# Patient Record
Sex: Male | Born: 1980 | Race: White | Hispanic: No | Marital: Married | Smoking: Former smoker
Health system: Southern US, Community
[De-identification: ages and names within clinical notes are randomized; demographics above are authoritative.]

## PROBLEM LIST (undated history)

## (undated) DIAGNOSIS — J9612 Chronic respiratory failure with hypercapnia: Secondary | ICD-10-CM

## (undated) DIAGNOSIS — I509 Heart failure, unspecified: Secondary | ICD-10-CM

## (undated) DIAGNOSIS — J984 Other disorders of lung: Secondary | ICD-10-CM

## (undated) DIAGNOSIS — Q676 Pectus excavatum: Secondary | ICD-10-CM

## (undated) HISTORY — DX: Chronic respiratory failure with hypercapnia: J96.12

## (undated) HISTORY — PX: PECTUS EXCAVATUM REPAIR: SHX437

## (undated) HISTORY — PX: KNEE SURGERY: SHX244

## (undated) HISTORY — DX: Heart failure, unspecified: I50.9

## (undated) HISTORY — DX: Pectus excavatum: Q67.6

## (undated) HISTORY — DX: Other disorders of lung: J98.4

---
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
## 2009-02-13 ENCOUNTER — Inpatient Hospital Stay (HOSPITAL_COMMUNITY): Admission: EM | Admit: 2009-02-13 | Discharge: 2009-02-15 | Payer: Self-pay | Admitting: Emergency Medicine

## 2009-02-13 ENCOUNTER — Ambulatory Visit: Payer: Self-pay | Admitting: Cardiology

## 2009-02-13 ENCOUNTER — Ambulatory Visit: Payer: Self-pay | Admitting: Pulmonary Disease

## 2009-02-14 ENCOUNTER — Encounter (INDEPENDENT_AMBULATORY_CARE_PROVIDER_SITE_OTHER): Payer: Self-pay | Admitting: Internal Medicine

## 2009-02-15 ENCOUNTER — Encounter: Payer: Self-pay | Admitting: Pulmonary Disease

## 2009-02-20 ENCOUNTER — Ambulatory Visit (HOSPITAL_COMMUNITY): Admission: RE | Admit: 2009-02-20 | Discharge: 2009-02-20 | Payer: Self-pay | Admitting: Internal Medicine

## 2009-02-20 ENCOUNTER — Ambulatory Visit: Payer: Self-pay | Admitting: Cardiology

## 2009-02-23 ENCOUNTER — Telehealth: Payer: Self-pay | Admitting: Cardiology

## 2009-02-23 DIAGNOSIS — J4540 Moderate persistent asthma, uncomplicated: Secondary | ICD-10-CM

## 2009-02-24 ENCOUNTER — Ambulatory Visit: Payer: Self-pay | Admitting: Cardiology

## 2009-02-24 DIAGNOSIS — I272 Pulmonary hypertension, unspecified: Secondary | ICD-10-CM

## 2009-02-24 DIAGNOSIS — R079 Chest pain, unspecified: Secondary | ICD-10-CM | POA: Insufficient documentation

## 2009-02-24 DIAGNOSIS — I509 Heart failure, unspecified: Secondary | ICD-10-CM

## 2009-02-24 DIAGNOSIS — R0902 Hypoxemia: Secondary | ICD-10-CM

## 2009-03-03 ENCOUNTER — Ambulatory Visit: Payer: Self-pay | Admitting: Pulmonary Disease

## 2009-03-03 DIAGNOSIS — J9610 Chronic respiratory failure, unspecified whether with hypoxia or hypercapnia: Secondary | ICD-10-CM | POA: Insufficient documentation

## 2009-03-05 DIAGNOSIS — J984 Other disorders of lung: Secondary | ICD-10-CM

## 2009-03-16 ENCOUNTER — Ambulatory Visit: Payer: Self-pay | Admitting: Pulmonary Disease

## 2009-04-07 ENCOUNTER — Telehealth: Payer: Self-pay | Admitting: Cardiology

## 2009-05-10 ENCOUNTER — Ambulatory Visit: Payer: Self-pay | Admitting: Cardiology

## 2009-05-11 LAB — CONVERTED CEMR LAB
BUN: 13 mg/dL (ref 6–23)
CO2: 35 meq/L — ABNORMAL HIGH (ref 19–32)
Calcium: 8.7 mg/dL (ref 8.4–10.5)
Chloride: 98 meq/L (ref 96–112)
Creatinine, Ser: 0.7 mg/dL (ref 0.4–1.5)
GFR calc non Af Amer: 141.48 mL/min (ref >60)
Glucose, Bld: 93 mg/dL (ref 70–99)
Potassium: 3.6 meq/L (ref 3.5–5.1)
Pro B Natriuretic peptide (BNP): 9 pg/mL (ref 0.0–100.0)
Sodium: 139 meq/L (ref 135–145)

## 2009-06-20 ENCOUNTER — Telehealth: Payer: Self-pay | Admitting: Pulmonary Disease

## 2009-06-28 ENCOUNTER — Encounter: Payer: Self-pay | Admitting: Pulmonary Disease

## 2009-06-28 ENCOUNTER — Telehealth: Payer: Self-pay | Admitting: Pulmonary Disease

## 2009-09-14 ENCOUNTER — Encounter (INDEPENDENT_AMBULATORY_CARE_PROVIDER_SITE_OTHER): Payer: Self-pay | Admitting: *Deleted

## 2009-10-25 ENCOUNTER — Encounter: Payer: Self-pay | Admitting: Cardiology

## 2010-03-03 ENCOUNTER — Encounter: Payer: Self-pay | Admitting: Cardiology

## 2010-04-03 ENCOUNTER — Encounter: Payer: Self-pay | Admitting: Cardiology

## 2010-06-12 ENCOUNTER — Ambulatory Visit: Payer: Self-pay | Admitting: Cardiovascular Disease

## 2010-06-12 ENCOUNTER — Observation Stay (HOSPITAL_COMMUNITY): Admission: EM | Admit: 2010-06-12 | Discharge: 2010-06-13 | Payer: Self-pay | Admitting: Emergency Medicine

## 2010-06-12 ENCOUNTER — Ambulatory Visit: Payer: Self-pay | Admitting: Pulmonary Disease

## 2010-06-13 ENCOUNTER — Telehealth (INDEPENDENT_AMBULATORY_CARE_PROVIDER_SITE_OTHER): Payer: Self-pay | Admitting: *Deleted

## 2010-06-20 ENCOUNTER — Ambulatory Visit: Payer: Self-pay | Admitting: Pulmonary Disease

## 2010-07-16 ENCOUNTER — Ambulatory Visit: Payer: Self-pay | Admitting: Cardiology

## 2010-07-16 ENCOUNTER — Encounter: Payer: Self-pay | Admitting: Cardiology

## 2010-07-16 ENCOUNTER — Ambulatory Visit: Payer: Self-pay

## 2010-07-16 ENCOUNTER — Ambulatory Visit (HOSPITAL_COMMUNITY): Admission: RE | Admit: 2010-07-16 | Discharge: 2010-07-16 | Payer: Self-pay | Admitting: Cardiology

## 2010-08-21 ENCOUNTER — Ambulatory Visit: Payer: Self-pay | Admitting: Pulmonary Disease

## 2010-09-13 ENCOUNTER — Telehealth: Payer: Self-pay | Admitting: Pulmonary Disease

## 2010-09-30 ENCOUNTER — Encounter: Payer: Self-pay | Admitting: Family Medicine

## 2010-10-02 ENCOUNTER — Encounter: Payer: Self-pay | Admitting: Cardiology

## 2010-10-02 ENCOUNTER — Telehealth (INDEPENDENT_AMBULATORY_CARE_PROVIDER_SITE_OTHER): Payer: Self-pay | Admitting: *Deleted

## 2010-10-07 LAB — CONVERTED CEMR LAB
BUN: 13 mg/dL (ref 6–23)
CO2: 35 meq/L — ABNORMAL HIGH (ref 19–32)
Calcium: 8.7 mg/dL (ref 8.4–10.5)
Chloride: 100 meq/L (ref 96–112)
Creatinine, Ser: 0.8 mg/dL (ref 0.4–1.5)
GFR calc non Af Amer: 121.45 mL/min (ref >60)
Glucose, Bld: 96 mg/dL (ref 70–99)
Potassium: 3.7 meq/L (ref 3.5–5.1)
Sodium: 141 meq/L (ref 135–145)

## 2010-10-11 NOTE — Progress Notes (Signed)
Summary: HFU--needs ov in 1 week---appt for 06/20/2010  Phone Note Call from Patient Call back at [facility] Phone [phone]   Caller: Patient Call For: clance Summary of Call: pt needs HFU w/ kc in 1 wk. please advise.  Initial call taken by: Tivis Ringer, CNA,  June 13, 2010 5:26 PM  Follow-up for Phone Call        [facility], pt was recently hospitalized Oct 4 to Oct 5. D/c summary in E-chart state..... DISCHARGE DIAGNOSIS:  Acute asthma exacerbation.   SECONDARY DIAGNOSES:   1. Viral bronchitis.   2. Chronic right-sided congestive heart failure.   3. Mild pulmonary hypertension.   4. History of pectus excavatum,   5. 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  *****Pt needs 1 week f/u appt with you.  No appts avail. You are completely booked up until Oct 28. Please advise.  Thank you.  Aundra Millet Reynolds LPN  June 14, 2010 9:19 AM   Additional Follow-up for Phone Call Additional follow up Details #1::        we can add him on to the afternoon slots? preferrably wed afternoon. Additional Follow-up by: Barbaraann Share MD,  June 14, 2010 4:32 PM    Additional Follow-up for Phone Call Additional follow up Details #2::    scheduled pt for wed 06/20/2010 at 4:15pm..  LMOM informing pt of appt date and time.  Aundra Millet Reynolds LPN  June 14, 2010 4:35 PM

## 2010-10-11 NOTE — Progress Notes (Signed)
Summary: question regarding oxygen  Phone Note Call from Patient Call back at Home Phone [phone]   Caller: Patient Call For: Dr. Shelle Iron Summary of Call: Patient phoned and stated that he just missed a call from [facility]. He can be reached at [phone] Initial call taken by: Vedia Coffer,  September 13, 2010 5:10 PM  Follow-up for Phone Call        called and spoke with pt.  informed him of ONO results and to stay on o2 at 2LPM at night with bipap.  pt verbalized understanding and agreed to do this.  However, pt is concerned about having to pay $200 each month for oxygen tanks and wanted to ask [facility] if he will have to be on the oxygen forever as he cannot continue to pay $200 indefinitely.  Please advise.  Thanks.  Aundra Millet Reynolds LPN  September 13, 2010 5:14 PM   *** pt said we could call him back tomorrow with KC's response  Additional Follow-up for Phone Call Additional follow up Details #1::        I think there is a good chance he may be on this forever due to his restrictive disease.  He may be able to tell dme he doesn;t want backup tanks in his house, and refuse to accept them...they may let him sign a waiver.  If not, let us know Additional Follow-up by: Barbaraann Share MD,  September 13, 2010 5:20 PM    Additional Follow-up for Phone Call Additional follow up Details #2::    Pt informed of above and states he will check into it with the DME or change suppliers if needed. States they told him it was mostly for the concentrator but will check for a breakdown of the supplies. Zackery Barefoot CMA  September 13, 2010 5:30 PM

## 2010-10-11 NOTE — Letter (Signed)
Summary: [facility] Note - Cardiovascular   [facility] Note - Cardiovascular   Imported By: Roderic Ovens 04/20/2010 12:33:19  _____________________________________________________________________  External Attachment:    Type:   Image     Comment:   External Document

## 2010-10-11 NOTE — Consult Note (Signed)
Summary: Consultation Report  Consultation Report   Imported By: [place] Carbo 03/21/2010 08:37:03  _____________________________________________________________________  External Attachment:    Type:   Image     Comment:   External Document

## 2010-10-11 NOTE — Letter (Signed)
Summary: Appointment - Reminder 2  [employer], Main Office  [address]   [place], [place] [address]   Phone: [phone]  Fax: [phone]     September 14, 2009 MRN: [id number]   LINSEY HIROTA [address] [place], [place]  [address]   Dear Mr. Mataya,  Our records indicate that it is time to schedule a follow-up appointment with Dr. Shirlee Latch. It is very important that we reach you to schedule this appointment. We look forward to participating in your health care needs. Please contact us at the number listed above at your earliest convenience to schedule your appointment.  If you are unable to make an appointment at this time, give [place] a call so we can update our records.  Sincerely,   Migdalia Dk [facility] Scheduling Team

## 2010-10-11 NOTE — Progress Notes (Signed)
  Records faxed to [facility] Center/Dr.Schocker @ [phone] [facility]  October 02, 2010 1:58 PM

## 2010-10-11 NOTE — Assessment & Plan Note (Signed)
Summary: EPH/JML   Visit Type:  Follow-up Referring Provider:  Shirlee Latch  CC:  Post-hospital.  History of Present Illness: 30 yo with history of pectus excavatum s/p repair in childhood and redo pectus repair in 6/11 returns to cardiology clinic after hospitalization recently for an asthma exacerbation.  Patient has been doing well since his surgery in 6/11.  He now has reasonably good exercise tolerance and was able to walk about a mile uphill to the stadium at the [facility] game this weekend. He feels like his breathing has been much better since the surgery.  He is working full time and doing pulmonary rehab at the hospital in [place].  He was hospitalized with acute bronchitis and asthma exacerbation in 10/11 but this resolved with steroid treatment.  He is following with Dr. Shelle Iron for Bipap given his chronic hypercarbic respiratory failure.  Morning headaches have resolved on Bipap.   Echo was done today and shows preserved LV systolic function with continued compression of the left atrium between the ascending and descending aorta.  The right ventricle is still moderately dilated with probably mild systolic dysfunction.   ECG:  NSR, LAE, Qs in V1 and V2, poor anterior R wave progression.  Labs (8/10): BNP 9, K 3.6, creatinine 0.7  Current Medications (verified): 1)  Furosemide 40 Mg Tabs (Furosemide) .... Once Daily 2)  Klor-Con M20 20 Meq Cr-Tabs (Potassium Chloride Crys Cr) .... Once Daily 3)  Isosorbide Mononitrate Cr 30 Mg Xr24h-Tab (Isosorbide Mononitrate) .Marland Kitchen.. 1 Once Daily 4)  Pulmicort Flexhaler 180 Mcg/act Aepb (Budesonide) .... 2  Puff Two Times A Day 5)  Metoprolol Tartrate 50 Mg Tabs (Metoprolol Tartrate) .... 1/2 Once Daily 6)  Proventil Hfa 108 (90 Base) Mcg/act Aers (Albuterol Sulfate) .... 2 Puffs Every 4-6 Hrs As Needed  Allergies (verified): No Known Drug Allergies  Past History:  Past Medical History: 1.  Pectus excavatum status post repair in [place] in  childhood and repeat repair at [facility] in 6/11.  Borderline Marfanoid habitus.  2.  Asthma.   3.  CHF:  Patient was admitted in 6/10 with CHF and volume overload.  He was diuresed with IV lasix and felt better.  Echo suggested that the RV was enlarged.  RHC was done (after several doses of IV lasix) showing mean RA 13, RV 38/12, PA 42/23 mean 34, mean PCWP 15.  PVR < 2 WU.  Shunt run showed no O2 step up.  Patient was hypoxic and was started O2.  Cardiac MRI (6/10) showed normal LV size and systolic function EF 62%, D-shaped IV septum, moderate to severe RV dilation with RV EF 37%. The pulmonic valve opened normally.  The heart was shifted leftward (pectus) with compression of the LA between the ascending and descending aorta.  MRA of the chest showed normal pulmonary veins.  CHF was primarily right-sided with mild pulmonary HTN noted.  He is hypoxic, especially at night, so hypoxic pulmonary vasoconstriction could be playing a role.  PCWP was only mildly elevated but I wonder if LA compression could be playing a role.  Repeat echo after pectus surgery in 11/11 showed normal LV systolic function and size with a D-shaped interventricular septum.  The LA was still compressed between ascending and descending aorta.  The RV was moderately dilated with mild systolic dysfunction.   4. Restrictive lung disease probably from compression of L lung. 5. 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  Patient is on Bipap.   Family History: Reviewed history from  02/23/2009 and no changes required. POS for HTN-Mother  Social History: The patient lives in [place] with his wife and son.   He works full time in [employer].   He drinks 1-6 drinks per week, never having more than 3 drinks in 1 evening.  No illicit drug use.  Patient states former smoker. started at age 76.  only smoked socially.  quit smoking at age 76.  Review of Systems       All systems reviewed and negative except  as per HPI.  Vital Signs:  Patient profile:   30 year old male Height:      76 inches Weight:      201 pounds BMI:     24.55 Pulse rate:   67 / minute Pulse rhythm:   regular BP sitting:   110 / 84  (left arm) Cuff size:   large  Vitals Entered By: Vikki Ports (July 16, 2010 10:49 AM)  Physical Exam  General:  Well developed, well nourished, in no acute distress.  Tall.  Neck:  Neck supple, no JVD. No masses, thyromegaly or abnormal cervical nodes. Lungs:  Rare rhonchi.  Heart:  Non-displaced PMI, chest non-tender; regular rate and rhythm, S1, S2 without murmurs, rubs or gallops. Carotid upstroke normal, no bruit. Pedals normal pulses. No edema, no varicosities. Abdomen:  Bowel sounds positive; abdomen soft and non-tender without masses, organomegaly, or hernias noted. No hepatosplenomegaly. Msk:  Pectus excavatum still evident.  No joint hyperextensibility or other sequelae of Marfans. Extremities:  No clubbing or cyanosis. Neurologic:  Alert and oriented x 3. Psych:  Normal affect.   Impression & Recommendations:  Problem # 1:  CONGESTIVE HEART FAILURE, UNSPECIFIED (ICD-428.0) Patient primarily right-sided CHF in 2010, likely due to pulmonary hypertension from a combination of left atrial compression and nocturnal hypoxemia. He is now using Bipap every night.  He had a redo pectus surgery and his symptoms are improved with minimal exertional dyspnea.  I suspect the surgery relieved some of the compression on his lungs but echo does show continued compression of the left atrium, likely between the ascending and descending aorta. The RV was difficult to fully visualized but I suspect that it was moderately dilated with mild systolic dysfunction.  He does not appear volume overloaded on exam.  - Continue Bipap - Continue current Lasix  - Repeat echo in 1-2 years depending on symptoms.   - Given asthma, stop metoprolol and start bisoprolol 2.5 mg daily.   Problem # 2:   PULMONARY HYPERTENSION (ICD-416.8) Patient likely had a combination of secondary pulmonary hypertension from LA compression as well as hypoxemic pulmonary vasoconstriction from nocturnal hypoxemia.   Problem # 3:  RESTRICTIVE LUNG DISEASE (ICD-518.89) Lung disease from restriction due to pectus.  Improvement in this may have led to improved symptoms after surgery.  He has chronic hypercarbic respiratory failure.   Patient Instructions: 1)  Your physician has recommended you make the following change in your medication:  2)  Stop Metoprolol.  3)  Start Bisoprolol  2.5mg  daily---this will be one-half of a 5mg  tablet. 4)  Your physician wants you to follow-up in:  1 year with Dr Shirlee Latch.You will receive a reminder letter in the mail two months in advance. If you don't receive a letter, please call our office to schedule the follow-up appointment. Prescriptions: BISOPROLOL FUMARATE 5 MG TABS (BISOPROLOL FUMARATE) one-half tablet daily  #15 x 11   Entered by:   Katina Dung, RN, BSN  Authorized by:   Marca Ancona, MD   Signed by:   Katina Dung, RN, BSN on 07/16/2010   Method used:   Electronically to        [id number] (retail)       [address]       [place], [place]  [address]       Ph: [phone] or [phone]       Fax: [phone]   RxID:   [id number]

## 2010-10-11 NOTE — Letter (Signed)
Summary: [facility]  [facility] Note   Imported By: Roderic Ovens 11/15/2009 14:48:33  _____________________________________________________________________  External Attachment:    Type:   Image     Comment:   External Document

## 2010-10-11 NOTE — Assessment & Plan Note (Signed)
Summary: post hospital followup after asthma exacerbation   Copy to:  Shirlee Latch Primary Provider/Referring Provider:  [facility]  CC:  Post hospital followup.  Pt states that his breathing has improved.  He states has had some prod cough with minimal clear sputum.Marland Kitchen  History of Present Illness: The pt comes in today for f/u after a recent hospitalization for acute bronchitis with asthma exacerbation.  He has not been seen in almost 1 1/2 years, and has known pectus deformity with chronic hypercarbic respiratory failure and pulmonary htn associated with compressed LA and probable nocturnal hypoxemia.  I had started him on bipap the last visit, and have not seen him since.  He ended up having surgery at [facility] which helped quite a bit.  He thinks that bipap has really helped in terms of sleep, his breathing, and has resolved his am headaches.  His breathing is slowly getting back to baseline.  He has finished his prednisone and abx, and cough is now minimal with no purulence.  He denies significant sob.  He has been wearing bipap religiously, and reports no issues with mask or pressure.  Current Medications (verified): 1)  Furosemide 40 Mg Tabs (Furosemide) .... Once Daily 2)  Klor-Con M20 20 Meq Cr-Tabs (Potassium Chloride Crys Cr) .... Once Daily 3)  Isosorbide Mononitrate Cr 30 Mg Xr24h-Tab (Isosorbide Mononitrate) .Marland Kitchen.. 1 Once Daily 4)  Pulmicort Flexhaler 180 Mcg/act Aepb (Budesonide) .Marland Kitchen.. 1 Puff Two Times A Day 5)  Metoprolol Tartrate 50 Mg Tabs (Metoprolol Tartrate) .... 1/2 Once Daily 6)  Proventil Hfa 108 (90 Base) Mcg/act Aers (Albuterol Sulfate) .... 2 Puffs Every 4-6 Hrs As Needed  Allergies (verified): No Known Drug Allergies  Past History:  Past medical, surgical, family and social histories (including risk factors) reviewed, and no changes noted (except as noted below).  Past Medical History: Reviewed history from 05/10/2009 and no changes required. 1.  Pectus  excavatum status post repair in [place].  Borderline Marfanoid habitus.  2.  Asthma.  Mild, has albuterol inhaler but does not use it.  3.  CHF:  Patient was admitted in 6/10 with CHF and volume overload.  He was diuresed with IV lasix and felt better.  Echo suggested that the RV was enlarged.  RHC was done (after several doses of IV lasix) showing mean RA 13, RV 38/12, PA 42/23 mean 34, mean PCWP 15.  PVR < 2 WU.  Shunt run showed no O2 step up.  Patient was hypoxic and was started O2.  Cardiac MRI (6/10) showed normal LV size and systolic function EF 62%, D-shaped IV septum, moderate to severe RV dilation with RV EF 37%. The pulmonic valve opened normally.  The heart was shifted leftward (pectus) with compression of the LA between the ascending and descending aorta.  MRA of the chest showed normal pulmonary veins.  CHF was primarily right-sided with mild pulmonary HTN noted.  He is hypoxic, especially at night, so hypoxic pulmonary vasoconstriction could be playing a role.  PCWP was only mildly elevated but I wonder if LA compression could be playing a role.   4. Restrictive lung disease probably from compression of L lung. 5. 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  Patient is on Bipap.   Past Surgical History: Reviewed history from 02/23/2009 and no changes required.  Had a chest surgery for pectus excavatum and a  right knee surgery.   Family History: Reviewed history from 02/23/2009 and no changes required. POS for HTN-Mother  Social  History: Reviewed history from 03/03/2009 and no changes required. The patient lives in [place] with his wife and son.   He works full time in [employer].   He drinks 1-6 drinks per week, never having more than 3 drinks in 1 evening.  No illicit drug use.  Patient states former smoker. started at age 26.  only smoked socially.  quit smoking at age 27.  Review of Systems       The patient complains of shortness of  breath with activity, productive cough, non-productive cough, and chest pain.  The patient denies shortness of breath at rest, coughing up blood, irregular heartbeats, acid heartburn, indigestion, loss of appetite, weight change, abdominal pain, difficulty swallowing, sore throat, tooth/dental problems, headaches, nasal congestion/difficulty breathing through nose, sneezing, itching, ear ache, anxiety, depression, hand/feet swelling, joint stiffness or pain, rash, change in color of mucus, and fever.    Vital Signs:  Patient profile:   30 year old male Weight:      198.38 pounds BMI:     24.23 O2 Sat:      96 % on Room air Temp:     98.3 degrees F oral Pulse rate:   80 / minute BP sitting:   130 / 82  (left arm)  Vitals Entered By: Vernie Murders (June 20, 2010 4:01 PM)  O2 Flow:  Room air  Physical Exam  General:  wd male in nad Nose:  no purulence or drainage noted. Mouth:  clear Lungs:  good air movement bilat, no definite crackles. isolated wheeze right mid lung zone. Heart:  rrr Extremities:  no edema or cyanosis noted. Neurologic:  alert and oriented, moves all 4.   Impression & Recommendations:  Problem # 1:  ASTHMA (ICD-493.90) the pt is slowly returning to his normal baseline after a recent hospital admission for acute exacerbation related to acute bronchitis.  He still has mild cough, but no purulence.  He still has an isolated wheeze in right chest, so I have asked him to increase his pulmicort to 2 inhalations two times a day for now (that is actually the recommended therapeutic dose).  I will see him back in 2 mos, and can decrease the dose if he is doing well.  Problem # 2:  RESTRICTIVE LUNG DISEASE (ICD-518.89)  with chronic hypercarbia and significant dyspnea prior to his pectus surgery.  He has been using bipap successfully, and feels that it has really helped him.  His am headaches have resolved.  He is having no issues with his mask or pressure.  He is wearing  oxygen with his bipap nocturnally, and the question is whether he still needs this.  Will let him get over this recent illness, and arrange for ONO off oxygen but on bipap next visit.  I have ok'ed him to return to pulmonary rehab.  Medications Added to Medication List This Visit: 1)  Isosorbide Mononitrate Cr 30 Mg Xr24h-tab (Isosorbide mononitrate) .Marland Kitchen.. 1 once daily 2)  Pulmicort Flexhaler 180 Mcg/act Aepb (Budesonide) .Marland Kitchen.. 1 puff two times a day 3)  Metoprolol Tartrate 50 Mg Tabs (Metoprolol tartrate) .... 1/2 once daily 4)  Proventil Hfa 108 (90 Base) Mcg/act Aers (Albuterol sulfate) .... 2 puffs every 4-6 hrs as needed  Other Orders: Est. Patient Level IV ([address])  Patient Instructions: 1)  increase pulmicort to 2 inhalations am and pm until next visit.  Keep mouth rinsed well. 2)  stay on bipap.  Please call if any issues with tolerance. 3)  followup with me in 2mos, but call if not returning to baseline.  Prescriptions: PROVENTIL HFA 108 (90 BASE) MCG/ACT AERS (ALBUTEROL SULFATE) 2 puffs every 4-6 hrs as needed  #1 x 6   Entered and Authorized by:   Barbaraann Share MD   Signed by:   Barbaraann Share MD on 06/20/2010   Method used:   Electronically to        [id number]       [address]       [place], [place]  [address]       Ph: [phone] or [phone]       Fax: [phone]   RxID:   [id number]

## 2010-10-11 NOTE — Assessment & Plan Note (Signed)
Summary: rov for asthma, RLD   Visit Type:  Follow-up Copy to:  Shirlee Latch Primary Provider/Referring Provider:  [facility]  CC:  2 month follow up. pt states his breathing has been "fine". pt denies any cough. pt c/o occas wheezing. Pt states he uses his bipap everynight x 7-8 hrs night. Pt states he is having no problems with machine. pt needs he needs a new mask. Marland Kitchen  History of Present Illness: the pt comes in today for f/u of his known asthma and restrictive disease related to a pectus deformity.  He continues to wear bipap compliantly, and is doing well with the device.  He has returned to baseline from an asthma standpoint since his hospitalization, and continues on the higher dose of pulmicort as I had recommended.  He denies chest congestion or purulence.  Current Medications (verified): 1)  Furosemide 40 Mg Tabs (Furosemide) .... Once Daily 2)  Klor-Con M20 20 Meq Cr-Tabs (Potassium Chloride Crys Cr) .... Once Daily 3)  Isosorbide Mononitrate Cr 30 Mg Xr24h-Tab (Isosorbide Mononitrate) .Marland Kitchen.. 1 Once Daily 4)  Pulmicort Flexhaler 180 Mcg/act Aepb (Budesonide) .... 2  Puff Two Times A Day 5)  Bisoprolol Fumarate 5 Mg Tabs (Bisoprolol Fumarate) .... One-Half Tablet Daily 6)  Proventil Hfa 108 (90 Base) Mcg/act Aers (Albuterol Sulfate) .... 2 Puffs Every 4-6 Hrs As Needed  Allergies (verified): No Known Drug Allergies  Review of Systems       The patient complains of chest pain and weight change.  The patient denies shortness of breath with activity, shortness of breath at rest, productive cough, non-productive cough, coughing up blood, irregular heartbeats, acid heartburn, indigestion, loss of appetite, abdominal pain, difficulty swallowing, sore throat, tooth/dental problems, headaches, nasal congestion/difficulty breathing through nose, sneezing, itching, ear ache, anxiety, depression, hand/feet swelling, joint stiffness or pain, rash, change in color of mucus, and fever.      Vital Signs:  Patient profile:   30 year old male Height:      76 inches Weight:      206 pounds BMI:     25.17 O2 Sat:      98 % on Room air Temp:     97.7 degrees F oral Pulse rate:   70 / minute BP sitting:   100 / 70  (left arm) Cuff size:   large  Vitals Entered By: Carver Fila (August 21, 2010 9:34 AM)  O2 Flow:  Room air CC: 2 month follow up. pt states his breathing has been "fine". pt denies any cough. pt c/o occas wheezing. Pt states he uses his bipap everynight x 7-8 hrs night. Pt states he is having no problems with machine. pt needs he needs a new mask.  Comments meds and allergies updated Phone number updated Carver Fila  August 21, 2010 9:35 AM    Physical Exam  General:  wd male in nad Nose:  no skin breakdown or pressure necrosis from cpap mask Lungs:  clear to auscultation no significant wheeze or rhonchi Heart:  rrr Extremities:  no edema or cyanosis  Neurologic:  alert and oriented, moves all 4.   Impression & Recommendations:  Problem # 1:  ASTHMA (ICD-493.90) the pt appears to be stable on current regimen.  I have asked him to continue.  Problem # 2:  RESTRICTIVE LUNG DISEASE (ICD-518.89) the pt has done well with bilevel at hs, but needs new supplies and mask.  He also wishes to come off oxygen at hs if able, and I  had told him last visit we would check this once he was more stable.  Will check ONO on room air but on bilevel.  Other Orders: Est. Patient Level III ([address]) DME Referral (DME)  Patient Instructions: 1)  no change in asthma meds 2)  will send an order to your dme for new bipap supplies 3)  will check oxygen level overnight on bipap but off oxygen.  will call you with results 4)  followup with me in 6mos.   Immunization History:  Influenza Immunization History:    Influenza:  historical (06/09/2010)   Appended Document: rov for asthma, RLD ONO on RA but on bipap shows low sat ??58%, and spent 1hr and less than  88%.  Please let pt know that he needs to stay on oxygen with his bipap.Marland KitchenMarland Kitchenprobably at least 2 liters.    Appended Document: rov for asthma, RLD [id number]  Appended Document: rov for asthma, RLD called and spoke with pt.  pt aware of ONO results and verbalized his understanding and will continue on oxygen at 2 LPM along with his bipap

## 2010-11-06 NOTE — Letter (Signed)
Summary: [facility]  [facility]   Imported By: Marylou Mccoy 10/29/2010 13:41:30  _____________________________________________________________________  External Attachment:    Type:   Image     Comment:   External Document

## 2010-11-22 LAB — DIFFERENTIAL
Basophils Absolute: 0 10*3/uL (ref 0.0–0.1)
Basophils Absolute: 0 10*3/uL (ref 0.0–0.1)
Basophils Relative: 0 % (ref 0–1)
Basophils Relative: 0 % (ref 0–1)
Eosinophils Absolute: 0 10*3/uL (ref 0.0–0.7)
Eosinophils Absolute: 0.1 10*3/uL (ref 0.0–0.7)
Eosinophils Relative: 0 % (ref 0–5)
Eosinophils Relative: 0 % (ref 0–5)
Lymphocytes Relative: 4 % — ABNORMAL LOW (ref 12–46)
Lymphocytes Relative: 6 % — ABNORMAL LOW (ref 12–46)
Lymphs Abs: 0.6 10*3/uL — ABNORMAL LOW (ref 0.7–4.0)
Lymphs Abs: 0.8 10*3/uL (ref 0.7–4.0)
Monocytes Absolute: 0.2 10*3/uL (ref 0.1–1.0)
Monocytes Absolute: 0.4 10*3/uL (ref 0.1–1.0)
Monocytes Relative: 2 % — ABNORMAL LOW (ref 3–12)
Monocytes Relative: 2 % — ABNORMAL LOW (ref 3–12)
Neutro Abs: 13.5 10*3/uL — ABNORMAL HIGH (ref 1.7–7.7)
Neutro Abs: 13.7 10*3/uL — ABNORMAL HIGH (ref 1.7–7.7)
Neutrophils Relative %: 92 % — ABNORMAL HIGH (ref 43–77)
Neutrophils Relative %: 94 % — ABNORMAL HIGH (ref 43–77)

## 2010-11-22 LAB — POCT I-STAT, CHEM 8
BUN: 9 mg/dL (ref 6–23)
Calcium, Ion: 1.14 mmol/L (ref 1.12–1.32)
Chloride: 96 mEq/L (ref 96–112)
Creatinine, Ser: 1 mg/dL (ref 0.4–1.5)
Glucose, Bld: 163 mg/dL — ABNORMAL HIGH (ref 70–99)
HCT: 49 % (ref 39.0–52.0)
Hemoglobin: 16.7 g/dL (ref 13.0–17.0)
Potassium: 3.9 mEq/L (ref 3.5–5.1)
Sodium: 141 mEq/L (ref 135–145)
TCO2: 34 mmol/L (ref 0–100)

## 2010-11-22 LAB — POCT CARDIAC MARKERS
CKMB, poc: <1 ng/mL — ABNORMAL LOW (ref 1.0–8.0)
Myoglobin, poc: 68.5 ng/mL (ref 12–200)
Troponin i, poc: <0.05 ng/mL (ref 0.00–0.09)

## 2010-11-22 LAB — CBC
HCT: 44.8 % (ref 39.0–52.0)
HCT: 46.1 % (ref 39.0–52.0)
Hemoglobin: 15.2 g/dL (ref 13.0–17.0)
Hemoglobin: 15.6 g/dL (ref 13.0–17.0)
MCH: 29.5 pg (ref 26.0–34.0)
MCH: 29.9 pg (ref 26.0–34.0)
MCHC: 33.8 g/dL (ref 30.0–36.0)
MCHC: 33.9 g/dL (ref 30.0–36.0)
MCV: 86.8 fL (ref 78.0–100.0)
MCV: 88.5 fL (ref 78.0–100.0)
Platelets: 199 10*3/uL (ref 150–400)
Platelets: 226 10*3/uL (ref 150–400)
RBC: 5.16 MIL/uL (ref 4.22–5.81)
RBC: 5.21 MIL/uL (ref 4.22–5.81)
RDW: 13.1 % (ref 11.5–15.5)
RDW: 13.1 % (ref 11.5–15.5)
WBC: 14.4 10*3/uL — ABNORMAL HIGH (ref 4.0–10.5)
WBC: 14.9 10*3/uL — ABNORMAL HIGH (ref 4.0–10.5)

## 2010-11-22 LAB — BRAIN NATRIURETIC PEPTIDE: Pro B Natriuretic peptide (BNP): <30 pg/mL (ref 0.0–100.0)

## 2010-12-17 LAB — CBC
HCT: 42.6 % (ref 39.0–52.0)
HCT: 43.8 % (ref 39.0–52.0)
HCT: 46.2 % (ref 39.0–52.0)
Hemoglobin: 14.2 g/dL (ref 13.0–17.0)
Hemoglobin: 15.5 g/dL (ref 13.0–17.0)
MCHC: 33 g/dL (ref 30.0–36.0)
MCHC: 33.5 g/dL (ref 30.0–36.0)
MCV: 91.9 fL (ref 78.0–100.0)
MCV: 92 fL (ref 78.0–100.0)
MCV: 93 fL (ref 78.0–100.0)
Platelets: 179 10*3/uL (ref 150–400)
Platelets: 207 10*3/uL (ref 150–400)
Platelets: 219 10*3/uL (ref 150–400)
RBC: 4.71 MIL/uL (ref 4.22–5.81)
RBC: 5.02 MIL/uL (ref 4.22–5.81)
RDW: 14.3 % (ref 11.5–15.5)
WBC: 10.4 10*3/uL (ref 4.0–10.5)
WBC: 8 10*3/uL (ref 4.0–10.5)

## 2010-12-17 LAB — POCT I-STAT 3, VENOUS BLOOD GAS (G3P V)
Acid-Base Excess: 11 mmol/L — ABNORMAL HIGH (ref 0.0–2.0)
Acid-Base Excess: 12 mmol/L — ABNORMAL HIGH (ref 0.0–2.0)
Acid-Base Excess: 13 mmol/L — ABNORMAL HIGH (ref 0.0–2.0)
Acid-Base Excess: 14 mmol/L — ABNORMAL HIGH (ref 0.0–2.0)
Acid-Base Excess: 14 mmol/L — ABNORMAL HIGH (ref 0.0–2.0)
Acid-Base Excess: 14 mmol/L — ABNORMAL HIGH (ref 0.0–2.0)
Bicarbonate: 40.3 mEq/L — ABNORMAL HIGH (ref 20.0–24.0)
Bicarbonate: 41.8 meq/L — ABNORMAL HIGH (ref 20.0–24.0)
Bicarbonate: 42.5 mEq/L — ABNORMAL HIGH (ref 20.0–24.0)
Bicarbonate: 42.6 meq/L — ABNORMAL HIGH (ref 20.0–24.0)
Bicarbonate: 43.1 mEq/L — ABNORMAL HIGH (ref 20.0–24.0)
Bicarbonate: 43.4 meq/L — ABNORMAL HIGH (ref 20.0–24.0)
O2 Saturation: 55 %
O2 Saturation: 59 %
O2 Saturation: 63 %
O2 Saturation: 63 %
O2 Saturation: 63 %
O2 Saturation: 66 %
TCO2: 42 mmol/L (ref 0–100)
TCO2: 44 mmol/L (ref 0–100)
TCO2: 45 mmol/L (ref 0–100)
TCO2: 45 mmol/L (ref 0–100)
TCO2: 45 mmol/L (ref 0–100)
TCO2: 46 mmol/L (ref 0–100)
pCO2, Ven: 67.2 mmHg — ABNORMAL HIGH (ref 45.0–50.0)
pCO2, Ven: 68.8 mmHg — ABNORMAL HIGH (ref 45.0–50.0)
pCO2, Ven: 70.2 mmHg (ref 45.0–50.0)
pCO2, Ven: 70.7 mmHg (ref 45.0–50.0)
pCO2, Ven: 71.3 mmHg (ref 45.0–50.0)
pCO2, Ven: 71.4 mmHg (ref 45.0–50.0)
pH, Ven: 7.376 — ABNORMAL HIGH (ref 7.250–7.300)
pH, Ven: 7.383 — ABNORMAL HIGH (ref 7.250–7.300)
pH, Ven: 7.387 — ABNORMAL HIGH (ref 7.250–7.300)
pH, Ven: 7.39 — ABNORMAL HIGH (ref 7.250–7.300)
pH, Ven: 7.392 — ABNORMAL HIGH (ref 7.250–7.300)
pH, Ven: 7.41 — ABNORMAL HIGH (ref 7.250–7.300)
pO2, Ven: 31 mmHg (ref 30.0–45.0)
pO2, Ven: 33 mmHg (ref 30.0–45.0)
pO2, Ven: 34 mmHg (ref 30.0–45.0)
pO2, Ven: 35 mmHg (ref 30.0–45.0)
pO2, Ven: 35 mmHg (ref 30.0–45.0)
pO2, Ven: 37 mmHg (ref 30.0–45.0)

## 2010-12-17 LAB — POCT I-STAT 3, ART BLOOD GAS (G3+)
Acid-Base Excess: 14 mmol/L — ABNORMAL HIGH (ref 0.0–2.0)
Bicarbonate: 41.5 meq/L — ABNORMAL HIGH (ref 20.0–24.0)
O2 Saturation: 86 %
TCO2: 43 mmol/L (ref 0–100)
pCO2 arterial: 58.4 mmHg (ref 35.0–45.0)
pH, Arterial: 7.46 — ABNORMAL HIGH (ref 7.350–7.450)
pO2, Arterial: 51 mmHg — ABNORMAL LOW (ref 80.0–100.0)

## 2010-12-17 LAB — DIFFERENTIAL
Basophils Absolute: 0 10*3/uL (ref 0.0–0.1)
Basophils Relative: 0 % (ref 0–1)
Eosinophils Absolute: 0 10*3/uL (ref 0.0–0.7)
Eosinophils Relative: 0 % (ref 0–5)
Lymphocytes Relative: 5 % — ABNORMAL LOW (ref 12–46)
Lymphs Abs: 0.4 10*3/uL — ABNORMAL LOW (ref 0.7–4.0)
Monocytes Absolute: 0.2 10*3/uL (ref 0.1–1.0)
Monocytes Relative: 2 % — ABNORMAL LOW (ref 3–12)
Neutro Abs: 7.4 10*3/uL (ref 1.7–7.7)
Neutrophils Relative %: 93 % — ABNORMAL HIGH (ref 43–77)

## 2010-12-17 LAB — CARDIAC PANEL(CRET KIN+CKTOT+MB+TROPI)
CK, MB: 1.4 ng/mL (ref 0.3–4.0)
Relative Index: INVALID (ref 0.0–2.5)
Troponin I: 0.01 ng/mL (ref 0.00–0.06)

## 2010-12-17 LAB — APTT: aPTT: 28 seconds (ref 24–37)

## 2010-12-17 LAB — PROTIME-INR
INR: 1.1 (ref 0.00–1.49)
Prothrombin Time: 14.8 seconds (ref 11.6–15.2)

## 2010-12-17 LAB — BASIC METABOLIC PANEL
BUN: 10 mg/dL (ref 6–23)
BUN: 11 mg/dL (ref 6–23)
CO2: 35 mEq/L — ABNORMAL HIGH (ref 19–32)
Calcium: 8.5 mg/dL (ref 8.4–10.5)
Calcium: 8.5 mg/dL (ref 8.4–10.5)
Chloride: 93 mEq/L — ABNORMAL LOW (ref 96–112)
Chloride: 99 mEq/L (ref 96–112)
Creatinine, Ser: 0.76 mg/dL (ref 0.4–1.5)
Creatinine, Ser: 0.79 mg/dL (ref 0.4–1.5)
GFR calc Af Amer: >60 mL/min (ref >60)
GFR calc Af Amer: >60 mL/min (ref >60)
GFR calc non Af Amer: >60 mL/min (ref >60)
Glucose, Bld: 117 mg/dL — ABNORMAL HIGH (ref 70–99)
Potassium: 3.5 mEq/L (ref 3.5–5.1)
Potassium: 3.9 mEq/L (ref 3.5–5.1)
Sodium: 139 mEq/L (ref 135–145)
Sodium: 141 mEq/L (ref 135–145)

## 2010-12-17 LAB — BRAIN NATRIURETIC PEPTIDE
Pro B Natriuretic peptide (BNP): 32 pg/mL (ref 0.0–100.0)
Pro B Natriuretic peptide (BNP): 49 pg/mL (ref 0.0–100.0)

## 2010-12-17 LAB — CULTURE, BLOOD (ROUTINE X 2): Culture: NO GROWTH

## 2010-12-17 LAB — D-DIMER, QUANTITATIVE: D-Dimer, Quant: 0.5 ug/mL-FEU — ABNORMAL HIGH (ref 0.00–0.48)

## 2011-01-14 ENCOUNTER — Ambulatory Visit (INDEPENDENT_AMBULATORY_CARE_PROVIDER_SITE_OTHER)
Admission: RE | Admit: 2011-01-14 | Discharge: 2011-01-14 | Disposition: A | Discharge status: Home / Self Care | Payer: BC Managed Care – PPO | Source: Ambulatory Visit | Attending: Internal Medicine | Admitting: Internal Medicine

## 2011-01-14 ENCOUNTER — Encounter: Payer: Self-pay | Admitting: Internal Medicine

## 2011-01-14 ENCOUNTER — Ambulatory Visit (INDEPENDENT_AMBULATORY_CARE_PROVIDER_SITE_OTHER): Payer: BC Managed Care – PPO | Admitting: Internal Medicine

## 2011-01-14 ENCOUNTER — Ambulatory Visit (INDEPENDENT_AMBULATORY_CARE_PROVIDER_SITE_OTHER): Payer: BC Managed Care – PPO | Admitting: Adult Health

## 2011-01-14 ENCOUNTER — Encounter: Payer: Self-pay | Admitting: Adult Health

## 2011-01-14 ENCOUNTER — Telehealth: Payer: Self-pay | Admitting: Cardiology

## 2011-01-14 VITALS — BP 120/82 | HR 78 | Temp 98.0°F | Ht 76.0 in | Wt 201.9 lb

## 2011-01-14 DIAGNOSIS — J45909 Unspecified asthma, uncomplicated: Secondary | ICD-10-CM

## 2011-01-14 DIAGNOSIS — R05 Cough: Secondary | ICD-10-CM

## 2011-01-14 DIAGNOSIS — J984 Other disorders of lung: Secondary | ICD-10-CM

## 2011-01-14 MED ORDER — BUDESONIDE-FORMOTEROL FUMARATE 160-4.5 MCG/ACT IN AERO: INHALATION_SPRAY | RESPIRATORY_TRACT | Status: DC

## 2011-01-14 MED ORDER — AMOXICILLIN-POT CLAVULANATE 875-125 MG PO TABS: 1.0000 | ORAL_TABLET | Freq: Two times a day (BID) | ORAL | Status: AC

## 2011-01-14 MED ORDER — PREDNISONE (PAK) 10 MG PO TABS: ORAL_TABLET | ORAL | Status: AC

## 2011-01-14 NOTE — Telephone Encounter (Signed)
lmtcb [place]

## 2011-01-14 NOTE — Assessment & Plan Note (Addendum)
Second flare of apparent asthma this year so breaking the rule of two's and needs step up to combination therapy, at least in the short run.  The proper method of use, as well as anticipated side effects, of this metered-dose inhaler are discussed and demonstrated to the patient. This improved to 75% with coaching > try symbiocort 160 Take 2 puffs first thing in am and then another 2 puffs about 12 hours later.     Add augmentin x 7days for discolored sputum/ ? Rhinitis  and prednisone x 6 day cycle since so marginal due to restrictive component.    Each maintenance medication was reviewed in detail including most importantly the difference between maintenance and as needed and under what circumstances the prns are to be used.  Please see instructions for details which were reviewed in writing and the patient given a copy.

## 2011-01-14 NOTE — Progress Notes (Signed)
  Subjective:    Patient ID: [name], male    DOB: 11-15-1980, 30 y.o.   MRN: [id number]  HPI 29 yowm never with pectus excavatum surgery at age 20 and age 69 at 32 on Bipap chronically since July 2010  01/14/2011 ov able [facility]l [facility] no jogging but able to walk 20 min at at time and really not limited by breathing but in October required admit to [facility] for resp distress/ ? chf rx as asthma and 100% back until 5/5 with onset similar symptoms with stuffy nose, cough sensation of drainage with green mucus self rx with nyquil on maint  2 bid pulmicort and not much better with albuterol.  Pt denies any significant sore throat, dysphagia, itching, sneezing,  nasal congestion or excess/ purulent secretions,  fever, chills, sweats, unintended wt loss, pleuritic or exertional cp, hempoptysis, orthopnea pnd or leg swelling.    Also denies any obvious fluctuation of symptoms with weather or environmental changes or other aggravating or alleviating factors.      Review of Systems     Objective:   Physical Exam    young wm no sob at rest .ant chest wall sinks with insp assoc with a few insp squeaks and exp wheeze bilaterallyt HEENT mild turbinate edema.  Oropharynx no thrush or excess pnd or cobblestoning.  No JVD or cervical adenopathy. Mild accessory muscle hypertrophy. Trachea midline, nl thryroid.  Regular rate and rhythm without murmur gallop or rub or increase P2 or edema.  Abd: no hsm, nl excursion. Ext warm without cyanosis or clubbing.      Assessment & Plan:

## 2011-01-14 NOTE — Assessment & Plan Note (Signed)
Well compensated at hs despite flare of asthma

## 2011-01-14 NOTE — Telephone Encounter (Signed)
Pt calling re zetia rx, needs to verify dosage

## 2011-01-14 NOTE — Patient Instructions (Signed)
symbicort 160  Take 2 puffs first thing in am and then another 2 puffs about 12 hours later.     Augmentin 875 twice daily x 7 days with glass of water should turn mucus back to white  Work on inhaler technique:  relax and gently blow all the way out then take a nice smooth deep breath back in, triggering the inhaler at same time you start breathing in.  Hold for up to 5 seconds if you can.  Rinse and gargle with water when done   If your mouth or throat starts to bother you,   I suggest you time the inhaler to your dental care and after using the inhaler(s) brush teeth and tongue with a baking soda containing toothpaste and when you rinse this out, gargle with it first to see if this helps your mouth and throat.     Prednisone 10 mg take  4 each am x 2 days,   2 each am x 2 days,  1 each am x2days and stop   See Dr Shelle Iron in two weeks to regroup re: maintenance rx.

## 2011-01-15 ENCOUNTER — Ambulatory Visit: Payer: Self-pay | Admitting: Adult Health

## 2011-01-15 NOTE — Telephone Encounter (Signed)
I talked with pt. Pt was questioning dose of Zebeta not Zetia. Dr Shirlee Latch had prescribed Bisoprolol 2.5mg  daily ( 1/2 of a 5mg  tablet) at time of office visit 07/17/11. Pt had seen Dr Meredith Pel at [facility] 10/02/10. Dr Weber Cooks had listed his medication as Bisoprolol 5mg  daily. Pt thinks this was recorded this was because of the way this medication was listed on the med.list he took to the appt with Dr Weber Cooks. Pt states he has always taken Bisoprolol 2.5mg  (1/2 of a 5mg  tablet) . He states Dr Weber Cooks did not mention increasing the Bisoprolol and refilled it according to pt's list. Pt will continue Bisoprolol 2.5 mg daily as he has been doing.

## 2011-01-15 NOTE — Progress Notes (Signed)
  Subjective:    Patient ID: [name], male    DOB: [birth year], 30 y.o.   MRN: [id number]  HPI Opened in errror , seen by wert   Review of Systems     Objective:   Physical Exam        Assessment & Plan:

## 2011-01-16 NOTE — Progress Notes (Signed)
Quick Note:  Spoke with pt and notified of results per Dr. Wert. Pt verbalized understanding and denied any questions.  ______ 

## 2011-01-22 NOTE — Cardiovascular Report (Signed)
[name], [name]  [id number]   MEDICAL RECORD NO.:  [id number]          PATIENT TYPE:  INP   LOCATION:  3709                         FACILITY:  [facility]   PHYSICIAN:  Marca Ancona, MD      DATE OF BIRTH:  06-13-1981   DATE OF PROCEDURE:  DATE OF DISCHARGE:  02/15/2009                            CARDIAC CATHETERIZATION   PROCEDURE:  Right heart catheterization.   INDICATIONS:  1. Congestive heart failure.  2. Question RV failure.  3. Question left-to-right shunt.   PROCEDURE NOTE:  After informed consent was obtained, the right groin  was sterilely prepped and draped.  The right common femoral vein was  entered using Seldinger technique and a 7-French venous sheath was  placed.  The right heart catheterization was carried out using a balloon-  tipped Swan-Ganz catheter.  Samples were removed from the pulmonary  artery, the RV, the RA, and the femoral artery for oxygen saturation.   FINDINGS:  1. Pressures.  Right atrium mean 13 mmHg.  RV 38/12, PA 42/23 with      mean PA pressure 34 mmHg.  Mean pulmonary capillary wedge pressure      15 mmHg.  2. Oxygen saturations.  Mid right atrium 63%, high right atrium 59%,      right ventricle 63%, pulmonary artery 64%, aorta 86%.   IMPRESSION:  There is no step-up in arterial saturations and no  indication of left-to-right shunting.  There is no indication of  pulmonic stenosis.  There is a mildly elevated pulmonary capillary wedge  pressure and moderately elevated right atrial pressure.  There is mild  pulmonary hypertension.  The right atrium and right ventricle are  enlarged and shifted in position due to pectus excavatum.  There does  appear to be a degree of right ventricular failure with elevated right  ventricular filling pressure, also oxygen saturations are low especially  when the patient falls asleep.  I would suggest:  1. Setting up home oxygen.  2. A consult with pulmonology, needs a sleep  study.  3. Follow up with cardiology.  I will see him in 2 weeks.  4. Cardiac MRI as an outpatient prior to appointment with me to assess      his right ventricle.  5. Using Lasix 40 mg daily at home until he sees me again.      Marca Ancona, MD  Electronically Signed     DM/MEDQ  D:  02/15/2009  T:  02/16/2009  Job:  [id number]

## 2011-01-22 NOTE — Discharge Summary (Signed)
[name], [name]  [id number]   MEDICAL RECORD NO.:  [id number]          PATIENT TYPE:  INP   LOCATION:  3709                         FACILITY:  [facility]   PHYSICIAN:  Brian I Elsaid, MD      DATE OF BIRTH:  06-21-1981   DATE OF ADMISSION:  02/13/2009  DATE OF DISCHARGE:  02/15/2009                               DISCHARGE SUMMARY   DISCHARGE DIAGNOSES:  1. Right heart failure with moderately elevated right atrium pressure.  2. Hypoxia, felt to be secondary to right heart failure.  3. Mild pulmonary hypertension.  4. History of pectus incavatum, status post repair.  5. History of asthma.   MEDICATIONS:  1. Lasix 40 mg p.o. daily.  2. Potassium chloride 10 mEq p.o. daily.  3. Imdur 50 mg p.o. daily.  4. Aspirin 81 mg daily.  5. Albuterol and Atrovent inhaler.  6. Avelox 400 mg p.o. daily for 1 week.   PROCEDURE:  1. Status post right heart catheterization.  Result did show no      indication of left-to-right shunting, mild elevated pulmonary wedge      pressure and moderately elevated right atrium pressure.  Mild      pulmonary hypertension, right atrium and right ventricle enlarged.      There is a degree of right ventricular failure with elevated RV      filling pressure and oxygen saturation dropped while the patient is      sleepy.  2. CT angio did show heart is enlarged, pulmonary arteries bilateral,      which is likely aerated pulmonary artery hypertension.  Right      ventricle is enlarged and there may be pulmonary vascular disease.      Patchy bilateral ground-glass airspace may be due to chronic lung      disease or pneumonia.  3. Chest x-ray:  Pneumonia, pectus excavatum deformity.  4. A 2-D echo:  Ejection fraction 55%, right ventricle mildly dilated,      systolic function moderately reduced, diastolic flattening of the      ventricular septum and systolic flattening.  No pericardial      effusion.   HISTORY OF PRESENT ILLNESS:  This  is an 30 year old male with history of  bronchial asthma, history of pectus excavatum, status post repair.  The  patient admitted with shortness of breath and possibility of pneumonia  and right heart failure with possible pulmonary vascular disease and  pulmonary hypertension.   CONSULTATION:  Cardiology consulted and Pulmonary consulted.   The patient was admitted to telemetry, remained on oxygen.  The patient  continued to have hypoxia, but oxygen improved after 2 L of oxygenation.  The patient was started on Lasix and Imdur.  Cardiology consulted.  We  recommended right heart cath.  Right heart cath result as above.  Cardiology recommended the patient to get cardiac MRI as outpatient to  assess the right ventricle and follow up with Dr. Shirlee [name] as outpatient  within 2 weeks.  Also, Pulmonology recommended the patient to be  discharged with home oxygen and both of them recommended  to be followed  as outpatient and is going to be started as outpatient too.  Apparently,  the patient will have setup for home  oxygen.  The patient has hypoxia on room air and accordingly will be  discharged with home oxygen.  At this time, I felt the patient is  medically stable to be discharged.  Follow up with the pulmonologist,  Dr. Marcelyn [name] on June 25.  We will arrange MRI of the heart and need  to follow up with Dr. Shirlee [name] after the MRI of his heart.      Brian Bosie Helper, MD  Electronically Signed     HIE/MEDQ  D:  02/15/2009  T:  02/16/2009  Job:  [id number]

## 2011-01-22 NOTE — H&P (Signed)
[name], [name]  [id number]   MEDICAL RECORD NO.:  [id number]          PATIENT TYPE:  EMS   LOCATION:  MAJO                         FACILITY:  [facility]   PHYSICIAN:  Eduard Clos, MDDATE OF BIRTH:  [birth year]   DATE OF ADMISSION:  02/13/2009  DATE OF DISCHARGE:                              HISTORY & PHYSICAL   PRIMARY CARE PHYSICIAN:  Production assistant, radio.   HISTORY OF PRESENT ILLNESS:  A 30 year old male with a history of  bronchial asthma who usually gets only 1 attack per year.  He is on  albuterol p.r.n.  Presents with ongoing shortness of breath over the  last 1 week.  The patient has been getting short of breath with no cough  or productive sputum.  Denies any fevers or chills.  Has some pleuritic  chest pain when he takes deep breaths.  In the ER, the patient was found  to have a chest x-ray with bilateral infiltrates.  CAT scan confirming  that with some pulmonary hypertension and right ventricular dilatation.  The patient states that he gets short of breath and it is very unusual  for him this time because he is not wheezing.  Denies any fevers or  chills.  Denies any abdominal pain, nausea or vomiting, diarrhea.   PAST MEDICAL HISTORY:  Bronchial asthma.   PAST SURGICAL HISTORY:  Had a chest surgery for pectus excavatum and a  right knee surgery.   MEDICATIONS PRIOR TO ADMISSION:  Albuterol p.r.n.   FAMILY HISTORY:  Nothing significant.   SOCIAL HISTORY:  The patient lives with his girlfriend.  Denies smoking  cigarettes, drinking alcohol, using illicit drugs.   REVIEW OF SYSTEMS:  As per history of present illness.  Nothing else  significant.   PHYSICAL EXAMINATION:  CONSTITUTIONAL:  The patient examined at bedside,  not in acute distress.  VITAL SIGNS:  Blood pressure is 130/80, pulse 70 per minute, temperature  98.2, respirations 18 per minute, O2 saturation 97% on room air.  HEENT:  Anicteric, no pallor.  CHEST:   Bilateral air entry present.  No rhonchi.  No crepitus.  HEART:  S1, S2 heard.  ABDOMEN:  Soft, nontender, bowel sounds heard.  CNS:  Alert and oriented to time, place, and person.  Moves upper and  lower extremities 5/5.  EXTREMITIES:  Peripheral pulses felt.  No edema.   LABORATORY STUDIES:  1. Chest x-ray shows probable right middle lobe and lingular opacities      which may represent pneumonia, given the clinical history, as well      as evaluation because of the patient's pectus excavatum deformity.      Correlation with physical exam helpful.  2. CT angio of chest shows negative for pulmonary embolism.  Heart is      enlarged.  There is enlargement of pulmonary arteries bilaterally,      which is likely related to pulmonary artery hypertension, right      ventricle was enlarged and this may be pulmonary vascular disease.      Patchy bilateral ground-glass airspace disease may be due to  chronic lung disease or pneumonia.  Comparison would be useful.  3. CBC, WBCs 8, hemoglobin 15.5, hematocrit 46.2, platelets 179,000,      neutrophils 98%.  4. PT/INR 14.8 and 1.1.  D-dimer 0.5.  5. Basic metabolic panel sodium 141, potassium 3.9, chloride 99,      carbon dioxide 35, glucose 170, BUN 11, creatinine 0.7, calcium      8.5.   ASSESSMENT:  1. Shortness of breath, probably multifactorial.      a.     Atypical pneumonia.      b.     Right heart failure with possible pulmonary vascular       disease.      c.     Pulmonary hypertension.  2. History of bronchial asthma.  Presently, the patient is not      wheezing.  Looks stable.   PLAN:  1. Will admit the patient to telemetry.  2. Will cycle cardiac markers, get a BNP, check an EKG now and 2D      echo.  3. Will start the patient on empiric antibiotics, at this time      starting Avelox.  Get cultures of both blood and sputum.  4. I have already consulted [facility].  Will follow their      recommendations and  further recommendations will be based on      clinical course and consult recommendations.      Eduard Clos, MD  Electronically Signed     ANK/MEDQ  D:  02/13/2009  T:  02/13/2009  Job:  [id number]

## 2011-01-22 NOTE — Consult Note (Signed)
Brian, [name]  [id number]   MEDICAL RECORD NO.:  [id number]          PATIENT TYPE:  INP   LOCATION:  3709                         FACILITY:  [facility]   PHYSICIAN:  Brian Evener, MD  DATE OF BIRTH:  September 01, 1981   DATE OF CONSULTATION:  02/15/2009  DATE OF DISCHARGE:  02/15/2009                                 CONSULTATION   REQUESTING PHYSICIAN:  [name], Brian Clos, MD   REASON FOR CONSULTATION:  Persistent hypoxia.   HISTORY OF PRESENT ILLNESS:  This is a 30 year old male with a known  history of asthma who presented to the office on June 7 with a 1-week  history of increased shortness of breath and pleuritic chest pain.  He  was found to be severely hypoxic in the office and was sent to emergency  room.  He was admitted on June 7 by [name] with right middle  lobe pneumonia.  The patient continued to be hypoxic requiring  supplemental oxygen.  He was seen in consultation by Cardiology for  questionable pulmonary edema.  There was no improvement with diuresis.  A 2-D echocardiogram and a right heart catheterization were performed  during this admission.  He was found to have a dilated pulmonary artery  and right ventricle per CT scan.  Pulmonary is asked to consult on June  9 for continued hypoxia and asthma.   ALLERGIES:  The patient has no known drug allergies.   REVIEW OF SYSTEMS:  Currently and prior to admission, the patient denied  any fevers, chills, or sweats.  The patient denied any cough or  hemoptysis.  The patient does complain of some headache and nasal  discharge consistent with seasonal allergies.  The patient denied any  paroxysmal nocturnal dyspnea, wheezing, increased lower extremity edema,  palpitations, or orthopnea.  He did complain of again some shortness of  breath and dyspnea on exertion as well as pleuritic chest pain.  A  complete 14-point review of systems was performed and was negative  except for what is indicated.   PAST MEDICAL HISTORY:  The patient has known history of bronchial  asthma, which is well controlled.  He is on no maintenance medications  and has a rare flare approximately yearly related to seasonal allergies.   HOME MEDICATIONS:  The patient is on only albuterol p.r.n.   SOCIAL HISTORY:  The patient is married.  He lives in [place] with  his wife.  He is traveling Medical illustrator.  He has never been a smoker.  No  EtOH use.  Exercises occasionally.   FAMILY HISTORY:  Positive for hypertension in his mother.  No known  history of pulmonary disease.   PHYSICAL EXAMINATION:  VITAL SIGNS:  Temperature 98, blood pressure  113/67, heart rate 78, respiratory rate 16, and saturations 95% on 2  liters nasal cannula.  GENERAL:  The patient is a well-developed, well-nourished male in no  acute distress.  He is pleasant and cooperative.  NEURO:  He is alert and oriented x3.  Cranial nerves are grossly intact.  He moves all extremities.  HEENT:  Mucous membranes are moist.  Pupils are equal, round, and  reactive to light and accommodation.  Extraocular movements are intact.  NECK:  Supple without lymphadenopathy.  No JVD.  CARDIOVASCULAR:  S1 and S2.  Regular rate and rhythm.  No murmurs, rubs,  or gallops.  PULMONARY:  Breaths are even and nonlabored.  Lungs are decreased in  bases with faint expiratory wheezes posteriorly.  ABDOMEN:  Soft,  nontender, and nondistended.  Normal bowel sounds.  No organomegaly.  MUSCULOSKELETAL:  The patient moves all extremities.  No joint  deformities.  No spine or CVA tenderness.  SKIN:  Warm and dry and intact without rashes or lesions.  EXTREMITIES:  Without edema.   RADIOLOGY DATA:  Portable chest x-ray from June 7 shows of right middle  lobe opacities likely consistent with pneumonia.  A CT of the chest from  June 7 is negative for PE, shows cardiomegaly, questionable pulmonary  artery hypertension, dilated right  ventricle, and patchy bilateral  ground-glass opacities.  An echocardiogram performed on June 8 shows an  EF of 55%, shows a dilated right ventricle with moderately reduced  systolic function and a PA pressure of 40.   LABORATORY DATA:  CBC shows a hemoglobin of 14.2, hematocrit 42.6, white  blood cells 10.4, and platelets 219.  Complete metabolic panel; sodium  139, potassium 3.5, BUN 10, creatinine 0.88, and glucose 119.   MICRO DATA:  Blood cultures x2 performed on June 7 preliminarily showed  no growth to date.   IMPRESSION AND PLAN:  A 30 year old male status post repair of pectus  excavatum with profound right heart disease with probable pulmonary  hypertension with or without obstructive sleep apnea.   PLAN:  1. Continue with O2.  This recommendation was made to the patient.      Home health is currently arranging for him to have O2 at home.  It      was stressed to the patient the importance of wearing this      continuously, target O2 saturation of 90% or higher.  2. Outpatient sleep evaluation as well as outpatient pulmonary      evaluation.  This will be done with Dr. Marcelyn [name] with [facility]      Pulmonary Office.  An appointment time was made for June 25 at      11:30 a.m.  This appointment time was given to the patient.   DISPOSITION:  From a pulmonary standpoint, it is fine for the patient to  discharge home today with continuous O2 and with outpatient followup.  Pulmonary Critical Care Medicine will be available p.r.n. as needed.  Thanks very much for the consult.      Brian Dress, NP      Brian Ditch Jefm Miles, MD  Electronically Signed    KW/MEDQ  D:  02/15/2009  T:  02/16/2009  Job:  [id number]   cc:   Brian Share, MD,FCCP  Brian Ancona, MD  Brian Clos, MD

## 2011-01-22 NOTE — Consult Note (Signed)
[name], [name]  [id number]   MEDICAL RECORD NO.:  [id number]          PATIENT TYPE:  INP   LOCATION:  3709                         FACILITY:  [facility]   PHYSICIAN:  Marca Ancona, MD      DATE OF BIRTH:  1980/11/27   DATE OF CONSULTATION:  02/13/2009  DATE OF DISCHARGE:                                 CONSULTATION   PRIMARY CARDIOLOGIST:  Marca Ancona, MD (new).   REASON FOR CONSULTATION:  Pulmonary hypertension on CT angiography.   HISTORY OF PRESENT ILLNESS:  This is a 30 year old male with no known  cardiac history, but with history significant for asthma and pectus  excavatum (s/p surgical repair) presenting with worsening shortness of  breath, chest tightness, dyspnea on exertion, orthopnea, early satiety,  abdominal fullness, and possible pulmonary hypertension on CT  angiography of the chest.  The patient reports roughly 10-pound weight  gain over the last year, but significant symptoms starting about 2 weeks  ago.  The patient denies any recent travel, lower extremity edema, or  pleuritic chest pain.  The patient reports symptoms described above is  significantly different than his prior asthma symptoms.   PAST MEDICAL HISTORY:  1. Asthma.  2. Pectus excavatum (repaired).   SOCIAL HISTORY:  The patient lives in [place] with his wife and son.  He works full time in [employer].  He has no smoking history.  He drinks 3-6 drinks per week, never having more than 3 drinks in 1  evening.  No illicit drug use.  No herbal medication use.  Regular diet.  Exercise include softball twice a week (last week played and had  noticeable shortness of breath/dyspnea on exertion).   FAMILY HISTORY:  Both parents living at age 76.  Mother with  hypertension.  Father with no known medical history.  He has a sister  who is 53 years old with mild hypertension.   REVIEW OF SYSTEMS:  The patient reports one episode of fever last week,  temperature of  101.1, since then fever has resolved.  He also reports  poor appetite recently/early satiety.  He has had intermittent  headaches, but nothing significantly different than prior history.  He  reports chest tightness in the left chest, especially with shortness of  breath, dyspnea on exertion, and orthopnea.  He denies PND, edema,  palpitations, presyncope.  No significant coughing or wheezing recently.  Otherwise, all other systems reviewed and were negative.   ALLERGIES:  NKDA.   HOME MEDICATIONS:  Albuterol.   In the hospital per primary,  1. Zithromax 500 mg p.o. daily.  2. Ceftriaxone 1 g IV x1.  3. Atrovent 0.5 mg p.r.n.   PHYSICAL EXAMINATION:  VITAL SIGNS:  Temperature 98.2 degrees  Fahrenheit, BP 138/81, pulse 70, respiration rate 20, O2 saturation 98%  on 2 L by nasal cannula.  GENERAL:  The patient is alert and oriented x3 in no apparent distress.  He is able to move and speak easily without any respiratory distress.  HEENT:  His head is normocephalic and atraumatic.  Pupils are equal,  round, and  reactive to light.  Extraocular muscles are intact.  Nares  are patent without discharge.  Dentition is good.  Oropharynx without  erythema or exudate.  NECK:  Supple without lymphadenopathy.  No thyromegaly.  No JVP.  Approximately 12 cm worse on the right side.  HEART:  Rate is regular with audible S1 and S2.  No clicks, rubs,  murmurs, or gallops.  Pulses are 2+ and equal in both upper and lower  extremities bilaterally.  LUNGS:  Very mild wheezing in the right upper lobe and mild crackles on  the left base.  SKIN:  No rashes, lesions, or petechiae.  ABDOMEN:  Firm, but nontender, mildly distended.  No rebound or  guarding.  Normal abdominal bowel sounds.  No obvious  hepatosplenomegaly.  Difficult to assess for pulsations secondary to  mild distention, the patient is overweight.  EXTREMITIES:  No clubbing, cyanosis, or edema.  MUSCULOSKELETAL:  Small chest for the  patient's frame; however, no other  deformities or effusions.  No spinal or CVA tenderness.  NEUROLOGIC:  Cranial nerves II through XII are grossly intact.  Strength  was 5/5 in all extremities and axial groups.  Normal sensation  throughout and normal cerebellar function.   RADIOLOGY:  The patient had a chest x-ray that showed probable right  middle lobe and lingular opacities, may represent pneumonia.  The  patient also had a CT angiography of the chest that showed the heart was  enlarged, enlargement of pulmonary arteries bilaterally, which likely  related to pulmonary arterial hypertension, right ventricle also  enlarged.  Patchy bilateral airspace disease as well.   EKG showing sinus rhythm at a rate of 74 with symmetrical T-wave  inversion in inferior leads as well as in V3-V6, questionably  significant Q-waves in V1 and V2.  Left axis deviation.  No evidence of  hypertrophy.  PR 172, QRS 10, QTC 524.  No prior tracing for comparison.   WBC 8.0, HGB 15.5, HCT 46.2, PLT count 179.  Sodium 141, potassium 3.9,  chloride 99, CO2 of 35, BUN 11, creatinine 0.79, glucose is 117.  D-  dimer is 0.50, calcium is 8.5.  PT is 14.8.  INR is 1.1.   ASSESSMENT/PLAN:  This is a 30 year old male with a history of asthma  and repaired pectus excavatum presenting with 2 weeks of steadily  increasing shortness of breath.  The patient states that he has never  had excellent exercise capacity, but over the last 2 weeks, he has noted  shortness of breath with steps and yesterday shortness of breath walking  [place].  No definite orthopnea.  A 10-pound weight  gain over the last several months.  In the ER, had elevated D-dimer and  subsequent CT angio of the chest showed dilated pulmonary artery and  right ventricle.  Chest x-ray with pulmonary edema versus pneumonia.   The patient is volume overloaded on exam, NYHA class III symptoms.  EKG  with diffuse T-wave inversion.  Symptoms  of shortness of breath and  volume overloaded on exam likely secondary to pulmonary hypertension  with RV failure (increased JVP).  However, LV also looks a bit large on  the CTA.  He has a history of pectus excavatum, which can increase the  risk of some form of congenital heart disease.  His exercise tolerance  has apparently worsened.   SUGGESTS:  1. Echocardiogram in a.m.  2. Check BNP and cardiac enzymes.  3. Gentle diuresis, Lasix 20 mg  IV b.i.d., first dose now.  4. Follow up on echo for further recommendations.  5. Continue to treat pneumonia, but based on clinical presentation and      radiology findings symptoms etiology more likely related to      pulmonary edema.   Thank you for the consult.      Jarrett Ables, [facility]      Marca Ancona, MD  Electronically Signed    MS/MEDQ  D:  02/13/2009  T:  02/14/2009  Job:  [id number]

## 2011-01-30 ENCOUNTER — Encounter: Payer: Self-pay | Admitting: Pulmonary Disease

## 2011-01-30 ENCOUNTER — Ambulatory Visit (INDEPENDENT_AMBULATORY_CARE_PROVIDER_SITE_OTHER): Payer: BC Managed Care – PPO | Admitting: Pulmonary Disease

## 2011-01-30 DIAGNOSIS — J45909 Unspecified asthma, uncomplicated: Secondary | ICD-10-CM

## 2011-01-30 DIAGNOSIS — J984 Other disorders of lung: Secondary | ICD-10-CM

## 2011-01-30 DIAGNOSIS — I2789 Other specified pulmonary heart diseases: Secondary | ICD-10-CM

## 2011-01-30 MED ORDER — ALBUTEROL SULFATE HFA 108 (90 BASE) MCG/ACT IN AERS: 2.0000 | INHALATION_SPRAY | Freq: Four times a day (QID) | RESPIRATORY_TRACT | Status: DC | PRN

## 2011-01-30 NOTE — Progress Notes (Signed)
  Subjective:    Patient ID: [name], male    DOB: Jun 06, 1981, 30 y.o.   MRN: [id number]  HPI The pt comes in today for f/u of his known asthma.  He also has restrictive lung disease that requires bilevel at HS.  He was recently seen in the office with an acute exacerbation of his asthma.  He has been maintained on ICS alone, but had his therapy intensified last visit to symbicort.  He has done well with this, and reports a return to his usual baseline.  Denies cough or congestion.  He is wearing his bilevel compliantly without issue.   Review of Systems  Constitutional: Negative for fever and unexpected weight change.  HENT: Positive for sneezing. Negative for ear pain, nosebleeds, congestion, sore throat, rhinorrhea, trouble swallowing, dental problem, postnasal drip and sinus pressure.   Eyes: Negative for redness and itching.  Respiratory: Negative for cough, chest tightness, shortness of breath and wheezing.   Cardiovascular: Negative for palpitations and leg swelling.  Gastrointestinal: Negative for nausea and vomiting.  Genitourinary: Negative for dysuria.  Musculoskeletal: Negative for joint swelling.  Skin: Negative for rash.  Neurological: Negative for headaches.  Hematological: Does not bruise/bleed easily.  Psychiatric/Behavioral: Negative for dysphoric mood. The patient is not nervous/anxious.        Objective:   Physical Exam Wd male in nad Chest is totally clear to auscultation Cor with rrr LE without edema, no cyanosis  Alert, oriented, moves all 4        Assessment & Plan:

## 2011-01-30 NOTE — Patient Instructions (Signed)
Stay on symbicort as you are doing Will call in prescription to refill your rescue inhaler Will check your oxygen level again on bipap but off oxygen.  Will call you with the results.  followup with me in 6mos

## 2011-01-30 NOTE — Assessment & Plan Note (Addendum)
The pt is doing well since adding LABA to his ICS.  He has had 2 flares now in the last 8mos, and therefore agree with leaving him on more aggressive therapy.  He will stay on symbicort and followup with me in 6mos.  He is to call if using his rescue inhaler more than 2 times a week.

## 2011-02-08 NOTE — Assessment & Plan Note (Signed)
The pt continues to do well with bilevel, and wishes to try again to discontinue nocturnal oxygen.  Will check ONO.

## 2011-02-19 ENCOUNTER — Ambulatory Visit: Payer: Self-pay | Admitting: Pulmonary Disease

## 2011-03-08 ENCOUNTER — Telehealth: Payer: Self-pay | Admitting: Pulmonary Disease

## 2011-03-08 MED ORDER — ALBUTEROL SULFATE HFA 108 (90 BASE) MCG/ACT IN AERS: 2.0000 | INHALATION_SPRAY | Freq: Four times a day (QID) | RESPIRATORY_TRACT | Status: DC | PRN

## 2011-03-08 NOTE — Telephone Encounter (Signed)
Spoke with the pt and he states proventil is too expensive and is requesting rx for proair. rx sent. Pt aware.Carron Curie, CMA

## 2011-03-08 NOTE — Telephone Encounter (Signed)
PATIENT RETURNED CALL.  PLEASE CALL BACK AT [phone]

## 2011-03-08 NOTE — Telephone Encounter (Signed)
[email or web address]  

## 2011-07-11 ENCOUNTER — Ambulatory Visit (INDEPENDENT_AMBULATORY_CARE_PROVIDER_SITE_OTHER): Payer: BC Managed Care – PPO | Admitting: Adult Health

## 2011-07-11 ENCOUNTER — Encounter: Payer: Self-pay | Admitting: Adult Health

## 2011-07-11 DIAGNOSIS — J45909 Unspecified asthma, uncomplicated: Secondary | ICD-10-CM

## 2011-07-11 MED ORDER — CEFDINIR 300 MG PO CAPS: 300.0000 mg | ORAL_CAPSULE | Freq: Two times a day (BID) | ORAL | Status: DC

## 2011-07-11 MED ORDER — PREDNISONE 10 MG PO TABS: ORAL_TABLET | ORAL | Status: DC

## 2011-07-11 MED ORDER — HYDROCODONE-HOMATROPINE 5-1.5 MG/5ML PO SYRP: 5.0000 mL | ORAL_SOLUTION | Freq: Four times a day (QID) | ORAL | Status: DC | PRN

## 2011-07-11 NOTE — Patient Instructions (Signed)
Omnicef 300mg  Twice daily  For 7 days  Mucinex DM Twice daily  As needed  Cough/congestion  Prednisone taper over next week.  Hydromet 1-2 tsp every 4-6 hr As needed  Cough/congestion  Please contact office for sooner follow up if symptoms do not improve or worsen or seek emergency care  follow up Dr. Shelle Iron 1 month and As needed

## 2011-07-11 NOTE — Assessment & Plan Note (Signed)
Flare  With URI   Plan : Omnicef 300mg  Twice daily  For 7 days  Mucinex DM Twice daily  As needed  Cough/congestion  Prednisone taper over next week.  Hydromet 1-2 tsp every 4-6 hr As needed  Cough/congestion  Please contact office for sooner follow up if symptoms do not improve or worsen or seek emergency care  follow up Dr. Shelle Iron 1 month and As needed

## 2011-07-11 NOTE — Progress Notes (Deleted)
  Subjective:    Patient ID: [name], male    DOB: 05-15-1981, 30 y.o.   MRN: [id number]  HPI    Review of Systems     Objective:   Physical Exam        Assessment & Plan:

## 2011-07-11 NOTE — Progress Notes (Signed)
  Subjective:    Patient ID: [name], male    DOB: June 15, 1981, 30 y.o.   MRN: [id number]  HPI 30 yowm minimal hx of smoking   with pectus excavatum surgery at age 3 and age 62 at 48 on Bipap chronically since July 2010 and Asthma   01/14/2011 ov able to [facility]l [facility] no jogging but able to walk 20 min at at time and really not limited by breathing but in October required admit to [facility] for resp distress/ ? chf rx as asthma and 100% back until 5/5 with onset similar symptoms with stuffy nose, cough sensation of drainage with green mucus self rx with nyquil on maint  2 bid pulmicort and not much better with albuterol. >>Augmentin and steroid taper   07/11/2011 Acute OV  Complains of Ongoing cough for 4 days - Occas Prod (white -yellow) - Little SOB and wheezing - Chest tightness  Review of Systems Constitutional:   No  weight loss, night sweats,  Fevers, chills, fatigue, or  lassitude.  HEENT:   No headaches,  Difficulty swallowing,  Tooth/dental problems, or  Sore throat,                No sneezing, itching, ear ache, nasal congestion, post nasal drip,   CV:  No chest pain,  Orthopnea, PND, swelling in lower extremities, anasarca, dizziness, palpitations, syncope.   GI  No heartburn, indigestion, abdominal pain, nausea, vomiting, diarrhea, change in bowel habits, loss of appetite, bloody stools.   Resp: No shortness of breath with exertion or at rest.  No excess mucus, no productive cough,  No non-productive cough,  No coughing up of blood.  No change in color of mucus.  No wheezing.  No chest wall deformity  Skin: no rash or lesions.  GU: no dysuria, change in color of urine, no urgency or frequency.  No flank pain, no hematuria   MS:  No joint pain or swelling.  No decreased range of motion.  No back pain.  Psych:  No change in mood or affect. No depression or anxiety.  No memory loss.         Objective:   Physical Exam    young wm no sob at rest .ant chest wall  sinks with insp assoc with a few insp squeaks and exp wheeze bilaterallyt HEENT mild turbinate edema.  Oropharynx no thrush or excess pnd or cobblestoning.  No JVD or cervical adenopathy. Mild accessory muscle hypertrophy. Trachea midline, nl thryroid.  Regular rate and rhythm without murmur gallop or rub or increase P2 or edema.  Abd: no hsm, nl excursion. Ext warm without cyanosis or clubbing.      Assessment & Plan:

## 2011-07-16 ENCOUNTER — Telehealth: Payer: Self-pay | Admitting: Adult Health

## 2011-07-16 NOTE — Telephone Encounter (Signed)
Pt c/o productive cough with white/yellow mucus, SOB same, hot and sweaty, has increased activity. Pt recently seen by TP 07/11/2011 and prescribed Omnicef, pred taper, and Hydromet cough syrup. I advised pt with treatment some pt's get worse before better and abx take time to work. He advised he is out of Hydromet and I suggested Delsym per directions and increase fluids. Pt is still taking Mucinex DM as well. I advised pt to call back on Friday with report and to call back if worse before then or call 911 if office is closed.

## 2011-07-16 NOTE — Progress Notes (Signed)
Ov reviewed, and agree with plan as outlined.  

## 2011-07-19 ENCOUNTER — Encounter: Payer: Self-pay | Admitting: Internal Medicine

## 2011-07-19 ENCOUNTER — Ambulatory Visit (INDEPENDENT_AMBULATORY_CARE_PROVIDER_SITE_OTHER): Payer: BC Managed Care – PPO | Admitting: Internal Medicine

## 2011-07-19 ENCOUNTER — Telehealth: Payer: Self-pay | Admitting: Pulmonary Disease

## 2011-07-19 VITALS — BP 106/70 | HR 85 | Temp 97.9°F | Ht 76.0 in | Wt 210.0 lb

## 2011-07-19 DIAGNOSIS — J45909 Unspecified asthma, uncomplicated: Secondary | ICD-10-CM

## 2011-07-19 MED ORDER — AMOXICILLIN-POT CLAVULANATE 875-125 MG PO TABS: 1.0000 | ORAL_TABLET | Freq: Two times a day (BID) | ORAL | Status: AC

## 2011-07-19 MED ORDER — TRAMADOL HCL 50 MG PO TABS: ORAL_TABLET | ORAL | Status: AC

## 2011-07-19 MED ORDER — PREDNISONE (PAK) 10 MG PO TABS: ORAL_TABLET | ORAL | Status: AC

## 2011-07-19 NOTE — Progress Notes (Signed)
  Subjective:    Patient ID: [name], male    DOB: 01-24-1981, 30 y.o.   MRN: [id number]  HPI 30 yowm minimal hx of smoking   with pectus excavatum surgery at age 19 and age 39 at 64 on Bipap chronically since July 2010 and Asthma   01/14/2011 ov able to tol rehab for seniors no jogging but able to walk 20 min at at time and really not limited by breathing but in October required admit to [facility] for resp distress/ ? chf rx as asthma and 100% back until 5/5 with onset similar symptoms with stuffy nose, cough sensation of drainage with green mucus self rx with nyquil on maint  2 bid pulmicort and not much better with albuterol. >>Augmentin and steroid taper   07/11/2011 Acute OV  Complains of Ongoing cough for 4 days - Occas Prod (white -yellow) - Little SOB and wheezing - Chest tightness rec  Omnicef 300mg  Twice daily  For 7 days  Mucinex DM Twice daily  As needed  Cough/congestion  Prednisone taper over next week.  Hydromet 1-2 tsp every 4-6 hr As needed  Cough/congestion   07/19/2011 f/u ov/Wert cc ok until end of October 2012 and rarely needed proaire, then uri acute onset sore throat with coughing and worse doe  been worse x 2-3 days- prod with yellow/green sputum. Breathing is also worse x several days and now needing rescue saba up to twice daily whereas previously no more than twice weekly while maint on symbicort 160 2bid   Sleeping ok without nocturnal  or early am exacerbation  of respiratory  c/o's or need for noct saba. Also denies any obvious fluctuation of symptoms with weather or environmental changes or other aggravating or alleviating factors except as outlined above    ROS  At present neg for  any significant sore throat, dysphagia, itching, sneezing,  nasal congestion or excess/ purulent secretions,  fever, chills, sweats, unintended wt loss, pleuritic or exertional cp, hempoptysis, orthopnea pnd or leg swelling.  Also denies presyncope, palpitations, heartburn, abdominal  pain, nausea, vomiting, diarrhea  or change in bowel or urinary habits, dysuria,hematuria,  rash, arthralgias, visual complaints, headache, numbness weakness or ataxia.                Objective:   Physical Exam    young wm no sob at rest .ant chest wall sinks with insp assoc with a few insp squeaks and exp wheeze bilaterallyt HEENT mild turbinate edema.  Oropharynx no thrush or excess pnd or cobblestoning.  No JVD or cervical adenopathy. Mild accessory muscle hypertrophy. Trachea midline, nl thryroid.  Regular rate and rhythm without murmur gallop or rub or increase P2 or edema.  Abd: no hsm, nl excursion. Ext warm without cyanosis or clubbing.  Lungs with insp/exp rhonchi sym bilaterally, end exp cough, no bronchial changes    Assessment & Plan:

## 2011-07-19 NOTE — Telephone Encounter (Signed)
I spoke with pt and he states his cough is worse and is getting up yellow phlem, increase SOB. Pt requesting to be seen today. Pt is scheduled to come in and see MW at 11:15

## 2011-07-19 NOTE — Patient Instructions (Signed)
Augmentin x 10 days and if not better call [facility] at [phone] for sinus ct  Prednsione Take 4 for two days three for two days two for two days one for two days   Only use your albuterol (proaire)  as a rescue medication to be used up every 4 hours  if you can't catch your breath by resting or doing a relaxed purse lip breathing pattern. The less you use it, the better it will work when you need it.     Take mucinex dm 2  every 12 hours and supplement if needed with  tramadol 50 mg up to 2 every 4 hours to suppress the urge to cough. Swallowing water or using ice chips/non mint and menthol containing candies (such as lifesavers or sugarless jolly ranchers) are also effective.  You should rest your voice and avoid activities that you know make you cough.  Once you have eliminated the cough for 3 straight days try reducing the tramadol first,  then the mucinex dm as tolerated.   Try prilosec 20mg   Take 30-60 min before first meal of the day and Pepcid 20 mg one bedtime until cough is completely gone for at least a week without the need for cough suppression  I think of reflux for chronic cough like I do oxygen for fire (doesn't cause the fire but once you get the oxygen suppressed it usually goes away regardless of the exact cause).

## 2011-07-20 ENCOUNTER — Encounter: Payer: Self-pay | Admitting: Internal Medicine

## 2011-07-20 NOTE — Assessment & Plan Note (Signed)
Explained natural history of uri and why it's necessary in patients at risk to treat GERD aggressively  at least  short term   to reduce risk of evolving cyclical cough initially  triggered by epithelial injury and a heightened sensitivty to the effects of any upper airway irritants,  most importantly acid - related.  That is, the more sensitive the epithelium damaged for virus, the more the cough, the more the secondary reflux (especially in those prone to reflux) the more the irritation of the sensitive mucosa and so on in a cyclical pattern.   The proper method of use, as well as anticipated side effects, of this metered-dose inhaler are discussed and demonstrated to the patient. Improved to 75% with coaching  See instructions for specific recommendations which were reviewed directly with the patient who was given a copy with highlighter outlining the key components.

## 2011-08-07 ENCOUNTER — Encounter: Payer: Self-pay | Admitting: Pulmonary Disease

## 2011-08-07 ENCOUNTER — Ambulatory Visit (INDEPENDENT_AMBULATORY_CARE_PROVIDER_SITE_OTHER)
Admission: RE | Admit: 2011-08-07 | Discharge: 2011-08-07 | Disposition: A | Discharge status: Home / Self Care | Payer: BC Managed Care – PPO | Source: Ambulatory Visit | Attending: Pulmonary Disease | Admitting: Pulmonary Disease

## 2011-08-07 ENCOUNTER — Ambulatory Visit (INDEPENDENT_AMBULATORY_CARE_PROVIDER_SITE_OTHER): Payer: BC Managed Care – PPO | Admitting: Pulmonary Disease

## 2011-08-07 DIAGNOSIS — R05 Cough: Secondary | ICD-10-CM

## 2011-08-07 DIAGNOSIS — J984 Other disorders of lung: Secondary | ICD-10-CM

## 2011-08-07 DIAGNOSIS — J45909 Unspecified asthma, uncomplicated: Secondary | ICD-10-CM

## 2011-08-07 DIAGNOSIS — R059 Cough, unspecified: Secondary | ICD-10-CM

## 2011-08-07 NOTE — Patient Instructions (Signed)
Take samples of dexilant one each am on empty stomach Take tussicaps one every 12 hrs for next 3 days, continue to limit voice use, use hard candy to bathe back of throat, no throat clearing Will check xray of sinuses, and call you with results. Continue symbicort followup with me in 6mos, but we will arrange earlier followup if needed to get you thru this issue.

## 2011-08-07 NOTE — Assessment & Plan Note (Signed)
The patient has an ongoing cough that is primarily dry, and from his history is more than likely upper airway in origin.  He has been treated with 2 rounds of antibiotics and a course of prednisone without improvement.  Therefore, this is unlikely to be due to his asthma.  I continue to have concerns about reflux disease as well as postnasal drip/sinus disease.  Because of his ongoing hoarseness and nasal voice, as well as sinus congestion, I think we need to do an x-ray of his sinuses.  I've also asked him to continue with his acid reflux medication, as well as his behavioral therapy for chronic cyclical coughing.

## 2011-08-07 NOTE — Assessment & Plan Note (Signed)
The patient is doing very well on bipap, and feels that he sleeps well without increased shortness of breath or increased work of breathing.  He denies any issues with mask fit or pressure.

## 2011-08-07 NOTE — Progress Notes (Signed)
  Subjective:    Patient ID: [name], male    DOB: 04/09/1981, 30 y.o.   MRN: [id number]  HPI The pt comes in today for for an acute sick visit.  He continues to have cough despite treatment with 2 rounds of abx and also prednisone.  He was seen by Dr. Sherene Sires 2 weeks ago, and felt to have an upper airway cough.  The pt continues to have sinus pressure, postnasal drip, and primarily dry cough with occasional discolored mucus.  He has only mild increased sob.  The cough also comes in paroxysms at times.  Denies f/c/s.  He does not feel his asthma is flaring.    Review of Systems  Constitutional: Negative for fever and unexpected weight change.  HENT: Positive for congestion, sore throat, rhinorrhea, postnasal drip and sinus pressure. Negative for ear pain, nosebleeds, sneezing, trouble swallowing and dental problem.   Eyes: Negative for redness and itching.  Respiratory: Positive for cough, chest tightness, shortness of breath and wheezing.   Cardiovascular: Negative for palpitations and leg swelling.  Gastrointestinal: Negative for nausea and vomiting.  Genitourinary: Negative for dysuria.  Musculoskeletal: Negative for joint swelling.  Skin: Negative for rash.  Neurological: Negative for headaches.  Hematological: Does not bruise/bleed easily.  Psychiatric/Behavioral: Negative for dysphoric mood. The patient is not nervous/anxious.        Objective:   Physical Exam Well-developed male in no acute distress Nose with deviated septum to the left, erythematous and edematous nasal mucosa, purulence noted in left nostril. Oropharynx clear without exudates Chest with diffusely transmitted upper airway noise, but good airflow and no true wheezing Cardiac exam with regular rate and rhythm Lower extremities without edema, no cyanosis noted Alert and oriented, moves all 4.         Assessment & Plan:

## 2011-08-08 ENCOUNTER — Other Ambulatory Visit: Payer: Self-pay | Admitting: Pulmonary Disease

## 2011-08-08 MED ORDER — UNABLE TO FIND: Status: DC

## 2011-08-08 MED ORDER — LEVOFLOXACIN 750 MG PO TABS: 750.0000 mg | ORAL_TABLET | Freq: Every day | ORAL | Status: AC

## 2011-08-08 NOTE — Progress Notes (Signed)
Per 11.28.12 CT sinus result note, levaquin 750mg  1 po qd x21 days and neilmed sinus rinse telephoned to [facility] in [place].  Pt verbalized his understanding.

## 2011-08-09 ENCOUNTER — Other Ambulatory Visit: Payer: BC Managed Care – PPO

## 2011-08-09 ENCOUNTER — Telehealth: Payer: Self-pay | Admitting: Pulmonary Disease

## 2011-08-09 ENCOUNTER — Ambulatory Visit: Payer: BC Managed Care – PPO | Admitting: Pulmonary Disease

## 2011-08-09 MED ORDER — HYDROCOD POLST-CPM POLST ER 10-8 MG PO CP12: 1.0000 | ORAL_CAPSULE | Freq: Two times a day (BID) | ORAL | Status: DC | PRN

## 2011-08-09 NOTE — Telephone Encounter (Signed)
Nothing else to add Stay on ABX for now

## 2011-08-09 NOTE — Telephone Encounter (Signed)
Called and spoke with pt.  Informed him of PW's response and rx sent to pharmacy.

## 2011-08-09 NOTE — Telephone Encounter (Signed)
Called and spoke with pt.  Informed him of PW's recs and to continue on abx.  Pt was ok with these recs but wanted to know if he could get something for cough.  States KC gave him 3 days worth of samples of Tussicaps and he will run out of these over the weekend and pt states his cough has worsened since OV.  Pt states while on Tussicaps he couldn't really tell if these were helping his cough or not.  PW, please advise. Thanks. No Known Allergies

## 2011-08-09 NOTE — Telephone Encounter (Signed)
Pt called in to check on the status.  Pt concerned that he has not heard anything back yet.  Please advise.  Antionette Fairy

## 2011-08-09 NOTE — Telephone Encounter (Signed)
Call in tussicaps one bid prn cough #20

## 2011-08-09 NOTE — Telephone Encounter (Signed)
Spoke with pt. He states that since he has started taking levaquin yesterday, he is started to produce large amounts of orange and green sputum. I advised will need to give med time to work, he verbalized understanding, but wants further recs from MD. He is also doing saline nasal rinses bid with no relief. Please advise, thanks! No Known Allergies

## 2011-08-09 NOTE — Telephone Encounter (Signed)
Pt returned call. Please call back [employer]

## 2011-08-09 NOTE — Telephone Encounter (Signed)
LMTCB

## 2011-08-16 ENCOUNTER — Telehealth: Payer: Self-pay | Admitting: Pulmonary Disease

## 2011-08-16 NOTE — Telephone Encounter (Signed)
Patient is aware of KC recs and will go to either an UC or to the ER to have his sxs evaluated and for cxr.

## 2011-08-16 NOTE — Telephone Encounter (Signed)
He may have broken a rib from coughing, or something else may be going on.  Unfortunately it is the weekend, and we have no way of seeing him quickly.   If the pain continues, he will need to go to ER to get cxr to make sure something more serious is not going on .

## 2011-08-16 NOTE — Telephone Encounter (Signed)
Pt c/o sharp pain on right side with cough and deep breaths that radiates to back. Is currently on Dexilant qam, Levaquin 750mg  qd, and Tussicaps bid. Only has 2 days of Tussicaps remaining, did do 3 day cough protocol per [facility] rec's w/o relief. Please advise, thanks.

## 2012-02-05 ENCOUNTER — Ambulatory Visit: Payer: BC Managed Care – PPO | Admitting: Pulmonary Disease

## 2012-02-19 ENCOUNTER — Ambulatory Visit: Payer: BC Managed Care – PPO | Admitting: Pulmonary Disease

## 2012-02-28 ENCOUNTER — Ambulatory Visit (INDEPENDENT_AMBULATORY_CARE_PROVIDER_SITE_OTHER): Payer: BC Managed Care – PPO | Admitting: Pulmonary Disease

## 2012-02-28 ENCOUNTER — Encounter: Payer: Self-pay | Admitting: Pulmonary Disease

## 2012-02-28 VITALS — BP 122/78 | HR 86 | Temp 98.1°F | Ht 76.0 in | Wt 218.2 lb

## 2012-02-28 DIAGNOSIS — J984 Other disorders of lung: Secondary | ICD-10-CM

## 2012-02-28 DIAGNOSIS — R05 Cough: Secondary | ICD-10-CM

## 2012-02-28 DIAGNOSIS — J45909 Unspecified asthma, uncomplicated: Secondary | ICD-10-CM

## 2012-02-28 MED ORDER — HYDROCODONE-ACETAMINOPHEN 5-500 MG PO TABS: 1.0000 | ORAL_TABLET | Freq: Four times a day (QID) | ORAL | Status: AC | PRN

## 2012-02-28 NOTE — Progress Notes (Signed)
  Subjective:    Patient ID: [name], male    DOB: [birth year], 31 y.o.   MRN: [id number]  HPI The patient comes in today for an acute sick visit and also his followup.  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  He is wearing his bilevel consistently, and has no issues with his mask or pressure.  He also has known asthma, and has not been having issues with his breathing, nor has he required excessive rescue inhaler use.  His acute complaint today is that of cough which started approximately 2 weeks ago, and is primarily dry and probably from his upper airway.  He is having significant postnasal drip, but minimal reflux.  He has a history of sinusitis, but does not feel that he is producing purulent mucus.  The cough has led to significant right-sided chest pain that worsens with his cough.  He is not coughing up any purulent mucus from his chest.   Review of Systems  Constitutional: Negative for fever and unexpected weight change.  HENT: Positive for congestion, rhinorrhea and postnasal drip. Negative for ear pain, nosebleeds, sore throat, sneezing, trouble swallowing, dental problem and sinus pressure.   Eyes: Positive for itching. Negative for redness.  Respiratory: Positive for cough, shortness of breath and wheezing. Negative for chest tightness.   Cardiovascular: Positive for chest pain. Negative for palpitations and leg swelling.       Right chest pain from cough  Gastrointestinal: Negative for nausea and vomiting.  Genitourinary: Negative for dysuria.  Musculoskeletal: Negative for joint swelling.  Skin: Negative for rash.  Neurological: Positive for headaches.       Sinus HA  Hematological: Does not bruise/bleed easily.  Psychiatric/Behavioral: Negative for dysphoric mood. The patient is not nervous/anxious.   All other systems reviewed and are negative.       Objective:   Physical Exam Well-developed male in  no acute distress No skin breakdown or pressure necrosis from the CPAP mask No purulence or discharge noted Chest with one isolated wheeze in the left lower lung zone, otherwise good air movement. Cardiac exam with regular rate and rhythm Lower extremities without edema, cyanosis Alert and oriented, moves all 4 extremities.       Assessment & Plan:

## 2012-02-28 NOTE — Assessment & Plan Note (Signed)
The patient feels that he is doing well from an asthma standpoint, has had no issues with shortness of breath or increased rescue inhaler use.  I have asked him to continue on his maintenance medications.

## 2012-02-28 NOTE — Assessment & Plan Note (Signed)
The patient has had a two-week history of increasing cough that I suspect is secondary to postnasal drip and possibly reflux.  He has had issues with acute and chronic sinusitis in the past, but his symptoms currently do not suggest a sinus infection.  I have asked him to call me if he begins to have purulent mucus or significant sinus pressure.  In the meantime, we will treat him more aggressively for postnasal drip, and I have asked him to restart on a proton pump inhibitor at night until he is better.  If the cough persists, he will need a chest x-ray, and possibly treatment for sinusitis or referral to ENT.

## 2012-02-28 NOTE — Assessment & Plan Note (Signed)
The patient has been compliant with his bilevel and oxygen, and feels that he is doing well from this standpoint.  I've asked him to keep up with his supplies and mask changes.

## 2012-02-28 NOTE — Patient Instructions (Addendum)
Take chlorpheniramine 4mg  over the counter 2 near bedtime for postnasal drip.  Can also take one at lunch if needed. Get prilosec 20mg  over the counter and take before dinner for the next 2 weeks to help with any possible reflux. Will give you lortab 500mg  one every 6 hrs if needed for severe chest pain.  Try to take just at night.   No change in asthma medications or your bilevel. followup with me in 6mos, but call if your cough does not improve.

## 2012-06-07 ENCOUNTER — Other Ambulatory Visit: Payer: Self-pay | Admitting: Pulmonary Disease

## 2012-06-08 ENCOUNTER — Ambulatory Visit (INDEPENDENT_AMBULATORY_CARE_PROVIDER_SITE_OTHER): Payer: PRIVATE HEALTH INSURANCE | Admitting: Adult Health

## 2012-06-08 ENCOUNTER — Encounter: Payer: Self-pay | Admitting: Adult Health

## 2012-06-08 ENCOUNTER — Other Ambulatory Visit: Payer: Self-pay | Admitting: Internal Medicine

## 2012-06-08 VITALS — BP 142/84 | HR 75 | Temp 97.1°F | Ht 76.0 in | Wt 222.4 lb

## 2012-06-08 DIAGNOSIS — J45909 Unspecified asthma, uncomplicated: Secondary | ICD-10-CM

## 2012-06-08 MED ORDER — LEVOFLOXACIN 500 MG PO TABS: 500.0000 mg | ORAL_TABLET | Freq: Every day | ORAL | Status: DC

## 2012-06-08 MED ORDER — LEVALBUTEROL HCL 0.63 MG/3ML IN NEBU
0.6300 mg | INHALATION_SOLUTION | Freq: Once | RESPIRATORY_TRACT | Status: AC
  Administered 2012-06-08: 0.63 mg via RESPIRATORY_TRACT

## 2012-06-08 MED ORDER — HYDROCODONE-HOMATROPINE 5-1.5 MG/5ML PO SYRP: 5.0000 mL | ORAL_SOLUTION | Freq: Four times a day (QID) | ORAL | Status: AC | PRN

## 2012-06-08 MED ORDER — PREDNISONE 10 MG PO TABS: ORAL_TABLET | ORAL | Status: DC

## 2012-06-08 NOTE — Patient Instructions (Addendum)
Levaquin 500mg  daily for 10 days  Mucinex DM Twice daily  As needed  Cough/congesiton  Prednisone taper over next week.  Saline nasal rinses As needed   Hydromet 1-2 tsp every 4-6 hr. As needed  Cough -may make you sleepy Please contact office for sooner follow up if symptoms do not improve or worsen or seek emergency care  follow up Dr. Shelle Iron as planned As needed

## 2012-06-08 NOTE — Progress Notes (Signed)
  Subjective:    Patient ID: [name], male    DOB: 01-Oct-1980, 31 y.o.   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   He also has known asthma.   06/08/2012 Acute OV  Complains of prod cough with yellow/green/clear mucus, upper wheezing, head congestion w/ PND and yellow/white mucus, some f/c/s x 3 days.  Has sinus congestion, drainage and ear fullness.  Cough is getting worse.  OTC not helping.  No chest pain, hemoptysis , edema or orthopnea.    Review of Systems Constitutional:   No  weight loss, night sweats,  Fevers, chills,  +fatigue, or  lassitude.  HEENT:   No headaches,  Difficulty swallowing,  Tooth/dental problems, or  Sore throat,                No sneezing, itching, ear ache,  +nasal congestion, post nasal drip,   CV:  No chest pain,  Orthopnea, PND, swelling in lower extremities, anasarca, dizziness, palpitations, syncope.   GI  No heartburn, indigestion, abdominal pain, nausea, vomiting, diarrhea, change in bowel habits, loss of appetite, bloody stools.   Resp:  o coughing up of blood.   Marland Kitchen  No chest wall deformity  Skin: no rash or lesions.  GU: no dysuria, change in color of urine, no urgency or frequency.  No flank pain, no hematuria   MS:  No joint pain or swelling.  No decreased range of motion.  No back pain.  Psych:  No change in mood or affect. No depression or anxiety.  No memory loss.         Objective:   Physical Exam GEN: A/Ox3; pleasant , NAD, well nourished   HEENT:  [place]/AT,  EACs-clear, TMs-wnl, NOSE-clear, max sinus tenderness THROAT-clear, no lesions, no postnasal drip or exudate noted.   NECK:  Supple w/ fair ROM; no JVD; normal carotid impulses w/o bruits; no thyromegaly or nodules palpated; no lymphadenopathy.  RESP  Coarse  BS w/ scattered rhonchi and few wheezes  , no accessory muscle use, no dullness to percussion  CARD:  RRR, no  m/r/g  , no peripheral edema, pulses intact, no cyanosis or clubbing.  GI:   Soft & nt; nml bowel sounds; no organomegaly or masses detected.  Musco: Warm bil, no deformities or joint swelling noted.   Neuro: alert, no focal deficits noted.    Skin: Warm, no lesions or rashes         Assessment & Plan:

## 2012-06-08 NOTE — Addendum Note (Signed)
Addended by: Boone Master E on: 06/08/2012 04:49 PM   Modules accepted: Orders

## 2012-06-08 NOTE — Assessment & Plan Note (Signed)
Asthmatic Bronchitis with associated sinusitis   Plan Levaquin 500mg  daily for 10 days  Mucinex DM Twice daily  As needed  Cough/congesiton  Prednisone taper over next week.  Saline nasal rinses As needed   Hydromet 1-2 tsp every 4-6 hr. As needed  Cough -may make you sleepy Please contact office for sooner follow up if symptoms do not improve or worsen or seek emergency care  follow up Dr. Shelle Iron as planned As needed

## 2012-06-10 ENCOUNTER — Telehealth: Payer: Self-pay | Admitting: Pulmonary Disease

## 2012-06-10 MED ORDER — ALBUTEROL SULFATE HFA 108 (90 BASE) MCG/ACT IN AERS: 2.0000 | INHALATION_SPRAY | Freq: Four times a day (QID) | RESPIRATORY_TRACT | Status: DC | PRN

## 2012-06-10 MED ORDER — TRAMADOL HCL 50 MG PO TABS: 50.0000 mg | ORAL_TABLET | Freq: Four times a day (QID) | ORAL | Status: DC | PRN

## 2012-06-10 NOTE — Telephone Encounter (Signed)
Ok to call in tramadol 50mg , one every 6 hrs if needed for cough. #20, no fills.

## 2012-06-10 NOTE — Telephone Encounter (Signed)
Patient is aware rx sent for his rescue inhaler. Pt is going to check with his insuarnce company ro see which rescue inhaler is cheaper and will callback if different from Proair.

## 2012-06-10 NOTE — Telephone Encounter (Signed)
Per TP OV note 06/08/12: Patient Instructions     Levaquin 500mg  daily for 10 days  Mucinex DM Twice daily As needed Cough/congesiton  Prednisone taper over next week.  Saline nasal rinses As needed  Hydromet 1-2 tsp every 4-6 hr. As needed Cough -may make you sleepy  Please contact office for sooner follow up if symptoms do not improve or worsen or seek emergency care  follow up Dr. Shelle Iron as planned As needed    Per pt the hydromet is making him feel wired. He can;t sleep at all, feels nervous. He also stated it does not really help with his cough. In the past he tried tramadol and this had helped a lot. He would like this called in instead. Please advise Dr. Shelle Iron thanks

## 2012-06-10 NOTE — Telephone Encounter (Signed)
lmomtcb to inform pt rx sent.

## 2012-06-10 NOTE — Telephone Encounter (Signed)
Pt aware of RX directions and this has been sent to the pharmacy. Nothing further needed

## 2012-08-03 ENCOUNTER — Telehealth: Payer: Self-pay | Admitting: Critical Care Medicine

## 2012-08-03 MED ORDER — PREDNISONE 10 MG PO TABS: ORAL_TABLET | ORAL | Status: DC

## 2012-08-03 MED ORDER — LEVOFLOXACIN 500 MG PO TABS: 500.0000 mg | ORAL_TABLET | Freq: Every day | ORAL | Status: DC

## 2012-08-03 NOTE — Telephone Encounter (Signed)
Pt ill again. Coughing up thick yellow mucus and dyspnea. I called in levaquin/prednisone to pharmacy  The pt will need f/u OV soon

## 2012-08-04 ENCOUNTER — Telehealth: Payer: Self-pay | Admitting: Pulmonary Disease

## 2012-08-04 NOTE — Telephone Encounter (Signed)
LMOMTCB x1 for pt 

## 2012-08-04 NOTE — Telephone Encounter (Signed)
That is fine, but if he is not improving with the abx Dr. Delford Field called in, we need to know about it.

## 2012-08-04 NOTE — Telephone Encounter (Signed)
I spoke with pt and he is scheduled to see [facility] 08/13/12 at 11:15. He stated he is not able to have the sinus scan done. heis in b.w jobs and insurance at this time and would not be able to afford this. He stated he would discuss everything with KC at his OV. Will forward to [facility] so he is aware.

## 2012-08-04 NOTE — Telephone Encounter (Signed)
ATC patient at number provided to set up F/U OV per Dr. Delford Field, no answer. LMOMTCB  Alternate number also given on patients VM-- [phone] in which I also tried to call.  LMOMTCB also

## 2012-08-04 NOTE — Telephone Encounter (Signed)
Duplicate message. 

## 2012-08-04 NOTE — Telephone Encounter (Signed)
This pt needs to come in asap.  Let him know that I would also like to scan his sinuses again.  Last year he had terrible sinusitis when he stayed sick for a period of time.  If he has this again, we will need to deal with this issue since it continues to make him sick.  Also, if his sinuses stay infected, his asthma will NOT stay controlled.  Let me know if he is ok with getting sinus xray, and I will put into the computer.

## 2012-08-05 NOTE — Telephone Encounter (Signed)
Pt spoke with Mindy and nothing further is needed.

## 2012-08-13 ENCOUNTER — Encounter: Payer: Self-pay | Admitting: Pulmonary Disease

## 2012-08-13 ENCOUNTER — Ambulatory Visit (INDEPENDENT_AMBULATORY_CARE_PROVIDER_SITE_OTHER): Payer: BC Managed Care – PPO | Admitting: Pulmonary Disease

## 2012-08-13 ENCOUNTER — Other Ambulatory Visit: Payer: BC Managed Care – PPO

## 2012-08-13 ENCOUNTER — Telehealth: Payer: Self-pay | Admitting: *Deleted

## 2012-08-13 VITALS — BP 110/72 | HR 92 | Temp 98.3°F | Ht 76.0 in | Wt 223.6 lb

## 2012-08-13 DIAGNOSIS — J019 Acute sinusitis, unspecified: Secondary | ICD-10-CM | POA: Insufficient documentation

## 2012-08-13 DIAGNOSIS — J45909 Unspecified asthma, uncomplicated: Secondary | ICD-10-CM

## 2012-08-13 DIAGNOSIS — J329 Chronic sinusitis, unspecified: Secondary | ICD-10-CM | POA: Insufficient documentation

## 2012-08-13 DIAGNOSIS — J984 Other disorders of lung: Secondary | ICD-10-CM

## 2012-08-13 LAB — IGG, IGA, IGM
IgA: 317 mg/dL (ref 68–379)
IgM, Serum: 54 mg/dL (ref 41–251)

## 2012-08-13 NOTE — Assessment & Plan Note (Signed)
The pt has been having recurrent exacerbations related to recurrent sinus infections.  At this point, I think he needs to see ENT to see if there is something more definitive that can be done.  I also think he needs RAST testing, along with serum Ig's.  I have asked him to continue on his current meds.

## 2012-08-13 NOTE — Progress Notes (Signed)
  Subjective:    Patient ID: [name], male    DOB: Jan 28, 1981, 31 y.o.   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  He has been staying on bilevel, and doing well with the device.  He has been having recurring sinus infections this fall, and was on abx again around thanksgiving along with prednisone.  He is much improved, with no further purulence, but is still c/o sinus pressure.  No increased sob currently.    Review of Systems  Constitutional: Negative for fever and unexpected weight change.  HENT: Positive for congestion and sinus pressure. Negative for ear pain, nosebleeds, sore throat, rhinorrhea, sneezing, trouble swallowing, dental problem and postnasal drip.   Eyes: Negative for redness and itching.  Respiratory: Positive for cough and chest tightness. Negative for shortness of breath and wheezing.   Cardiovascular: Negative for palpitations and leg swelling.  Gastrointestinal: Negative for nausea and vomiting.  Genitourinary: Negative for dysuria.  Musculoskeletal: Negative for joint swelling.  Skin: Negative for rash.  Neurological: Negative for headaches.  Hematological: Does not bruise/bleed easily.  Psychiatric/Behavioral: Negative for dysphoric mood. The patient is not nervous/anxious.        Objective:   Physical Exam Wd male in nad Nose without purulence or discharge noted. Chest with a few rhonchi, no wheezing or crackles. Cor with rrr LE without edema, no cyanosis Alert and oriented, moves all 4.        Assessment & Plan:

## 2012-08-13 NOTE — Patient Instructions (Addendum)
Will refer to ENT to look at your recurring sinus disease. Will get you a new humidifier for your bilevel machine. Will check bloodwork for allergy and immunodeficiency that may be predisposing you to recurrent sinus infections. followup with me in 4mos if doing well, but call if you are having issues.

## 2012-08-13 NOTE — Assessment & Plan Note (Signed)
The pt needs to see ENT because of ongoing symptoms/flareups.

## 2012-08-13 NOTE — Telephone Encounter (Signed)
Patient seen in office today and had a question as he was walking out the door.  Patient curious if the pulmonary meds he is on or his meds in general could be causing his ADD and anxiety to worsen. He states that he does not feel "normal" and would like to know if any of his medications could be doing this to him. Patient is very anxious and states that he feels he cannot concentrate any more, cannot concentrate on "one thing" for a long period of time.   Dr Shelle Iron please advise if you have any recs for patient or if this needs to be addressed by his PCP. Thanks

## 2012-08-13 NOTE — Assessment & Plan Note (Signed)
He is continuing on bilevel and doing well.

## 2012-08-14 LAB — ALLERGY FULL PROFILE
Allergen, D pternoyssinus,d7: 16.2 kU/L — ABNORMAL HIGH
Alternaria Alternata: <0.1 kU/L
Bahia Grass: 0.64 kU/L — ABNORMAL HIGH
Box Elder IgE: <0.1 kU/L
Cat Dander: 2.42 kU/L — ABNORMAL HIGH
Curvularia lunata: <0.1 kU/L
Elm IgE: <0.1 kU/L
Fescue: 6.48 kU/L — ABNORMAL HIGH
G009 Red Top: 4.71 kU/L — ABNORMAL HIGH
House Dust Hollister: 3.25 kU/L — ABNORMAL HIGH
Sycamore Tree: <0.1 kU/L

## 2012-08-14 NOTE — Telephone Encounter (Signed)
Albuterol can cause some shakiness and nervousness that is typically seen in frail, older women.  I would find it to be unlikely is contributing a lot to his symptoms.  Regardless, he needs to be on these meds for his lung condition, and this is unavoidable.

## 2012-08-14 NOTE — Telephone Encounter (Signed)
Spoke with patient, he is aware of Dr. Lenard Lance recs.  Patient verbalized understanding and nothing further needed at this time.

## 2012-08-17 ENCOUNTER — Telehealth: Payer: Self-pay | Admitting: *Deleted

## 2012-08-17 NOTE — Telephone Encounter (Signed)
He can take zyrtec daily to see if this will help.  But I do think he would benefit from a formal allergy evaluation after the first of the year since he is NOT staying well on a consistent basis.

## 2012-08-17 NOTE — Telephone Encounter (Signed)
Notes Recorded by Barbaraann Share, MD on 08/17/2012 at 8:39 AM Please let pt know that his immune system appears to be normal, but his allergy panel is very abnormal. I think it is worthwhile to see an allergist at least for an initial evaluation. This may help control your asthma and sinus symptoms better. Let me know if he is willing to do this   The pt is aware of lab results per Dr. Shelle Iron and verbalized understanding. He did see Dr. Jearld Fenton and was started on Nasonex 2 sprays in each nostril daily. Pt would like to know if he should begin taking an allergy tablet daily to help with his symptoms. Pt also stared he would like to hold off seeing an allergist until after first of the year. Pls advise.

## 2012-08-17 NOTE — Telephone Encounter (Signed)
LMOMTCB x 1 

## 2012-08-18 MED ORDER — CETIRIZINE HCL 10 MG PO TABS: 10.0000 mg | ORAL_TABLET | Freq: Every day | ORAL | Status: AC

## 2012-08-18 NOTE — Telephone Encounter (Signed)
Pt aware of KC recs for Zyrtec and he asked that we [facility] [place] and this has been done. A reminder has been sent to [facility] for Jan 2014 so pt can be set up with an allergist.

## 2012-08-31 ENCOUNTER — Ambulatory Visit: Payer: BC Managed Care – PPO | Admitting: Pulmonary Disease

## 2012-11-06 ENCOUNTER — Other Ambulatory Visit (INDEPENDENT_AMBULATORY_CARE_PROVIDER_SITE_OTHER): Payer: Self-pay

## 2012-11-06 ENCOUNTER — Ambulatory Visit (INDEPENDENT_AMBULATORY_CARE_PROVIDER_SITE_OTHER): Payer: Self-pay | Admitting: Adult Health

## 2012-11-06 ENCOUNTER — Encounter: Payer: Self-pay | Admitting: Adult Health

## 2012-11-06 ENCOUNTER — Ambulatory Visit (INDEPENDENT_AMBULATORY_CARE_PROVIDER_SITE_OTHER)
Admission: RE | Admit: 2012-11-06 | Discharge: 2012-11-06 | Disposition: A | Discharge status: Home / Self Care | Payer: Self-pay | Source: Ambulatory Visit | Attending: Adult Health | Admitting: Adult Health

## 2012-11-06 VITALS — BP 110/80 | HR 83 | Temp 98.0°F | Ht 76.0 in | Wt 238.0 lb

## 2012-11-06 DIAGNOSIS — R635 Abnormal weight gain: Secondary | ICD-10-CM

## 2012-11-06 DIAGNOSIS — J984 Other disorders of lung: Secondary | ICD-10-CM

## 2012-11-06 DIAGNOSIS — R0989 Other specified symptoms and signs involving the circulatory and respiratory systems: Secondary | ICD-10-CM

## 2012-11-06 DIAGNOSIS — R0600 Dyspnea, unspecified: Secondary | ICD-10-CM

## 2012-11-06 LAB — BASIC METABOLIC PANEL
Calcium: 8.9 mg/dL (ref 8.4–10.5)
Creatinine, Ser: 0.9 mg/dL (ref 0.4–1.5)
GFR: 109.04 mL/min (ref >60.00)
Sodium: 143 mEq/L (ref 135–145)

## 2012-11-06 LAB — BRAIN NATRIURETIC PEPTIDE: Pro B Natriuretic peptide (BNP): 13 pg/mL (ref 0.0–100.0)

## 2012-11-06 MED ORDER — BUDESONIDE-FORMOTEROL FUMARATE 160-4.5 MCG/ACT IN AERO: 2.0000 | INHALATION_SPRAY | Freq: Two times a day (BID) | RESPIRATORY_TRACT | Status: DC

## 2012-11-06 MED ORDER — ALBUTEROL SULFATE HFA 108 (90 BASE) MCG/ACT IN AERS: 2.0000 | INHALATION_SPRAY | Freq: Four times a day (QID) | RESPIRATORY_TRACT | Status: DC | PRN

## 2012-11-06 MED ORDER — FUROSEMIDE 40 MG PO TABS: 40.0000 mg | ORAL_TABLET | Freq: Every day | ORAL | Status: DC

## 2012-11-06 NOTE — Patient Instructions (Addendum)
Take extra Lasix 40mg  daily for next 3 days  Low salt diet  follow up Dr. Shelle Iron in 2 weeks  I will call with labs and xray results.

## 2012-11-06 NOTE — Progress Notes (Signed)
Pt's ov reviewed,. If volume overload is the issue, he needs to followup with his cardiologist for further recommendations.

## 2012-11-06 NOTE — Progress Notes (Signed)
  Subjective:    Patient ID: [name], male    DOB: [birth year], 32 y.o.   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   He also has known asthma.   11/06/2012 Acute OV  Complains of feeling very SOB w all activity for last few wks occuring w Chest tightness w activity--- pt has gained 15 pounds in 2 months, states he feels he is retaining fluid  DOE has been getting progressively worse for last 6 months Weight is trending up for last year.  No fever , discolored mucus, exertional pain, calf pain .  Compliant with BIPAP   Review of Systems  Constitutional:   No  weight loss, night sweats,  Fevers, chills,  +fatigue, or  lassitude.  HEENT:   No headaches,  Difficulty swallowing,  Tooth/dental problems, or  Sore throat,                No sneezing, itching, ear ache,  +nasal congestion, post nasal drip,   CV:  No chest pain,  Orthopnea, PND, swelling in lower extremities, anasarca, dizziness, palpitations, syncope.   GI  No heartburn, indigestion, abdominal pain, nausea, vomiting, diarrhea, change in bowel habits, loss of appetite, bloody stools.   Resp:  No coughing up of blood.   Marland Kitchen  No chest wall deformity  Skin: no rash or lesions.  GU: no dysuria, change in color of urine, no urgency or frequency.  No flank pain, no hematuria   MS:  No joint pain or swelling.  No decreased range of motion.  No back pain.  Psych:  No change in mood or affect. No depression or anxiety.  No memory loss.         Objective:   Physical Exam  GEN: A/Ox3; pleasant , NAD, tall , overweight   HEENT:  [place]/AT,  EACs-clear, TMs-wnl, NOSE-clear, max sinus tenderness THROAT-clear, no lesions, no postnasal drip or exudate noted.   NECK:  Supple w/ fair ROM; no JVD; normal carotid impulses w/o bruits; no thyromegaly or nodules palpated; no lymphadenopathy.  RESP  Diminshed BS in bases no accessory  muscle use, no dullness to percussion  CARD:  RRR, no m/r/g  , tr  peripheral edema, pulses intact, no cyanosis or clubbing., neg homans sign   GI:   Soft & nt; nml bowel sounds; no organomegaly or masses detected.  Musco: Warm bil, no deformities or joint swelling noted.   Neuro: alert, no focal deficits noted.    Skin: Warm, no lesions or rashes         Assessment & Plan:

## 2012-11-06 NOTE — Assessment & Plan Note (Signed)
Flare ? Secondary to volume overload Check xray and labs w/ bnp  After labs may consider aldactone additon.   Plan Take extra Lasix 40mg  daily for next 3 days  Low salt diet  follow up Dr. Shelle Iron in 2 weeks  I will call with labs and xray results.

## 2012-11-06 NOTE — Addendum Note (Signed)
Addended by: Boone Master E on: 11/06/2012 04:56 PM   Modules accepted: Orders

## 2012-11-09 NOTE — Progress Notes (Signed)
Quick Note:  Called spoke with patient, advised of cxr results / recs as stated by TP. Pt verbalized his understanding and denied any questions. ______ 

## 2012-11-13 ENCOUNTER — Other Ambulatory Visit: Payer: Self-pay | Admitting: Pulmonary Disease

## 2012-11-20 ENCOUNTER — Encounter: Payer: Self-pay | Admitting: Pulmonary Disease

## 2012-11-20 ENCOUNTER — Other Ambulatory Visit: Payer: Self-pay | Admitting: Adult Health

## 2012-11-20 ENCOUNTER — Ambulatory Visit (INDEPENDENT_AMBULATORY_CARE_PROVIDER_SITE_OTHER): Payer: Self-pay | Admitting: Pulmonary Disease

## 2012-11-20 ENCOUNTER — Telehealth: Payer: Self-pay | Admitting: Pulmonary Disease

## 2012-11-20 ENCOUNTER — Ambulatory Visit: Payer: Self-pay | Admitting: Pulmonary Disease

## 2012-11-20 ENCOUNTER — Telehealth: Payer: Self-pay | Admitting: *Deleted

## 2012-11-20 VITALS — BP 132/90 | HR 109 | Temp 98.0°F | Ht 76.0 in | Wt 230.4 lb

## 2012-11-20 DIAGNOSIS — J984 Other disorders of lung: Secondary | ICD-10-CM

## 2012-11-20 MED ORDER — AMOXICILLIN-POT CLAVULANATE 875-125 MG PO TABS: 1.0000 | ORAL_TABLET | Freq: Two times a day (BID) | ORAL | Status: DC

## 2012-11-20 MED ORDER — PREDNISONE 10 MG PO TABS: ORAL_TABLET | ORAL | Status: DC

## 2012-11-20 MED ORDER — HYDROCODONE-HOMATROPINE 5-1.5 MG/5ML PO SYRP: 5.0000 mL | ORAL_SOLUTION | Freq: Four times a day (QID) | ORAL | Status: DC | PRN

## 2012-11-20 NOTE — Addendum Note (Signed)
Addended by: Nita Sells on: 11/20/2012 02:43 PM   Modules accepted: Orders

## 2012-11-20 NOTE — Assessment & Plan Note (Signed)
The patient is obviously having an episode of acute on chronic sinusitis, and will need a prolonged course of antibiotics.  He has seen otolaryngology, and they have recommended surgery.  He is unable to do this right now because of the new job, but he will consider it this summer.  I have also reviewed aggressive pulmonary hygiene with him.

## 2012-11-20 NOTE — Progress Notes (Signed)
  Subjective:    Patient ID: [name], male    DOB: June 15, 1981, 32 y.o.   MRN: [id number]  HPI Patient comes in today for an acute sick visit.  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  He also has asthma as well as chronic sinusitis.  He comes in today with a one-week history of worsening nasal congestion, purulent discharge and postnasal drip, as well as cough and chest congestion.  He has had increasing shortness of breath and wheezing.  He has seen otolaryngology for his sinus disease, and surgery has been recommended.  He has not been able to do this because of work.  He denies any fevers, chills, or sweats.  He has been staying compliant on his bilevel.   Review of Systems  Constitutional: Negative for fever and unexpected weight change.  HENT: Positive for congestion, rhinorrhea, sneezing, postnasal drip and sinus pressure. Negative for ear pain, nosebleeds, sore throat, trouble swallowing and dental problem.   Eyes: Negative for redness and itching.  Respiratory: Positive for cough, chest tightness, shortness of breath and wheezing.   Cardiovascular: Negative for palpitations and leg swelling.  Gastrointestinal: Negative for nausea and vomiting.  Genitourinary: Negative for dysuria.  Musculoskeletal: Negative for joint swelling.  Skin: Negative for rash.  Neurological: Positive for headaches.  Hematological: Does not bruise/bleed easily.  Psychiatric/Behavioral: Negative for dysphoric mood. The patient is not nervous/anxious.        Objective:   Physical Exam Well-developed male in no acute distress Nose without purulence or discharge noted, But significant mucosal edema and erythema Oropharynx clear Neck without lymphadenopathy or thyromegaly Chest with diffuse rhonchi, no crackles or wheezes Cardiac exam with regular rate and rhythm, 2/6 systolic murmur Lower extremities with minimal edema, no  cyanosis Alert and oriented, moves all 4 extremities.       Assessment & Plan:

## 2012-11-20 NOTE — Assessment & Plan Note (Signed)
The patient has bilateral rhonchi on exam today, and I suspect is related to his acute on chronic sinusitis.  He is having increased chest congestion and shortness of breath, and therefore we'll treat with a short course of prednisone.

## 2012-11-20 NOTE — Patient Instructions (Addendum)
Will treat with augmentin 875mg  one in am and pm for next 3 weeks Use neilmed sinus rinses am and pm for next one week Prednisone taper as directed.  Stay on symbicort am and pm for your asthma Will send in a cough syrup for you to use short term Will send an order to pulmonary rehab in [place]. followup with me in 4 weeks.

## 2012-11-20 NOTE — Telephone Encounter (Signed)
Patient returned call--states hes very sick. KC aware of situation and requested patient to be doublebooked at 130 today. Patient aware of appt change--aware to arrive to office at 115 to be worked in.

## 2012-11-20 NOTE — Assessment & Plan Note (Signed)
The patient is continuing on his bilevel without issues.

## 2012-11-20 NOTE — Telephone Encounter (Signed)
ATC patient, no answer LMOMTCB 

## 2012-11-20 NOTE — Telephone Encounter (Signed)
Amox 875 #42 Take 1 bid x 3 weeks.  Sent to [facility] [place], [place]  Patient aware.

## 2012-11-20 NOTE — Telephone Encounter (Signed)
LMOMx1---need to try again. Patient missed appt today with KC at 945. KC is wanting to try and get this patient in next available to to f/u.

## 2012-12-10 ENCOUNTER — Telehealth: Payer: Self-pay | Admitting: Pulmonary Disease

## 2012-12-10 NOTE — Telephone Encounter (Signed)
(  continued)  Earlier appt with Dr. Shirlee Latch that what is currently scheduled, 02/03/13.  I asked pt is he requests a sooner date.  Pt states that he did.  Pt states that he advised the person scheduling his appointment that he only has 9 days left of his medication.  Pt states that the scheduler did not offer any alternate solutions to his appt w/ Dr. Shirlee Latch or his medication.  Antionette Fairy

## 2012-12-10 NOTE — Telephone Encounter (Signed)
I spoke with the pt and he states he has an appt with Dr. Shirlee Latch on 02-03-13 but he will run out of meds before then. He has an appt with KC on 12-18-12, I advised to discuss this with KC at OV and he may send in refills to last until appt. Pt has plenty to last until OV. Carron Curie, CMA

## 2012-12-11 ENCOUNTER — Ambulatory Visit: Payer: Self-pay | Admitting: Pulmonary Disease

## 2012-12-18 ENCOUNTER — Ambulatory Visit: Payer: Self-pay | Admitting: Pulmonary Disease

## 2012-12-21 ENCOUNTER — Other Ambulatory Visit: Payer: Self-pay | Admitting: Adult Health

## 2012-12-28 ENCOUNTER — Other Ambulatory Visit: Payer: Self-pay | Admitting: Adult Health

## 2012-12-30 ENCOUNTER — Other Ambulatory Visit: Payer: Self-pay | Admitting: Cardiology

## 2013-01-05 ENCOUNTER — Telehealth: Payer: Self-pay | Admitting: Pulmonary Disease

## 2013-01-05 MED ORDER — AMOXICILLIN-POT CLAVULANATE 875-125 MG PO TABS: 1.0000 | ORAL_TABLET | Freq: Two times a day (BID) | ORAL | Status: DC

## 2013-01-05 NOTE — Telephone Encounter (Signed)
Let him know that he cannot keep taking antibiotics for his recurrent sinus infections.  He is going to develop a resistant organism that will not respond to any abx but IV, and then he will have to go to hospital.  ENT has recommended surgery, and he really needs to consider this so his sinus disease will be treated the right way.  I am ok with calling in abx one more time for this, but he needs to call ENT and make an apptm to be seen.  augmentin 875mg  one bid for 14 days, neilmed sinus rinses.  Would like to hold off on steroids if possible.

## 2013-01-05 NOTE — Telephone Encounter (Signed)
Called spoke with patient Advised of KC's recs as stated below Pt stated that he just started a new job and is not able to have surgery right now as rec'd by ENT He did see ENT on 12.9.13 and was told that unless he is going to have the surgery, that there isn't much reason for him to follow up Augmentin sent to verified pharmacy Pt is asking for a refill on the hycodan cough syrup as he reports not sleeping more than 1 hour at a time Pt is okay with a call back tomorrow morning KC please advise, thanks!

## 2013-01-05 NOTE — Telephone Encounter (Signed)
Called spoke with patient, who c/o head congestion with white/yellow mucus, sinus pressure, PND with sore throat, some wheezing and increased SOB x2-3days with HA at onset.  Symptoms are worse today with addition of chills and sweats.  Patient stated the regimen prescribed at last ov worked well for him and would like these again.  Dr Shelle Iron please advise, thanks!  Last ov w/ KC 3.14.14 [facility] - verified  Per last ov: Patient Instructions    Will treat with augmentin 875mg  one in am and pm for next 3 weeks  Use neilmed sinus rinses am and pm for next one week  Prednisone taper as directed.  Stay on symbicort am and pm for your asthma  Will send in a cough syrup for you to use short term  Will send an order to pulmonary rehab in [place].  followup with me in 4 weeks.

## 2013-01-06 MED ORDER — HYDROCODONE-HOMATROPINE 5-1.5 MG/5ML PO SYRP: 5.0000 mL | ORAL_SOLUTION | Freq: Four times a day (QID) | ORAL | Status: DC | PRN

## 2013-01-06 NOTE — Telephone Encounter (Signed)
Spoke with pt and notified of KC's recs  Pt verbalized understanding and denied any questions Rx was called to pharm for hycodan

## 2013-01-06 NOTE — Telephone Encounter (Signed)
KC, before we call him back again please advise if you want to refill the hycodan for him as he requested, thanks!

## 2013-01-06 NOTE — Telephone Encounter (Signed)
Ok with me, but only 2 ounces and no fills.

## 2013-01-06 NOTE — Telephone Encounter (Signed)
Make sure he understands that if we keep going in this direction he is going to get sicker and end up in hospital and miss A LOT MORE work.

## 2013-02-02 ENCOUNTER — Ambulatory Visit (INDEPENDENT_AMBULATORY_CARE_PROVIDER_SITE_OTHER): Payer: Commercial Managed Care - PPO | Admitting: Cardiology

## 2013-02-02 ENCOUNTER — Encounter: Payer: Self-pay | Admitting: Cardiology

## 2013-02-02 VITALS — BP 122/78 | HR 64 | Ht 76.0 in | Wt 221.0 lb

## 2013-02-02 DIAGNOSIS — I5032 Chronic diastolic (congestive) heart failure: Secondary | ICD-10-CM

## 2013-02-02 DIAGNOSIS — I2789 Other specified pulmonary heart diseases: Secondary | ICD-10-CM

## 2013-02-02 DIAGNOSIS — J984 Other disorders of lung: Secondary | ICD-10-CM

## 2013-02-02 DIAGNOSIS — I509 Heart failure, unspecified: Secondary | ICD-10-CM

## 2013-02-02 NOTE — Patient Instructions (Addendum)
Your physician has requested that you have an echocardiogram. Echocardiography is a painless test that uses sound waves to create images of your heart. It provides your doctor with information about the size and shape of your heart and how well your heart's chambers and valves are working. This procedure takes approximately one hour. There are no restrictions for this procedure.  Your physician wants you to follow-up in: 1 year with Dr Shirlee Latch. (May 2015). You will receive a reminder letter in the mail two months in advance. If you don't receive a letter, please call our office to schedule the follow-up appointment.

## 2013-02-02 NOTE — Progress Notes (Signed)
Patient ID: [name], male   DOB: 03-Feb-1981, 32 y.o.   MRN: [id number] 32 yo with history of pectus excavatum s/p repair in childhood and redo pectus repair in 6/11 returns to cardiology clinic for followup. I have not seen him in a couple of years. His main problems recently have been asthma and sinusitis.  From his description, it sounds like he has chronic sinusitis and is going to see an ENT.  Dr. Shelle Iron treats his respiratory problems.  He has been wearing Bipap at night. He can walk on flat ground and up a gentle hill or stairs without dyspnea.  However, he does get short of breath if he has to carry a load up a hill or up stairs.  He continues with maintenance pulmonary rehab at the hospital in [place].  Oxygen saturation drops with heavy exercise, so he has been using oxygen with heavy exercise at pulmonary rehab.  20 lb weight gain since last appointment.   ECG: NSR, biatrial enlargement, poor anterior R wave progression.   Labs (8/10): BNP 9, K 3.6, creatinine 0.7  Labs (2/14): K 3.8, creatinine 0.8, BNP not elevated   Allergies (verified):  No Known Drug Allergies   Past Medical History:  1. Pectus excavatum status post repair in [place] in childhood and repeat repair at [facility] in 6/11. Borderline Marfanoid habitus.  2. Asthma.  3. CHF: Patient was admitted in 6/10 with CHF and volume overload. He was diuresed with IV lasix and felt better. Echo suggested that the RV was enlarged. RHC was done (after several doses of IV lasix) showing mean RA 13, RV 38/12, PA 42/23 mean 34, mean PCWP 15. PVR < 2 WU. Shunt run showed no O2 step up. Patient was hypoxic and was started O2. Cardiac MRI (6/10) showed normal LV size and systolic function EF 62%, D-shaped IV septum, moderate to severe RV dilation with RV EF 37%. The pulmonic valve opened normally. The heart was shifted leftward (pectus) with compression of the LA between the ascending and descending aorta. MRA of the chest showed normal  pulmonary veins. CHF was primarily right-sided with mild pulmonary HTN noted. He is hypoxic, especially at night, so hypoxic pulmonary vasoconstriction could be playing a role. PCWP was only mildly elevated but I wonder if LA compression could be playing a role. Repeat echo after pectus surgery in 11/11 showed normal LV systolic function and size with a D-shaped interventricular septum. The LA was still compressed between ascending and descending aorta. The RV was moderately dilated with mild systolic dysfunction.  4. 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  5. 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 Patient is on Bipap.   Family History:  POS for HTN-Mother   Social History:  The patient lives in [place] with his wife and son.  He works full time in [employer].  He drinks 1-6 drinks per week, never having more than 3 drinks in 1 evening. No illicit drug use.  Patient states former smoker. started at age 50. only smoked socially. quit smoking at age 14.   Review of Systems  All systems reviewed and negative except as per HPI.  Current Outpatient Prescriptions  Medication Sig Dispense Refill  . albuterol (PROAIR HFA) 108 (90 BASE) MCG/ACT inhaler Inhale 2 puffs into the lungs every 6 (six) hours as needed for wheezing or shortness of breath.  8.5 each  5  . bisoprolol (ZEBETA) 5 MG tablet       .  cetirizine (ZYRTEC) 10 MG tablet Take 1 tablet (10 mg total) by mouth daily.  30 tablet  2  . furosemide (LASIX) 40 MG tablet TAKE 1 TABLET EVERY DAY  30 tablet  2  . isosorbide mononitrate (IMDUR) 30 MG 24 hr tablet TAKE 1 TABLET EVERY DAY  35 tablet  0  . potassium chloride SA (K-DUR,KLOR-CON) 20 MEQ tablet Take 20 mEq by mouth daily.        . budesonide-formoterol (SYMBICORT) 160-4.5 MCG/ACT inhaler Inhale 2 puffs into the lungs 2 (two) times daily.  1 Inhaler  5   No current facility-administered medications  for this visit.    BP 122/78  Pulse 64  Ht 6\' 4"  (1.93 m)  Wt 221 lb (100.245 kg)  BMI 26.91 kg/m2 General: NAD Neck: No JVD, no thyromegaly or thyroid nodule.  Lungs: Clear to auscultation bilaterally with normal respiratory effort. CV: Nondisplaced PMI.  Heart regular S1/S2, no S3/S4, no murmur.  No peripheral edema.  No carotid bruit.  Normal pedal pulses.  Abdomen: Soft, nontender, no hepatosplenomegaly, no distention.  Neurologic: Alert and oriented x 3.  Psych: Normal affect. Extremities: No clubbing or cyanosis.  MSK: Chest wall with residual pectus deformity  Assessment/Plan: 1. CONGESTIVE HEART FAILURE:  Patient presented with primarily right-sided CHF in 2010, likely due to pulmonary hypertension from a combination of left atrial compression and nocturnal hypoxemia. He is now using Bipap every night. He had a redo pectus surgery and his symptoms are improved though he still has dyspnea with heavier exertion. I suspect the surgery relieved some of the compression on his lungs but followup echo did show continued compression of the left atrium, likely between the ascending and descending aorta. The RV was difficult to fully visualized but I suspect that it was moderately dilated with mild systolic dysfunction. He does not appear volume overloaded on exam today.  - Continue Bipap  - Continue current Lasix  - I will get a repeat echo to reassess the RV and get an estimation of PA pressure.   2. PULMONARY HYPERTENSION:  Patient likely had a combination of secondary pulmonary hypertension from LA compression as well as hypoxemic pulmonary vasoconstriction from nocturnal hypoxemia.  As above, will reassess with echo.  3. RESTRICTIVE LUNG DISEASE:  Lung disease from restriction due to pectus. He has had chronic hypercarbic respiratory failure.  This may be the major player in his dyspnea. He has also gained about 20 lbs since last appointment with increased abdominal girth.  I suspect that  this affects his diaphragmatic excursion, which may be especially noticed since he already has chest wall restriction.  I think that weight loss could be very helpful.  We discussed diet and exercise.  He also wants to know if anything else can be done to relieve his chest wall restriction.  He has had 2 operations already.  I suggested that he discuss this with Dr. Shelle Iron but I suspect that further surgery is not going to be helpful.   Marca Ancona 02/03/2013 12:57 PM

## 2013-02-03 ENCOUNTER — Ambulatory Visit: Payer: Self-pay | Admitting: Cardiology

## 2013-02-04 ENCOUNTER — Ambulatory Visit (INDEPENDENT_AMBULATORY_CARE_PROVIDER_SITE_OTHER): Payer: Commercial Managed Care - PPO | Admitting: Pulmonary Disease

## 2013-02-04 ENCOUNTER — Encounter: Payer: Self-pay | Admitting: Pulmonary Disease

## 2013-02-04 VITALS — BP 120/72 | HR 91 | Temp 97.7°F | Ht 76.0 in | Wt 220.4 lb

## 2013-02-04 DIAGNOSIS — J984 Other disorders of lung: Secondary | ICD-10-CM

## 2013-02-04 DIAGNOSIS — J45909 Unspecified asthma, uncomplicated: Secondary | ICD-10-CM

## 2013-02-04 DIAGNOSIS — J4520 Mild intermittent asthma, uncomplicated: Secondary | ICD-10-CM

## 2013-02-04 DIAGNOSIS — J329 Chronic sinusitis, unspecified: Secondary | ICD-10-CM

## 2013-02-04 NOTE — Assessment & Plan Note (Signed)
The patient seems to be doing well from an asthma standpoint, with clear lung fields today.  He has not been over using his rescue inhaler.

## 2013-02-04 NOTE — Progress Notes (Signed)
  Subjective:    Patient ID: [name], male    DOB: February 18, 1981, 32 y.o.   MRN: [id number]  HPI Patient comes in today for followup of his known asthma, as well as restrictive lung disease related to a pectus deformity.  He has been doing fairly well from a breathing standpoint, although he does feel some very mild chest tightness intermittently.  He has not had any active wheezing, increased cough, mucus production.  He feels that his exertional tolerance is at baseline.  He is wearing bilevel compliantly at night, and feels he sleeps well with the device.   Review of Systems  Constitutional: Negative for fever and unexpected weight change.  HENT: Negative for ear pain, nosebleeds, congestion, sore throat, rhinorrhea, sneezing, trouble swallowing, dental problem, postnasal drip and sinus pressure.   Eyes: Positive for redness and itching.  Respiratory: Positive for shortness of breath. Negative for cough, chest tightness and wheezing.   Cardiovascular: Negative for palpitations and leg swelling.  Gastrointestinal: Negative for nausea and vomiting.  Genitourinary: Negative for dysuria.  Musculoskeletal: Negative for joint swelling.  Skin: Negative for rash.  Neurological: Positive for headaches.  Hematological: Does not bruise/bleed easily.  Psychiatric/Behavioral: Negative for dysphoric mood. The patient is not nervous/anxious.        Objective:   Physical Exam Well-developed male in no acute distress Nose without purulence or discharge noted No skin breakdown or pressure necrosis from the CPAP mask Chest totally clear to auscultation except for mildly decreased breath sounds in the left base Cardiac exam with regular rate and rhythm Lower extremities without edema, cyanosis Alert and oriented, moves all 4 extremities.       Assessment & Plan:

## 2013-02-04 NOTE — Patient Instructions (Addendum)
Stay on current asthma medications.  Let me know if symptoms worsen Stay on bilevel, keep up with supplies and cushion changes.  If you feel you are not rested as well in the mornings, or if you feel fatigued in the mornings from a breathing standpoint, let me know and we can check ABG.  followup with me in 4mos.

## 2013-02-04 NOTE — Assessment & Plan Note (Signed)
This has been an ongoing issue causing recurrent exacerbations of his asthma.  Otolaryngology has recommended surgery, and the patient is working on some time away from his job to do this.

## 2013-02-04 NOTE — Assessment & Plan Note (Signed)
The patient is compliant on his bilevel and oxygen, and feels that he sleeps well overall.  If he has increased symptoms at night, we can always check a blood gas first thing in the morning to see if he is continuing to have hypercarbia.

## 2013-02-05 ENCOUNTER — Ambulatory Visit: Payer: Commercial Managed Care - PPO | Admitting: Pulmonary Disease

## 2013-02-17 ENCOUNTER — Ambulatory Visit (HOSPITAL_COMMUNITY): Payer: Commercial Managed Care - PPO | Attending: Cardiology | Admitting: Radiology

## 2013-02-17 DIAGNOSIS — Z87891 Personal history of nicotine dependence: Secondary | ICD-10-CM | POA: Insufficient documentation

## 2013-02-17 DIAGNOSIS — R0602 Shortness of breath: Secondary | ICD-10-CM

## 2013-02-17 DIAGNOSIS — I5032 Chronic diastolic (congestive) heart failure: Secondary | ICD-10-CM | POA: Insufficient documentation

## 2013-02-17 DIAGNOSIS — I2789 Other specified pulmonary heart diseases: Secondary | ICD-10-CM

## 2013-02-17 DIAGNOSIS — I509 Heart failure, unspecified: Secondary | ICD-10-CM | POA: Insufficient documentation

## 2013-02-17 NOTE — Progress Notes (Signed)
Echocardiogram performed.  

## 2013-05-31 ENCOUNTER — Other Ambulatory Visit: Payer: Self-pay | Admitting: Cardiology

## 2013-06-01 ENCOUNTER — Other Ambulatory Visit: Payer: Self-pay

## 2013-06-01 MED ORDER — POTASSIUM CHLORIDE CRYS ER 20 MEQ PO TBCR: 20.0000 meq | EXTENDED_RELEASE_TABLET | Freq: Every day | ORAL | Status: DC

## 2013-06-08 ENCOUNTER — Encounter: Payer: Self-pay | Admitting: Pulmonary Disease

## 2013-06-08 ENCOUNTER — Ambulatory Visit (INDEPENDENT_AMBULATORY_CARE_PROVIDER_SITE_OTHER): Payer: Commercial Managed Care - PPO | Admitting: Pulmonary Disease

## 2013-06-08 VITALS — BP 124/78 | HR 97 | Temp 97.6°F | Ht 76.0 in | Wt 223.4 lb

## 2013-06-08 DIAGNOSIS — J45909 Unspecified asthma, uncomplicated: Secondary | ICD-10-CM

## 2013-06-08 DIAGNOSIS — Z23 Encounter for immunization: Secondary | ICD-10-CM

## 2013-06-08 DIAGNOSIS — J984 Other disorders of lung: Secondary | ICD-10-CM

## 2013-06-08 NOTE — Patient Instructions (Addendum)
Will give you the flu shot and pneumovax today No change in meds. Stay on your bipap, and keep up with mask changes and supplies. followup with me in 6mos if doing well.

## 2013-06-08 NOTE — Assessment & Plan Note (Signed)
The patient is doing well on bilevel, and is having no issues with his mask fit or pressure.

## 2013-06-08 NOTE — Progress Notes (Signed)
  Subjective:    Patient ID: [name], male    DOB: 02-25-1981, 32 y.o.   MRN: [id number]  HPI Patient comes in today for followup of his known asthma, as well as restrictive lung disease for which he is on bilevel.  He has done well from an asthma standpoint, and has not had an acute exacerbation or required his rescue inhaler.  His sinuses have been well controlled.  He is wearing his bilevel compliant, and is having no issues with his mask or pressure.  He feels that he sleeps well with the device.   Review of Systems  Constitutional: Negative for fever and unexpected weight change.  HENT: Negative for ear pain, nosebleeds, congestion, sore throat, rhinorrhea, sneezing, trouble swallowing, dental problem, postnasal drip and sinus pressure.   Eyes: Negative for redness and itching.  Respiratory: Positive for shortness of breath. Negative for cough, chest tightness and wheezing.   Cardiovascular: Negative for palpitations and leg swelling.  Gastrointestinal: Negative for nausea and vomiting.  Genitourinary: Negative for dysuria.  Musculoskeletal: Negative for joint swelling.  Skin: Negative for rash.  Neurological: Negative for headaches.  Hematological: Does not bruise/bleed easily.  Psychiatric/Behavioral: Negative for dysphoric mood. The patient is not nervous/anxious.        Objective:   Physical Exam Well-developed male in no acute distress Nose without purulence or discharge noted Neck without lymphadenopathy or thyromegaly Chest totally clear to auscultation, no wheezes Cardiac exam was regular rate and rhythm Lower extremities without edema, cyanosis Alert and oriented, moves all 4 extremities.       Assessment & Plan:

## 2013-06-08 NOTE — Assessment & Plan Note (Signed)
The patient is doing very well since the last visit from an asthma standpoint.  He has not required his rescue inhaler, and has had no further acute exacerbations.  I have reminded him that he usually flares related to acute on chronic sinusitis, but he feels that his sinus disease has been well controlled so far.  He is decided to put off sinus surgery until he has more issues.

## 2013-06-08 NOTE — Addendum Note (Signed)
Addended by: Gwynneth Aliment A on: 06/08/2013 12:15 PM   Modules accepted: Orders

## 2013-07-09 ENCOUNTER — Telehealth: Payer: Self-pay | Admitting: Pulmonary Disease

## 2013-07-09 MED ORDER — PREDNISONE 10 MG PO TABS: ORAL_TABLET | ORAL | Status: DC

## 2013-07-09 NOTE — Telephone Encounter (Signed)
Prednisone 10 mg take  4 each am x 2 days,   2 each am x 2 days,  1 each am x 2 days and stop but needs ov next week to regroup with [facility] or Tammy NP as can't use symbicort effectively

## 2013-07-09 NOTE — Telephone Encounter (Signed)
rx sent and appt set for Monday at 11:45. Carron Curie, CMA

## 2013-07-09 NOTE — Telephone Encounter (Signed)
Called and spoke with pt and he stated that he has been having some increased in SOB at times. Pt stated that he is not really coughing that much and he is waking up in the mornings with a headache.  He stated that he is using the proair as needed but did stop the symbicort since this medication was giving him mouth sores.  Pt is requesting recs on what else he can do for this.  KC is out of the office today.  MW please advise. Thanks  No Known Allergies

## 2013-07-09 NOTE — Telephone Encounter (Signed)
[id number] to set appt. KC has 2 available on Monday. Also need to verify pharmacy. Carron Curie, CMA

## 2013-07-12 ENCOUNTER — Encounter: Payer: Self-pay | Admitting: Pulmonary Disease

## 2013-07-12 ENCOUNTER — Ambulatory Visit (INDEPENDENT_AMBULATORY_CARE_PROVIDER_SITE_OTHER): Payer: Commercial Managed Care - PPO | Admitting: Pulmonary Disease

## 2013-07-12 VITALS — BP 128/78 | HR 90 | Temp 98.1°F | Ht 76.0 in | Wt 234.8 lb

## 2013-07-12 DIAGNOSIS — R0600 Dyspnea, unspecified: Secondary | ICD-10-CM

## 2013-07-12 DIAGNOSIS — I509 Heart failure, unspecified: Secondary | ICD-10-CM

## 2013-07-12 DIAGNOSIS — R0609 Other forms of dyspnea: Secondary | ICD-10-CM

## 2013-07-12 DIAGNOSIS — J984 Other disorders of lung: Secondary | ICD-10-CM

## 2013-07-12 DIAGNOSIS — J45909 Unspecified asthma, uncomplicated: Secondary | ICD-10-CM

## 2013-07-12 NOTE — Assessment & Plan Note (Signed)
The patient's asthma appears to be stable at this time.  He has excellent air flow and no acute bronchospasm.  I suspect this is not the cause of his current decompensation.

## 2013-07-12 NOTE — Assessment & Plan Note (Signed)
I suspect the patient has decompensated heart failure based on his 11 pound weight gain, and the feeling that he is swollen overall.  I have asked him to increase his Lasix to twice a day for the next few days to see if this helps.

## 2013-07-12 NOTE — Progress Notes (Signed)
  Subjective:    Patient ID: [name], male    DOB: 02-08-1981, 32 y.o.   MRN: [id number]  HPI Patient comes in today for an acute sick visit.  He has had increasing shortness of breath over the last few weeks, and his weight is up 11 pounds from his visit a month ago.  He denies noncompliance with his furosemide, but has been having issues using symbicort because of coordination and his ability to take a deep breath.  He does not feel this is like his usual asthma flare, and he has not had any chest congestion or purulent mucus.  He does feel that his body is "swollen", despite having no lower extremity edema.  He denies any pleuritic chest pain or calf tenderness.   Review of Systems  Constitutional: Negative for fever and unexpected weight change.  HENT: Negative for congestion, dental problem, ear pain, nosebleeds, postnasal drip, rhinorrhea, sinus pressure, sneezing, sore throat and trouble swallowing.   Eyes: Negative for redness and itching.  Respiratory: Positive for shortness of breath. Negative for cough, chest tightness and wheezing.   Cardiovascular: Negative for palpitations and leg swelling.       Body swelling, "bloating" 10lb weight gain  Gastrointestinal: Negative for nausea and vomiting.  Genitourinary: Negative for dysuria.  Musculoskeletal: Negative for joint swelling.  Skin: Negative for rash.  Neurological: Negative for headaches.  Hematological: Does not bruise/bleed easily.  Psychiatric/Behavioral: Negative for dysphoric mood. The patient is not nervous/anxious.        Objective:   Physical Exam Well-developed male in no acute distress Nose without purulent or discharge noted Oropharynx clear without thrush Neck without lymphadenopathy or thyromegaly Chest with adequate air flow, no wheezes or crackles. Cardiac exam with regular rate and rhythm Abdomen with increasing girth, bowel sounds positive Lower extremities without significant edema, no  cyanosis Alert and oriented, moves all 4 extremities.       Assessment & Plan:

## 2013-07-12 NOTE — Patient Instructions (Signed)
Finish up steroid taper. Increase your furosemide to am AND pm until the end of the week.  Weigh yourself daily starting today, and we need to see a reduction over the next few days. Call me Thursday am so we can discuss your fluid pill dose, and check on your progress.  Call sooner if you are not improving in 24-48 hrs.  Stay on symbicort, but use spacer.  Keep mouth rinsed.

## 2013-08-09 ENCOUNTER — Encounter: Payer: Self-pay | Admitting: Adult Health

## 2013-08-09 ENCOUNTER — Ambulatory Visit (INDEPENDENT_AMBULATORY_CARE_PROVIDER_SITE_OTHER): Payer: Commercial Managed Care - PPO | Admitting: Adult Health

## 2013-08-09 VITALS — BP 128/74 | HR 103 | Temp 97.4°F | Ht 76.0 in | Wt 229.4 lb

## 2013-08-09 DIAGNOSIS — J45909 Unspecified asthma, uncomplicated: Secondary | ICD-10-CM

## 2013-08-09 DIAGNOSIS — J329 Chronic sinusitis, unspecified: Secondary | ICD-10-CM

## 2013-08-09 MED ORDER — AMOXICILLIN-POT CLAVULANATE 875-125 MG PO TABS: 1.0000 | ORAL_TABLET | Freq: Two times a day (BID) | ORAL | Status: AC

## 2013-08-09 MED ORDER — PREDNISONE 10 MG PO TABS: ORAL_TABLET | ORAL | Status: DC

## 2013-08-09 MED ORDER — HYDROCODONE-HOMATROPINE 5-1.5 MG/5ML PO SYRP: 5.0000 mL | ORAL_SOLUTION | Freq: Four times a day (QID) | ORAL | Status: DC | PRN

## 2013-08-09 NOTE — Progress Notes (Signed)
  Subjective:    Patient ID: Christen Bame, male    DOB: [birth year], 32 y.o.   MRN: [id number]  HPI  32yo with known hx of severe restrictive disease secondary to pectus deformity, and is on bilevel for hypercarbia.   He also has known asthma.   08/09/2013 Acute OV  Complains of wheezing, some prod cough with yellow mucus, head congestion w/ PND, some increased SOB x1 week.  Worse x3-4days w/ recent travel to [place].  Not taking any otc meds.  Has sinus pain and pressure, some yellow nasal discharge.  Remains on symbicort Twice daily  .  Wearing BIPAP At bedtime . No known issues.  No fever, hemoptysis, chest pain, orthopnea, or increased edema.       Review of Systems  Constitutional:   No  weight loss, night sweats,  Fevers, chills,  +fatigue, or  lassitude.  HEENT:   No headaches,  Difficulty swallowing,  Tooth/dental problems, or  Sore throat,                No sneezing, itching, ear ache,  +nasal congestion, post nasal drip,   CV:  No chest pain,  Orthopnea, PND, swelling in lower extremities, anasarca, dizziness, palpitations, syncope.   GI  No heartburn, indigestion, abdominal pain, nausea, vomiting, diarrhea, change in bowel habits, loss of appetite, bloody stools.   Resp:  No coughing up of blood.   Marland Kitchen  No chest wall deformity  Skin: no rash or lesions.  GU: no dysuria, change in color of urine, no urgency or frequency.  No flank pain, no hematuria   MS:  No joint pain or swelling.  No decreased range of motion.  No back pain.  Psych:  No change in mood or affect. No depression or anxiety.  No memory loss.         Objective:   Physical Exam  GEN: A/Ox3; pleasant , NAD, tall , overweight   HEENT:  [place]/AT,  EACs-clear, TMs-wnl, NOSE-clear, +max sinus tenderness THROAT-clear, no lesions, no postnasal drip or exudate noted.   NECK:  Supple w/ fair ROM; no JVD; normal carotid impulses w/o bruits; no thyromegaly or nodules palpated; no lymphadenopathy.  RESP   Diminshed BS in bases no accessory muscle use, no dullness to percussion  Chest -s/p pectus deformity  repair   CARD:  RRR, no m/r/g  , tr  peripheral edema, pulses intact, no cyanosis or clubbing   GI:   Soft & nt; nml bowel sounds; no organomegaly or masses detected.  Musco: Warm bil, no deformities or joint swelling noted.   Neuro: alert, no focal deficits noted.    Skin: Warm, no lesions or rashes         Assessment & Plan:

## 2013-08-09 NOTE — Patient Instructions (Signed)
Augmentin Twice daily  For 10 days   Use neilmed sinus rinses am and pm As needed   Prednisone taper as directed.  Hydromet 1-2 tsp every 4-6hrs As needed  Cough/congestion -may make you sleepy.  Please contact office for sooner follow up if symptoms do not improve or worsen or seek emergency care  Follow up Dr. Shelle Iron as planned and As needed

## 2013-08-10 ENCOUNTER — Encounter: Payer: Self-pay | Admitting: Adult Health

## 2013-08-10 NOTE — Assessment & Plan Note (Signed)
Mild flare with sinusitis /bronchits   Plan  Augmentin Twice daily  For 10 days   Use neilmed sinus rinses am and pm As needed   Prednisone taper as directed.  Hydromet 1-2 tsp every 4-6hrs As needed  Cough/congestion -may make you sleepy.  Please contact office for sooner follow up if symptoms do not improve or worsen or seek emergency care  Follow up Dr. Shelle Iron as planned and As needed

## 2013-08-10 NOTE — Progress Notes (Signed)
Ov reviewed, and agree with plan as outlined.  Pt needs to see his ENT!!!

## 2013-08-10 NOTE — Assessment & Plan Note (Signed)
Flare with Bronchitis   Plan  Augmentin Twice daily  For 10 days   Use neilmed sinus rinses am and pm As needed   Prednisone taper as directed.  Hydromet 1-2 tsp every 4-6hrs As needed  Cough/congestion -may make you sleepy.  Please contact office for sooner follow up if symptoms do not improve or worsen or seek emergency care  Follow up Dr. Shelle Iron as planned and As needed

## 2013-10-15 ENCOUNTER — Other Ambulatory Visit: Payer: Self-pay | Admitting: Adult Health

## 2013-12-07 ENCOUNTER — Ambulatory Visit: Payer: Commercial Managed Care - PPO | Admitting: Pulmonary Disease

## 2014-04-25 ENCOUNTER — Other Ambulatory Visit: Payer: Self-pay | Admitting: Pulmonary Disease

## 2014-04-29 ENCOUNTER — Telehealth: Payer: Self-pay | Admitting: Pulmonary Disease

## 2014-04-29 MED ORDER — BUDESONIDE-FORMOTEROL FUMARATE 160-4.5 MCG/ACT IN AERO: 2.0000 | INHALATION_SPRAY | Freq: Two times a day (BID) | RESPIRATORY_TRACT | Status: DC

## 2014-04-29 MED ORDER — ALBUTEROL SULFATE HFA 108 (90 BASE) MCG/ACT IN AERS: 2.0000 | INHALATION_SPRAY | Freq: Four times a day (QID) | RESPIRATORY_TRACT | Status: DC | PRN

## 2014-04-29 NOTE — Telephone Encounter (Signed)
Pt aware rx has been sent. Pt will keep pending appt. Nothing further needed

## 2014-04-30 ENCOUNTER — Other Ambulatory Visit: Payer: Self-pay | Admitting: Pulmonary Disease

## 2014-05-02 ENCOUNTER — Encounter: Payer: Self-pay | Admitting: Pulmonary Disease

## 2014-05-02 ENCOUNTER — Ambulatory Visit (INDEPENDENT_AMBULATORY_CARE_PROVIDER_SITE_OTHER): Payer: Commercial Managed Care - PPO | Admitting: Pulmonary Disease

## 2014-05-02 VITALS — BP 128/88 | HR 75 | Temp 98.1°F | Ht 76.0 in | Wt 225.8 lb

## 2014-05-02 DIAGNOSIS — J984 Other disorders of lung: Secondary | ICD-10-CM

## 2014-05-02 DIAGNOSIS — J45909 Unspecified asthma, uncomplicated: Secondary | ICD-10-CM

## 2014-05-02 NOTE — Progress Notes (Signed)
   Subjective:    Patient ID: [name], male    DOB: 08/12/1981, 33 y.o.   MRN: [id number]  HPI The patient comes in today for followup of his multiple pulmonary issues. He has known asthma for which he is doing well on his current inhaler regimen, and also has restrictive lung disease secondary to pectus excavatum which requires bilevel at night for ventilatory support. He is been doing very well with his bilevel, and feels that he sleeps well with the device. He is having no mask fit or pressure issues. He has not had a recent acute exacerbation of his asthma, and rarely uses his rescue inhaler. He has noted some increased shortness of breath over the summer, but believes this is due to to the high heat and humidity as well as his conditioning.   Review of Systems  Constitutional: Negative for fever and unexpected weight change.  HENT: Negative for congestion, dental problem, ear pain, nosebleeds, postnasal drip, rhinorrhea, sinus pressure, sneezing, sore throat and trouble swallowing.   Eyes: Negative for redness and itching.  Respiratory: Positive for shortness of breath. Negative for cough, chest tightness and wheezing.   Cardiovascular: Negative for palpitations and leg swelling.  Gastrointestinal: Negative for nausea and vomiting.  Genitourinary: Negative for dysuria.  Musculoskeletal: Negative for joint swelling.  Skin: Negative for rash.  Neurological: Negative for headaches.  Hematological: Does not bruise/bleed easily.  Psychiatric/Behavioral: Negative for dysphoric mood. The patient is not nervous/anxious.        Objective:   Physical Exam Overweight male in no acute distress Nose without purulence or discharge noted Neck without lymphadenopathy or thyromegaly No skin breakdown or pressure necrosis from the CPAP mask Chest with totally clear breath sounds, no crackles Cardiac exam with regular rate and rhythm Lower extremities without edema, no cyanosis Alert and  oriented, moves all 4 extremities.       Assessment & Plan:

## 2014-05-02 NOTE — Assessment & Plan Note (Signed)
The patient is doing well from an asthma standpoint, and has not had an acute exacerbation on his current regimen.

## 2014-05-02 NOTE — Patient Instructions (Signed)
Continue on your asthma medication Stay on bipap, and keep up with mask changes and supplies Work on weight loss and conditioning. followup with me again in 72mos.

## 2014-05-02 NOTE — Assessment & Plan Note (Signed)
The patient has significant restrictive lung disease on the basis of a pectus deformity, and is wearing bilevel at night to maintain adequate ventilation. He is doing well on BiPAP, with no mask fit or pressure issues. I have asked him to work on weight loss.

## 2014-05-08 ENCOUNTER — Other Ambulatory Visit: Payer: Self-pay | Admitting: Pulmonary Disease

## 2014-07-14 ENCOUNTER — Other Ambulatory Visit: Payer: Self-pay | Admitting: Physician Assistant

## 2014-08-24 ENCOUNTER — Other Ambulatory Visit: Payer: Self-pay | Admitting: Pulmonary Disease

## 2014-09-19 ENCOUNTER — Other Ambulatory Visit: Payer: Self-pay

## 2014-09-19 MED ORDER — POTASSIUM CHLORIDE CRYS ER 20 MEQ PO TBCR: 20.0000 meq | EXTENDED_RELEASE_TABLET | Freq: Every day | ORAL | Status: DC

## 2014-09-19 MED ORDER — BISOPROLOL FUMARATE 5 MG PO TABS: 5.0000 mg | ORAL_TABLET | Freq: Every day | ORAL | Status: DC

## 2014-09-19 MED ORDER — ISOSORBIDE MONONITRATE ER 30 MG PO TB24: 30.0000 mg | ORAL_TABLET | Freq: Every day | ORAL | Status: DC

## 2014-09-19 MED ORDER — FUROSEMIDE 40 MG PO TABS: 40.0000 mg | ORAL_TABLET | Freq: Every day | ORAL | Status: DC

## 2014-10-10 ENCOUNTER — Ambulatory Visit: Payer: Commercial Managed Care - PPO | Admitting: Physician Assistant

## 2014-10-14 ENCOUNTER — Ambulatory Visit: Payer: Commercial Managed Care - PPO | Admitting: Physician Assistant

## 2014-10-27 ENCOUNTER — Ambulatory Visit (INDEPENDENT_AMBULATORY_CARE_PROVIDER_SITE_OTHER): Payer: Commercial Managed Care - PPO | Admitting: Physician Assistant

## 2014-10-27 ENCOUNTER — Encounter: Payer: Self-pay | Admitting: Physician Assistant

## 2014-10-27 ENCOUNTER — Telehealth: Payer: Self-pay

## 2014-10-27 ENCOUNTER — Other Ambulatory Visit: Payer: Self-pay

## 2014-10-27 VITALS — BP 110/57 | HR 77 | Ht 76.0 in | Wt 222.0 lb

## 2014-10-27 DIAGNOSIS — I509 Heart failure, unspecified: Secondary | ICD-10-CM

## 2014-10-27 DIAGNOSIS — Q676 Pectus excavatum: Secondary | ICD-10-CM

## 2014-10-27 DIAGNOSIS — I272 Pulmonary hypertension, unspecified: Secondary | ICD-10-CM

## 2014-10-27 DIAGNOSIS — I270 Primary pulmonary hypertension: Secondary | ICD-10-CM

## 2014-10-27 DIAGNOSIS — J984 Other disorders of lung: Secondary | ICD-10-CM

## 2014-10-27 LAB — BASIC METABOLIC PANEL
BUN: 12 mg/dL (ref 6–23)
CALCIUM: 9.3 mg/dL (ref 8.4–10.5)
CO2: 39 meq/L — AB (ref 19–32)
Chloride: 96 mEq/L (ref 96–112)
Creatinine, Ser: 0.81 mg/dL (ref 0.40–1.50)
GFR: 115.46 mL/min (ref >60.00)
GLUCOSE: 103 mg/dL — AB (ref 70–99)
Potassium: 3.4 mEq/L — ABNORMAL LOW (ref 3.5–5.1)
SODIUM: 139 meq/L (ref 135–145)

## 2014-10-27 MED ORDER — POTASSIUM CHLORIDE CRYS ER 20 MEQ PO TBCR: 20.0000 meq | EXTENDED_RELEASE_TABLET | Freq: Every day | ORAL | Status: DC

## 2014-10-27 MED ORDER — FUROSEMIDE 40 MG PO TABS: 40.0000 mg | ORAL_TABLET | Freq: Every day | ORAL | Status: DC

## 2014-10-27 MED ORDER — POTASSIUM CHLORIDE CRYS ER 20 MEQ PO TBCR: 40.0000 meq | EXTENDED_RELEASE_TABLET | Freq: Every day | ORAL | Status: DC

## 2014-10-27 MED ORDER — BISOPROLOL FUMARATE 5 MG PO TABS: 5.0000 mg | ORAL_TABLET | Freq: Every day | ORAL | Status: DC

## 2014-10-27 MED ORDER — ISOSORBIDE MONONITRATE ER 30 MG PO TB24: 30.0000 mg | ORAL_TABLET | Freq: Every day | ORAL | Status: DC

## 2014-10-27 NOTE — Telephone Encounter (Signed)
error 

## 2014-10-27 NOTE — Patient Instructions (Addendum)
Your physician has requested that you have an echocardiogram WITH CONTRAST. Echocardiography is a painless test that uses sound waves to create images of your heart. It provides your doctor with information about the size and shape of your heart and how well your heart's chambers and valves are working. This procedure takes approximately one hour. There are no restrictions for this procedure.  OK TO TAKE EXTRA LASIX AND POTASSIUM IF INCREASED SHORTNESS OF BREATH OR IF INCREASE IN ABDOMINAL GIRTH  LAB WORK TODAY BMET  REFILLS SENT IN FOR IMDUR, LASIX AND POTASSIUM   Your physician wants you to follow-up in: [place]. Aundra Dubin. You will receive a reminder letter in the mail two months in advance. If you don't receive a letter, please call our office to schedule the follow-up appointment.

## 2014-10-27 NOTE — Progress Notes (Signed)
Cardiology Office Note   Date:  10/27/2014   ID:  [name], DOB Nov 23, 1980, MRN [id number]  Patient Care Team: Stephens Shire, MD as PCP - General (Family Medicine) Larey Dresser, MD as Consulting Physician (Cardiology) Kathee Delton, MD as Consulting Physician (Pulmonary Disease)    Chief Complaint  Patient presents with  . Chest Pain     History of Present Illness: [name] is a 34 y.o. male with a hx of pectus excavatum s/p repair in childhood and redo pectus repair in 6/11, asthma, restrictive lung disease 2/2 pectus excavatum on BiPap (followed by Dr. Gwenette Greet).  He has been wearing Bipap at night. He has done maintenance pulmonary rehab at the hospital in [place]. Patient presented in 2010 with primarily right-sided heart failure likely due to pulmonary hypertension from a combination of left atrial compression and nocturnal hypoxemia. He had improved symptoms after redo pectus surgery.  Follow-up echocardiography didn't demonstrate continued compression of the left atrium, likely between the ascending and descending aorta. The RV was difficult to visualize but it was suspected there was moderate dilatation and mild systolic dysfunction. Last seen by Dr. Loralie Champagne in 01/2013.  Follow-up echocardiogram in 6/14 demonstrated normal LV function. The left atrium continued to appear to be compressed and appeared similar to prior studies . The right ventricle appeared normal in size and function.  He has chronic DOE without significant change.  He is NYHA 2b.  He denies orthopnea, PND.  He sleeps with BiPap.  Denies edema.  He has chronic chest pain from his surgery without change.  He denies syncope.  He does have increased abdominal girth.  It is not clear if this is worse.  His weights are stable.     Studies/Reports Reviewed Today:  Echocardiogram 02/2013 - EF 55%. Wall motion was normal.  Diastolic function parameters were normal. - Ventricular septum: There  is a mild D shape to the interventricular septum. - Left atrium: The left atrium appears compressed, possibly by the aorta. - Right ventricle: The cavity size was normal. Systolic function was normal. - Right atrium: The atrium was mildly dilated. Impressions:  Normal LV size with EF 55%. Mild D-shape to the interventricular septum. The RV actually appears normal in size with normal systolic function. Unable to estimate PA systolic pressure given lack of TR jet. The LA appears compressed by the aorta, similar to past studies.  Right Heart Cath 02/2009 Right atrium mean 13 mmHg.  RV 38/12, PA 42/23 with mean PA pressure 34 mmHg.  Mean pulmonary capillary wedge pressure 15 mmHg. Oxygen saturations. Mid right atrium 63%, high right atrium 59%, right ventricle 63%, pulmonary artery 64%, aorta 86%.   Past Medical History:  1. Pectus excavatum status post repair in [place] in childhood and repeat repair at [facility] in 6/11. Borderline Marfanoid habitus.  2. Asthma.  3. CHF: Patient was admitted in 6/10 with CHF and volume overload. He was diuresed with IV lasix and felt better. Echo suggested that the RV was enlarged. RHC was done (after several doses of IV lasix) showing mean RA 13, RV 38/12, PA 42/23 mean 34, mean PCWP 15. PVR < 2 WU. Shunt run showed no O2 step up. Patient was hypoxic and was started O2. Cardiac MRI (6/10) showed normal LV size and systolic function EF 78%, D-shaped IV septum, moderate to severe RV dilation with RV EF 37%. The pulmonic valve opened normally. The heart was shifted leftward (pectus) with compression of the  LA between the ascending and descending aorta. MRA of the chest showed normal pulmonary veins. CHF was primarily right-sided with mild pulmonary HTN noted. He is hypoxic, especially at night, so hypoxic pulmonary vasoconstriction could be playing a role. PCWP was only mildly elevated but I wonder if LA compression could be playing a role. Repeat echo after pectus  surgery in 11/11 showed normal LV systolic function and size with a D-shaped interventricular septum. The LA was still compressed between ascending and descending aorta. The RV was moderately dilated with mild systolic dysfunction.  4. 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  5. 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 Patient is on Bipap.  Past Medical History  Diagnosis Date  . Pectus excavatum   . Asthma   . CHF (congestive heart failure)   . Restrictive lung disease   . Chronic hypercapnic respiratory failure     nocturnal  hypoxemic resp failure, on bipap    Past Surgical History  Procedure Laterality Date  . Pectus excavatum repair    . Knee surgery      right     Current Outpatient Prescriptions  Medication Sig Dispense Refill  . albuterol (PROAIR HFA) 108 (90 BASE) MCG/ACT inhaler Inhale 2 puffs into the lungs every 6 (six) hours as needed for wheezing or shortness of breath. 8.5 each 0  . bisoprolol (ZEBETA) 5 MG tablet Take 1 tablet (5 mg total) by mouth daily. 30 tablet 1  . budesonide-formoterol (SYMBICORT) 160-4.5 MCG/ACT inhaler Inhale 2 puffs into the lungs 2 (two) times daily. 1 Inhaler 0  . cetirizine (ZYRTEC) 10 MG tablet Take 1 tablet (10 mg total) by mouth daily. 30 tablet 2  . furosemide (LASIX) 40 MG tablet Take 1 tablet (40 mg total) by mouth daily. 30 tablet 1  . isosorbide mononitrate (IMDUR) 30 MG 24 hr tablet Take 1 tablet (30 mg total) by mouth daily. 30 tablet 1  . Oxymetazoline HCl (SUDAFED OM SINUS COLD NA) Take 1 tablet by mouth as needed.    . potassium chloride SA (K-DUR,KLOR-CON) 20 MEQ tablet Take 1 tablet (20 mEq total) by mouth daily. 30 tablet 1   No current facility-administered medications for this visit.    Allergies:   Review of patient's allergies indicates no known allergies.    Social History:  The patient  reports that he quit smoking about 8 years ago. His  smoking use included Cigarettes. He has a .1 pack-year smoking history. He has never used smokeless tobacco.   Family History:  The patient's family history includes Hypertension in his mother. There is no history of Heart attack or Stroke.    ROS:   Please see the history of present illness.   Review of Systems  Constitution: Positive for fever. Negative for weight gain.  HENT: Positive for headaches.        + Facial swelling  Respiratory: Negative for cough and wheezing.   Gastrointestinal: Negative for diarrhea and vomiting.  All other systems reviewed and are negative.     PHYSICAL EXAM: VS:  BP 110/57 mmHg  Pulse 77  Ht 6\' 4"  (1.93 m)  Wt 222 lb (100.699 kg)  BMI 27.03 kg/m2  SpO2 92%    Wt Readings from Last 3 Encounters:  10/27/14 222 lb (100.699 kg)  05/02/14 225 lb 12.8 oz (102.422 kg)  08/09/13 229 lb 6.4 oz (104.055 kg)     GEN: Well nourished, well developed, in no acute  distress HEENT: normal Neck: no JVD, no masses Cardiac:  Normal S1/S2, RRR; no murmur, no rubs or gallops, no edema  Respiratory:  clear to auscultation bilaterally, no wheezing, rhonchi or rales. GI: soft, nontender, nondistended, + BS MS: no deformity or atrophy Skin: warm and dry  Neuro:  CNs II-XII intact, Strength and sensation are intact Psych: Normal affect   EKG:  EKG is ordered today.  It demonstrates:   NSR, HR 77, normal axis, BAE, IVCD, no change from prior tracing   Recent Labs: No results found for requested labs within last 365 days.    Lipid Panel No results found for: CHOL, TRIG, HDL, CHOLHDL, VLDL, LDLCALC, LDLDIRECT    ASSESSMENT AND PLAN:  1.  CONGESTIVE HEART FAILURE: Patient presented with primarily right-sided CHF in 2010, likely due to pulmonary hypertension from a combination of left atrial compression and nocturnal hypoxemia. He is now using Bipap every night. He had a redo pectus surgery and his symptoms improved. It has been suspected that the surgery  relieved some of the compression on his lungs but followup echo did show continued compression of the left atrium, likely between the ascending and descending aorta. The RV on the most recent echo demonstrated normal RV size and function.     -  Continue Bipap     -  Continue current Lasix.  He may take extra for increased dyspnea or weight.    -  Arrange FU echo.     -  BMET today. 2. PULMONARY HYPERTENSION: Patient likely had a combination of secondary pulmonary hypertension from LA compression as well as hypoxemic pulmonary vasoconstriction from nocturnal hypoxemia.     -  Arrange FU echo as noted above.  3. RESTRICTIVE LUNG DISEASE: Lung disease from restriction due to pectus. He has had chronic hypercarbic respiratory failure. This may be the major player in his dyspnea. FU with pulmonary as planned.    Current medicines are reviewed at length with the patient today.  The patient does not have concerns regarding medicines.  The following changes have been made:  As above.   Labs/ tests ordered today include:   Orders Placed This Encounter  Procedures  . Basic Metabolic Panel (BMET)  . EKG 12-Lead  . 2D Echocardiogram with contrast    Disposition:   FU with Dr. Loralie Champagne  in 6 months   Signed, Versie Starks, MHS 10/27/2014 11:28 AM    [address], [place], [place]  [address] Phone: [phone]; Fax: [phone]

## 2014-10-28 ENCOUNTER — Other Ambulatory Visit: Payer: Self-pay | Admitting: Physician Assistant

## 2014-10-28 MED ORDER — POTASSIUM CHLORIDE CRYS ER 20 MEQ PO TBCR: 40.0000 meq | EXTENDED_RELEASE_TABLET | Freq: Every day | ORAL | Status: DC

## 2014-10-31 ENCOUNTER — Other Ambulatory Visit (HOSPITAL_COMMUNITY): Payer: Commercial Managed Care - PPO

## 2014-11-01 ENCOUNTER — Encounter (HOSPITAL_COMMUNITY): Payer: Self-pay | Admitting: Cardiology

## 2014-11-02 ENCOUNTER — Ambulatory Visit: Payer: Commercial Managed Care - PPO | Admitting: Pulmonary Disease

## 2014-11-02 ENCOUNTER — Ambulatory Visit (HOSPITAL_COMMUNITY): Payer: Commercial Managed Care - PPO

## 2014-11-04 ENCOUNTER — Other Ambulatory Visit (HOSPITAL_COMMUNITY): Payer: Commercial Managed Care - PPO

## 2014-11-07 ENCOUNTER — Encounter (HOSPITAL_COMMUNITY): Payer: Self-pay | Admitting: Cardiology

## 2014-11-15 ENCOUNTER — Telehealth: Payer: Self-pay | Admitting: Cardiology

## 2014-11-15 NOTE — Telephone Encounter (Signed)
Called patient X to schedule Echocardiogram, finally spoke with him. He informed me that he was getting new insurance and didn't want to schedule @ this time. He said he would call back to get the appointment scheduled, I removed order out of work que.

## 2014-11-16 NOTE — Telephone Encounter (Signed)
Looks like Event organiser ordered this.

## 2014-11-22 ENCOUNTER — Telehealth: Payer: Self-pay | Admitting: Pulmonary Disease

## 2014-11-22 DIAGNOSIS — J984 Other disorders of lung: Secondary | ICD-10-CM

## 2014-11-22 NOTE — Telephone Encounter (Signed)
Called and spoke with pt and he stated that he is needing an rx for the bipap mask.  He is requesting that this be faxed to him at [phone].  This has been done and nothing further is needed.

## 2014-11-22 NOTE — Telephone Encounter (Signed)
Pt is still sitting at [facility] and needs this done now.

## 2015-01-06 ENCOUNTER — Ambulatory Visit (INDEPENDENT_AMBULATORY_CARE_PROVIDER_SITE_OTHER)
Admission: RE | Admit: 2015-01-06 | Discharge: 2015-01-06 | Disposition: A | Discharge status: Home / Self Care | Payer: Managed Care, Other (non HMO) | Source: Ambulatory Visit | Attending: Pulmonary Disease | Admitting: Pulmonary Disease

## 2015-01-06 ENCOUNTER — Ambulatory Visit (INDEPENDENT_AMBULATORY_CARE_PROVIDER_SITE_OTHER): Payer: Managed Care, Other (non HMO) | Admitting: Pulmonary Disease

## 2015-01-06 ENCOUNTER — Encounter: Payer: Self-pay | Admitting: Pulmonary Disease

## 2015-01-06 ENCOUNTER — Telehealth: Payer: Self-pay | Admitting: Pulmonary Disease

## 2015-01-06 VITALS — BP 110/60 | HR 74 | Temp 97.6°F | Ht 76.0 in | Wt 224.6 lb

## 2015-01-06 DIAGNOSIS — I509 Heart failure, unspecified: Secondary | ICD-10-CM | POA: Diagnosis not present

## 2015-01-06 DIAGNOSIS — I272 Pulmonary hypertension, unspecified: Secondary | ICD-10-CM

## 2015-01-06 DIAGNOSIS — J984 Other disorders of lung: Secondary | ICD-10-CM

## 2015-01-06 DIAGNOSIS — J4540 Moderate persistent asthma, uncomplicated: Secondary | ICD-10-CM

## 2015-01-06 DIAGNOSIS — I270 Primary pulmonary hypertension: Secondary | ICD-10-CM

## 2015-01-06 DIAGNOSIS — J9612 Chronic respiratory failure with hypercapnia: Secondary | ICD-10-CM

## 2015-01-06 DIAGNOSIS — J9621 Acute and chronic respiratory failure with hypoxia: Secondary | ICD-10-CM

## 2015-01-06 DIAGNOSIS — J9611 Chronic respiratory failure with hypoxia: Secondary | ICD-10-CM | POA: Insufficient documentation

## 2015-01-06 NOTE — Telephone Encounter (Signed)
ONO on BIPAP w/ 2 liters has been ordered.  Called pt and is aware of [place] response below. He is scheduled to see TP Thursday 01/12/15 at 2:45 (okay per JJ). Nothing further needed

## 2015-01-06 NOTE — Assessment & Plan Note (Signed)
The patient has a history of chronic congestive heart failure, but feels that his weights have been within his usual range, and he has no edema on exam today. There are no crackles on exam to suggest acute pulmonary edema.

## 2015-01-06 NOTE — Assessment & Plan Note (Signed)
The patient feels that his asthma is adequately controlled at this time, and denies any chest tightness or wheezing. His lung fields were also totally clear on exam today.

## 2015-01-06 NOTE — Patient Instructions (Signed)
You need to wear your oxygen 24hrs/day until we figure out why you are worse Will schedule for echo ASAP Check chest xray today, and will call you with results.

## 2015-01-06 NOTE — Telephone Encounter (Signed)
Mindy, please schedule this pt a followup visit with TP end of next week to make sure he is doing better.  Also need to f/u echo that has been ordered .  Also, please order an ONO on bipap and current oxygen.  TP can review this as well.  Thanks.

## 2015-01-06 NOTE — Progress Notes (Signed)
   Subjective:    Patient ID: [name], male    DOB: Jun 11, 1981, 34 y.o.   MRN: [id number]  HPI The patient comes in today for an acute sick visit. He has known asthma, restrictive lung disease secondary to pectus deformity which requires bilevel and oxygen at night, as well as significant pulmonary hypertension related to left atrial compression because of his pectus deformity. He comes in today with worsening hypoxemia and dyspnea, although he does not feel that his breathing is that bad. He denies any cough or congestion, and denies any issues with his asthma. He has not had any chest pain or pleuritic chest discomfort, and has had no lower extremity edema. He tells me that he has been weighing compliantly, and his weights are stable. He also tells me that he has been compliant with his bilevel device and oxygen at night.   Review of Systems  Constitutional: Negative for fever and unexpected weight change.  HENT: Positive for congestion, postnasal drip and sinus pressure. Negative for dental problem, ear pain, nosebleeds, rhinorrhea, sneezing, sore throat and trouble swallowing.   Eyes: Negative for redness and itching.  Respiratory: Positive for shortness of breath. Negative for cough, chest tightness and wheezing.   Cardiovascular: Negative for palpitations and leg swelling.  Gastrointestinal: Negative for nausea and vomiting.  Genitourinary: Negative for dysuria.  Musculoskeletal: Negative for joint swelling.  Skin: Negative for rash.  Neurological: Positive for headaches.  Hematological: Does not bruise/bleed easily.  Psychiatric/Behavioral: Negative for dysphoric mood. The patient is not nervous/anxious.        Objective:   Physical Exam Overweight male in no acute distress Nose without purulence or discharge noted Neck without lymphadenopathy or thyromegaly Chest totally clear to auscultation, no wheezing Cardiac exam with regular rate and rhythm, 1/6 systolic  murmur Lower extremities without edema, no cyanosis Alert and oriented, moves all 4 extremities.       Assessment & Plan:

## 2015-01-06 NOTE — Assessment & Plan Note (Signed)
The patient has acute on chronic hypoxemic respiratory failure today, and it is unclear what is causing this. His asthma is totally under control with clear lungs and excellent air flow. He has no lower extremity edema, and the patient tells me that his weights have been stable. There is nothing to suggest thromboembolic disease by history or exam, but this will need to be kept in mind going forward. I am concerned that he may have had an escalation of his pulmonary pressures, but he tells me that he has been wearing his bilevel device with nocturnal oxygen. However, he has a long history of noncompliance with this. At this point, he is obviously going to need oxygen 24 hours a day until we can sort this out. He has a portable tank at home that he never uses, and I have asked him to get on this. I will check a chest x-ray today, and also schedule him for an urgent echocardiogram. He was due for a follow-up, however put off the scheduling. He understands that if he gets worse in the interim, he is to go to the emergency room.

## 2015-01-10 ENCOUNTER — Other Ambulatory Visit: Payer: Self-pay | Admitting: Pulmonary Disease

## 2015-01-10 ENCOUNTER — Ambulatory Visit (HOSPITAL_COMMUNITY)
Admission: RE | Admit: 2015-01-10 | Discharge: 2015-01-10 | Disposition: A | Discharge status: Home / Self Care | Payer: Managed Care, Other (non HMO) | Source: Ambulatory Visit | Attending: Family Medicine | Admitting: Family Medicine

## 2015-01-10 DIAGNOSIS — I270 Primary pulmonary hypertension: Secondary | ICD-10-CM | POA: Diagnosis present

## 2015-01-10 DIAGNOSIS — J984 Other disorders of lung: Secondary | ICD-10-CM

## 2015-01-10 DIAGNOSIS — I272 Pulmonary hypertension, unspecified: Secondary | ICD-10-CM

## 2015-01-10 DIAGNOSIS — R0600 Dyspnea, unspecified: Secondary | ICD-10-CM | POA: Diagnosis not present

## 2015-01-10 NOTE — Progress Notes (Signed)
  Echocardiogram 2D Echocardiogram has been performed.  [name] 01/10/2015, 4:37 PM

## 2015-01-12 ENCOUNTER — Ambulatory Visit (INDEPENDENT_AMBULATORY_CARE_PROVIDER_SITE_OTHER): Payer: Managed Care, Other (non HMO) | Admitting: Adult Health

## 2015-01-12 ENCOUNTER — Encounter: Payer: Self-pay | Admitting: Adult Health

## 2015-01-12 VITALS — BP 126/80 | HR 66 | Temp 98.1°F | Ht 76.0 in | Wt 222.0 lb

## 2015-01-12 DIAGNOSIS — I5022 Chronic systolic (congestive) heart failure: Secondary | ICD-10-CM | POA: Diagnosis not present

## 2015-01-12 DIAGNOSIS — J9621 Acute and chronic respiratory failure with hypoxia: Secondary | ICD-10-CM | POA: Diagnosis not present

## 2015-01-12 DIAGNOSIS — J4540 Moderate persistent asthma, uncomplicated: Secondary | ICD-10-CM

## 2015-01-12 DIAGNOSIS — J329 Chronic sinusitis, unspecified: Secondary | ICD-10-CM | POA: Diagnosis not present

## 2015-01-12 NOTE — Assessment & Plan Note (Signed)
Recent flare, now resolved Patient is to use oxygen with activity to keep saturations above 88-90% Wear oxygen at bedtime with his BiPAP

## 2015-01-12 NOTE — Assessment & Plan Note (Signed)
Discontinue Sudafed  use saline nasal rinses as needed  Mucinex as needed  follow with ENT as planned

## 2015-01-12 NOTE — Progress Notes (Signed)
  Subjective:    Patient ID: [name], male    DOB: 03-06-1981, 34 y.o.   MRN: [id number]  HPI 34 yo with known hx of severe restrictive disease secondary to pectus deformity, and is on bilevel for hypercarbia.   He also has known asthma.   01/12/2015 follow-up Patient returns for a one-week follow-up. Patient was seen last week for acute visit for worsening shortness of breath and hypoxemia. Chest x-ray showed chronic changes without any acute process. 2-D echo showed a normal ejection fraction 55-60%,  -no significant changes since 2014. Patient was recommended to wear his oxygen 24/7 Patient says that he is feeling improved He did wear his oxygen for a couple days , but only uses it as needed nail and at bedtime. Says his O2 saturations have been above 90%. Reports breathing is near baseline.     Remains on symbicort Twice daily  .  Wearing BIPAP At bedtime with oxygen. ONO on BIPAP w/ O2 pending  No fever, hemoptysis, chest pain, orthopnea, or increased edema.  Does have nasal congestion and says he uses Sudafed on occasion.      Review of Systems Constitutional:   No  weight loss, night sweats,  Fevers, chills,  +fatigue, or  lassitude.  HEENT:   No headaches,  Difficulty swallowing,  Tooth/dental problems, or  Sore throat,                No sneezing, itching, ear ache,  +nasal congestion, post nasal drip,   CV:  No chest pain,  Orthopnea, PND, swelling in lower extremities, anasarca, dizziness, palpitations, syncope.   GI  No heartburn, indigestion, abdominal pain, nausea, vomiting, diarrhea, change in bowel habits, loss of appetite, bloody stools.   Resp:  No coughing up of blood.   Marland Kitchen  No chest wall deformity  Skin: no rash or lesions.  GU: no dysuria, change in color of urine, no urgency or frequency.  No flank pain, no hematuria   MS:  No joint pain or swelling.  No decreased range of motion.  No back pain.  Psych:  No change in mood or affect. No  depression or anxiety.  No memory loss.         Objective:   Physical Exam GEN: A/Ox3; pleasant , NAD, tall , overweight   HEENT:  [place]/AT,  EACs-clear, TMs-wnl, NOSE-clear, +max sinus tenderness THROAT-clear, no lesions, no postnasal drip or exudate noted.   NECK:  Supple w/ fair ROM; no JVD; normal carotid impulses w/o bruits; no thyromegaly or nodules palpated; no lymphadenopathy.  RESP  Diminshed BS in bases no accessory muscle use, no dullness to percussion  Chest -s/p pectus deformity  repair   CARD:  RRR, no m/r/g  , tr  peripheral edema, pulses intact, no cyanosis or clubbing   GI:   Soft & nt; nml bowel sounds; no organomegaly or masses detected.  Musco: Warm bil, no deformities or joint swelling noted.   Neuro: alert, no focal deficits noted.    Skin: Warm, no lesions or rashes         Assessment & Plan:

## 2015-01-12 NOTE — Assessment & Plan Note (Signed)
Asthma is controlled on Symbicort He does have chronic sinus disease that may contribute to frequent flares Advised to use Mucinex and saline for his nasal symptoms Avoid Sudafed if possible  Plan  Stop Sudafed.  Use mucinex As needed  Congestion .  Saline nasal rinses .As needed    Follow up Dr. Gwenette Greet in 1 month and . As needed   Please contact office for sooner follow up if symptoms do not improve or worsen or seek emergency care

## 2015-01-12 NOTE — Patient Instructions (Signed)
Stop Sudafed.  Use mucinex As needed  Congestion .  Saline nasal rinses .As needed   Continue on BIPAP At bedtime   Follow up Dr. Gwenette Greet in 1 month and . As needed   Please contact office for sooner follow up if symptoms do not improve or worsen or seek emergency care

## 2015-01-12 NOTE — Assessment & Plan Note (Signed)
No sign of decompensated CHF at this time Recent BNP a couple months ago was normal 2-D echo shows no significant change with preserved EF  Plan Patient's continue on his current regimen Discontinue Sudafed Wear oxygen to keep saturations above 88-90% Low salt diet

## 2015-01-16 NOTE — Progress Notes (Signed)
Ov reviewed and agree with plan as outlined.  

## 2015-01-23 ENCOUNTER — Ambulatory Visit (HOSPITAL_COMMUNITY)
Admission: RE | Admit: 2015-01-23 | Discharge: 2015-01-23 | Disposition: A | Discharge status: Home / Self Care | Payer: Managed Care, Other (non HMO) | Source: Ambulatory Visit | Attending: Internal Medicine | Admitting: Internal Medicine

## 2015-01-23 ENCOUNTER — Encounter (HOSPITAL_COMMUNITY): Payer: Self-pay

## 2015-01-23 VITALS — BP 98/64 | HR 71 | Ht 76.0 in | Wt 221.4 lb

## 2015-01-23 DIAGNOSIS — I270 Primary pulmonary hypertension: Secondary | ICD-10-CM

## 2015-01-23 DIAGNOSIS — Q676 Pectus excavatum: Secondary | ICD-10-CM | POA: Insufficient documentation

## 2015-01-23 DIAGNOSIS — Z79899 Other long term (current) drug therapy: Secondary | ICD-10-CM | POA: Insufficient documentation

## 2015-01-23 DIAGNOSIS — Z87891 Personal history of nicotine dependence: Secondary | ICD-10-CM | POA: Insufficient documentation

## 2015-01-23 DIAGNOSIS — J984 Other disorders of lung: Secondary | ICD-10-CM | POA: Diagnosis not present

## 2015-01-23 DIAGNOSIS — I272 Other secondary pulmonary hypertension: Secondary | ICD-10-CM | POA: Diagnosis not present

## 2015-01-23 DIAGNOSIS — I509 Heart failure, unspecified: Secondary | ICD-10-CM | POA: Insufficient documentation

## 2015-01-23 DIAGNOSIS — I5032 Chronic diastolic (congestive) heart failure: Secondary | ICD-10-CM | POA: Insufficient documentation

## 2015-01-23 DIAGNOSIS — Z8249 Family history of ischemic heart disease and other diseases of the circulatory system: Secondary | ICD-10-CM | POA: Diagnosis not present

## 2015-01-23 DIAGNOSIS — J45909 Unspecified asthma, uncomplicated: Secondary | ICD-10-CM | POA: Insufficient documentation

## 2015-01-23 NOTE — Progress Notes (Signed)
Patient ID: [name], male   DOB: [birth year], 34 y.o.   MRN: [id number]  PCP: Dr. Tollie Pizza  34 yo with history of pectus excavatum s/p repair in childhood and redo pectus repair in 6/11 returns to cardiology clinic for followup. I have not seen him in a couple of years. He has chronic hypercarbic and and nocturnal hypoxemic respiratory failure.  Dr. Gwenette Greet treats his respiratory problems.  He has been wearing Bipap at night.  He continues with maintenance pulmonary rehab at the hospital in [place].  Oxygen saturation drops with heavy exercise, so he has been using oxygen with heavy exercise at pulmonary rehab.  Weight is stable since last appointment.  Last echo was in 5/16 and showed normal LV EF but did not show the RV well.    Patient has had some increase in exertional dyspnea over the last year, but nothing marked.  He is short of breath walking up a flight of steps or walking up hills.  No dyspnea walking on flat ground.  No orthopnea/PND.  No lightheadedness or syncope. No palpitations.   ECG: NSR, biatrial enlargement, poor anterior R wave progression.   Labs (8/10): BNP 9, K 3.6, creatinine 0.7  Labs (2/14): K 3.8, creatinine 0.8, BNP not elevated Labs (2/16): K 3.4, creatinine 0.81   Allergies (verified):  No Known Drug Allergies   Past Medical History:  1. Pectus excavatum status post repair in [place] in childhood and repeat repair at [facility] in 6/11. Borderline Marfanoid habitus.  2. Asthma.  3. CHF: Patient was admitted in 6/10 with CHF and volume overload. He was diuresed with IV lasix and felt better. Echo suggested that the RV was enlarged. RHC was done (after several doses of IV lasix) showing mean RA 13, RV 38/12, PA 42/23 mean 34, mean PCWP 15. PVR < 2 WU. Shunt run showed no O2 step up. Patient was hypoxic and was started O2. Cardiac MRI (6/10) showed normal LV size and systolic function EF 27%, D-shaped IV septum, moderate to severe RV dilation with RV EF 37%. The  pulmonic valve opened normally. The heart was shifted leftward (pectus) with compression of the LA between the ascending and descending aorta. MRA of the chest showed normal pulmonary veins. CHF was primarily right-sided with mild pulmonary HTN noted. He is hypoxic, especially at night, so hypoxic pulmonary vasoconstriction could be playing a role. PCWP was only mildly elevated but I wonder if LA compression could be playing a role. Repeat echo after pectus surgery in 11/11 showed normal LV systolic function and size with a D-shaped interventricular septum. The LA was still compressed between ascending and descending aorta. The RV was moderately dilated with mild systolic dysfunction.  Echo (5/16) with EF 5-60%, aortic root 4.0 cm, RV poorly vsiualized.  4. 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  5. 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 Patient is on Bipap.   Family History:  POS for HTN-Mother   Social History:  The patient lives in [place] with his wife and son.  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  He drinks 1-6 drinks per week, never having more than 3 drinks in 1 evening. No illicit drug use.  Patient states former smoker. started at age 19. only smoked socially. quit smoking at age 72.   Review of Systems  All systems reviewed and negative except as per HPI.  Current Outpatient Prescriptions  Medication Sig Dispense Refill  . albuterol (  PROAIR HFA) 108 (90 BASE) MCG/ACT inhaler Inhale 2 puffs into the lungs every 6 (six) hours as needed for wheezing or shortness of breath. 8.5 each 0  . bisoprolol (ZEBETA) 5 MG tablet Take 1 tablet (5 mg total) by mouth daily. 30 tablet 11  . cetirizine (ZYRTEC) 10 MG tablet Take 1 tablet (10 mg total) by mouth daily. 30 tablet 2  . furosemide (LASIX) 40 MG tablet Take 1 tablet (40 mg total) by mouth daily. 30 tablet 11  . isosorbide mononitrate (IMDUR) 30 MG 24 hr  tablet Take 1 tablet (30 mg total) by mouth daily. 30 tablet 11  . potassium chloride SA (K-DUR,KLOR-CON) 20 MEQ tablet Take 2 tablets (40 mEq total) by mouth daily. 60 tablet 11  . budesonide-formoterol (SYMBICORT) 160-4.5 MCG/ACT inhaler Inhale 2 puffs into the lungs 2 (two) times daily. (Patient not taking: Reported on 01/23/2015) 1 Inhaler 0  . Oxymetazoline HCl (SUDAFED OM SINUS COLD NA) Take 1 tablet by mouth as needed.     No current facility-administered medications for this encounter.    BP 98/64 mmHg  Pulse 71  Ht 6\' 4"  (1.93 m)  Wt 221 lb 6.4 oz (100.426 kg)  BMI 26.96 kg/m2  SpO2 91% General: NAD Neck: No JVD, no thyromegaly or thyroid nodule.  Lungs: Clear to auscultation bilaterally with normal respiratory effort. CV: Nondisplaced PMI.  Heart regular S1/S2, no S3/S4, no murmur.  No peripheral edema.  No carotid bruit.  Normal pedal pulses.  Abdomen: Soft, nontender, no hepatosplenomegaly, no distention.  Neurologic: Alert and oriented x 3.  Psych: Normal affect. Extremities: No clubbing or cyanosis.  MSK: Chest wall with residual pectus deformity  Assessment/Plan: 1. CONGESTIVE HEART FAILURE:  Patient presented with primarily right-sided CHF in 2010, likely due to pulmonary hypertension from a combination of left atrial compression and nocturnal hypoxemia. He is now using Bipap every night. He had a redo pectus surgery in 2011. I suspect the surgery relieved some of the compression on his lungs but followup echo did show continued compression of the left atrium, likely between the ascending and descending aorta. Most recent echo did not visualize the RV.  He has somewhat progressive NYHA class II symptoms.  He is not volume overloaded on exam. - Continue Bipap  - Continue current Lasix  - I will get a cardiac MRI to reassess the RV.  If the RV significantly dilated/dysfunction, I think that he will need a repeat RHC.   2. 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  As above, will reassess with RV with cardiac MRI, may need repeat RHC if RV looks significantly abnormal. .  3. RESTRICTIVE LUNG DISEASE:  Lung disease from restriction due to pectus. He has had chronic hypercarbic respiratory failure.  This may be the major player in his dyspnea. He has had 2 operations already for his pectus deformity and wants to know if anything else can be done.  I suggested that he discuss this with pulmonology.    Loralie Champagne 01/23/2015

## 2015-01-23 NOTE — Patient Instructions (Signed)
Your physician has requested that you have a cardiac MRI. Cardiac MRI uses a computer to create images of your heart as its beating, producing both still and moving pictures of your heart and major blood vessels. For further information please visit [email or web address] Please follow the instruction sheet given to you today for more information.  Your physician recommends that you schedule a follow-up appointment in: 1 month

## 2015-01-26 ENCOUNTER — Telehealth (HOSPITAL_COMMUNITY): Payer: Self-pay | Admitting: Cardiology

## 2015-01-26 ENCOUNTER — Other Ambulatory Visit (HOSPITAL_COMMUNITY): Payer: Self-pay | Admitting: Cardiology

## 2015-01-26 DIAGNOSIS — I270 Primary pulmonary hypertension: Secondary | ICD-10-CM

## 2015-01-26 NOTE — Telephone Encounter (Signed)
Pt to be scheduled for cardiac mri Cpt [phone] Per Dr. Aundra Dubin With pts current insurance- [employer]- per call [phone]

## 2015-02-09 ENCOUNTER — Ambulatory Visit (HOSPITAL_COMMUNITY)
Admission: RE | Admit: 2015-02-09 | Discharge: 2015-02-09 | Disposition: A | Discharge status: Home / Self Care | Payer: Managed Care, Other (non HMO) | Source: Ambulatory Visit | Attending: Internal Medicine | Admitting: Internal Medicine

## 2015-02-09 DIAGNOSIS — I517 Cardiomegaly: Secondary | ICD-10-CM | POA: Diagnosis not present

## 2015-02-09 DIAGNOSIS — I270 Primary pulmonary hypertension: Secondary | ICD-10-CM

## 2015-02-09 DIAGNOSIS — Q676 Pectus excavatum: Secondary | ICD-10-CM | POA: Insufficient documentation

## 2015-02-09 LAB — CREATININE, SERUM
CREATININE: 0.83 mg/dL (ref 0.61–1.24)
GFR calc non Af Amer: >60 mL/min (ref >60)

## 2015-02-09 MED ORDER — GADOBENATE DIMEGLUMINE 529 MG/ML IV SOLN
32.0000 mL | Freq: Once | INTRAVENOUS | Status: AC | PRN
Start: 2015-02-09 — End: 2015-02-09
  Administered 2015-02-09: 32 mL via INTRAVENOUS

## 2015-02-13 ENCOUNTER — Telehealth: Payer: Self-pay | Admitting: Pulmonary Disease

## 2015-02-13 ENCOUNTER — Ambulatory Visit: Payer: Managed Care, Other (non HMO) | Admitting: Pulmonary Disease

## 2015-02-13 DIAGNOSIS — J984 Other disorders of lung: Secondary | ICD-10-CM

## 2015-02-13 NOTE — Telephone Encounter (Signed)
I spoke with patient about results and he verbalized understanding and had no questions Order placed 

## 2015-02-13 NOTE — Telephone Encounter (Signed)
Let pt know that his ONO on bipap and 2 lpm shows a low sat of 67%,  And spent 102 min less than 88%  Will need to increase his oxygen to 4 liters during SLEEP ONLY.

## 2015-02-24 ENCOUNTER — Encounter: Payer: Self-pay | Admitting: Pulmonary Disease

## 2015-03-07 ENCOUNTER — Ambulatory Visit (HOSPITAL_COMMUNITY)
Admission: RE | Admit: 2015-03-07 | Discharge: 2015-03-07 | Disposition: A | Discharge status: Home / Self Care | Payer: Managed Care, Other (non HMO) | Source: Ambulatory Visit | Attending: Cardiology | Admitting: Cardiology

## 2015-03-07 VITALS — BP 102/62 | HR 70 | Wt 222.0 lb

## 2015-03-07 DIAGNOSIS — Z8249 Family history of ischemic heart disease and other diseases of the circulatory system: Secondary | ICD-10-CM | POA: Diagnosis not present

## 2015-03-07 DIAGNOSIS — I272 Other secondary pulmonary hypertension: Secondary | ICD-10-CM | POA: Diagnosis not present

## 2015-03-07 DIAGNOSIS — Z79899 Other long term (current) drug therapy: Secondary | ICD-10-CM | POA: Diagnosis not present

## 2015-03-07 DIAGNOSIS — I5032 Chronic diastolic (congestive) heart failure: Secondary | ICD-10-CM | POA: Insufficient documentation

## 2015-03-07 DIAGNOSIS — Q676 Pectus excavatum: Secondary | ICD-10-CM | POA: Diagnosis not present

## 2015-03-07 DIAGNOSIS — I270 Primary pulmonary hypertension: Secondary | ICD-10-CM

## 2015-03-07 DIAGNOSIS — J45909 Unspecified asthma, uncomplicated: Secondary | ICD-10-CM | POA: Insufficient documentation

## 2015-03-07 DIAGNOSIS — Z87891 Personal history of nicotine dependence: Secondary | ICD-10-CM | POA: Diagnosis not present

## 2015-03-07 DIAGNOSIS — J9612 Chronic respiratory failure with hypercapnia: Secondary | ICD-10-CM | POA: Diagnosis not present

## 2015-03-07 LAB — BASIC METABOLIC PANEL
Anion gap: 6 (ref 5–15)
BUN: 11 mg/dL (ref 6–20)
CHLORIDE: 99 mmol/L — AB (ref 101–111)
CO2: 35 mmol/L — ABNORMAL HIGH (ref 22–32)
Calcium: 9 mg/dL (ref 8.9–10.3)
Creatinine, Ser: 0.73 mg/dL (ref 0.61–1.24)
GFR calc Af Amer: >60 mL/min (ref >60)
GFR calc non Af Amer: >60 mL/min (ref >60)
Glucose, Bld: 97 mg/dL (ref 65–99)
POTASSIUM: 4 mmol/L (ref 3.5–5.1)
SODIUM: 140 mmol/L (ref 135–145)

## 2015-03-07 LAB — BRAIN NATRIURETIC PEPTIDE: B Natriuretic Peptide: 12.4 pg/mL (ref 0.0–100.0)

## 2015-03-07 NOTE — Patient Instructions (Signed)
Labs today  We will contact you in 6 months to schedule your next appointment.  Do the following things EVERYDAY: 1) Weigh yourself in the morning before breakfast. Write it down and keep it in a log. 2) Take your medicines as prescribed 3) Eat low salt foods-Limit salt (sodium) to 2000 mg per day.  4) Stay as active as you can everyday 5) Limit all fluids for the day to less than 2 liters

## 2015-03-07 NOTE — Progress Notes (Signed)
Patient ID: [name], male   DOB: 04-Jan-1981, 34 y.o.   MRN: [id number] PCP: Dr. Tollie Pizza  34 yo with history of pectus excavatum s/p repair in childhood and redo pectus repair in 6/11 returns to cardiology clinic for followup. I have not seen him in a couple of years. He has chronic hypercarbic and and nocturnal hypoxemic respiratory failure.  Dr. Gwenette Greet treats his respiratory problems.  He has been wearing Bipap at night.  He continues with maintenance pulmonary rehab at the hospital in [place].  Oxygen saturation drops with heavy exercise, so he has been using oxygen with heavy exercise at pulmonary rehab.  Weight is stable since last appointment.  Last echo was in 5/16 and showed normal LV EF but did not show the RV well.  I had him get a repeat cardiac MRI for better view of the RV.  This showed moderate to severe RV dilation with mild to moderate RV systolic dysfunction, RV EF 37%.  This looked very similar to the 2010 MRI.   Stable mild exertional dyspnea.  He recently went to [place] and was walking up and down hills without much problem (mild dyspnea with a steep hill).  Can climb a flight of steps without problem.   No dyspnea walking on flat ground.  No orthopnea/PND.  No lightheadedness or syncope. No palpitations.  Works full time in [employer] .  Labs (8/10): BNP 9, K 3.6, creatinine 0.7  Labs (2/14): K 3.8, creatinine 0.8, BNP not elevated Labs (2/16): K 3.4, creatinine 0.81   Allergies (verified):  No Known Drug Allergies   Past Medical History:  1. Pectus excavatum status post repair in [place] in childhood and repeat repair at [facility] in 6/11. Borderline Marfanoid habitus.  2. Asthma.  3. CHF: Patient was admitted in 6/10 with CHF and volume overload. He was diuresed with IV lasix and felt better. Echo suggested that the RV was enlarged. RHC was done (after several doses of IV lasix) showing mean RA 13, RV 38/12, PA 42/23 mean 34, mean PCWP 15. PVR < 2 WU. Shunt  run showed no O2 step up. Patient was hypoxic and was started O2. Cardiac MRI (6/10) showed normal LV size and systolic function EF 70%, D-shaped IV septum, moderate to severe RV dilation with RV EF 37%. The pulmonic valve opened normally. The heart was shifted leftward (pectus) with compression of the LA between the ascending and descending aorta. MRA of the chest showed normal pulmonary veins. CHF was primarily right-sided with mild pulmonary HTN noted. He is hypoxic, especially at night, so hypoxic pulmonary vasoconstriction could be playing a role. PCWP was only mildly elevated but I wonder if LA compression could be playing a role. Repeat echo after pectus surgery in 11/11 showed normal LV systolic function and size with a D-shaped interventricular septum. The LA was still compressed between ascending and descending aorta. The RV was moderately dilated with mild systolic dysfunction.  Echo (5/16) with EF 5-60%, aortic root 4.0 cm, RV poorly vsiualized.  Cardiac MRI (6/16) with pectus excavatum, left-shifted heart, LA compressed between ascending and descending aorta, LV EF 58% with D-shaped interventricular septum suggestive of RV pressure/volume overload, moderate to severe RV dilation with mild to moderate RV systolic dysfunction (RV EF 37%), dilated PA (this MRI is very similar to prior in 2010).  4. 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  5. 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 Patient is on Bipap.  Family History:  POS for HTN-Mother   Social History:  The patient lives in [place] with his wife and son.  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  He drinks 1-6 drinks per week, never having more than 3 drinks in 1 evening. No illicit drug use.  Patient states former smoker. started at age 69. only smoked socially. quit smoking at age 59.   Review of Systems  All systems reviewed and negative except as per  HPI.  Current Outpatient Prescriptions  Medication Sig Dispense Refill  . albuterol (PROAIR HFA) 108 (90 BASE) MCG/ACT inhaler Inhale 2 puffs into the lungs every 6 (six) hours as needed for wheezing or shortness of breath. 8.5 each 0  . bisoprolol (ZEBETA) 5 MG tablet Take 1 tablet (5 mg total) by mouth daily. 30 tablet 11  . cetirizine (ZYRTEC) 10 MG tablet Take 1 tablet (10 mg total) by mouth daily. 30 tablet 2  . furosemide (LASIX) 40 MG tablet Take 1 tablet (40 mg total) by mouth daily. 30 tablet 11  . isosorbide mononitrate (IMDUR) 30 MG 24 hr tablet Take 1 tablet (30 mg total) by mouth daily. 30 tablet 11  . Oxymetazoline HCl (SUDAFED OM SINUS COLD NA) Take 1 tablet by mouth as needed.    . potassium chloride SA (K-DUR,KLOR-CON) 20 MEQ tablet Take 2 tablets (40 mEq total) by mouth daily. 60 tablet 11   No current facility-administered medications for this encounter.    BP 102/62 mmHg  Pulse 70  Wt 222 lb (100.699 kg)  SpO2 92% General: NAD Neck: JVP 7 cm but +HJR, no thyromegaly or thyroid nodule.  Lungs: Clear to auscultation bilaterally with normal respiratory effort. CV: Nondisplaced PMI.  Heart regular S1/S2, no S3/S4, no murmur.  No peripheral edema.  No carotid bruit.  Normal pedal pulses.  Abdomen: Soft, nontender, no hepatosplenomegaly, no distention.  Neurologic: Alert and oriented x 3.  Psych: Normal affect. Extremities: No clubbing or cyanosis.  MSK: Chest wall with residual pectus deformity  Assessment/Plan: 1. Chronic diastolic CHF with RV failure:  Patient presented with primarily right-sided CHF in 2010, likely due to pulmonary hypertension from a combination of left atrial compression and nocturnal hypoxemia. He is now using Bipap every night. He had a redo pectus surgery in 2011. I suspect the surgery relieved some of the compression on his lungs but followup cardiac MRI in 6/16 did show continued compression of the left atrium between the ascending and  descending aorta as well as a dilated RV with RV EF 37%.  Cardiac MRI in 6/16 was very similar to the MRI in 2010. NYHA class II symptoms, possible very mild volume overload.  - Continue Bipap  - Continue current Lasix.  I advised him to cut back on dietary sodium.   - If he has a significant change in his exertional dyspnea (worsening), I would arrange for RHC. .   2. 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  RV was abnormal by MRI but stable compared to prior MRI in 2010.  3. Restrictive lung disease:  Lung disease from restriction due to pectus. He has had chronic hypercarbic respiratory failure.  This may be the major player in his dyspnea. He has had 2 operations already for his pectus deformity and wants to know if anything else can be done.  I suggested  that he discuss this with pulmonology.   Followup in 6 months   Loralie Champagne 03/07/2015

## 2015-10-05 ENCOUNTER — Encounter (HOSPITAL_COMMUNITY): Payer: Self-pay

## 2015-10-05 ENCOUNTER — Ambulatory Visit (HOSPITAL_COMMUNITY)
Admission: RE | Admit: 2015-10-05 | Discharge: 2015-10-05 | Disposition: A | Discharge status: Home / Self Care | Payer: Managed Care, Other (non HMO) | Source: Ambulatory Visit | Attending: Cardiology | Admitting: Cardiology

## 2015-10-05 VITALS — BP 104/60 | HR 72 | Wt 222.5 lb

## 2015-10-05 DIAGNOSIS — Z79899 Other long term (current) drug therapy: Secondary | ICD-10-CM | POA: Insufficient documentation

## 2015-10-05 DIAGNOSIS — Z87891 Personal history of nicotine dependence: Secondary | ICD-10-CM | POA: Insufficient documentation

## 2015-10-05 DIAGNOSIS — I272 Other secondary pulmonary hypertension: Secondary | ICD-10-CM | POA: Diagnosis not present

## 2015-10-05 DIAGNOSIS — J984 Other disorders of lung: Secondary | ICD-10-CM | POA: Diagnosis not present

## 2015-10-05 DIAGNOSIS — J45909 Unspecified asthma, uncomplicated: Secondary | ICD-10-CM | POA: Diagnosis not present

## 2015-10-05 DIAGNOSIS — Q676 Pectus excavatum: Secondary | ICD-10-CM | POA: Insufficient documentation

## 2015-10-05 DIAGNOSIS — Z8249 Family history of ischemic heart disease and other diseases of the circulatory system: Secondary | ICD-10-CM | POA: Insufficient documentation

## 2015-10-05 DIAGNOSIS — I5032 Chronic diastolic (congestive) heart failure: Secondary | ICD-10-CM | POA: Diagnosis not present

## 2015-10-05 DIAGNOSIS — I509 Heart failure, unspecified: Secondary | ICD-10-CM | POA: Diagnosis not present

## 2015-10-05 LAB — BASIC METABOLIC PANEL
ANION GAP: 12 (ref 5–15)
BUN: 11 mg/dL (ref 6–20)
CHLORIDE: 98 mmol/L — AB (ref 101–111)
CO2: 30 mmol/L (ref 22–32)
Calcium: 9.1 mg/dL (ref 8.9–10.3)
Creatinine, Ser: 0.92 mg/dL (ref 0.61–1.24)
GFR calc Af Amer: >60 mL/min (ref >60)
GFR calc non Af Amer: >60 mL/min (ref >60)
Glucose, Bld: 99 mg/dL (ref 65–99)
POTASSIUM: 3.6 mmol/L (ref 3.5–5.1)
SODIUM: 140 mmol/L (ref 135–145)

## 2015-10-05 LAB — BRAIN NATRIURETIC PEPTIDE: B NATRIURETIC PEPTIDE 5: 156.5 pg/mL — AB (ref 0.0–100.0)

## 2015-10-05 MED ORDER — POTASSIUM CHLORIDE CRYS ER 20 MEQ PO TBCR: 40.0000 meq | EXTENDED_RELEASE_TABLET | Freq: Every day | ORAL | Status: DC

## 2015-10-05 MED ORDER — ISOSORBIDE MONONITRATE ER 30 MG PO TB24: 30.0000 mg | ORAL_TABLET | Freq: Every day | ORAL | Status: DC

## 2015-10-05 MED ORDER — BISOPROLOL FUMARATE 5 MG PO TABS: 5.0000 mg | ORAL_TABLET | Freq: Every day | ORAL | Status: DC

## 2015-10-05 MED ORDER — FUROSEMIDE 40 MG PO TABS: 40.0000 mg | ORAL_TABLET | Freq: Every day | ORAL | Status: DC

## 2015-10-05 NOTE — Patient Instructions (Signed)
Labs today  We will contact you in 6 months to schedule your next appointment.  

## 2015-10-06 ENCOUNTER — Telehealth (HOSPITAL_COMMUNITY): Payer: Self-pay | Admitting: *Deleted

## 2015-10-06 ENCOUNTER — Telehealth: Payer: Self-pay | Admitting: Cardiology

## 2015-10-06 ENCOUNTER — Ambulatory Visit (INDEPENDENT_AMBULATORY_CARE_PROVIDER_SITE_OTHER): Payer: Managed Care, Other (non HMO) | Admitting: Acute Care

## 2015-10-06 ENCOUNTER — Encounter: Payer: Self-pay | Admitting: Acute Care

## 2015-10-06 VITALS — BP 114/86 | HR 74 | Ht 76.0 in | Wt 224.0 lb

## 2015-10-06 DIAGNOSIS — J4540 Moderate persistent asthma, uncomplicated: Secondary | ICD-10-CM | POA: Diagnosis not present

## 2015-10-06 DIAGNOSIS — I509 Heart failure, unspecified: Secondary | ICD-10-CM

## 2015-10-06 DIAGNOSIS — I5032 Chronic diastolic (congestive) heart failure: Secondary | ICD-10-CM | POA: Diagnosis not present

## 2015-10-06 DIAGNOSIS — J9621 Acute and chronic respiratory failure with hypoxia: Secondary | ICD-10-CM | POA: Diagnosis not present

## 2015-10-06 MED ORDER — FUROSEMIDE 40 MG PO TABS
ORAL_TABLET | ORAL | Status: DC
Start: 2015-10-06 — End: 2017-08-15

## 2015-10-06 MED ORDER — FLUTICASONE FUROATE-VILANTEROL 100-25 MCG/INH IN AEPB: 1.0000 | INHALATION_SPRAY | Freq: Every day | RESPIRATORY_TRACT | Status: DC

## 2015-10-06 NOTE — Telephone Encounter (Signed)
See lab results and Dr Claris Gladden recommendations.

## 2015-10-06 NOTE — Assessment & Plan Note (Signed)
No Clinical Signs of CHF at present. BNP done 10/05/2015 is 156.5 pg/mL compared to 12.4 pg/mL 02/2015. Echo 01/10/15: EF: 55-60%, RA with mild dilation.  Plan: Continue current regimen. Called heart failure clinic regarding increase in BNP. Dr. Aundra Dubin to call patient today after reviewing labs drawn 10/05/15 to follow up. Low salt diet. Oxygen to maintain SaO2 88-90%.

## 2015-10-06 NOTE — Telephone Encounter (Signed)
Notes Recorded by Scarlette Calico, RN on 10/06/2015 at 3:52 PM Pt aware and agreeable, repeat labs 2/6, pt hesitate to sch appt at this time as he is in the process of job hunting

## 2015-10-06 NOTE — Telephone Encounter (Signed)
-----   Message from Larey Dresser, MD sent at 10/06/2015  2:57 PM EST ----- BNP is higher than before.  Would increase Lasix to 40 qam/20 qpm empirically and repeat BMET in 1week.  Would have him come back in sooner, maybe 1-2 months as may need RHC.

## 2015-10-06 NOTE — Progress Notes (Signed)
Patient ID: [name], male   DOB: [birth year], 35 y.o.   MRN: [id number] PCP: Dr. Tollie Pizza  35 y.o. with history of pectus excavatum s/p repair in childhood and redo pectus repair in 6/11 returns to cardiology clinic for followup. He has chronic hypercarbic and and nocturnal hypoxemic respiratory failure.  He has been wearing Bipap at night.  Oxygen saturation drops with heavy exercise, so he used oxygen with heavy exercise at pulmonary rehab.  Weight is stable since last appointment.  Last echo was in 5/16 and showed normal LV EF but did not show the RV well.  I had him get a repeat cardiac MRI for better view of the RV.  This showed moderate to severe RV dilation with mild to moderate RV systolic dysfunction, RV EF 37%.  This looked very similar to the 2010 MRI.   Stable mild exertional dyspnea, no significant change.  He is mildly short of breath walking up a flight of steps or up a hill, no problems on flat ground.  He has not been exercising much recently (not going to pulmonary rehab).  No orthopnea/PND.  No lightheadedness or syncope. No palpitations.    Labs (8/10): BNP 9, K 3.6, creatinine 0.7  Labs (2/14): K 3.8, creatinine 0.8, BNP not elevated Labs (2/16): K 3.4, creatinine 0.81 Labs (6/16): K 4, creatinine 0.73, BNP 12   Allergies (verified):  No Known Drug Allergies   Past Medical History:  1. Pectus excavatum status post repair in [place] in childhood and repeat repair at [facility] in 6/11. Borderline Marfanoid habitus.  2. Asthma.  3. CHF: Patient was admitted in 6/10 with CHF and volume overload. He was diuresed with IV lasix and felt better. Echo suggested that the RV was enlarged. RHC was done (after several doses of IV lasix) showing mean RA 13, RV 38/12, PA 42/23 mean 34, mean PCWP 15. PVR < 2 WU. Shunt run showed no O2 step up. Patient was hypoxic and was started O2. Cardiac MRI (6/10) showed normal LV size and systolic function EF [id number], D-shaped IV septum, moderate to severe RV  dilation with RV EF 37%. The pulmonic valve opened normally. The heart was shifted leftward (pectus) with compression of the LA between the ascending and descending aorta. MRA of the chest showed normal pulmonary veins. CHF was primarily right-sided with mild pulmonary HTN noted. He is hypoxic, especially at night, so hypoxic pulmonary vasoconstriction could be playing a role. PCWP was only mildly elevated but I wonder if LA compression could be playing a role. Repeat echo after pectus surgery in 11/11 showed normal LV systolic function and size with a D-shaped interventricular septum. The LA was still compressed between ascending and descending aorta. The RV was moderately dilated with mild systolic dysfunction.  Echo (5/16) with EF 5-60%, aortic root 4.0 cm, RV poorly vsiualized.  Cardiac MRI (6/16) with pectus excavatum, left-shifted heart, LA compressed between ascending and descending aorta, LV EF 58% with D-shaped interventricular septum suggestive of RV pressure/volume overload, moderate to severe RV dilation with mild to moderate RV systolic dysfunction (RV EF 37%), dilated PA (this MRI is very similar to prior in 2010).  4. 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  5. 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 Patient is on Bipap.   Family History:  POS for HTN-Mother   Social History:  The patient lives in [place] with his wife and son.  He works full time in sporting good  sales.  He drinks 1-6 drinks per week, never having more than 3 drinks in 1 evening. No illicit drug use.  Patient states former smoker. started at age 35. only smoked socially. quit smoking at age 5.   Review of Systems  All systems reviewed and negative except as per HPI.  Current Outpatient Prescriptions  Medication Sig Dispense Refill  . bisoprolol (ZEBETA) 5 MG tablet Take 1 tablet (5 mg total) by mouth daily. 90 tablet 3  . cetirizine  (ZYRTEC) 10 MG tablet Take 1 tablet (10 mg total) by mouth daily. 30 tablet 2  . isosorbide mononitrate (IMDUR) 30 MG 24 hr tablet Take 1 tablet (30 mg total) by mouth daily. 90 tablet 3  . potassium chloride SA (K-DUR,KLOR-CON) 20 MEQ tablet Take 2 tablets (40 mEq total) by mouth daily. 120 tablet 3  . albuterol (PROAIR HFA) 108 (90 BASE) MCG/ACT inhaler Inhale 2 puffs into the lungs every 6 (six) hours as needed for wheezing or shortness of breath. 8.5 each 0  . Fluticasone Furoate-Vilanterol 100-25 MCG/INH AEPB Inhale 1 puff into the lungs daily. 1 each 5  . furosemide (LASIX) 40 MG tablet Take 1 tab in AM and 1/2 tab in PM 90 tablet 3  . Oxymetazoline HCl (SUDAFED OM SINUS COLD NA) Take 1 tablet by mouth as needed. Reported on 10/05/2015     No current facility-administered medications for this encounter.    BP 104/60 mmHg  Pulse 72  Wt 222 lb 8 oz (100.925 kg)  SpO2 97% General: NAD Neck: JVP 7 cm, no thyromegaly or thyroid nodule.  Lungs: Clear to auscultation bilaterally with normal respiratory effort. CV: Nondisplaced PMI.  Heart regular S1/S2, no S3/S4, no murmur.  No peripheral edema.  No carotid bruit.  Normal pedal pulses.  Abdomen: Soft, nontender, no hepatosplenomegaly, no distention.  Neurologic: Alert and oriented x 3.  Psych: Normal affect. Extremities: No clubbing or cyanosis.  MSK: Chest wall with residual pectus deformity  Assessment/Plan: 1. Chronic diastolic CHF with RV failure:  Patient presented with primarily right-sided CHF in 2010, likely due to pulmonary hypertension from a combination of left atrial compression and nocturnal hypoxemia. He is now using Bipap every night. He had a redo pectus surgery in 2011. I suspect the surgery relieved some of the compression on his lungs but followup cardiac MRI in 6/16 did show continued compression of the left atrium between the ascending and descending aorta as well as a dilated RV with RV EF 37%.  Cardiac MRI in 6/16 was  very similar to the MRI in 2010. NYHA class II symptoms, volume looks ok.  - Continue Bipap  - Continue current Lasix.  I advised him to cut back on dietary sodium.  BMET/BNP today.  - If he has a significant change in his exertional dyspnea (worsening), I would arrange for RHC.    2. 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  RV was abnormal by MRI but stable compared to prior MRI in 2010.  3. Restrictive lung disease:  Lung disease from restriction due to pectus. He has had chronic hypercarbic respiratory failure.  This may be the major player in his dyspnea. He has had 2 operations already for his pectus deformity and wants to know if anything else can be done.  I suggested that he discuss this with pulmonology, has an  appointment this week.   Followup in 6 months   Loralie Champagne 10/06/2015

## 2015-10-06 NOTE — Telephone Encounter (Signed)
Calling because [name] Bmp Levels are up

## 2015-10-06 NOTE — Patient Instructions (Addendum)
Continue to increase your activity as you are able. Start pulmonary rehab when you are able. Stop the Symbicort Try Breo sample. Use 1 puff once daily.  We will send a prescription to your pharmacy. Rinse mouth with water after use. The nurse will review use of the inhaler. Use your Pro Air rescue medication as needed. We will give you patient assistance forms. Continue your BiPap with oxygen every night. Use your oxygen as you need it. Monitor saturations and keep at 88-90% The heart Failure clinic will follow up with you regarding the labs done yesterday. Follow up with Dr. Lake Bells at first available appointment. Please contact office for sooner follow up if symptoms do not improve or worsen or seek emergency care.

## 2015-10-06 NOTE — Progress Notes (Signed)
Subjective:    Patient ID: [name], male    DOB: 08-05-1981, 35 y.o.   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  He also has known asthma.   Significant Studies/Events:  01/10/15 Echo: LV EF: 55% -  60%, mild RA dilation.  10/06/2015  Presents today for for 6 month follow up. Patient is doing well. He has not had any asthma flares He is not taking his maintenance Symbicort for his asthma because he does not feel it helps. It also gives him sores in his mouth. Cost is  an issue as he has just lost his job. He continues to follow up with the heart failure clinic. He saw Dr. Aundra Dubin 10/04/2014, who suggested he get established with Dr. Lake Bells as his primary pulmonary physician as Dr. Gwenette Greet is no longer with the practice. He is wearing his BiPAP at bedtime with oxygen. He rarely uses his oxygen during the day but does have oxygen at home if needed. He states that he keeps his saturations above 88-90% at all times. No fever, hemoptysis, chest pain, orthopnea, or increased edema. He is considering applying for disability.  Past Medical History  Diagnosis Date  . Pectus excavatum   . Asthma   . CHF (congestive heart failure) ([place])   . Restrictive lung disease   . Chronic hypercapnic respiratory failure (HCC)     nocturnal  hypoxemic resp failure, on bipap    Current outpatient prescriptions:  .  albuterol (PROAIR HFA) 108 (90 BASE) MCG/ACT inhaler, Inhale 2 puffs into the lungs every 6 (six) hours as needed for wheezing or shortness of breath., Disp: 8.5 each, Rfl: 0 .  bisoprolol (ZEBETA) 5 MG tablet, Take 1 tablet (5 mg total) by mouth daily., Disp: 90 tablet, Rfl: 3 .  cetirizine (ZYRTEC) 10 MG tablet, Take 1 tablet (10 mg total) by mouth daily., Disp: 30 tablet, Rfl: 2 .  furosemide (LASIX) 40 MG tablet, Take 1 tablet (40 mg total) by mouth daily., Disp: 90  tablet, Rfl: 3 .  isosorbide mononitrate (IMDUR) 30 MG 24 hr tablet, Take 1 tablet (30 mg total) by mouth daily., Disp: 90 tablet, Rfl: 3 .  Oxymetazoline HCl (SUDAFED OM SINUS COLD NA), Take 1 tablet by mouth as needed. Reported on 10/05/2015, Disp: , Rfl:  .  potassium chloride SA (K-DUR,KLOR-CON) 20 MEQ tablet, Take 2 tablets (40 mEq total) by mouth daily., Disp: 120 tablet, Rfl: 3 .  Fluticasone Furoate-Vilanterol 100-25 MCG/INH AEPB, Inhale 1 puff into the lungs daily., Disp: 1 each, Rfl: 5  No Known Allergies    Review of Systems Constitutional:   No  weight loss, night sweats,  Fevers, chills, fatigue, or  lassitude.  HEENT:   No headaches,  Difficulty swallowing,  Tooth/dental problems, or  Sore throat,                No sneezing, itching, ear ache, nasal congestion, post nasal drip,   CV:  No chest pain,  Orthopnea, PND, swelling in lower extremities, anasarca, dizziness, palpitations, syncope.   GI  No heartburn, indigestion, abdominal pain, nausea, vomiting, diarrhea, change in bowel habits, loss of appetite, bloody stools.   Resp: + shortness of breath with exertion not at rest.  No excess mucus, no productive cough,  No non-productive cough,  No coughing up of blood.  No change in color of mucus.  No wheezing.  + chest wall deformity  Skin: no rash or lesions.  GU: no dysuria, change in color of urine, no urgency or frequency.  No flank pain, no hematuria   MS:  No joint pain or swelling.  No decreased range of motion.  No back pain.  Psych:  No change in mood or affect. No depression or anxiety.  No memory loss.        Objective:   Physical Exam BP 114/86 mmHg  Pulse 74  Ht 6\' 4"  (1.93 m)  Wt 224 lb (101.606 kg)  BMI 27.28 kg/m2  SpO2 93%  Physical Exam:  General- No distress,  A&Ox3 ENT: No sinus tenderness, TM clear, pale nasal mucosa, no oral exudate,no post nasal drip, no LAN Cardiac: S1, S2, regular rate and rhythm, no murmur Chest: No wheeze/ rales/  dullness; diminished per bases bilaterally,no accessory muscle use, no nasal flaring, no sternal retractions, S/P pectus deformity repair at 35 years old. Abd.: Soft Non-tender Ext: No edema Neuro:  normal strength Skin: No rashes, warm and dry Psych: normal mood and behavior      Assessment & Plan:

## 2015-10-06 NOTE — Telephone Encounter (Signed)
I spoke with Brian Form NP from Pulmonary and she wanted to make sure that we are aware of the [name]'s BNP result from 10/05/15. The [name] was seen in the CHF clinic yesterday and had labs drawn at that time.  I will forward this message to Dr Aundra Dubin to review the [name]'s lab results.

## 2015-10-06 NOTE — Assessment & Plan Note (Signed)
No recent flares. Plan: Use oxygen with activity and keep your saturations 88-90%. Wear oxygen at night with BiPAP. Follow up with Dr. Lake Bells to establish with him as a primary patient. Please contact office for sooner follow up if symptoms do not improve or worsen or seek emergency care

## 2015-10-06 NOTE — Assessment & Plan Note (Signed)
Not using Symbicort for asthma maintenance.  States he gets very little benefit, and it promotes sores in his mouth. Cost is also an issue. Chronic sinusitis is not an issue at present.  Plan: Stop the Symbicort Try Breo sample. Use 1 puff once daily.  Rinse mouth with water after use. This will be you maintenance medication. The nurse will review use of the inhaler. Use your Pro Air rescue medication as needed for shortness of breath or wheezing.. We will give you patient assistance forms to see if you qualify for help with costs. Follow up with Dr. Lake Bells to establish as primary.

## 2015-10-09 ENCOUNTER — Telehealth (HOSPITAL_COMMUNITY): Payer: Self-pay

## 2015-10-09 NOTE — Telephone Encounter (Signed)
[name] from [facility] in [place] calling to verify that Dr. Aundra Dubin told patient that it was ok if her was placed on Adderall. Dr. Aundra Dubin said that it was ok if they felt it was necessary.

## 2015-10-09 NOTE — Progress Notes (Signed)
Reviewed, I agree with this plan of care 

## 2015-10-16 ENCOUNTER — Other Ambulatory Visit (HOSPITAL_COMMUNITY): Payer: Managed Care, Other (non HMO)

## 2015-10-27 ENCOUNTER — Ambulatory Visit (HOSPITAL_COMMUNITY)
Admission: RE | Admit: 2015-10-27 | Discharge: 2015-10-27 | Disposition: A | Discharge status: Home / Self Care | Payer: Managed Care, Other (non HMO) | Source: Ambulatory Visit | Attending: Cardiology | Admitting: Cardiology

## 2015-10-27 ENCOUNTER — Other Ambulatory Visit (HOSPITAL_COMMUNITY): Payer: Managed Care, Other (non HMO)

## 2015-10-27 DIAGNOSIS — I5032 Chronic diastolic (congestive) heart failure: Secondary | ICD-10-CM | POA: Insufficient documentation

## 2015-10-27 LAB — BASIC METABOLIC PANEL
Anion gap: 9 (ref 5–15)
BUN: 12 mg/dL (ref 6–20)
CHLORIDE: 101 mmol/L (ref 101–111)
CO2: 29 mmol/L (ref 22–32)
Calcium: 9.1 mg/dL (ref 8.9–10.3)
Creatinine, Ser: 0.81 mg/dL (ref 0.61–1.24)
GFR calc non Af Amer: >60 mL/min (ref >60)
Glucose, Bld: 103 mg/dL — ABNORMAL HIGH (ref 65–99)
Potassium: 3.8 mmol/L (ref 3.5–5.1)
SODIUM: 139 mmol/L (ref 135–145)

## 2015-10-27 LAB — BRAIN NATRIURETIC PEPTIDE: B NATRIURETIC PEPTIDE 5: 17.3 pg/mL (ref 0.0–100.0)

## 2015-11-14 ENCOUNTER — Other Ambulatory Visit: Payer: Self-pay | Admitting: Physician Assistant

## 2015-12-05 ENCOUNTER — Encounter: Payer: Self-pay | Admitting: Pulmonary Disease

## 2015-12-05 ENCOUNTER — Ambulatory Visit (INDEPENDENT_AMBULATORY_CARE_PROVIDER_SITE_OTHER): Payer: Managed Care, Other (non HMO) | Admitting: Pulmonary Disease

## 2015-12-05 VITALS — BP 128/70 | HR 91 | Ht 76.0 in | Wt 213.0 lb

## 2015-12-05 DIAGNOSIS — I272 Other secondary pulmonary hypertension: Secondary | ICD-10-CM

## 2015-12-05 DIAGNOSIS — J4540 Moderate persistent asthma, uncomplicated: Secondary | ICD-10-CM | POA: Diagnosis not present

## 2015-12-05 DIAGNOSIS — I5032 Chronic diastolic (congestive) heart failure: Secondary | ICD-10-CM

## 2015-12-05 DIAGNOSIS — J984 Other disorders of lung: Secondary | ICD-10-CM | POA: Diagnosis not present

## 2015-12-05 NOTE — Assessment & Plan Note (Signed)
He has chronic hypercapnic respiratory failure secondary to his chest wall issues. He has been stable on his current regimen of BiPAP and he has no symptoms suggestive of under treatment in that regard.  Plan: Continue BiPAP at night with oxygen as prescribed

## 2015-12-05 NOTE — Assessment & Plan Note (Signed)
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 Continue follow-up with cardiology

## 2015-12-05 NOTE — Assessment & Plan Note (Signed)
He appears euvolemic on exam today. I recommend he continue taking his medications as prescribed by Dr. Algernon Huxley.

## 2015-12-05 NOTE — Assessment & Plan Note (Signed)
He had severe airflow obstruction noted on his pulmonary function testing at [facility] in 2012. 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 However, it's hard to ignore an FEV1 to FVC ratio of 50% so I think there probably is some degree of airflow obstruction albeit his asthma symptoms overall are mild. He's been having fairly significant side effects from the dry powder inhaler and he has thrush on exam today so think it's worthwhile changing to a missed.  Plan: Use Qvar 2 puffs twice a day with a spacer, sample provided Stop Breo Follow-up 6 months

## 2015-12-05 NOTE — Patient Instructions (Signed)
Keep taking your cardiac medications as you're doing Stop Breo Try taking Qvar 2 puffs in the morning and 2 puffs at night no matter how you feel Call me in about 2 weeks to let me know if he think the Qvar is okay, if so then I will change it to that from [facility] I will see you back in 6 months or sooner if needed

## 2015-12-05 NOTE — Progress Notes (Signed)
Subjective:    Patient ID: [name], male    DOB: Oct 06, 1980, 35 y.o.   MRN: [id number]  Synopsis: This is a 35 year old gentleman with a past medical history significant for pectus excavatum which has been repaired twice. The first was in childhood at age 35 the second was in 2011 as a redo procedure at [facility]. The redo surgery was performed because he thinks that this was because the chest wall was pressing on his heart. He has been followed by the pulmonary clinic for asthma as well as hypercarbic respiratory failure secondary to his chest wall issues. He also has a history significant for pulmonary hypertension which has been felt to be due to his chronic hypercapnic respiratory failure and hypoxemic respiratory failure as well as some degree of left atrial compression from his chest wall issues. Right heart catheterization performed in 2011 showed no elevation of pulmonary vascular resistance.  He started on BIPAP around 02/2009.    HPI  Chief Complaint  Patient presents with  . Follow-up    former [facility] pt being treated for asthma, restrictive lung disease.  pt c/o baseline sob and wheezing with exertion.      [name] followed with [name] for 5 years for chronic hypercarbic respiratory failure.  He still uses the machine every night and has no problems with it for now.  He says it works fine wihtout difficulty.  He gets it from [place].  He runs 3L Oxygen at night with it, but he has never needed.  He says he stays pretty active.  He has a 36 year old around and can keep up with him.  He exercises three times a week at [facility] center and works out there. He wears oxygen when he works out there.  He typically does just cardio work, some weights: squats, some weight work.  This goes OK and he has been fine doing that.  He does a rowing machine and a recumbant bike from time to time.    [name] labeled him as having asthma as well.  However [name] thinks  that he has most of his problems from his chest wall issues.  Mostly he feels this way because he has never had severe bronchospasm that he is aware of.  He does have allergic rhinitis symptoms that will flare up from time to time, but he doesn't think that that this is severe.    No breathing difficulty right now.  No sleepiness.   Past Medical History  Diagnosis Date  . Pectus excavatum   . Asthma   . CHF (congestive heart failure) ([place])   . Restrictive lung disease   . Chronic hypercapnic respiratory failure (HCC)     nocturnal  hypoxemic resp failure, on bipap     Review of Systems     Objective:   Physical Exam  Filed Vitals:   12/05/15 1529  BP: 128/70  Pulse: 91  Height: 6\' 4"  (1.93 m)  Weight: 213 lb (96.616 kg)  SpO2: 96%   room air   Gen: well appearing, no acute distress HENT: NCAT, OP clear, neck supple without masses Eyes: PERRL, EOMi Lymph: no cervical lymphadenopathy Chest: Narrowing of chest at the costophrenic angle, there is a scar around the sternum consistent with his prior pectus excavatum surgeries Pulmonary diminished airflow in the left lung, normal flow in the right lung CV: RRR, no mgr, no JVD GI: BS+, soft, nontender, no hsm Derm: no rash or  skin breakdown MSK: normal bulk and tone Neuro: A&Ox4, CN II-XII intact, strength 5/5 in all 4 extremities Psyche: normal mood and affect    Tue Oct 30, 2010 12:20 PM EST Patient: [name], [name] [id number] ______________________________________________________________________________  Pulmonary Labs: Final 10/30/2010 12:20 [place]  PULMONARY FUNCTION TEST    PRE POST  Ref Best %Pred Best %Pred %Chg FVC Liters 6.29 1.79 28 FEV1 Liters 5.07 0.90 18 FEV1/FVC % 81 50 FEV1/SVC % 50 FEF25-75% L/sec 4.93 0.34 7 PEF L/sec 11.10 3.06 28 MVV L/min 198 37 19 Vtg Liters 2.47 VC Liters 6.29 1.79 28 TLC Liters 8.11 2.68 33 RV Liters 1.95 0.89 46 FRC PL Liters 4.09 1.78 44 ERV Liters 2.08 0.64 31 IC  Liters 4.15 0.90 22 DLCO mL/mmHg/min 33.6 22.7 68 VA Liters 2.51 IVC Liters 1.54 BHT Sec 11.95       Assessment & Plan:  Pulmonary hypertension (HCC) 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 Continue follow-up with cardiology  Congestive heart failure ([place]) He appears euvolemic on exam today. I recommend he continue taking his medications as prescribed by Dr. Algernon Huxley.  Moderate persistent asthma in adult without complication He had severe airflow obstruction noted on his pulmonary function testing at [facility] in 2012. 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 However, it's hard to ignore an FEV1 to FVC ratio of 50% so I think there probably is some degree of airflow obstruction albeit his asthma symptoms overall are mild. He's been having fairly significant side effects from the dry powder inhaler and he has thrush on exam today so think it's worthwhile changing to a missed.  Plan: Use Qvar 2 puffs twice a day with a spacer, sample provided Stop Breo Follow-up 6 months  Restrictive lung disease He has chronic hypercapnic respiratory failure secondary to his chest wall issues. He has been stable on his current regimen of BiPAP and he has no symptoms suggestive of under treatment in that regard.  Plan: Continue BiPAP at night with oxygen as prescribed  > 50% of time spent face to face in a 41 minute visit   Current outpatient prescriptions:  .  albuterol (PROAIR HFA) 108 (90 BASE) MCG/ACT inhaler, Inhale 2 puffs into the lungs every 6 (six) hours as needed for wheezing or shortness of breath., Disp: 8.5 each, Rfl: 0 .  amphetamine-dextroamphetamine (ADDERALL) 10 MG tablet, Take 10 mg by mouth 4 (four) times daily as needed., Disp: , Rfl:  .  bisoprolol (ZEBETA) 5 MG tablet,  Take 1 tablet (5 mg total) by mouth daily., Disp: 90 tablet, Rfl: 3 .  cetirizine (ZYRTEC) 10 MG tablet, Take 1 tablet (10 mg total) by mouth daily., Disp: 30 tablet, Rfl: 2 .  Fluticasone Furoate-Vilanterol 100-25 MCG/INH AEPB, Inhale 1 puff into the lungs daily., Disp: 1 each, Rfl: 5 .  furosemide (LASIX) 40 MG tablet, Take 1 tab in AM and 1/2 tab in PM, Disp: 90 tablet, Rfl: 3 .  isosorbide mononitrate (IMDUR) 30 MG 24 hr tablet, Take 1 tablet (30 mg total) by mouth daily., Disp: 90 tablet, Rfl: 3 .  Oxymetazoline HCl (SUDAFED OM SINUS COLD NA), Take 1 tablet by mouth as needed. Reported on 10/05/2015, Disp: , Rfl:  .  potassium chloride SA (K-DUR,KLOR-CON) 20 MEQ tablet, Take 2 tablets (40 mEq  total) by mouth daily., Disp: 120 tablet, Rfl: 3

## 2016-06-28 ENCOUNTER — Other Ambulatory Visit (HOSPITAL_COMMUNITY): Payer: Self-pay | Admitting: Cardiology

## 2016-09-20 ENCOUNTER — Other Ambulatory Visit (HOSPITAL_COMMUNITY): Payer: Self-pay | Admitting: Cardiology

## 2016-09-20 DIAGNOSIS — I509 Heart failure, unspecified: Secondary | ICD-10-CM

## 2016-10-14 ENCOUNTER — Encounter: Payer: Self-pay | Admitting: Pulmonary Disease

## 2016-10-14 ENCOUNTER — Ambulatory Visit (INDEPENDENT_AMBULATORY_CARE_PROVIDER_SITE_OTHER)
Admission: RE | Admit: 2016-10-14 | Discharge: 2016-10-14 | Disposition: A | Discharge status: Home / Self Care | Payer: 59 | Source: Ambulatory Visit | Attending: Pulmonary Disease | Admitting: Pulmonary Disease

## 2016-10-14 ENCOUNTER — Ambulatory Visit (INDEPENDENT_AMBULATORY_CARE_PROVIDER_SITE_OTHER): Payer: 59 | Admitting: Pulmonary Disease

## 2016-10-14 VITALS — BP 118/80 | HR 98 | Ht 76.0 in | Wt 205.0 lb

## 2016-10-14 DIAGNOSIS — J984 Other disorders of lung: Secondary | ICD-10-CM

## 2016-10-14 DIAGNOSIS — J45909 Unspecified asthma, uncomplicated: Secondary | ICD-10-CM

## 2016-10-14 DIAGNOSIS — R0602 Shortness of breath: Secondary | ICD-10-CM

## 2016-10-14 LAB — POCT INFLUENZA A/B
INFLUENZA A, POC: NEGATIVE
INFLUENZA B, POC: NEGATIVE

## 2016-10-14 MED ORDER — HYDROCODONE-HOMATROPINE 5-1.5 MG/5ML PO SYRP: 5.0000 mL | ORAL_SOLUTION | Freq: Every evening | ORAL | 0 refills | Status: DC | PRN

## 2016-10-14 MED ORDER — DOXYCYCLINE HYCLATE 100 MG PO TABS: 100.0000 mg | ORAL_TABLET | Freq: Two times a day (BID) | ORAL | 0 refills | Status: DC

## 2016-10-14 MED ORDER — PREDNISONE 10 MG PO TABS: ORAL_TABLET | ORAL | 0 refills | Status: DC

## 2016-10-14 NOTE — Assessment & Plan Note (Signed)
Cont using Bipap.  He has had the machine for 5-6 yrs.  Likely will benefit from a new BiPAP machine. Currently, his machine is working okay.

## 2016-10-14 NOTE — Assessment & Plan Note (Signed)
Patient with restrictive ventilatory defect related to pectus excavatum. Also with "asthma" which usually flares up with a change in weather. Recent wheezing, cough, congestion. Currently has wheezing bilaterally. He does not appear sick enough to be going to the emergency room. 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  Influenza test today was negative. Chest x-ray done today, no acute infiltrates seen. Chest x-ray personally reviewed by me.  Plan: 1. Patient has an acute exacerbation of asthmatic bronchitis.  Plan to start Prednisone taper: Take Prednisone 10 mg/tab --  Start with 4 tabs in the morning for 3 days then 3 tabs in the morning for 3 days then 2 tabs in the morning for 3 days then 1 tab in the morning for 3 days then stop.  Please call the office if you do not get better while on the prednisone taper.  2. Doxycycline, 100 mg, one tablet twice a day if he is not better in the next 24-48 hours. 3. Obviously if he sicker, he needs to call us back or go to the emergency room and have himself checked for the flu. 4. Resume reflux medicine. He takes Protonix over-the-counter. 5. Resume fluticasone nasal spray. 2 squirts per nostril at bedtime. 6. Continue using your BiPAP for RVD.

## 2016-10-14 NOTE — Progress Notes (Signed)
Subjective:    Patient ID: [name], male    DOB: [birth year], 36 y.o.   MRN: [id number]  HPI Patient is being seen in the office for asthma as well as restrictive ventilatory defect related to his pectus excavatum. He has gotten surgery for his pectus.  Patient was his usual self, acutely felt congested and could not breathe last night. He had difficulty using his BiPAP last night because of his congestion. Has cough, wheezing the last 12 hours. Reflux is also not controlled. Has sinus congestion.  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 Wife and children are doing okay.     Review of Systems  Constitutional: Negative.   HENT: Negative.   Eyes: Negative.   Respiratory: Positive for cough, chest tightness, shortness of breath and wheezing.   Cardiovascular: Negative.   Gastrointestinal: Negative.   Endocrine: Negative.   Genitourinary: Negative.   Musculoskeletal: Negative.   Skin: Negative.   Allergic/Immunologic: Negative.   Neurological: Negative.   Hematological: Negative.   Psychiatric/Behavioral: Negative.   All other systems reviewed and are negative.      Objective:   Physical Exam  Vitals:  Vitals:   10/14/16 1049  BP: 118/80  Pulse: 98  SpO2: 92%  Weight: 205 lb (93 kg)  Height: 6\' 4"  (1.93 m)    Constitutional/General:  Pleasant, well-nourished, well-developed, not in any distress,  Comfortably seating.  Well kempt  Body mass index is 24.95 kg/m. Wt Readings from Last 3 Encounters:  10/14/16 205 lb (93 kg)  12/05/15 213 lb (96.6 kg)  10/06/15 224 lb (101.6 kg)      HEENT: Pupils equal and reactive to light and accommodation. Anicteric sclerae. Normal nasal mucosa.   No oral  lesions,  mouth clear,  oropharynx clear, no postnasal drip. (-) Oral thrush. No dental caries.  Airway - Mallampati class III  Neck: No masses. Midline trachea. No JVD, (-) LAD. (-) bruits appreciated.  Respiratory/Chest: Grossly normal chest. (-) deformity.  (-) Accessory muscle use.  Symmetric expansion. (+) pectus built (-) Tenderness on palpation.  Resonant on percussion.  Diminished BS on both lower lung zones. (-)crackles, rhonchi Wheezing in both upper lung fields. (-) egophony  Cardiovascular: Regular rate and  rhythm, heart sounds normal, no murmur or gallops, no peripheral edema  Gastrointestinal:  Normal bowel sounds. Soft, non-tender. No hepatosplenomegaly.  (-) masses.   Musculoskeletal:  Normal muscle tone. Normal gait.   Extremities: Grossly normal. (-) clubbing, cyanosis.  (-) edema  Skin: (-) rash,lesions seen.   Neurological/Psychiatric : alert, oriented to time, place, person. Normal mood and affect          Assessment & Plan:  Asthmatic bronchitis Patient with restrictive ventilatory defect related to pectus excavatum. Also with "asthma" which usually flares up with a change in weather. Recent wheezing, cough, congestion. Currently has wheezing bilaterally. He does not appear sick enough to be going to the emergency room. 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  Influenza test today was negative. Chest x-ray done today, no acute infiltrates seen. Chest x-ray personally reviewed by me.  Plan: 1. Patient has an acute exacerbation of asthmatic bronchitis.  Plan to start Prednisone taper: Take Prednisone 10 mg/tab --  Start with 4 tabs in the morning for 3 days then 3 tabs in the morning for 3 days then 2 tabs in the morning for 3 days then 1 tab in the morning for 3 days then stop.  Please call the office  if you do not get better while on the prednisone taper.  2. Doxycycline, 100 mg, one tablet twice a day if he is not better in the next 24-48 hours. 3. Obviously if he sicker, he needs to call us back or go to the emergency room and have himself checked for the flu. 4. Resume reflux medicine. He takes Protonix over-the-counter. 5. Resume fluticasone nasal spray. 2 squirts per nostril at bedtime. 6.  Continue using your BiPAP for RVD.   Restrictive lung disease Cont using Bipap.  He has had the machine for 5-6 yrs.  Likely will benefit from a new BiPAP machine. Currently, his machine is working okay.  I spent at least 25 minutes with the patient today and more than 50% was spent counseling the patient regarding disease and management and facilitating labs and medications.      Monica Becton, MD 10/14/2016, 12:11 PM [facility] Pager [phone] After 3 pm or if no answer, call [phone]

## 2016-10-14 NOTE — Patient Instructions (Addendum)
It was a pleasure taking care of you today!  You are diagnosed with bronchitis.  Make sure you take your antibiotics:  Doxycycline 100 mg/tab, 1 tab, 2x/day  You have  an acute exacerbation of asthma.  Plan to start Prednisone taper: Take Prednisone 10 mg/tab --  Start with 4 tabs in the morning for 3 days then 3 tabs in the morning for 3 days then 2 tabs in the morning for 3 days then 1 tab in the morning for 3 days then stop.   Hycodan, 5 mls, as needed at bedtime.  d Please call the office if you do not get better while on the prednisone taper or antibiotics.   Please call the office if you are having adverse reaction to meds/antibiotics.  Resume taking your reflux medicine. Resume your nasal spray, 2 squirts per nostril at bedtime.  Return to clinic as needed with Dr. Pennie Banter

## 2017-07-04 ENCOUNTER — Other Ambulatory Visit (HOSPITAL_COMMUNITY): Payer: Self-pay | Admitting: Cardiology

## 2017-07-18 ENCOUNTER — Telehealth: Payer: Self-pay | Admitting: Physician Assistant

## 2017-07-18 ENCOUNTER — Other Ambulatory Visit: Payer: Self-pay | Admitting: Physician Assistant

## 2017-07-18 MED ORDER — POTASSIUM CHLORIDE CRYS ER 20 MEQ PO TBCR: 40.0000 meq | EXTENDED_RELEASE_TABLET | Freq: Every day | ORAL | 0 refills | Status: DC

## 2017-07-18 NOTE — Telephone Encounter (Signed)
Given patient 30 day supply of KCl supplement until his next appt later this month. He will need his appt before any more refill can be given. Verified with patient he is on lasix 40mg  AM and 20mg  PM, he takes 39meq of KCl daily.   Hilbert Corrigan PA Pager: [phone]

## 2017-07-29 ENCOUNTER — Ambulatory Visit: Payer: 59 | Admitting: Adult Health

## 2017-07-29 ENCOUNTER — Encounter: Payer: Self-pay | Admitting: Adult Health

## 2017-07-29 ENCOUNTER — Ambulatory Visit (INDEPENDENT_AMBULATORY_CARE_PROVIDER_SITE_OTHER)
Admission: RE | Admit: 2017-07-29 | Discharge: 2017-07-29 | Disposition: A | Discharge status: Home / Self Care | Payer: 59 | Source: Ambulatory Visit | Attending: Adult Health | Admitting: Adult Health

## 2017-07-29 ENCOUNTER — Other Ambulatory Visit (INDEPENDENT_AMBULATORY_CARE_PROVIDER_SITE_OTHER): Payer: 59

## 2017-07-29 VITALS — BP 124/76 | HR 75 | Ht 76.0 in | Wt 215.6 lb

## 2017-07-29 DIAGNOSIS — J4540 Moderate persistent asthma, uncomplicated: Secondary | ICD-10-CM | POA: Diagnosis not present

## 2017-07-29 DIAGNOSIS — J9612 Chronic respiratory failure with hypercapnia: Secondary | ICD-10-CM | POA: Diagnosis not present

## 2017-07-29 DIAGNOSIS — J984 Other disorders of lung: Secondary | ICD-10-CM

## 2017-07-29 DIAGNOSIS — J45909 Unspecified asthma, uncomplicated: Secondary | ICD-10-CM

## 2017-07-29 DIAGNOSIS — J9611 Chronic respiratory failure with hypoxia: Secondary | ICD-10-CM

## 2017-07-29 LAB — BRAIN NATRIURETIC PEPTIDE: Pro B Natriuretic peptide (BNP): 6 pg/mL (ref 0.0–100.0)

## 2017-07-29 LAB — CBC WITH DIFFERENTIAL/PLATELET
BASOS PCT: 0.2 % (ref 0.0–3.0)
Basophils Absolute: 0 10*3/uL (ref 0.0–0.1)
EOS PCT: 1 % (ref 0.0–5.0)
Eosinophils Absolute: 0.1 10*3/uL (ref 0.0–0.7)
HCT: 46 % (ref 39.0–52.0)
Hemoglobin: 15.2 g/dL (ref 13.0–17.0)
LYMPHS ABS: 1.3 10*3/uL (ref 0.7–4.0)
Lymphocytes Relative: 16.5 % (ref 12.0–46.0)
MCHC: 33.2 g/dL (ref 30.0–36.0)
MCV: 92.3 fl (ref 78.0–100.0)
MONO ABS: 0.6 10*3/uL (ref 0.1–1.0)
Monocytes Relative: 8.4 % (ref 3.0–12.0)
NEUTROS ABS: 5.6 10*3/uL (ref 1.4–7.7)
NEUTROS PCT: 73.9 % (ref 43.0–77.0)
PLATELETS: 189 10*3/uL (ref 150.0–400.0)
RBC: 4.98 Mil/uL (ref 4.22–5.81)
RDW: 13.7 % (ref 11.5–15.5)
WBC: 7.6 10*3/uL (ref 4.0–10.5)

## 2017-07-29 LAB — BASIC METABOLIC PANEL
BUN: 9 mg/dL (ref 6–23)
CHLORIDE: 93 meq/L — AB (ref 96–112)
CO2: 45 mEq/L — ABNORMAL HIGH (ref 19–32)
Calcium: 9.3 mg/dL (ref 8.4–10.5)
Creatinine, Ser: 0.82 mg/dL (ref 0.40–1.50)
GFR: 112.09 mL/min (ref >60.00)
GLUCOSE: 94 mg/dL (ref 70–99)
POTASSIUM: 4.1 meq/L (ref 3.5–5.1)
Sodium: 141 mEq/L (ref 135–145)

## 2017-07-29 MED ORDER — PREDNISONE 10 MG PO TABS: ORAL_TABLET | ORAL | 0 refills | Status: DC

## 2017-07-29 MED ORDER — AZITHROMYCIN 250 MG PO TABS: ORAL_TABLET | ORAL | 0 refills | Status: AC

## 2017-07-29 NOTE — Progress Notes (Signed)
@Patient  ID: [name], male    DOB: September 08, 1981, 36 y.o.   MRN: [id number]  Chief Complaint  Patient presents with  . 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 On BILEVEL at bedtime for Hypercarbia.  Also has Asthma .  D-CHFwith RV Failure /Pulmonary HTN  >Followed by Cardiology   TEST /Events  Pectus excavatum status post repair in [place] in childhood and repeat repair at [facility] in 6/11. Borderline Marfanoid habitus.   07/29/2017 Acute OV : SOB  Pt presents for an acute office visit . Complains over last 2 weeks of intermittent cough and drainage, sinus pressure . Marland Kitchen Over last couple of days developed pleuritic chest wall pain on right side. Sore to touch and worse with deep inspiration. Feeling more sob with activity . No dyspnea at rest.   No fever, discolored mucus , orthopnea, edema . No hemoptysis , calf pain. No syncope. Appetite is good w/ no n/v/d.  Was on Symbicort in past but has not taken in >1 year . Does not check oxygen at home in last 6 months .  Pt is on O2 At bedtime with BIPAP . Has been on oxygen with activity and exercise due to exertional hypoxemia  in past but has not been using it for last year.  Today 07/29/2017  on arrival O2 sats 80% walking on room air. O2 sats at rest on room air 89-92%.   He is leaving tomorrow to go to [facility] for family . Discussed issues with oxygen [place] .     No Known Allergies  Immunization History  Administered Date(s) Administered  . Influenza Split 06/16/2012  . Influenza Whole 06/09/2010, 06/10/2011  . Influenza,inj,Quad PF,6+ Mos 06/08/2013, 08/10/2015  . Influenza-Unspecified 06/09/2014    Past Medical History:  Diagnosis Date  . Asthma   . CHF (congestive heart failure) ([place])   . Chronic hypercapnic respiratory failure (HCC)    nocturnal  hypoxemic resp  failure, on bipap  . Pectus excavatum   . Restrictive lung disease     Tobacco History: Social History   Tobacco Use  Smoking Status Former Smoker  . Packs/day: 0.10  . Years: 1.00  . Pack years: 0.10  . Types: Cigarettes  . Last attempt to quit: 09/09/2006  . Years since quitting: 10.8  Smokeless Tobacco Never Used   Counseling given: Not Answered   Outpatient Encounter Medications as of 07/29/2017  Medication Sig  . albuterol (PROAIR HFA) 108 (90 BASE) MCG/ACT inhaler Inhale 2 puffs into the lungs every 6 (six) hours as needed for wheezing or shortness of breath.  . amphetamine-dextroamphetamine (ADDERALL) 10 MG tablet Take 10 mg by mouth 4 (four) times daily as needed.  . bisoprolol (ZEBETA) 5 MG tablet TAKE 1 TABLET (5 MG TOTAL) BY MOUTH DAILY.  . cetirizine (ZYRTEC) 10 MG tablet Take 1 tablet (10 mg total) by mouth daily.  . furosemide (LASIX) 40 MG tablet Take 1 tab in AM and 1/2 tab in PM  . isosorbide mononitrate (IMDUR) 30 MG 24 hr tablet TAKE 1 TABLET (30 MG TOTAL) BY MOUTH DAILY.  Marland Kitchen Omeprazole (PRILOSEC PO) Take by mouth.  . Oxymetazoline HCl (SUDAFED OM SINUS COLD NA) Take 1 tablet by mouth as needed. Reported on 10/05/2015  . potassium chloride SA (KLOR-CON M20) 20 MEQ tablet Take  2 tablets (40 mEq total) daily by mouth.  Marland Kitchen azithromycin (ZITHROMAX Z-PAK) 250 MG tablet Take 2 tablets (500 mg) on  Day 1,  followed by 1 tablet (250 mg) once daily on Days 2 through 5.  . Fluticasone Furoate-Vilanterol 100-25 MCG/INH AEPB Inhale 1 puff into the lungs daily. (Patient not taking: Reported on 07/29/2017)  . HYDROcodone-homatropine (HYCODAN) 5-1.5 MG/5ML syrup Take 5 mLs by mouth at bedtime as needed for cough. (Patient not taking: Reported on 07/29/2017)  . predniSONE (DELTASONE) 10 MG tablet 4 tabs for 2 days, then 3 tabs for 2 days, 2 tabs for 2 days, then 1 tab for 2 days, then stop  . [DISCONTINUED] doxycycline (VIBRA-TABS) 100 MG tablet Take 1 tablet (100 mg total) by mouth  2 (two) times daily. (Patient not taking: Reported on 07/29/2017)  . [DISCONTINUED] predniSONE (DELTASONE) 10 MG tablet Take 4 tabs by mouth for 3 days, then 3 for 3 days, 2 for 3 days, 1 for 3 days and stop (Patient not taking: Reported on 07/29/2017)   No facility-administered encounter medications on file as of 07/29/2017.      Review of Systems  Constitutional:   No  weight loss, night sweats,  Fevers, chills,  +fatigue, or  lassitude.  HEENT:   No headaches,  Difficulty swallowing,  Tooth/dental problems, or  Sore throat,                No sneezing, itching, ear ache, + nasal congestion, post nasal drip,   CV:  No chest pain,  Orthopnea, PND, swelling in lower extremities, anasarca, dizziness, palpitations, syncope.   GI  No heartburn, indigestion, abdominal pain, nausea, vomiting, diarrhea, change in bowel habits, loss of appetite, bloody stools.   Resp:  .  No chest wall deformity  Skin: no rash or lesions.  GU: no dysuria, change in color of urine, no urgency or frequency.  No flank pain, no hematuria   MS:  No joint pain or swelling.  No decreased range of motion.  No back pain.    Physical Exam  BP 124/76 (BP Location: Left Arm, Cuff Size: Normal)   Pulse 75   Ht 6\' 4"  (1.93 m)   Wt 215 lb 9.6 oz (97.8 kg)   SpO2 95%   BMI 26.24 kg/m   GEN: A/Ox3; pleasant , NAD, well nourished    HEENT:  [place]/AT,  EACs-clear, TMs-wnl, NOSE-clear, THROAT-clear, no lesions, no postnasal drip or exudate noted.   NECK:  Supple w/ fair ROM; no JVD; normal carotid impulses w/o bruits; no thyromegaly or nodules palpated; no lymphadenopathy.    RESP  Clear  P & A; w/o, wheezes/ rales/ or rhonchi. no accessory muscle use, no dullness to percussion Pectus deformity noted, tender along right lateral ribs.   CARD:  RRR, no m/r/g, no peripheral edema, pulses intact, no cyanosis or clubbing. Neg calf pain /tenderness,neg homans sign   GI:   Soft & nt; nml bowel sounds; no organomegaly  or masses detected.   Musco: Warm bil, no deformities or joint swelling noted.   Neuro: alert, no focal deficits noted.    Skin: Warm, no lesions or rashes    Lab Results:  CBC  BNP  Imaging: Dg Chest 2 View  Result Date: 07/29/2017 CLINICAL DATA:  Low oxygen saturation despite supplemental oxygen. Right lower chest discomfort for the past day. History of asthma, pulmonary hypertension, CHF, former smoker. EXAM: CHEST  2 VIEW COMPARISON:  Chest x-ray of October 14, 2016 FINDINGS:  The lungs remain hyperinflated. Pectus excavatum accentuates the cardiac silhouette size and obscures the heart borders. The pulmonary vascularity is not engorged. There is no pleural effusion. The bony thorax exhibits no acute abnormality. IMPRESSION: No acute cardiopulmonary abnormality. Pectus excavatum type chest contour distorts the appearance of the heart. This is stable. Electronically Signed   By: David  Martinique M.D.   On: 07/29/2017 16:08     Assessment & Plan:   Moderate persistent asthma in adult without complication Possible flare with URI/early sinusitis  Will tx w/ empiric abx /steroids .  Restart Synbicort .  CXR w/ no acute process   Plan  Patient Instructions  Zpack take as directed.  Prednisone taper over next week  Restart Symbicort 2 puffs Twice daily  , rinse after use.  Mucinex DM Twice daily  As needed cough/congestion .  Labs today .  Restart Oxygen 2l/m with activity . To keep sats >88-90%.  Continue on BIPAP At bedtime  .  Follow up with Dr. Lake Bells in 2 weeks and .As needed   Please contact office for sooner follow up if symptoms do not improve or worsen or seek emergency care      Restrictive lung disease W/ Hypercarbia -cont w/ BIPAP w/ O2 At bedtime  .   Chronic respiratory failure with hypoxia and hypercapnia (HCC) Pt with hx of exertional hypoxemia on Oxygen in past with act . Now with exertional hypoxia ? May be chronic issue with underlying restrictive lung  dz, pulmonary HTN , Diastolic CHF  Check labs today w/ bnp and D Dimer , if elevated with need further evaluation . Begin oxygen 2l/m with activity .       Rexene Edison, NP 07/29/2017

## 2017-07-29 NOTE — Assessment & Plan Note (Signed)
Possible flare with URI/early sinusitis  Will tx w/ empiric abx /steroids .  Restart Synbicort .  CXR w/ no acute process   Plan  Patient Instructions  Zpack take as directed.  Prednisone taper over next week  Restart Symbicort 2 puffs Twice daily  , rinse after use.  Mucinex DM Twice daily  As needed cough/congestion .  Labs today .  Restart Oxygen 2l/m with activity . To keep sats >88-90%.  Continue on BIPAP At bedtime  .  Follow up with Dr. Lake Bells in 2 weeks and .As needed   Please contact office for sooner follow up if symptoms do not improve or worsen or seek emergency care

## 2017-07-29 NOTE — Patient Instructions (Addendum)
Zpack take as directed.  Prednisone taper over next week  Restart Symbicort 2 puffs Twice daily  , rinse after use.  Mucinex DM Twice daily  As needed cough/congestion .  Labs today .  Restart Oxygen 2l/m with activity . To keep sats >88-90%.  Continue on BIPAP At bedtime  .  Follow up with Dr. Lake Bells in 2 weeks and .As needed   Please contact office for sooner follow up if symptoms do not improve or worsen or seek emergency care

## 2017-07-29 NOTE — Assessment & Plan Note (Signed)
W/ Hypercarbia -cont w/ BIPAP w/ O2 At bedtime  .

## 2017-07-29 NOTE — Assessment & Plan Note (Signed)
Pt with hx of exertional hypoxemia on Oxygen in past with act . Now with exertional hypoxia ? May be chronic issue with underlying restrictive lung dz, pulmonary HTN , Diastolic CHF  Check labs today w/ bnp and D Dimer , if elevated with need further evaluation . Begin oxygen 2l/m with activity .

## 2017-07-30 ENCOUNTER — Telehealth: Payer: Self-pay | Admitting: Adult Health

## 2017-07-30 LAB — D-DIMER, QUANTITATIVE (NOT AT ARMC)

## 2017-07-30 NOTE — Telephone Encounter (Signed)
Had called and spoken with patient approximately 1 and 1/2 hours ago to check on him and see if he went to get the SimplyGo that Corene Cornea w/ [facility] and I worked on until 5:45pm yesterday  Patient stated that he is feeling okay but does note some increased dry cough since his visit yesterday with Rexene Edison NP.  Last refill on the Hycodan was at the 2.5.18 visit with AD.  Advised patient will speak with TP and call him back with her response.  At the time of the call pt had yet to pick up the SimplyGo from [facility].  Per TP: CO2 was elevated so recommend no narcotics at this time.  Would recommend Delsym 2tsp twice daily as needed for cough and Mucinex DM twice daily as needed for cough/congestion.  Left detailed message on machine informing pt of TP's recs as stated above.  Advised pt to please call the office if needed for worsening symptoms.

## 2017-08-03 ENCOUNTER — Other Ambulatory Visit (HOSPITAL_COMMUNITY): Payer: Self-pay | Admitting: Cardiology

## 2017-08-03 DIAGNOSIS — I509 Heart failure, unspecified: Secondary | ICD-10-CM

## 2017-08-08 ENCOUNTER — Encounter (HOSPITAL_COMMUNITY): Payer: Self-pay | Admitting: Cardiology

## 2017-08-08 ENCOUNTER — Ambulatory Visit (HOSPITAL_COMMUNITY)
Admission: RE | Admit: 2017-08-08 | Discharge: 2017-08-08 | Disposition: A | Discharge status: Home / Self Care | Payer: 59 | Source: Ambulatory Visit | Attending: Cardiology | Admitting: Cardiology

## 2017-08-08 VITALS — BP 138/75 | HR 96 | Wt 215.2 lb

## 2017-08-08 DIAGNOSIS — Z87891 Personal history of nicotine dependence: Secondary | ICD-10-CM | POA: Insufficient documentation

## 2017-08-08 DIAGNOSIS — I272 Pulmonary hypertension, unspecified: Secondary | ICD-10-CM | POA: Diagnosis not present

## 2017-08-08 DIAGNOSIS — R1010 Upper abdominal pain, unspecified: Secondary | ICD-10-CM

## 2017-08-08 DIAGNOSIS — J984 Other disorders of lung: Secondary | ICD-10-CM | POA: Insufficient documentation

## 2017-08-08 DIAGNOSIS — I5032 Chronic diastolic (congestive) heart failure: Secondary | ICD-10-CM | POA: Diagnosis not present

## 2017-08-08 DIAGNOSIS — Z79899 Other long term (current) drug therapy: Secondary | ICD-10-CM | POA: Diagnosis not present

## 2017-08-08 DIAGNOSIS — R1011 Right upper quadrant pain: Secondary | ICD-10-CM | POA: Insufficient documentation

## 2017-08-08 DIAGNOSIS — J45909 Unspecified asthma, uncomplicated: Secondary | ICD-10-CM | POA: Insufficient documentation

## 2017-08-08 DIAGNOSIS — Z8249 Family history of ischemic heart disease and other diseases of the circulatory system: Secondary | ICD-10-CM | POA: Diagnosis not present

## 2017-08-08 LAB — LIPID PANEL
CHOL/HDL RATIO: 3.6 ratio
CHOLESTEROL: 151 mg/dL (ref 0–200)
HDL: 42 mg/dL (ref >40)
LDL CALC: 86 mg/dL (ref 0–99)
TRIGLYCERIDES: 116 mg/dL (ref <150)
VLDL: 23 mg/dL (ref 0–40)

## 2017-08-08 LAB — HEPATIC FUNCTION PANEL
ALBUMIN: 4.3 g/dL (ref 3.5–5.0)
ALK PHOS: 76 U/L (ref 38–126)
ALT: 31 U/L (ref 17–63)
AST: 22 U/L (ref 15–41)
Bilirubin, Direct: 0.1 mg/dL (ref 0.1–0.5)
Indirect Bilirubin: 0.3 mg/dL (ref 0.3–0.9)
TOTAL PROTEIN: 7.3 g/dL (ref 6.5–8.1)
Total Bilirubin: 0.4 mg/dL (ref 0.3–1.2)

## 2017-08-08 NOTE — Patient Instructions (Signed)
Labs today (will call for abnormal results, otherwise no news is good news)  RUQ [place] has been ordered for you for this afternoon.  Cardiac MRI has been ordered for you, we will call you to schedule appointment  Follow up in 1 year, please call us to schedule your appointment.  [phone], Option 3

## 2017-08-11 NOTE — Progress Notes (Signed)
Patient ID: [name], male   DOB: 03-31-1981, 36 y.o.   MRN: [id number] PCP: Dr. Tollie Pizza Cardiology: Dr. Aundra Dubin  36 y.o. with history of pectus excavatum s/p repair in childhood and redo pectus repair in 6/11 returns to cardiology clinic for followup of RV failure. He has chronic hypercarbic and and nocturnal hypoxemic respiratory failure.  He has been wearing Bipap at night.  Oxygen saturation drops with heavy exercise, so he uses oxygen when he will be exercising.  Last echo was in 5/16 and showed normal LV EF but did not show the RV well.  I had him get a repeat cardiac MRI for better view of the RV.  This showed moderate to severe RV dilation with mild to moderate RV systolic dysfunction, RV EF 37%.  This looked very similar to the 2010 MRI.   He is not feeling well today.  He is being treated for an asthma flare by pulmonary, currently on prednisone taper.  He also has upper abdominal pain on the right. Prednisone has helped his breathing but still using oxygen more than baseline.  He is short of breath when he climbs about 1.5 flights of stairs. He is feeling bloated.  No orthopnea/PND. Uses Bipap at night still. Recent CXR was clear.   Labs (8/10): BNP 9, K 3.6, creatinine 0.7  Labs (2/14): K 3.8, creatinine 0.8, BNP not elevated Labs (2/16): K 3.4, creatinine 0.81 Labs (6/16): K 4, creatinine 0.73, BNP 12 Labs (11/18): K 4.1, creatinine 0.82, pro-BNP 6, hgb 15.2   Allergies (verified):  No Known Drug Allergies   Past Medical History:  1. Pectus excavatum status post repair in [place] in childhood and repeat repair at [facility] in 6/11. Borderline Marfanoid habitus.  2. Asthma.  3. CHF: Patient was admitted in 6/10 with CHF and volume overload. He was diuresed with IV lasix and felt better. Echo suggested that the RV was enlarged. RHC was done (after several doses of IV lasix) showing mean RA 13, RV 38/12, PA 42/23 mean 34, mean PCWP 15. PVR < 2 WU. Shunt run showed no O2 step up.  Patient was hypoxic and was started O2. Cardiac MRI (6/10) showed normal LV size and systolic function EF 09%, D-shaped IV septum, moderate to severe RV dilation with RV EF 37%. The pulmonic valve opened normally. The heart was shifted leftward (pectus) with compression of the LA between the ascending and descending aorta. MRA of the chest showed normal pulmonary veins. CHF was primarily right-sided with mild pulmonary HTN noted. He is hypoxic, especially at night, so hypoxic pulmonary vasoconstriction could be playing a role. PCWP was only mildly elevated but I wonder if LA compression could be playing a role. Repeat echo after pectus surgery in 11/11 showed normal LV systolic function and size with a D-shaped interventricular septum. The LA was still compressed between ascending and descending aorta. The RV was moderately dilated with mild systolic dysfunction.  Echo (5/16) with EF 5-60%, aortic root 4.0 cm, RV poorly vsiualized.  Cardiac MRI (6/16) with pectus excavatum, left-shifted heart, LA compressed between ascending and descending aorta, LV EF 58% with D-shaped interventricular septum suggestive of RV pressure/volume overload, moderate to severe RV dilation with mild to moderate RV systolic dysfunction (RV EF 37%), dilated PA (this MRI is very similar to prior in 2010).  4. 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  5. 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 Patient is on Bipap.   Family  History:  POS for HTN-Mother   Social History:  The patient lives in [place] with his wife and son.  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  He drinks 1-6 drinks per week, never having more than 3 drinks in 1 evening. No illicit drug use.  Patient states former smoker. started at age 61. only smoked socially. quit smoking at age 71.   Review of Systems  All systems reviewed and negative except as per HPI.  Current Outpatient  Medications  Medication Sig Dispense Refill  . albuterol (PROAIR HFA) 108 (90 BASE) MCG/ACT inhaler Inhale 2 puffs into the lungs every 6 (six) hours as needed for wheezing or shortness of breath. 8.5 each 0  . amphetamine-dextroamphetamine (ADDERALL) 10 MG tablet Take 10 mg by mouth 4 (four) times daily as needed.    . bisoprolol (ZEBETA) 5 MG tablet TAKE 1 TABLET (5 MG TOTAL) BY MOUTH DAILY. 90 tablet 2  . cetirizine (ZYRTEC) 10 MG tablet Take 1 tablet (10 mg total) by mouth daily. 30 tablet 2  . Fluticasone Furoate-Vilanterol 100-25 MCG/INH AEPB Inhale 1 puff into the lungs daily. 1 each 5  . furosemide (LASIX) 40 MG tablet Take 1 tab in AM and 1/2 tab in PM 90 tablet 3  . furosemide (LASIX) 40 MG tablet TAKE 1 TAB IN AM AND 1/2 TAB IN PM 45 tablet 0  . isosorbide mononitrate (IMDUR) 30 MG 24 hr tablet TAKE 1 TABLET (30 MG TOTAL) BY MOUTH DAILY. 30 tablet 0  . Omeprazole (PRILOSEC PO) Take by mouth.    . Oxymetazoline HCl (SUDAFED OM SINUS COLD NA) Take 1 tablet by mouth as needed. Reported on 10/05/2015    . potassium chloride SA (KLOR-CON M20) 20 MEQ tablet Take 2 tablets (40 mEq total) daily by mouth. 60 tablet 0  . HYDROcodone-homatropine (HYCODAN) 5-1.5 MG/5ML syrup Take 5 mLs by mouth at bedtime as needed for cough. (Patient not taking: Reported on 08/08/2017) 100 mL 0  . predniSONE (DELTASONE) 10 MG tablet 4 tabs for 2 days, then 3 tabs for 2 days, 2 tabs for 2 days, then 1 tab for 2 days, then stop 20 tablet 0   No current facility-administered medications for this encounter.     BP 138/75 (BP Location: Left Arm, Patient Position: Sitting, Cuff Size: Normal)   Pulse 96   Wt 215 lb 3.2 oz (97.6 kg)   BMI 26.19 kg/m  General: NAD Neck: No JVD, no thyromegaly or thyroid nodule.  Lungs: Clear to auscultation bilaterally with normal respiratory effort. CV: Nondisplaced PMI.  Heart regular S1/S2, no S3/S4, no murmur.  No peripheral edema.  No carotid bruit.  Normal pedal pulses.   Abdomen: Soft, +RUQ tenderness, no hepatosplenomegaly, mild distention.  Skin: Intact without lesions or rashes.  Neurologic: Alert and oriented x 3.  Psych: Normal affect. Extremities: No clubbing or cyanosis.  HEENT: Normal.   Assessment/Plan: 1. Chronic diastolic CHF with RV failure:  Patient presented with primarily right-sided CHF in 2010, likely due to pulmonary hypertension from a combination of left atrial compression and nocturnal hypoxemia. He is now using Bipap every night. He had a redo pectus surgery in 2011. I suspect the surgery relieved some of the compression on his lungs but followup cardiac MRI in 6/16 did show continued compression of the left atrium between the ascending and descending aorta as well as a dilated RV with RV EF 37%.  Cardiac MRI in 6/16 was very similar to the MRI in 2010.  Increased dyspnea recently but pro-BNP 6 and he does not appear volume overloaded on exam.  Suspect he is currently having an asthma flare.   - Continue Bipap at night.  - Continue current Lasix 40 qam/20 qpm.    - If worsening dyspnea persists, will consider RHC.   - I am going to repeat cardiac MRI to evaluate RV (echo has had poor windows in this patient).  2. 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  RV was abnormal by MRI but stable compared to prior MRI in 2010.  3. Restrictive lung disease:  Lung disease from restriction due to pectus. He has had chronic hypercarbic respiratory failure.  This is a major player in his chronic dyspnea. He has had 2 operations already for his pectus deformity. 4. Asthma: Suspected flare, slow improvement with prednisone.  5. RUQ pain: Associated with abdominal bloating. RUQ [place] was done this afternoon and negative for cholecystitis. Will also check LFTs and  lipase.   Followup in 6 months   Loralie Champagne 08/11/2017

## 2017-08-12 ENCOUNTER — Encounter (HOSPITAL_COMMUNITY): Payer: Self-pay

## 2017-08-13 ENCOUNTER — Ambulatory Visit: Payer: 59 | Admitting: Pulmonary Disease

## 2017-08-13 NOTE — Progress Notes (Deleted)
Subjective:    Patient ID: [name], male    DOB: 06/21/1981, 36 y.o.   MRN: [id number]  Synopsis: This is a 36 year old gentleman with a past medical history significant for pectus excavatum which has been repaired twice. The first was in childhood at age 36 the second was in 2011 as a redo procedure at [facility]. The redo surgery was performed because he thinks that this was because the chest wall was pressing on his heart. He has been followed by the pulmonary clinic for asthma as well as hypercarbic respiratory failure secondary to his chest wall issues. He also has a history significant for pulmonary hypertension which has been felt to be due to his chronic hypercapnic respiratory failure and hypoxemic respiratory failure as well as some degree of left atrial compression from his chest wall issues. Right heart catheterization performed in 2011 showed no elevation of pulmonary vascular resistance.  He started on BIPAP around 02/2009.    HPI  No chief complaint on file.  ***Seen in November for a flare of asthma treated with prednisone  Past Medical History:  Diagnosis Date  . Asthma   . CHF (congestive heart failure) ([place])   . Chronic hypercapnic respiratory failure (HCC)    nocturnal  hypoxemic resp failure, on bipap  . Pectus excavatum   . Restrictive lung disease      Review of Systems     Objective:   Physical Exam  There were no vitals filed for this visit. room air   ***    Tue Oct 30, 2010 12:20 PM EST Patient: [name], [name] [id number] ______________________________________________________________________________  Pulmonary Labs: Final 10/30/2010 12:20 [place]  PULMONARY FUNCTION TEST    PRE POST  Ref Best %Pred Best %Pred %Chg FVC Liters 6.29 1.79 28 FEV1 Liters 5.07 0.90 18 FEV1/FVC % 81 50 FEV1/SVC % 50 FEF25-75% L/sec 4.93 0.34 7 PEF L/sec 11.10 3.06 28 MVV L/min 198 37 19 Vtg Liters 2.47 VC Liters 6.29 1.79 28 TLC Liters 8.11 2.68  33 RV Liters 1.95 0.89 46 FRC PL Liters 4.09 1.78 44 ERV Liters 2.08 0.64 31 IC Liters 4.15 0.90 22 DLCO mL/mmHg/min 33.6 22.7 68 VA Liters 2.51 IVC Liters 1.54 BHT Sec 11.95  Records reviewed from his visit with our nurse practitioner in November where he had a asthma exacerbation treated with prednisone     Assessment & Plan:   No diagnosis found.  Discussion: ***    Current Outpatient Medications:  .  albuterol (PROAIR HFA) 108 (90 BASE) MCG/ACT inhaler, Inhale 2 puffs into the lungs every 6 (six) hours as needed for wheezing or shortness of breath., Disp: 8.5 each, Rfl: 0 .  amphetamine-dextroamphetamine (ADDERALL) 10 MG tablet, Take 10 mg by mouth 4 (four) times daily as needed., Disp: , Rfl:  .  bisoprolol (ZEBETA) 5 MG tablet, TAKE 1 TABLET (5 MG TOTAL) BY MOUTH DAILY., Disp: 90 tablet, Rfl: 2 .  cetirizine (ZYRTEC) 10 MG tablet, Take 1 tablet (10 mg total) by mouth daily., Disp: 30 tablet, Rfl: 2 .  Fluticasone Furoate-Vilanterol 100-25 MCG/INH AEPB, Inhale 1 puff into the lungs daily., Disp: 1 each, Rfl: 5 .  furosemide (LASIX) 40 MG tablet, Take 1 tab in AM and 1/2 tab in PM, Disp: 90 tablet, Rfl: 3 .  furosemide (LASIX) 40 MG tablet, TAKE 1 TAB IN AM AND 1/2 TAB IN PM, Disp: 45 tablet, Rfl: 0 .  HYDROcodone-homatropine (HYCODAN) 5-1.5 MG/5ML syrup, Take 5 mLs by mouth at  bedtime as needed for cough. (Patient not taking: Reported on 08/08/2017), Disp: 100 mL, Rfl: 0 .  isosorbide mononitrate (IMDUR) 30 MG 24 hr tablet, TAKE 1 TABLET (30 MG TOTAL) BY MOUTH DAILY., Disp: 30 tablet, Rfl: 0 .  Omeprazole (PRILOSEC PO), Take by mouth., Disp: , Rfl:  .  Oxymetazoline HCl (SUDAFED OM SINUS COLD NA), Take 1 tablet by mouth as needed. Reported on 10/05/2015, Disp: , Rfl:  .  potassium chloride SA (KLOR-CON M20) 20 MEQ tablet, Take 2 tablets (40 mEq total) daily by mouth., Disp: 60 tablet, Rfl: 0 .  predniSONE (DELTASONE) 10 MG tablet, 4 tabs for 2 days, then 3 tabs for 2 days, 2  tabs for 2 days, then 1 tab for 2 days, then stop, Disp: 20 tablet, Rfl: 0

## 2017-08-14 ENCOUNTER — Other Ambulatory Visit: Payer: Self-pay | Admitting: Physician Assistant

## 2017-08-14 NOTE — Telephone Encounter (Signed)
Please review for refill, Thanks !  

## 2017-08-15 ENCOUNTER — Ambulatory Visit: Payer: 59 | Admitting: Pulmonary Disease

## 2017-08-15 ENCOUNTER — Encounter: Payer: Self-pay | Admitting: Pulmonary Disease

## 2017-08-15 VITALS — BP 110/70 | HR 84 | Ht 76.0 in | Wt 215.0 lb

## 2017-08-15 DIAGNOSIS — I5032 Chronic diastolic (congestive) heart failure: Secondary | ICD-10-CM

## 2017-08-15 DIAGNOSIS — R0789 Other chest pain: Secondary | ICD-10-CM | POA: Diagnosis not present

## 2017-08-15 DIAGNOSIS — J9611 Chronic respiratory failure with hypoxia: Secondary | ICD-10-CM

## 2017-08-15 DIAGNOSIS — J984 Other disorders of lung: Secondary | ICD-10-CM

## 2017-08-15 DIAGNOSIS — J9612 Chronic respiratory failure with hypercapnia: Secondary | ICD-10-CM | POA: Diagnosis not present

## 2017-08-15 NOTE — Patient Instructions (Signed)
Increasing shortness of breath with pleuritic chest pain. I think this may be related to your ribs stretching because of your prior surgery and the recent weight gain, it may also be due to a viral process which cause pleurisy.  However, I like to evaluate further with the following: Pulmonary function test CT scan of your chest  Pulmonary hypertension: I will talk to Dr. Algernon Huxley about holding off on the MRI of your heart for now until these tests have been done  Chronic respiratory failure with hypercapnia: Keep using BiPAP at night  Work on losing weight, try to lose about 10 pounds  We will see you back in about 3-4 weeks or sooner if needed

## 2017-08-15 NOTE — Progress Notes (Signed)
Subjective:    Patient ID: [name], male    DOB: Feb 25, 1981, 36 y.o.   MRN: [id number]  Synopsis: This is a 36 year old gentleman with a past medical history significant for pectus excavatum which has been repaired twice. The first was in childhood at age 30 the second was in 2011 as a redo procedure at [facility]. The redo surgery was performed because he thinks that this was because the chest wall was pressing on his heart. He has been followed by the pulmonary clinic for asthma as well as hypercarbic respiratory failure secondary to his chest wall issues. He also has a history significant for pulmonary hypertension which has been felt to be due to his chronic hypercapnic respiratory failure and hypoxemic respiratory failure as well as some degree of left atrial compression from his chest wall issues. Right heart catheterization performed in 2011 showed no elevation of pulmonary vascular resistance.  He started on BIPAP around 02/2009.    HPI  Chief Complaint  Patient presents with  . Follow-up    Feeling a bit better-still SOB. Still having right sided thoracic pain   [name] has been having more pain in his for chest for a month now.  He doesn't recall what caused it.   > sharp pain > no injury, fall > no fever, no sick contact > hurts worse with deep breath > he thinks that it has slightly improved, but it is still there > it is worse with a deep breath  Tammy Gave him Breo, he really couldn't tell if it mattered or not.    He thinks that that his breathing is better over the last year, but the last month has been pretty severe.    BIPAP > using the nasal mask nightly > no mornings headaches   Past Medical History:  Diagnosis Date  . Asthma   . CHF (congestive heart failure) ([place])   . Chronic hypercapnic respiratory failure (HCC)    nocturnal  hypoxemic resp failure, on bipap  . Pectus excavatum   . Restrictive lung disease      Review of Systems       Objective:   Physical Exam  Vitals:   08/15/17 1533  BP: 110/70  Pulse: 84  SpO2: 91%  Weight: 215 lb (97.5 kg)  Height: 6\' 4"  (1.93 m)   room air   Gen: well appearing HENT: OP clear, TM's clear, neck supple PULM: abnormal chest shape from surgery, lungs clear, normal percussion CV: RRR, no mgr, trace edema GI: BS+, soft, nontender Derm: no cyanosis or rash Psyche: normal mood and affect     Tue Oct 30, 2010 12:20 PM EST Patient: Brian, [name] [id number] ______________________________________________________________________________  Pulmonary Labs: Final 10/30/2010 12:20 [place]  PULMONARY FUNCTION TEST    PRE POST  Ref Best %Pred Best %Pred %Chg FVC Liters 6.29 1.79 28 FEV1 Liters 5.07 0.90 18 FEV1/FVC % 81 50 FEV1/SVC % 50 FEF25-75% L/sec 4.93 0.34 7 PEF L/sec 11.10 3.06 28 MVV L/min 198 37 19 Vtg Liters 2.47 VC Liters 6.29 1.79 28 TLC Liters 8.11 2.68 33 RV Liters 1.95 0.89 46 FRC PL Liters 4.09 1.78 44 ERV Liters 2.08 0.64 31 IC Liters 4.15 0.90 22 DLCO mL/mmHg/min 33.6 22.7 68 VA Liters 2.51 IVC Liters 1.54 BHT Sec 11.95  Records reviewed from his visit with our nurse practitioner in November where he had a asthma exacerbation treated with prednisone     Assessment & Plan:   Restrictive lung  disease  Other chest pain - Plan: CT Chest High Resolution, Pulmonary Function Test  Chronic respiratory failure with hypoxia and hypercapnia (HCC)  Chronic diastolic congestive heart failure ([place])  Discussion: Othon presents with 1 month of right-sided pleuritic chest pain and some mild increase in shortness of breath.  This is in the background of him having a pretty good year with improving breathing over the last year.  He has been compliant with BiPAP.  Breo really did not help much.  I think the differential diagnosis here either includes a viral pleurisy, a musculoskeletal pain from recent weight gain and his abnormal chest wall shape with ribs  protruding more from gaining abdominal fat.  However, I suppose that there could be a more ominous problem that the recent chest x-ray showed nothing too worrisome.  I would like to get a CT scan to evaluate his chest further given his extensive history of chest surgery.  Plan: Increasing shortness of breath with pleuritic chest pain. I think this may be related to your ribs stretching because of your prior surgery and the recent weight gain, it may also be due to a viral process which cause pleurisy.  However, I like to evaluate further with the following: Pulmonary function test CT scan of your chest  Pulmonary hypertension: I will talk to Dr. Algernon Huxley about holding off on the MRI of your heart for now until these tests have been done  Chronic respiratory failure with hypercapnia: Keep using BiPAP at night  Work on losing weight, try to lose about 10 pounds  We will see you back in about 3-4 weeks or sooner if needed   Current Outpatient Medications:  .  albuterol (PROAIR HFA) 108 (90 BASE) MCG/ACT inhaler, Inhale 2 puffs into the lungs every 6 (six) hours as needed for wheezing or shortness of breath., Disp: 8.5 each, Rfl: 0 .  amphetamine-dextroamphetamine (ADDERALL) 10 MG tablet, Take 10 mg by mouth 4 (four) times daily as needed., Disp: , Rfl:  .  bisoprolol (ZEBETA) 5 MG tablet, TAKE 1 TABLET (5 MG TOTAL) BY MOUTH DAILY., Disp: 90 tablet, Rfl: 2 .  cetirizine (ZYRTEC) 10 MG tablet, Take 1 tablet (10 mg total) by mouth daily., Disp: 30 tablet, Rfl: 2 .  Fluticasone Furoate-Vilanterol 100-25 MCG/INH AEPB, Inhale 1 puff into the lungs daily., Disp: 1 each, Rfl: 5 .  furosemide (LASIX) 40 MG tablet, TAKE 1 TAB IN AM AND 1/2 TAB IN PM, Disp: 45 tablet, Rfl: 0 .  HYDROcodone-homatropine (HYCODAN) 5-1.5 MG/5ML syrup, Take 5 mLs by mouth at bedtime as needed for cough., Disp: 100 mL, Rfl: 0 .  isosorbide mononitrate (IMDUR) 30 MG 24 hr tablet, TAKE 1 TABLET (30 MG TOTAL) BY MOUTH DAILY.,  Disp: 30 tablet, Rfl: 0 .  Omeprazole (PRILOSEC PO), Take by mouth., Disp: , Rfl:  .  potassium chloride SA (KLOR-CON M20) 20 MEQ tablet, Take 2 tablets (40 mEq total) daily by mouth., Disp: 60 tablet, Rfl: 0

## 2017-08-18 ENCOUNTER — Other Ambulatory Visit: Payer: Self-pay | Admitting: Physician Assistant

## 2017-08-20 ENCOUNTER — Ambulatory Visit (HOSPITAL_BASED_OUTPATIENT_CLINIC_OR_DEPARTMENT_OTHER)
Admission: RE | Admit: 2017-08-20 | Discharge: 2017-08-20 | Disposition: A | Discharge status: Home / Self Care | Payer: 59 | Source: Ambulatory Visit | Attending: Pulmonary Disease | Admitting: Pulmonary Disease

## 2017-08-20 DIAGNOSIS — R0789 Other chest pain: Secondary | ICD-10-CM | POA: Diagnosis not present

## 2017-08-20 DIAGNOSIS — R918 Other nonspecific abnormal finding of lung field: Secondary | ICD-10-CM | POA: Diagnosis not present

## 2017-08-20 DIAGNOSIS — I281 Aneurysm of pulmonary artery: Secondary | ICD-10-CM | POA: Diagnosis not present

## 2017-08-20 DIAGNOSIS — I517 Cardiomegaly: Secondary | ICD-10-CM | POA: Diagnosis not present

## 2017-08-20 DIAGNOSIS — J984 Other disorders of lung: Secondary | ICD-10-CM | POA: Diagnosis not present

## 2017-08-21 ENCOUNTER — Ambulatory Visit (INDEPENDENT_AMBULATORY_CARE_PROVIDER_SITE_OTHER): Payer: 59 | Admitting: Pulmonary Disease

## 2017-08-21 DIAGNOSIS — R0789 Other chest pain: Secondary | ICD-10-CM

## 2017-08-21 LAB — PULMONARY FUNCTION TEST
DL/VA % pred: 163 %
DL/VA: 8.06 ml/min/mmHg/L
DLCO COR % PRED: 55 %
DLCO UNC % PRED: 54 %
DLCO cor: 21.75 ml/min/mmHg
DLCO unc: 21.18 ml/min/mmHg
FEF 25-75 PRE: 0.49 L/s
FEF 25-75 Post: 0.5 L/sec
FEF2575-%CHANGE-POST: 2 %
FEF2575-%PRED-PRE: 10 %
FEF2575-%Pred-Post: 10 %
FEV1-%CHANGE-POST: 1 %
FEV1-%Pred-Post: 15 %
FEV1-%Pred-Pre: 14 %
FEV1-POST: 0.74 L
FEV1-Pre: 0.73 L
FEV1FVC-%Change-Post: 0 %
FEV1FVC-%Pred-Pre: 89 %
FEV6-%CHANGE-POST: 2 %
FEV6-%PRED-POST: 17 %
FEV6-%PRED-PRE: 16 %
FEV6-PRE: 1.02 L
FEV6-Post: 1.05 L
FEV6FVC-%PRED-PRE: 102 %
FEV6FVC-%Pred-Post: 102 %
FVC-%CHANGE-POST: 2 %
FVC-%PRED-POST: 16 %
FVC-%Pred-Pre: 16 %
FVC-Post: 1.05 L
FVC-Pre: 1.02 L
POST FEV1/FVC RATIO: 71 %
POST FEV6/FVC RATIO: 100 %
PRE FEV6/FVC RATIO: 100 %
Pre FEV1/FVC ratio: 72 %
RV % PRED: 83 %
RV: 1.72 L
TLC % PRED: 35 %
TLC: 2.81 L

## 2017-08-21 NOTE — Progress Notes (Signed)
PFT completed today 08/21/17

## 2017-08-22 ENCOUNTER — Telehealth: Payer: Self-pay | Admitting: Pulmonary Disease

## 2017-08-22 DIAGNOSIS — R918 Other nonspecific abnormal finding of lung field: Secondary | ICD-10-CM

## 2017-08-22 NOTE — Telephone Encounter (Signed)
Placed order for High Resolution CT Scan for 6 months to further review the new lung nodule per BQ

## 2017-08-26 ENCOUNTER — Other Ambulatory Visit (HOSPITAL_COMMUNITY): Payer: Self-pay | Admitting: Cardiology

## 2017-08-26 DIAGNOSIS — I509 Heart failure, unspecified: Secondary | ICD-10-CM

## 2017-10-07 ENCOUNTER — Ambulatory Visit: Payer: 59 | Admitting: Adult Health

## 2017-10-07 ENCOUNTER — Encounter: Payer: Self-pay | Admitting: Adult Health

## 2017-10-07 VITALS — BP 122/76 | HR 84 | Ht 76.0 in | Wt 216.8 lb

## 2017-10-07 DIAGNOSIS — J984 Other disorders of lung: Secondary | ICD-10-CM

## 2017-10-07 DIAGNOSIS — J9612 Chronic respiratory failure with hypercapnia: Secondary | ICD-10-CM | POA: Diagnosis not present

## 2017-10-07 DIAGNOSIS — J9611 Chronic respiratory failure with hypoxia: Secondary | ICD-10-CM | POA: Diagnosis not present

## 2017-10-07 DIAGNOSIS — J329 Chronic sinusitis, unspecified: Secondary | ICD-10-CM | POA: Diagnosis not present

## 2017-10-07 DIAGNOSIS — J4540 Moderate persistent asthma, uncomplicated: Secondary | ICD-10-CM | POA: Diagnosis not present

## 2017-10-07 NOTE — Patient Instructions (Addendum)
Begin  Oxygen 2l/m with activity . To keep sats >88-90%.  Finish Augmentin as directed.  Continue on BIPAP At bedtime  .  Continue on BREO daily .  Follow up with Dr. Lake Bells in 1 month and As needed   Please contact office for sooner follow up if symptoms do not improve or worsen or seek emergency care

## 2017-10-07 NOTE — Progress Notes (Signed)
37 y/o with a past medical history significant for pectus excavatum which has been repaired twice. Also, asthma, CHF, restrictive lungs disease, and chronic hypercapnic respiratory failure.    Presents today for routine follow up of his pectus excavatum.  Chest tightness has improved since the last visit with Dr. Lake Bells 08/15/17.  States has been doing much better overall, until last last Friday when he came down with a sinus infection.  Continues to wear BIPAP at 4L 02 from 10pm to 8 am every night.  Has been requiring an additional 2 hours as needed, but the last 2 days has been on 02 2L [place] for at least 5 hour each day.  Per pt "Trying to do anything I can not to be on oxygen all the time".  Has been taking Breo Ellipta as prescribed and has been taking Albuterol inhaler twice daily since last Friday.  Denies fever, chills, wheezing, edema, and hemoptysis.  Endorses frontal sinus pressure, and exertional SOB.     OV visit with primary care DM and was diagnosed with a sinus infection and was treated with augmentin.  Pt still have 3 days left on antibiotics.  Weight stable, has not lost any weight since last visit with Dr. Lake Bells 08/15/17.  Physical Assessment Gen: SOB at rest HENT: OP clear, TM's clear, neck supple PULM: abnormal chest shape from surgery, lungs clear except slight fine crackles to anterior right middle lobe. CV: RRR, no mgr, no edema GI: BS+, soft, nontender Derm: no cyanosis or rash Psyche: normal mood and affect

## 2017-10-07 NOTE — Progress Notes (Signed)
@Patient  ID: [name], male    DOB: [birth year], 37 y.o.   MRN: [id number]  Chief Complaint  Patient presents with  . Follow-up    Referring provider: [name], PA-C  HPI: 37 yo male followed for severe restrictive lung disease secondary to pectus deformity (s/p repair x 2- age 42 and in 2011 at [facility])  . On BILEVEL at bedtime for Hypercarbia.  Also has Asthma .  D-CHFwith RV Failure /Pulmonary HTN  >Followed by Cardiology   TEST /Events  Pectus excavatum status post repair in [place] in childhood and repeat repair at [facility] in 6/11. Borderline Marfanoid habitus.   10/07/2017 Follow up : Severe Restrictive lung disease, Chronic hypoxic and hypercarbic Respiratory Failure on BILEVEL support At bedtime     10/07/2017 Follow up : Restrictive Lung Disease , o2 RF  Pt returns for follow up . Says he has recently been dx with sinus infection from PCP and started on Augmentin . Sx are improving with decreased nasal congestion and cough .   Pt has known hypercarbic and hypoxic respiratory failure on BIPAP and O2 at bedtime . He has exertional hypoxemia is suppose to be on oxygen with activity but does not wear it. He does not like to wear oxygen in public . Long discussion regarding oxygen and potential complications of hypoxia . He aggress to trying to wear oxygen with activity . He desats in 80s on room air walking . On oxygen sats >90% on 3l/m . He is still trying to work will need POC device . He has young kids needs to be able to be more active .    Used to go to pulmonary rehab in [place].  Feels he is has gained weight in stomach area and is out of shape because not exercising .  No Known Allergies  Immunization History  Administered Date(s) Administered  . Influenza Split 06/16/2012  . Influenza Whole 06/09/2010, 06/10/2011  . Influenza,inj,Quad PF,6+ Mos 06/08/2013, 08/10/2015  . Influenza-Unspecified 06/09/2014    Past Medical History:    Diagnosis Date  . Asthma   . CHF (congestive heart failure) ([place])   . Chronic hypercapnic respiratory failure (HCC)    nocturnal  hypoxemic resp failure, on bipap  . Pectus excavatum   . Restrictive lung disease     Tobacco History: Social History   Tobacco Use  Smoking Status Former Smoker  . Packs/day: 0.10  . Years: 1.00  . Pack years: 0.10  . Types: Cigarettes  . Last attempt to quit: 09/09/2006  . Years since quitting: 11.0  Smokeless Tobacco Never Used   Counseling given: Not Answered   Outpatient Encounter Medications as of 10/07/2017  Medication Sig  . albuterol (PROAIR HFA) 108 (90 BASE) MCG/ACT inhaler Inhale 2 puffs into the lungs every 6 (six) hours as needed for wheezing or shortness of breath.  . amphetamine-dextroamphetamine (ADDERALL) 10 MG tablet Take 10 mg by mouth 4 (four) times daily as needed.  . bisoprolol (ZEBETA) 5 MG tablet TAKE 1 TABLET (5 MG TOTAL) BY MOUTH DAILY.  . cetirizine (ZYRTEC) 10 MG tablet Take 1 tablet (10 mg total) by mouth daily.  . Fluticasone Furoate-Vilanterol 100-25 MCG/INH AEPB Inhale 1 puff into the lungs daily.  . furosemide (LASIX) 40 MG tablet TAKE 1 TAB IN AM AND 1/2 TAB IN PM  . isosorbide mononitrate (IMDUR) 30 MG 24 hr tablet TAKE 1 TABLET BY MOUTH EVERY DAY  . KLOR-CON M20 20 MEQ tablet TAKE 2  TABLETS (40 MEQ TOTAL) DAILY BY MOUTH.  . Omeprazole (PRILOSEC PO) Take by mouth.  . [DISCONTINUED] HYDROcodone-homatropine (HYCODAN) 5-1.5 MG/5ML syrup Take 5 mLs by mouth at bedtime as needed for cough.   No facility-administered encounter medications on file as of 10/07/2017.      Review of Systems  Constitutional:   No  weight loss, night sweats,  Fevers, chills,  +fatigue, or  lassitude.  HEENT:   No headaches,  Difficulty swallowing,  Tooth/dental problems, or  Sore throat,                No sneezing, itching, ear ache,  +nasal congestion, post nasal drip,   CV:  No chest pain,  Orthopnea, PND, swelling in lower  extremities, anasarca, dizziness, palpitations, syncope.   GI  No heartburn, indigestion, abdominal pain, nausea, vomiting, diarrhea, change in bowel habits, loss of appetite, bloody stools.   Resp: N No chest wall deformity  Skin: no rash or lesions.  GU: no dysuria, change in color of urine, no urgency or frequency.  No flank pain, no hematuria   MS:  No joint pain or swelling.  No decreased range of motion.  No back pain.    Physical Exam  BP 122/76 (BP Location: Left Arm, Cuff Size: Normal)   Ht 6\' 4"  (1.93 m)   Wt 216 lb 12.8 oz (98.3 kg)   BMI 26.39 kg/m   GEN: A/Ox3; pleasant , NAD, thin and tall    HEENT:  [place]/AT,  EACs-clear, TMs-wnl, NOSE-clear, THROAT-clear, no lesions, no postnasal drip or exudate noted.   NECK:  Supple w/ fair ROM; no JVD; normal carotid impulses w/o bruits; no thyromegaly or nodules palpated; no lymphadenopathy.    RESP  Clear  P & A; w/o, wheezes/ rales/ or rhonchi. no accessory muscle use, no dullness to percussion Pectus deformity noted  CARD:  RRR, no m/r/g, no peripheral edema, pulses intact, no cyanosis or clubbing.  GI:   Soft & nt; nml bowel sounds; no organomegaly or masses detected.   Musco: Warm bil, no deformities or joint swelling noted.   Neuro: alert, no focal deficits noted.    Skin: Warm, no lesions or rashes    Lab Results:  CBC    Component Value Date/Time   WBC 7.6 07/29/2017 1709   RBC 4.98 07/29/2017 1709   HGB 15.2 07/29/2017 1709   HCT 46.0 07/29/2017 1709   PLT 189.0 07/29/2017 1709   MCV 92.3 07/29/2017 1709   MCH 29.5 06/13/2010 0620   MCHC 33.2 07/29/2017 1709   RDW 13.7 07/29/2017 1709   LYMPHSABS 1.3 07/29/2017 1709   MONOABS 0.6 07/29/2017 1709   EOSABS 0.1 07/29/2017 1709   BASOSABS 0.0 07/29/2017 1709    BMET    Component Value Date/Time   NA 141 07/29/2017 1709   K 4.1 07/29/2017 1709   CL 93 (L) 07/29/2017 1709   CO2 45 (H) 07/29/2017 1709   GLUCOSE 94 07/29/2017 1709   BUN 9  07/29/2017 1709   CREATININE 0.82 07/29/2017 1709   CALCIUM 9.3 07/29/2017 1709   GFRNONAA >60 10/27/2015 1230   GFRAA >60 10/27/2015 1230    BNP    Component Value Date/Time   BNP 17.3 10/27/2015 1230    ProBNP    Component Value Date/Time   PROBNP 6.0 07/29/2017 1709    Imaging: No results found.   Assessment & Plan:   No problem-specific Assessment & Plan notes found for this encounter.  Rexene Edison, NP 10/07/2017

## 2017-10-09 ENCOUNTER — Ambulatory Visit: Payer: 59 | Admitting: Pulmonary Disease

## 2017-10-09 NOTE — Assessment & Plan Note (Signed)
Due to pectus deformity s/p surgical repair .  PFT shows severe restriction . This is contributing to his chronic hypercarbia .  Cont on BIPAP w/ O2 At bedtime  .

## 2017-10-09 NOTE — Assessment & Plan Note (Signed)
Recent flare now improving on abx  Finish augmentin

## 2017-10-09 NOTE — Assessment & Plan Note (Signed)
Pt needs to wear oxygen with activity  Long discussion regarding importance of oxygen .  Dangers of hypoxia  He has agreed to wear oxygen  POC device ordered.

## 2017-10-09 NOTE — Assessment & Plan Note (Signed)
Stable cont on current regimen  

## 2017-10-28 ENCOUNTER — Other Ambulatory Visit (HOSPITAL_COMMUNITY): Payer: Self-pay | Admitting: Cardiology

## 2017-10-28 DIAGNOSIS — I509 Heart failure, unspecified: Secondary | ICD-10-CM

## 2017-11-04 ENCOUNTER — Telehealth: Payer: Self-pay | Admitting: Pulmonary Disease

## 2017-11-04 NOTE — Telephone Encounter (Signed)
ATC pt, no answer. Left message for pt to call back.  

## 2017-11-05 NOTE — Telephone Encounter (Signed)
Called [facility] to check on status of simply go mini to see if pt had picked it up yet and per Corene Cornea, he had not been able to get in touch with pt.  Called and spoke with pt to let him know that [facility] had been trying to get in touch with him to get the simply go mini delivered to him and pt stated to me he went and picked it up from the warehouse beginning of February.  Called Corene Cornea back with [facility] and left him a message stating this information.  Nothing further needed at this current time.

## 2017-11-06 ENCOUNTER — Encounter: Payer: Self-pay | Admitting: Pulmonary Disease

## 2017-11-06 ENCOUNTER — Ambulatory Visit: Payer: 59 | Admitting: Pulmonary Disease

## 2017-11-06 VITALS — BP 130/82 | HR 87 | Ht 76.0 in | Wt 206.0 lb

## 2017-11-06 DIAGNOSIS — R140 Abdominal distension (gaseous): Secondary | ICD-10-CM | POA: Diagnosis not present

## 2017-11-06 DIAGNOSIS — Q676 Pectus excavatum: Secondary | ICD-10-CM | POA: Diagnosis not present

## 2017-11-06 DIAGNOSIS — I5032 Chronic diastolic (congestive) heart failure: Secondary | ICD-10-CM | POA: Diagnosis not present

## 2017-11-06 DIAGNOSIS — J9612 Chronic respiratory failure with hypercapnia: Secondary | ICD-10-CM

## 2017-11-06 DIAGNOSIS — J9611 Chronic respiratory failure with hypoxia: Secondary | ICD-10-CM | POA: Diagnosis not present

## 2017-11-06 NOTE — Patient Instructions (Signed)
Severe restrictive lung disease: I am going to refer you back to [facility] to see if they have any other surgical options  Chronic respiratory failure with hypoxemia: It is very important that she use your oxygen whenever you walk  Chronic respiratory failure with hypercarbia: It is very important that she use BiPAP every night It is very important that you keep your weight under control and keep a normal BMI  Abdominal bloating: If not better by next week talk to your primary care physician about seeing a gastrointestinal specialist  We will see you back in 3-4 months

## 2017-11-06 NOTE — Progress Notes (Signed)
Subjective:    Patient ID: [name], male    DOB: July 26, 1981, 37 y.o.   MRN: [id number]  Synopsis: This is a 37 year old gentleman with a past medical history significant for pectus excavatum which has been repaired twice. The first was in childhood at age 66 the second was in 2011 as a redo procedure at [facility]. The redo surgery was performed because he thinks that this was because the chest wall was pressing on his heart. He has been followed by the pulmonary clinic for asthma as well as hypercarbic respiratory failure secondary to his chest wall issues. He also has a history significant for pulmonary hypertension which has been felt to be due to his chronic hypercapnic respiratory failure and hypoxemic respiratory failure as well as some degree of left atrial compression from his chest wall issues. Right heart catheterization performed in 2011 showed no elevation of pulmonary vascular resistance.  He started on BIPAP around 02/2009.    HPI  Chief Complaint  Patient presents with  . Follow-up    pt states he is doing well.     [name] says that he feels like he has been doing better recently.  He says his breathing has improved since he has been using the oxygen more frequently.  He has been using it every time he walks around lately.  He says that he is not having any problems with cough or chest congestion, no wheezing.  However, he has still been dealing with abdominal bloating.  He says this problem has been present for several months now.  He has been having some nausea and vomiting in the last week as well.   Past Medical History:  Diagnosis Date  . Asthma   . CHF (congestive heart failure) ([place])   . Chronic hypercapnic respiratory failure (HCC)    nocturnal  hypoxemic resp failure, on bipap  . Pectus excavatum   . Restrictive lung disease      Review of Systems  Constitutional: Negative for chills, fatigue and fever.  HENT: Negative for postnasal drip,  rhinorrhea and sinus pain.   Respiratory: Positive for shortness of breath. Negative for cough and wheezing.   Cardiovascular: Negative for chest pain, palpitations and leg swelling.  Psychiatric/Behavioral: Behavioral problem: .dbmfu.       Objective:   Physical Exam  Vitals:   11/06/17 1630  BP: 130/82  Pulse: 87  SpO2: 90%  Weight: 206 lb (93.4 kg)  Height: 6\' 4"  (1.93 m)   room air   Gen: well appearing HENT: OP clear, TM's clear, neck supple PULM: Diminished air flow B, normal percussion CV: RRR, no mgr, trace edema GI: BS+, soft, nontender Derm: no cyanosis or rash Psyche: normal mood and affect     Pulmonary function testing: 2012 [facility]: FVC 1.8 L 28% predicted, ratio 81%, FEV1 0.90 L 18% predicted, total lung capacity 2.7 L DLCO 22.8 mL 68% predicted 2019 [facility]: FVC 1.05 L 16% predicted, ratio 71%, FEV1 0.74 L 15% predicted, total lung capacity 2.81 L,  DLCO 21.2 mL 54% pred  CT chest: 2019 extensive parenchymal banding in the perihilar lungs likely representative of postsurgical scarring and atelectasis, mild air trapping, scattered pulmonary nodules largest 8 mm in the right lower lobe, postsurgical changes in the anterior chest wall noted mild herniation anterior right upper lobe noted stable prominent main pulmonary artery dilation stable cardiomegaly      Assessment & Plan:   Pectus excavatum - Plan: Ambulatory referral to Cardiothoracic Surgery  Chronic respiratory failure with hypoxia and hypercapnia (HCC)  Chronic diastolic congestive heart failure (HCC)  Abdominal bloating  Discussion: Alfreddie has done fairly well in terms of shortness of breath since his last visit here because he has been using his oxygen more regularly.  He carries a diagnosis of asthma but his airflow obstruction is not evident on the lung function test.  What is prominent is very severe restriction.  In fact it seems to be worse than prior to his surgery.  I told him today that  the most important thing for him to do was to uses oxygen whenever he exerts himself, wear BiPAP at night, and keep his weight within the normal range.  That all being said, I am very worried about the severity of his restriction at such a young age.  I think he needs to revisit the idea of repeat thoracic surgery.  Plan: Severe restrictive lung disease: I am going to refer you back to [facility] to see if they have any other surgical options  Chronic respiratory failure with hypoxemia: It is very important that she use your oxygen whenever you walk  Chronic respiratory failure with hypercarbia: It is very important that she use BiPAP every night It is very important that you keep your weight under control and keep a normal BMI  Asthma: Continue as needed albuterol  Abdominal bloating: If not better by next week talk to your primary care physician about seeing a gastrointestinal specialist  We will see you back in 3-4 months    Current Outpatient Medications:  .  albuterol (PROAIR HFA) 108 (90 BASE) MCG/ACT inhaler, Inhale 2 puffs into the lungs every 6 (six) hours as needed for wheezing or shortness of breath., Disp: 8.5 each, Rfl: 0 .  amphetamine-dextroamphetamine (ADDERALL) 10 MG tablet, Take 10 mg by mouth 4 (four) times daily as needed., Disp: , Rfl:  .  bisoprolol (ZEBETA) 5 MG tablet, TAKE 1 TABLET (5 MG TOTAL) BY MOUTH DAILY., Disp: 90 tablet, Rfl: 0 .  cetirizine (ZYRTEC) 10 MG tablet, Take 1 tablet (10 mg total) by mouth daily., Disp: 30 tablet, Rfl: 2 .  Fluticasone Furoate-Vilanterol 100-25 MCG/INH AEPB, Inhale 1 puff into the lungs daily., Disp: 1 each, Rfl: 5 .  furosemide (LASIX) 40 MG tablet, TAKE 1 TAB IN AM AND 1/2 TAB IN PM, Disp: 45 tablet, Rfl: 11 .  isosorbide mononitrate (IMDUR) 30 MG 24 hr tablet, TAKE 1 TABLET BY MOUTH EVERY DAY, Disp: 30 tablet, Rfl: 11 .  KLOR-CON M20 20 MEQ tablet, TAKE 2 TABLETS (40 MEQ TOTAL) DAILY BY MOUTH., Disp: 60 tablet, Rfl: 6 .   Omeprazole (PRILOSEC PO), Take by mouth., Disp: , Rfl:

## 2017-11-07 ENCOUNTER — Telehealth: Payer: Self-pay | Admitting: Pulmonary Disease

## 2017-11-07 NOTE — Telephone Encounter (Signed)
Spoke with patient-aware that MW spoke with NP at [place] are to follow care as given from office. Pt will go to ED if symptoms worsen through the night and/or weekend. Nothing more needed at this time.

## 2017-11-07 NOTE — Telephone Encounter (Signed)
BQ is out of the office and not reachable until March 11,2019.  Spoke with patient; Dr Amalia Hailey (??) wanted to speak with someone about patient regarding findings. Was given medications for stomach and was told to increase fluids. CO2 level:45.  MW please contact     They need to speak to DOD about patient-patient does not want to be put in hospital.

## 2017-11-07 NOTE — Telephone Encounter (Signed)
Attempted to call pt but no answer.   Left message for pt to return our call x1 

## 2017-11-07 NOTE — Telephone Encounter (Signed)
Discussed with NP who saw pt not more sob than baseline, mental status ok and same value as last checked here 07/29/17 so nothing new to offer or rec in terms of change rx

## 2017-11-18 ENCOUNTER — Encounter: Payer: Self-pay | Admitting: Gastroenterology

## 2017-12-03 ENCOUNTER — Encounter: Payer: Self-pay | Admitting: Gastroenterology

## 2017-12-03 ENCOUNTER — Ambulatory Visit: Payer: 59 | Admitting: Gastroenterology

## 2017-12-03 VITALS — BP 112/64 | HR 80 | Ht 76.0 in | Wt 207.0 lb

## 2017-12-03 DIAGNOSIS — R140 Abdominal distension (gaseous): Secondary | ICD-10-CM

## 2017-12-03 DIAGNOSIS — K219 Gastro-esophageal reflux disease without esophagitis: Secondary | ICD-10-CM

## 2017-12-03 DIAGNOSIS — R1084 Generalized abdominal pain: Secondary | ICD-10-CM | POA: Diagnosis not present

## 2017-12-03 NOTE — Patient Instructions (Signed)
If you are age 38 or older, your body mass index should be between 23-30. Your Body mass index is 25.2 kg/m. If this is out of the aforementioned range listed, please consider follow up with your Primary Care Provider.  If you are age 34 or younger, your body mass index should be between 19-25. Your Body mass index is 25.2 kg/m. If this is out of the aformentioned range listed, please consider follow up with your Primary Care Provider.   Please purchase the following medications over the counter and take as directed: Florastor probiotic once daily.  Continue taking your Omeprazole 40mg  twice a day  Thank you, [facility]

## 2017-12-03 NOTE — Progress Notes (Signed)
12/03/2017 [name] [id number] 10-19-1980   HISTORY OF PRESENT ILLNESS:  This is a 37 year old male who is new to our office and was referred here by his PCP, Bing Matter, PA-C, for evaluation regarding reflux and bloating.  Describes generalized abdominal bloating with some associated discomfort.  No localized pain.  Has reflux that comes up into his mouth at times. Ultrasound of the right upper quadrant was normal.  CBC and CMP unremarkable from a GI standpoint.  He tells me that he has been on omeprazole 40 mg BID for the past 2 weeks and since then it seems that his symptoms are much improved.  Also describes some breathing issues but has had surgeries for pectus excavatum and is being seen at [facility] today to see if there is anything that can be done to be able to allow more room to expand his lung volumes. Just of note, the patient was very hard to follow/scattered in regards to his history and sequence of events/symptoms.  Past Medical History:  Diagnosis Date  . Asthma   . CHF (congestive heart failure) ([place])   . Chronic hypercapnic respiratory failure (HCC)    nocturnal  hypoxemic resp failure, on bipap  . Pectus excavatum   . Restrictive lung disease    Past Surgical History:  Procedure Laterality Date  . KNEE SURGERY     right  . PECTUS EXCAVATUM REPAIR      reports that he quit smoking about 11 years ago. His smoking use included cigarettes. He has a 0.10 pack-year smoking history. He has never used smokeless tobacco. He reports that he does not drink alcohol or use drugs. family history includes Hypertension in his mother. No Known Allergies    Outpatient Encounter Medications as of 12/03/2017  Medication Sig  . albuterol (PROAIR HFA) 108 (90 BASE) MCG/ACT inhaler Inhale 2 puffs into the lungs every 6 (six) hours as needed for wheezing or shortness of breath.  . amphetamine-dextroamphetamine (ADDERALL) 10 MG tablet Take 10 mg by mouth 4 (four) times daily as  needed.  . bisoprolol (ZEBETA) 5 MG tablet TAKE 1 TABLET (5 MG TOTAL) BY MOUTH DAILY.  . cetirizine (ZYRTEC) 10 MG tablet Take 1 tablet (10 mg total) by mouth daily.  . Fluticasone Furoate-Vilanterol 100-25 MCG/INH AEPB Inhale 1 puff into the lungs daily.  . furosemide (LASIX) 40 MG tablet TAKE 1 TAB IN AM AND 1/2 TAB IN PM  . isosorbide mononitrate (IMDUR) 30 MG 24 hr tablet TAKE 1 TABLET BY MOUTH EVERY DAY  . KLOR-CON M20 20 MEQ tablet TAKE 2 TABLETS (40 MEQ TOTAL) DAILY BY MOUTH.  . Omeprazole (PRILOSEC PO) Take 1 capsule by mouth 2 (two) times daily.    No facility-administered encounter medications on file as of 12/03/2017.     REVIEW OF SYSTEMS  : All other systems reviewed and negative except where noted in the History of Present Illness.  PHYSICAL EXAM: BP 112/64   Pulse 80   Ht 6\' 4"  (1.93 m)   Wt 207 lb (93.9 kg)   BMI 25.20 kg/m  General: Well developed white male in no acute distress Head: Normocephalic and atraumatic Eyes:  Sclerae anicteric, conjunctiva pink. Ears: Normal auditory acuity Lungs: Clear throughout to auscultation; no increased WOB. Heart: Regular rate and rhythm; no M/R/G. Abdomen: Soft, non-distended.  BS present.  Non-tender. Musculoskeletal: Symmetrical with no gross deformities  Skin: No lesions on visible extremities Extremities: No edema  Neurological: Alert oriented x  4, grossly non-focal Psychological:  Alert and cooperative. Normal mood and affect  ASSESSMENT AND PLAN: *37 year old male with complaints of generalized abdominal bloating and discomfort related to that as well as some GERD.  Says that his symptoms have improved significantly since on omeprazole 40 mg BID.  Still with some bloating.  Also describes some breathing issues but has had surgeries for pectus excavatum and is being seen at [facility] today to see if there is anything that can be done to be able to allow more room to expand his lung volumes.  Will continue omeprazole 40 mg BID for  now.  Recommended daily probiotic such as florastor.  Will return for follow-up in about 4 weeks.  **Just of note, the patient was very hard to follow/scattered in regards to his history and sequence of events/symptoms.   CC:  [name], PA-C

## 2017-12-11 ENCOUNTER — Encounter: Payer: Self-pay | Admitting: Gastroenterology

## 2017-12-11 DIAGNOSIS — K219 Gastro-esophageal reflux disease without esophagitis: Secondary | ICD-10-CM | POA: Insufficient documentation

## 2017-12-11 DIAGNOSIS — R140 Abdominal distension (gaseous): Secondary | ICD-10-CM | POA: Insufficient documentation

## 2017-12-15 NOTE — Progress Notes (Signed)
Reviewed and agree with documentation and assessment and plan. K. Veena Micalah Cabezas , MD   

## 2017-12-31 ENCOUNTER — Ambulatory Visit: Payer: 59 | Admitting: Gastroenterology

## 2018-01-13 ENCOUNTER — Telehealth: Payer: Self-pay | Admitting: Pulmonary Disease

## 2018-01-13 NOTE — Telephone Encounter (Signed)
Called patient unable to reach left message to give us a call back.

## 2018-01-13 NOTE — Telephone Encounter (Signed)
Pt is returning call. Call dropped before Cb number could be obtained. Cb listed in chart is [phone].

## 2018-01-14 ENCOUNTER — Other Ambulatory Visit: Payer: Self-pay | Admitting: Pulmonary Disease

## 2018-01-14 DIAGNOSIS — J984 Other disorders of lung: Secondary | ICD-10-CM

## 2018-01-14 DIAGNOSIS — R0602 Shortness of breath: Secondary | ICD-10-CM

## 2018-01-14 NOTE — Telephone Encounter (Signed)
Spoke with pt. He is aware why Dr. Lake Bells wanted him to repeat his CT. Pt would like to have this moved back to July. Will route message back to [place] to have this appointment moved.

## 2018-01-14 NOTE — Telephone Encounter (Signed)
Carolyn from the [facility] is going to call the patient to resc the ct I will get it precerted

## 2018-01-23 ENCOUNTER — Other Ambulatory Visit (HOSPITAL_COMMUNITY): Payer: Self-pay | Admitting: Cardiology

## 2018-01-23 DIAGNOSIS — I509 Heart failure, unspecified: Secondary | ICD-10-CM

## 2018-02-20 ENCOUNTER — Other Ambulatory Visit (HOSPITAL_BASED_OUTPATIENT_CLINIC_OR_DEPARTMENT_OTHER): Payer: 59

## 2018-03-23 ENCOUNTER — Ambulatory Visit (HOSPITAL_BASED_OUTPATIENT_CLINIC_OR_DEPARTMENT_OTHER): Payer: 59

## 2018-04-03 ENCOUNTER — Other Ambulatory Visit: Payer: Self-pay | Admitting: Physician Assistant

## 2018-04-03 NOTE — Telephone Encounter (Signed)
This is Dr. Mclean's pt. CHF pt °

## 2018-05-04 ENCOUNTER — Ambulatory Visit (HOSPITAL_BASED_OUTPATIENT_CLINIC_OR_DEPARTMENT_OTHER)
Admission: RE | Admit: 2018-05-04 | Discharge: 2018-05-04 | Disposition: A | Discharge status: Home / Self Care | Payer: 59 | Source: Ambulatory Visit | Attending: Pulmonary Disease | Admitting: Pulmonary Disease

## 2018-05-04 ENCOUNTER — Ambulatory Visit (INDEPENDENT_AMBULATORY_CARE_PROVIDER_SITE_OTHER)
Admission: RE | Admit: 2018-05-04 | Discharge: 2018-05-04 | Disposition: A | Discharge status: Home / Self Care | Payer: 59 | Source: Ambulatory Visit | Attending: Acute Care | Admitting: Acute Care

## 2018-05-04 ENCOUNTER — Encounter: Payer: Self-pay | Admitting: Acute Care

## 2018-05-04 ENCOUNTER — Ambulatory Visit: Payer: 59 | Admitting: Acute Care

## 2018-05-04 VITALS — BP 118/70 | HR 76 | Temp 97.9°F | Ht 76.0 in | Wt 205.6 lb

## 2018-05-04 DIAGNOSIS — I2721 Secondary pulmonary arterial hypertension: Secondary | ICD-10-CM | POA: Diagnosis not present

## 2018-05-04 DIAGNOSIS — J9612 Chronic respiratory failure with hypercapnia: Secondary | ICD-10-CM

## 2018-05-04 DIAGNOSIS — K7460 Unspecified cirrhosis of liver: Secondary | ICD-10-CM | POA: Insufficient documentation

## 2018-05-04 DIAGNOSIS — Q676 Pectus excavatum: Secondary | ICD-10-CM

## 2018-05-04 DIAGNOSIS — J329 Chronic sinusitis, unspecified: Secondary | ICD-10-CM | POA: Diagnosis not present

## 2018-05-04 DIAGNOSIS — J984 Other disorders of lung: Secondary | ICD-10-CM

## 2018-05-04 DIAGNOSIS — J45909 Unspecified asthma, uncomplicated: Secondary | ICD-10-CM

## 2018-05-04 DIAGNOSIS — R0602 Shortness of breath: Secondary | ICD-10-CM | POA: Diagnosis not present

## 2018-05-04 DIAGNOSIS — J9611 Chronic respiratory failure with hypoxia: Secondary | ICD-10-CM

## 2018-05-04 MED ORDER — AMOXICILLIN-POT CLAVULANATE 875-125 MG PO TABS: 1.0000 | ORAL_TABLET | Freq: Two times a day (BID) | ORAL | 0 refills | Status: DC

## 2018-05-04 MED ORDER — PREDNISONE 10 MG PO TABS: ORAL_TABLET | ORAL | 0 refills | Status: DC

## 2018-05-04 NOTE — Patient Instructions (Addendum)
It is good to see you today. We will treat you with Augmentin 875 mg twice daily  for sinus infection x 7 days Activia yogurt drink daily while on antibiotics. Prednisone taper; 10 mg tablets: 4 tabs x 2 days, 3 tabs x 2 days, 2 tabs x 2 days 1 tab x 2 days then stop. CXR today We will schedule you for the CT scan Dr. Lake Bells ordered for 03/2018. Re-start Breo 1 puff once daily  Rinse mouth after use. Continue Zyrtec daily as you have been doing  Flonase 2 squirts each nare once daily. I will see if I can get a card for you that makes the Breo less costly. Wear your oxygen whenever you exert yourself. Saturation goals should always be > 88% Maintain your weight within normal range. We will check with your DME to see if you qualify for a new BiPAP machine.   Continue on BiPAP at bedtime. You appear to be benefiting from the treatment Goal is to wear for at least 6 hours each night for maximal clinical benefit. Continue to work on weight loss, as the link between excess weight  and sleep apnea is well established.  Do not drive if sleepy. Remember to clean mask, tubing, filter, and reservoir once weekly with soapy water.   Follow up with Dr. Lake Bells or Judson Roch NP  In  2 weeks or before as needed.  Will need a walk on oxygen to ensure adequate flow , may need to increase baseline oxygen. Please contact office for sooner follow up if symptoms do not improve or worsen or seek emergency care

## 2018-05-04 NOTE — Assessment & Plan Note (Addendum)
Worsening acute flare Afebrile Plan: We will treat you with Augmentin 875 mg twice daily  for sinus infection x 7 days Activia yogurt drink daily while on antibiotics. Prednisone taper; 10 mg tablets: 4 tabs x 2 days, 3 tabs x 2 days, 2 tabs x 2 days 1 tab x 2 days then stop. CXR today We will re-schedule the CT scan you were scheduled to have 03/2018 Evaluate for Flu vaccine if better at follow up. Follow up with Dr. Lake Bells or Judson Roch NP  In  2 weeks or before as needed.  Please contact office for sooner follow up if symptoms do not improve or worsen or seek emergency care

## 2018-05-04 NOTE — Assessment & Plan Note (Signed)
Worsening dyspnea Not using Breo Did not go for scheduled HRCT 03/2018 Plan: Re-start Breo 1 puff once daily  Rinse mouth after use. Continue Zyrtec daily as you have been doing  Flonase 2 squirts each nare once daily. I will see if I can get a card for you that makes the Breo less costly. Follow up with Dr. Lake Bells or Judson Roch NP  In  2 weeks or before as needed.  Please contact office for sooner follow up if symptoms do not improve or worsen or seek emergency care

## 2018-05-04 NOTE — Assessment & Plan Note (Addendum)
Saturation 83% on arrival today despite wearing his 2 L Pulsed oxygen  Suspect he is non-compliant Plan: Wear your oxygen whenever you exert yourself. Saturation goals should always be > 88% Will need walk to re-asses oxygen needs at follow up.  I am worried he is not wearing his oxygen as he should . At follow up we need to discuss risks of letting his oxygen saturations drop below 88%. If dyspnea does not improve, mat need to evaluate for elevated PAP.

## 2018-05-04 NOTE — Assessment & Plan Note (Signed)
Compliant with BiPap and nocturnal oxygen Plan: Continue on BiPAP at bedtime. You appear to be benefiting from the treatment Goal is to wear for at least 6 hours each night for maximal clinical benefit. Continue to work on weight loss, as the link between excess weight  and sleep apnea is well established.  Do not drive if sleepy. Remember to clean mask, tubing, filter, and reservoir once weekly with soapy water.

## 2018-05-04 NOTE — Progress Notes (Signed)
History of Present Illness [name] is a 37 y.o. male with pectus excavatum  Synopsis: This is a 37 year old gentleman with a past medical history significant for pectus excavatum which has been repaired twice. The first was in childhood at age 89 the second was in 2011 as a redo procedure at [facility]. The redo surgery was performed because he thinks that this was because the chest wall was pressing on his heart. He has been followed by the pulmonary clinic for asthma as well as hypercarbic respiratory failure secondary to his chest wall issues. He also has a history significant for pulmonary hypertension which has been felt to be due to his chronic hypercapnic respiratory failure and hypoxemic respiratory failure as well as some degree of left atrial compression from his chest wall issues. Right heart catheterization performed in 2011 showed no elevation of pulmonary vascular resistance.  He started on BIPAP around 02/2009.      05/04/2018  Pt. Presents for acute OV with complaints of nasal congestion and shortness of breath. This has been ongoing x about 1-2  weeks. He has noticed headache for the last 3 weeks. He has had sinus pressure and has been awakened from sleep He has been using the saline spray as needed. He has not used any Flonase. He has tried Mucinex with no help. He states he just feels very congested. He did feel some chills yesterday. He feels warm to  me today. Fever at check was normal. He states he didn't get OOB yesterday due to feeling so bad.He does not endorse wheezing. He is more short of breath. He is needing to wear his oxygen more. His saturation was 83% on arrival today. He states he has not been wearing his oxygen as often as he should. He has not been using his BREO due to cost.He did not get the HRCT scan that was scheduled for 03/2018.We discussed that we need to get the CT to evaluate for possible causes of worsening Dyspnea. We will get it re-scheduled  today.He is asking for prednisone taper, as he feels this helps most with his dyspnea. He did re-visit another surgery at [facility], but his doctor there did not recommend surgery. He is concerned about keeping up with his 63 year old son.He denies chest pain, orthopnea or hemoptysis. He states he is compliant with his BIPAP with Oxygen bleed in every night.  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  CT chest: 2019 extensive parenchymal banding in the perihilar lungs likely representative of postsurgical scarring and atelectasis, mild air trapping, scattered pulmonary nodules largest 8 mm in the right lower lobe, postsurgical changes in the anterior chest wall noted mild herniation anterior right upper lobe noted stable prominent main pulmonary artery dilation stable cardiomegaly   CBC Latest Ref Rng & Units 07/29/2017 06/13/2010 06/12/2010  WBC 4.0 - 10.5 K/uL 7.6 14.9(H) -  Hemoglobin 13.0 - 17.0 g/dL 15.2 15.2 16.7  Hematocrit 39.0 - 52.0 % 46.0 44.8 49.0  Platelets 150.0 - 400.0 K/uL 189.0 226 -    BMP Latest Ref Rng & Units 07/29/2017 10/27/2015 10/05/2015  Glucose 70 - 99 mg/dL 94 103(H) 99  BUN 6 - 23 mg/dL 9 12 11   Creatinine 0.40 - 1.50 mg/dL 0.82 0.81 0.92  Sodium 135 - 145 mEq/L 141 139 140  Potassium 3.5 - 5.1 mEq/L 4.1 3.8 3.6  Chloride 96 - 112 mEq/L 93(L) 101 98(L)  CO2 19 - 32 mEq/L 45(H) 29 30  Calcium 8.4 - 10.5 mg/dL 9.3 9.1 9.1    BNP    Component Value Date/Time   BNP 17.3 10/27/2015 1230    ProBNP    Component Value Date/Time   PROBNP 6.0 07/29/2017 1709    PFT    Component Value Date/Time   FEV1PRE 0.73 08/21/2017 1603   FEV1POST 0.74  08/21/2017 1603   FVCPRE 1.02 08/21/2017 1603   FVCPOST 1.05 08/21/2017 1603   TLC 2.81 08/21/2017 1603   DLCOUNC 21.18 08/21/2017 1603   PREFEV1FVCRT 72 08/21/2017 1603   PSTFEV1FVCRT 71 08/21/2017 1603    No results found.   Past medical hx Past Medical History:  Diagnosis Date  . Asthma   . CHF (congestive heart failure) ([place])   . Chronic hypercapnic respiratory failure (HCC)    nocturnal  hypoxemic resp failure, on bipap  . Pectus excavatum   . Restrictive lung disease      Social History   Tobacco Use  . Smoking status: Former Smoker    Packs/day: 0.10    Years: 1.00    Pack years: 0.10    Types: Cigarettes    Last attempt to quit: 09/09/2006    Years since quitting: 11.6  . Smokeless tobacco: Never Used  Substance Use Topics  . Alcohol use: No    Frequency: Never  . Drug use: No    Mr.Yarrow reports that he quit smoking about 11 years ago. His smoking use included cigarettes. He has a 0.10 pack-year smoking history. He has never used smokeless tobacco. He reports that he does not drink alcohol or use drugs.  Tobacco Cessation: Former smoker quit 2008   Past surgical hx, Family hx, Social hx all reviewed.  Current Outpatient Medications on File Prior to Visit  Medication Sig  . albuterol (PROAIR HFA) 108 (90 BASE) MCG/ACT inhaler Inhale 2 puffs into the lungs every 6 (six) hours as needed for wheezing or shortness of breath.  . amphetamine-dextroamphetamine (ADDERALL) 10 MG tablet Take 10 mg by mouth 4 (four) times daily as needed.  . bisoprolol (ZEBETA) 5 MG tablet TAKE 1 TABLET BY MOUTH EVERY DAY  . cetirizine (ZYRTEC) 10 MG tablet Take 1 tablet (10 mg total) by mouth daily.  . Fluticasone Furoate-Vilanterol 100-25 MCG/INH AEPB Inhale 1 puff into the lungs daily.  . furosemide (LASIX) 40 MG tablet TAKE 1 TAB IN AM AND 1/2 TAB IN PM  . isosorbide mononitrate (IMDUR) 30 MG 24 hr tablet TAKE 1 TABLET BY MOUTH EVERY DAY  . KLOR-CON M20 20 MEQ tablet TAKE 2  TABLETS (40 MEQ TOTAL) DAILY BY MOUTH.  . Omeprazole (PRILOSEC PO) Take 1 capsule by mouth 2 (two) times daily.    No current facility-administered medications on file prior to visit.      No Known Allergies  Review Of Systems:  Constitutional:   No  weight loss, night sweats, ? Fevers, + chills, + fatigue, or  lassitude.  HEENT:   + headaches,  Difficulty swallowing,  Tooth/dental problems, or  Sore throat,                No sneezing, itching, ear ache, + nasal congestion, No post nasal drip,  CV:  No chest pain,  Orthopnea, PND, swelling in lower extremities, anasarca, dizziness, palpitations, syncope.   GI  No heartburn, indigestion, abdominal pain, nausea, vomiting, diarrhea, change in bowel habits, loss of appetite, bloody stools.   Resp: + shortness of breath with exertion less  at rest.  + excess mucus, no productive cough,  No non-productive cough,  No coughing up of blood.  No change in color of mucus.  No wheezing.  No chest wall deformity  Skin: no rash or lesions.  GU: no dysuria, change in color of urine, no urgency or frequency.  No flank pain, no hematuria   MS:  No joint pain or swelling.  No decreased range of motion.  No back pain.  Psych:  No change in mood or affect. No depression or anxiety.  No memory loss.   Vital Signs BP 118/70 (BP Location: Left Arm, Cuff Size: Normal)   Pulse 76   Temp 97.9 F (36.6 C) (Oral)   Ht 6\' 4"  (1.93 m)   Wt 205 lb 9.6 oz (93.3 kg)   SpO2 (!) 83%   BMI 25.03 kg/m    Physical Exam:  General- No distress,  A&Ox3,pleasant ENT: + sinus tenderness, TM clear, edematous  nasal mucosa, no oral exudate,no post nasal drip, no LAN Cardiac: S1, S2, regular rate and rhythm, no murmur Chest: No wheeze/ rales/ dullness; no accessory muscle use, no nasal flaring, no sternal retractions, diminished per bases Abd.: Soft Non-tender, ND, BS +, Non-obese Ext: No clubbing cyanosis, edema Neuro:  A&O x 3, MAE x 4, Somewhat  deconditioned Skin: No rashes, warm and dry Psych: normal mood and behavior   Assessment/Plan  Asthmatic bronchitis Worsening dyspnea Not using Breo Did not go for scheduled HRCT 03/2018 Plan: Re-start Breo 1 puff once daily  Rinse mouth after use. Continue Zyrtec daily as you have been doing  Flonase 2 squirts each nare once daily. I will see if I can get a card for you that makes the Breo less costly. Follow up with Dr. Lake Bells or Judson Roch NP  In  2 weeks or before as needed.  Please contact office for sooner follow up if symptoms do not improve or worsen or seek emergency care    Sinusitis, chronic Worsening acute flare Afebrile Plan: We will treat you with Augmentin 875 mg twice daily  for sinus infection x 7 days Activia yogurt drink daily while on antibiotics. Prednisone taper; 10 mg tablets: 4 tabs x 2 days, 3 tabs x 2 days, 2 tabs x 2 days 1 tab x 2 days then stop. CXR today We will re-schedule the CT scan you were scheduled to have 03/2018 Evaluate for Flu vaccine if better at follow up. Follow up with Dr. Lake Bells or Judson Roch NP  In  2 weeks or before as needed.  Please contact office for sooner follow up if symptoms do not improve or worsen or seek emergency care    Chronic respiratory failure with hypoxia and hypercapnia (HCC) Saturation 83% on arrival today despite wearing his 2 L Pulsed oxygen  Suspect he is non-compliant Plan: Wear your oxygen whenever you exert yourself. Saturation goals should always be > 88% Will need walk to re-asses oxygen needs at follow up.  I am worried he is not wearing his oxygen as he should . At follow up we need to discuss risks of letting his oxygen saturations drop below 88%. If dyspnea does not improve, mat need to evaluate for elevated PAP.  Restrictive lung disease Compliant with BiPap and nocturnal oxygen Plan: Continue on BiPAP at bedtime. You appear to be benefiting from the treatment Goal is to wear for at least 6  hours each night for maximal clinical benefit. Continue to work on weight loss, as the link between excess weight  and sleep apnea is well established.  Do not drive if sleepy. Remember to clean mask, tubing, filter, and reservoir once weekly with soapy water.    Magdalen Spatz, NP 05/04/2018  11:35 AM

## 2018-05-05 ENCOUNTER — Telehealth: Payer: Self-pay | Admitting: Acute Care

## 2018-05-05 DIAGNOSIS — R918 Other nonspecific abnormal finding of lung field: Secondary | ICD-10-CM

## 2018-05-05 DIAGNOSIS — R0602 Shortness of breath: Secondary | ICD-10-CM

## 2018-05-05 NOTE — Addendum Note (Signed)
Addended by: Jannette Spanner on: 05/05/2018 04:26 PM   Modules accepted: Orders

## 2018-05-05 NOTE — Telephone Encounter (Signed)
I have called the patient and let him know the results of his CT Chest.I explained the chronic findings noted on the scan. I also explained that his scan showed massive enlargement of his pulmonic trunk which is indicative of possible worsening pulmonary hypertension.   I explained  that his is most likely worsening secondary to chronic hypoxia. ( Sats were 83% upon arrival to the office 8/26 wearing oxygen at 2 L). He states he is compliant with both his oxygen and BiPap with oxygen bleed in every night, however I am unsure of actual compliance.   Jonelle Sidle, please get down load for DME for BiPAP Place order for new BiPAP as current BiPAP is 38 years old Place order for DME to check home BiPAP and ensure it is working properly while awaiting new device.( Pt. Is having morning awakening headaches)  Place order for 2 D Echo to evaluate for pulmonary HTN  Thanks so much

## 2018-05-06 NOTE — Addendum Note (Signed)
Addended by: Jannette Spanner on: 05/06/2018 09:01 AM   Modules accepted: Orders

## 2018-05-06 NOTE — Telephone Encounter (Signed)
All appointments were made, echo and DME orders. Nothing further is needed.

## 2018-05-07 ENCOUNTER — Other Ambulatory Visit (HOSPITAL_COMMUNITY): Payer: 59

## 2018-05-08 ENCOUNTER — Other Ambulatory Visit (HOSPITAL_COMMUNITY): Payer: 59

## 2018-05-08 ENCOUNTER — Telehealth: Payer: Self-pay | Admitting: Acute Care
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
## 2018-05-25 ENCOUNTER — Other Ambulatory Visit (HOSPITAL_COMMUNITY): Payer: Self-pay | Admitting: Cardiology

## 2018-05-25 DIAGNOSIS — I509 Heart failure, unspecified: Secondary | ICD-10-CM

## 2018-05-28 ENCOUNTER — Ambulatory Visit (INDEPENDENT_AMBULATORY_CARE_PROVIDER_SITE_OTHER): Payer: 59 | Admitting: Pulmonary Disease

## 2018-05-28 ENCOUNTER — Encounter: Payer: Self-pay | Admitting: Pulmonary Disease

## 2018-05-28 VITALS — BP 122/70 | HR 84 | Ht 75.98 in | Wt 198.8 lb

## 2018-05-28 DIAGNOSIS — Z23 Encounter for immunization: Secondary | ICD-10-CM

## 2018-05-28 DIAGNOSIS — J984 Other disorders of lung: Secondary | ICD-10-CM | POA: Diagnosis not present

## 2018-05-28 DIAGNOSIS — J9612 Chronic respiratory failure with hypercapnia: Secondary | ICD-10-CM

## 2018-05-28 DIAGNOSIS — I272 Pulmonary hypertension, unspecified: Secondary | ICD-10-CM | POA: Diagnosis not present

## 2018-05-28 DIAGNOSIS — Q676 Pectus excavatum: Secondary | ICD-10-CM

## 2018-05-28 DIAGNOSIS — J45909 Unspecified asthma, uncomplicated: Secondary | ICD-10-CM

## 2018-05-28 DIAGNOSIS — J9611 Chronic respiratory failure with hypoxia: Secondary | ICD-10-CM | POA: Diagnosis not present

## 2018-05-28 MED ORDER — ALBUTEROL SULFATE (2.5 MG/3ML) 0.083% IN NEBU: 2.5000 mg | INHALATION_SOLUTION | Freq: Four times a day (QID) | RESPIRATORY_TRACT | 11 refills | Status: DC | PRN

## 2018-05-28 NOTE — Addendum Note (Signed)
Addended by: Jannette Spanner on: 05/28/2018 10:22 AM   Modules accepted: Orders

## 2018-05-28 NOTE — Patient Instructions (Signed)
Chronic respiratory failure with hypoxemia: You absolutely must wear oxygen 24 hours a day, no questions asked  Restrictive lung disease causing chronic respiratory failure with hypercarbia: Use BiPAP every night We will arrange for a BiPAP titration study Keep your weight as low as possible, preferably under 200 pounds  Asthma: Continue taking Breo every day Start using albuterol nebulized twice a day We will check your blood today to look for evidence of an allergy contributing to this Flu shot today Practice good hand hygiene  In general it is important for you to stay active and exercise regularly  Follow-up with me in 2 to 3 months or sooner if needed

## 2018-05-28 NOTE — Progress Notes (Signed)
Subjective:    Patient ID: [name], male    DOB: 1981/04/26, 37 y.o.   MRN: [id number]  Synopsis: This is a 37 year old gentleman with a past medical history significant for pectus excavatum which has been repaired twice. The first was in childhood at age 59 the second was in 2011 as a redo procedure at [facility]. The redo surgery was performed because he thinks that this was because the chest wall was pressing on his heart. He has been followed by the pulmonary clinic for asthma as well as hypercarbic respiratory failure secondary to his chest wall issues. He also has a history significant for pulmonary hypertension which has been felt to be due to his chronic hypercapnic respiratory failure and hypoxemic respiratory failure as well as some degree of left atrial compression from his chest wall issues. Right heart catheterization performed in 2011 showed no elevation of pulmonary vascular resistance.  He started on BIPAP around 02/2009.    HPI  Chief Complaint  Patient presents with  . Follow-up    continues to have SOB   [name] tells me that when he was dog sitting earlier this year he had quite a bit of breathing difficulty and his asthma flared up quite a bit.  He saw our nurse practitioner Sarah who gave him Augmentin and prednisone which was quite helpful.  He says that the Brio has also been helpful.  He currently does not use albuterol nebulized.  He says that he uses his oxygen when he gets home at night and whenever he is in the car.  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  He and his family went to [employer] earlier this year and he says that he did not wear oxygen while there.  He says that he is very compliant with his BiPAP mask.  He says that he is never actually had a formal BiPAP titration study.  He says his weight is down a bit because he is been so busy with work.    Past Medical History:  Diagnosis Date  . Asthma   . CHF  (congestive heart failure) ([place])   . Chronic hypercapnic respiratory failure (HCC)    nocturnal  hypoxemic resp failure, on bipap  . Pectus excavatum   . Restrictive lung disease      Review of Systems  Constitutional: Negative for chills, fatigue and fever.  HENT: Negative for postnasal drip, rhinorrhea and sinus pain.   Respiratory: Positive for shortness of breath. Negative for cough and wheezing.   Cardiovascular: Negative for chest pain, palpitations and leg swelling.  Psychiatric/Behavioral: Behavioral problem: .dbmfu.       Objective:   Physical Exam  Vitals:   05/28/18 0942 05/28/18 0944  BP: 122/70   Pulse: 84   SpO2: (!) 86% 93%  Weight: 198 lb 12.8 oz (90.2 kg)   Height: 6' 3.98" (1.93 m)    room air   Gen: chronically ill appearing HENT: OP clear, TM's clear, neck supple PULM: Diminished left base B, normal percussion CV: RRR, no mgr, trace edema GI: BS+, soft, nontender Derm: no cyanosis or rash Psyche: normal mood and affect    Pulmonary function testing: 2012 [facility]: FVC 1.8 L 28% predicted, ratio 81%, FEV1 0.90 L 18% predicted, total lung capacity 2.7 L DLCO 22.8 mL 68% predicted 2019 [facility]: FVC 1.05 L 16% predicted, ratio 71%, FEV1 0.74 L 15% predicted, total lung capacity 2.81 L,  DLCO 21.2 mL  54% pred  CT chest: 2019 extensive parenchymal banding in the perihilar lungs likely representative of postsurgical scarring and atelectasis, mild air trapping, scattered pulmonary nodules largest 8 mm in the right lower lobe, postsurgical changes in the anterior chest wall noted mild herniation anterior right upper lobe noted stable prominent main pulmonary artery dilation stable cardiomegaly August 2019 high-resolution CT scan of the chest showed narrowing of the left mainstem bronchus, some pericardial effusion, no evidence of interstitial lung disease, massive enlargement of pulmonary arteries.  Images independently reviewed.     Assessment & Plan:    Pulmonary hypertension ([place]) - Plan: CBC w/Diff, IgE, Ambulatory Referral for DME, Bipap titration  Restrictive lung disease - Plan: CBC w/Diff, IgE, Ambulatory Referral for DME, Bipap titration  Chronic respiratory failure with hypoxia and hypercapnia ([place]) - Plan: Cpap titration  Pectus excavatum  Asthmatic bronchitis without complication, unspecified asthma severity, unspecified whether persistent  Discussion: Juanda Crumble and I had a lengthy conversation today.  I believe that he has asthma which was exacerbated by being around a dog this year.  This only complicates his severe restrictive lung disease from pectus excavatum.  We learned this year that he is unable to have further surgery for this condition.  I believe pulmonary hypertension is worsening purely due to underventilation and under oxygenation from his pectus excavatum, complicated by asthma.  I explained to him in no uncertain terms today that he needs to wear oxygen 24 hours a day in order to prevent developing further worsening of his pulmonary hypertension and right heart failure.   Chronic respiratory failure with hypoxemia: You absolutely must wear oxygen 24 hours a day, no questions asked  Restrictive lung disease causing chronic respiratory failure with hypercarbia: Use BiPAP every night We will arrange for a BiPAP titration study Keep your weight as low as possible, preferably under 200 pounds  Asthma: Continue taking Breo every day Start using albuterol nebulized twice a day We will check your blood today to look for evidence of an allergy contributing to this Flu shot today Practice good hand hygiene  In general it is important for you to stay active and exercise regularly  Follow-up with me in 2 to 3 months or sooner if needed  > 50% of this 30 minute visit spent face to face   Current Outpatient Medications:  .  albuterol (PROAIR HFA) 108 (90 BASE) MCG/ACT inhaler, Inhale 2 puffs into the lungs  every 6 (six) hours as needed for wheezing or shortness of breath., Disp: 8.5 each, Rfl: 0 .  amphetamine-dextroamphetamine (ADDERALL) 10 MG tablet, Take 10 mg by mouth 4 (four) times daily as needed., Disp: , Rfl:  .  bisoprolol (ZEBETA) 5 MG tablet, TAKE 1 TABLET BY MOUTH EVERY DAY, Disp: 90 tablet, Rfl: 0 .  cetirizine (ZYRTEC) 10 MG tablet, Take 1 tablet (10 mg total) by mouth daily., Disp: 30 tablet, Rfl: 2 .  Fluticasone Furoate-Vilanterol 100-25 MCG/INH AEPB, Inhale 1 puff into the lungs daily., Disp: 1 each, Rfl: 5 .  furosemide (LASIX) 40 MG tablet, TAKE 1 TAB IN AM AND 1/2 TAB IN PM, Disp: 45 tablet, Rfl: 11 .  isosorbide mononitrate (IMDUR) 30 MG 24 hr tablet, TAKE 1 TABLET BY MOUTH EVERY DAY, Disp: 30 tablet, Rfl: 11 .  KLOR-CON M20 20 MEQ tablet, TAKE 2 TABLETS (40 MEQ TOTAL) DAILY BY MOUTH., Disp: 180 tablet, Rfl: 0 .  Omeprazole (PRILOSEC PO), Take 1 capsule by mouth 2 (two) times daily. , Disp: , Rfl:  .  albuterol (PROVENTIL) (2.5 MG/3ML) 0.083% nebulizer solution, Take 3 mLs (2.5 mg total) by nebulization every 6 (six) hours as needed for wheezing or shortness of breath., Disp: 75 mL, Rfl: 11

## 2018-05-29 ENCOUNTER — Telehealth: Payer: Self-pay | Admitting: Pulmonary Disease

## 2018-05-29 NOTE — Telephone Encounter (Signed)
Spoke with Jason-[facility]-pt has large balance with [facility] and they are unable to give nebulizer machine. Pt will have to go through another DME or purchase at local pharmacy.    LMTCB for patient to explain this.

## 2018-06-01 NOTE — Telephone Encounter (Signed)
Called spoke with patient and discussed [facility]'s reason for not supplying neb machine Patient stated that he has already paid off his balance with [facility] "several weeks ago" Advised patient that he needed to contact [facility] regarding this issue - patient voiced his understanding  Patient also stated that he is going to get a neb machine "from a friend" but did also inform patient that he can check a local pharmacy, a local medical supply store or we can send an order to a different DME company  Patient voiced his understanding Nothing further needed at this time; will sign off

## 2018-06-30 ENCOUNTER — Other Ambulatory Visit: Payer: Self-pay | Admitting: Cardiology

## 2018-07-01 ENCOUNTER — Encounter (HOSPITAL_BASED_OUTPATIENT_CLINIC_OR_DEPARTMENT_OTHER): Payer: 59

## 2018-07-15 ENCOUNTER — Encounter

## 2018-07-15 ENCOUNTER — Ambulatory Visit (HOSPITAL_BASED_OUTPATIENT_CLINIC_OR_DEPARTMENT_OTHER): Payer: 59 | Attending: Pulmonary Disease | Admitting: Pulmonary Disease

## 2018-07-15 VITALS — Ht 76.0 in | Wt 205.0 lb

## 2018-07-15 DIAGNOSIS — J984 Other disorders of lung: Secondary | ICD-10-CM | POA: Insufficient documentation

## 2018-07-15 DIAGNOSIS — I272 Pulmonary hypertension, unspecified: Secondary | ICD-10-CM

## 2018-07-20 DIAGNOSIS — G4733 Obstructive sleep apnea (adult) (pediatric): Secondary | ICD-10-CM | POA: Diagnosis not present

## 2018-07-20 NOTE — Procedures (Signed)
  Patient Name: [name], [name] Date: 07/15/2018   Gender: Male  D.O.B: 08-Sep-1981  Age (years): 37  Referring Provider: Simonne Maffucci  Height (inches): 76  Interpreting Physician: Kara Mead MD, ABSM  Weight (lbs): 205  RPSGT: Laren Everts  BMI: 25  MRN: [id number]  Neck Size: 17.00  <br> <br>  CLINICAL INFORMATION  The patient is referred for a BiPAP titration to treat hypoventilation due to restrictive lung disease / pectus excavatum causing pulmonary hypertension SLEEP STUDY TECHNIQUE  As per the AASM Manual for the Scoring of Sleep and Associated Events v2.3 (April 2016) with a hypopnea requiring 4% desaturations. The channels recorded and monitored were frontal, central and occipital EEG, electrooculogram (EOG), submentalis EMG (chin), nasal and oral airflow, thoracic and abdominal wall motion, anterior tibialis EMG, snore microphone, electrocardiogram, and pulse oximetry. Bilevel positive airway pressure (BPAP) was initiated at the beginning of the study and titrated to treat sleep-disordered breathing. MEDICATIONS  Medications self-administered by patient taken the night of the study : N/A RESPIRATORY PARAMETERS  Optimal IPAP Pressure (cm): 13 AHI at Optimal Pressure (/hr) 0.0  Optimal EPAP Pressure (cm): 9      Overall Minimal O2 (%): 71.0 Minimal O2 at Optimal Pressure (%): 89.0  SLEEP ARCHITECTURE  Start Time: 11:05:01 PM Stop Time: 5:36:43 AM Total Time (min): 391.7 Total Sleep Time (min): 329.5  Sleep Latency (min): 4.7 Sleep Efficiency (%): 84.1% REM Latency (min): 100.0 WASO (min): 57.5  Stage N1 (%): 6.2% Stage N2 (%): 84.4% Stage N3 (%): 1.2% Stage R (%): 8.2  Supine (%): 1.76 Arousal Index (/hr): 21.3          CARDIAC DATA  The 2 lead EKG demonstrated sinus rhythm. The mean heart rate was 84.7 beats per minute. Other EKG findings include: PVCs.  LEG MOVEMENT DATA  The total Periodic Limb Movements of Sleep (PLMS) were 0. The PLMS index was 0.0. A  PLMS index of <15 is considered normal in adults. IMPRESSIONS  - An optimal PAP pressure was selected for this patient ( 13 /9 cm of water) - Central sleep apnea was not noted during this titration (CAI = 0.2/h).  - Severe oxygen desaturations were observed during this titration (min O2 = 71.0%) corrected by 3L O2 blended. - The patient snored with soft snoring volume.  - 2-lead EKG demonstrated: PVCs  - Clinically significant periodic limb movements were not noted during this study. Arousals associated with PLMs were rare. 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 - 3L blended into bilevel - Consider an oral guard for Bruxism.  - Avoid alcohol, sedatives and other CNS depressants that may worsen sleep apnea and disrupt normal sleep architecture.  - Sleep hygiene should be reviewed to assess factors that may improve sleep quality.  - Weight management and regular exercise should be initiated or continued.  - Return to Sleep Center for re-evaluation after 4 weeks of therapy. Note that ventilation was not measured during this study (ETCO2) . Bilevel was titrated to apneas & snoring. If he continues to do poorly clinically inspite of bilevel, consider NIV with AVAPS mode.   Kara Mead MD Board Certified in [place]

## 2018-07-24 NOTE — Addendum Note (Signed)
Addended by: Vivia Ewing on: 07/24/2018 12:33 PM   Modules accepted: Orders

## 2018-07-27 ENCOUNTER — Other Ambulatory Visit: Payer: Self-pay | Admitting: Pulmonary Disease

## 2018-07-27 DIAGNOSIS — G4733 Obstructive sleep apnea (adult) (pediatric): Secondary | ICD-10-CM

## 2018-07-29 ENCOUNTER — Telehealth: Payer: Self-pay | Admitting: Pulmonary Disease

## 2018-07-29 NOTE — Telephone Encounter (Signed)
Called and spoke with [place], [facility].  He wanted to let Dr. Lake Bells know that the Patient is only having pressure change and not a new machine.  Corene Cornea had to get approval from the Respiratory Manager at [facility], so they can provide pressure change.  The Patient owes over $600 for past equipment from [facility].  Will route to Dr. Lake Bells as Juluis Rainier

## 2018-07-30 NOTE — Telephone Encounter (Signed)
OK 

## 2018-08-14 ENCOUNTER — Encounter: Payer: Self-pay | Admitting: Primary Care

## 2018-08-14 ENCOUNTER — Ambulatory Visit (INDEPENDENT_AMBULATORY_CARE_PROVIDER_SITE_OTHER): Payer: 59 | Admitting: Primary Care

## 2018-08-14 DIAGNOSIS — J9611 Chronic respiratory failure with hypoxia: Secondary | ICD-10-CM

## 2018-08-14 DIAGNOSIS — J9612 Chronic respiratory failure with hypercapnia: Secondary | ICD-10-CM

## 2018-08-14 MED ORDER — ALBUTEROL SULFATE (2.5 MG/3ML) 0.083% IN NEBU: 2.5000 mg | INHALATION_SOLUTION | Freq: Four times a day (QID) | RESPIRATORY_TRACT | 11 refills | Status: DC | PRN

## 2018-08-14 MED ORDER — AMOXICILLIN-POT CLAVULANATE 875-125 MG PO TABS: 1.0000 | ORAL_TABLET | Freq: Two times a day (BID) | ORAL | 0 refills | Status: DC

## 2018-08-14 MED ORDER — PREDNISONE 10 MG PO TABS: ORAL_TABLET | ORAL | 0 refills | Status: DC

## 2018-08-14 NOTE — Progress Notes (Signed)
@Patient  ID: [name], male    DOB: 12-25-1980, 37 y.o.   MRN: [id number]  Chief Complaint  Patient presents with  . Acute Visit    cough with whitish mucus, wheezing, sore throat, head congestion, post nasal drip    Referring provider: [name], PA-C  Synopsis: This is a 37 year old gentleman with a past medical history significant for pectus excavatum which has been repaired twice. The first was in childhood at age 79 the second was in 2011 as a redo procedure at [facility]. The redo surgery was performed because he thinks that this was because the chest wall was pressing on his heart. He has been followed by the pulmonary clinic for asthma as well as hypercarbic respiratory failure secondary to his chest wall issues. He also has a history significant for pulmonary hypertension which has been felt to be due to his chronic hypercapnic respiratory failure and hypoxemic respiratory failure as well as some degree of left atrial compression from his chest wall issues. Right heart catheterization performed in 2011 showed no elevation of pulmonary vascular resistance.  He started on BIPAP around 02/2009.    HPI: 37 year old male, former smoker quit 2008. PMH significant for pectus excavatum, restrictive lung disease, moderate persistent asthma, pulmonary hypertension, congestive heart failure, sinusitis, GERD. Patient of Dr. Lake Bells, last seen on 05/28/18. Maintained on Breo. Compliance with BIPAP mask.   08/14/2018 Patient presents today for acute visit with complaints of cough and head congestion. Cough is somewhat productive. Associated sinus and head congestion/pressure. Came in today to get ahead of his symptoms. Hasn't been treated with antibiotic course in over 3-4 months. Breathing is a little more labored than usual. He is wearing oxygen today in the office, he has been trying to wear it 24/7 like recommended. Has not started albuterol nebulizer, states friend bought him a  machine and needs medication sent to pharmacy. States that he's been wearing his BIPAP everynight. Has repeat titration study Dec 19th.    No Known Allergies  Immunization History  Administered Date(s) Administered  . Influenza Split 06/16/2012  . Influenza Whole 06/09/2010, 06/10/2011  . Influenza,inj,Quad PF,6+ Mos 06/08/2013, 08/10/2015, 05/28/2018  . Influenza-Unspecified 06/09/2014    Past Medical History:  Diagnosis Date  . Asthma   . CHF (congestive heart failure) ([place])   . Chronic hypercapnic respiratory failure (HCC)    nocturnal  hypoxemic resp failure, on bipap  . Pectus excavatum   . Restrictive lung disease     Tobacco History: Social History   Tobacco Use  Smoking Status Former Smoker  . Packs/day: 0.10  . Years: 1.00  . Pack years: 0.10  . Types: Cigarettes  . Last attempt to quit: 09/09/2006  . Years since quitting: 11.9  Smokeless Tobacco Never Used   Counseling given: Not Answered   Outpatient Medications Prior to Visit  Medication Sig Dispense Refill  . albuterol (PROAIR HFA) 108 (90 BASE) MCG/ACT inhaler Inhale 2 puffs into the lungs every 6 (six) hours as needed for wheezing or shortness of breath. 8.5 each 0  . amphetamine-dextroamphetamine (ADDERALL) 10 MG tablet Take 10 mg by mouth 4 (four) times daily as needed.    . bisoprolol (ZEBETA) 5 MG tablet TAKE 1 TABLET BY MOUTH EVERY DAY 90 tablet 0  . cetirizine (ZYRTEC) 10 MG tablet Take 1 tablet (10 mg total) by mouth daily. 30 tablet 2  . Fluticasone Furoate-Vilanterol 100-25 MCG/INH AEPB Inhale 1 puff into the lungs daily. 1 each  5  . furosemide (LASIX) 40 MG tablet TAKE 1 TAB IN AM AND 1/2 TAB IN PM 45 tablet 11  . isosorbide mononitrate (IMDUR) 30 MG 24 hr tablet TAKE 1 TABLET BY MOUTH EVERY DAY 30 tablet 11  . KLOR-CON M20 20 MEQ tablet TAKE 2 TABLETS (40 MEQ TOTAL) DAILY BY MOUTH. 180 tablet 0  . Omeprazole (PRILOSEC PO) Take 1 capsule by mouth 2 (two) times daily.     Marland Kitchen albuterol (PROVENTIL)  (2.5 MG/3ML) 0.083% nebulizer solution Take 3 mLs (2.5 mg total) by nebulization every 6 (six) hours as needed for wheezing or shortness of breath. 75 mL 11   No facility-administered medications prior to visit.     Review of Systems  Review of Systems  Constitutional: Negative.   HENT: Positive for congestion, postnasal drip and sinus pressure.   Respiratory: Positive for cough and wheezing.   Cardiovascular: Negative.     Physical Exam  BP 120/74 (BP Location: Right Arm, Cuff Size: Normal)   Pulse 89   Temp 98.6 F (37 C)   Ht 6\' 4"  (1.93 m)   Wt 210 lb 12.8 oz (95.6 kg)   SpO2 93%   BMI 25.66 kg/m  Physical Exam  Constitutional: He is oriented to person, place, and time. He appears well-developed and well-nourished. No distress.  HENT:  Head: Normocephalic and atraumatic.  Eyes: Pupils are equal, round, and reactive to light. EOM are normal.  Neck: Normal range of motion. Neck supple.  Cardiovascular: Normal rate.  Pulmonary/Chest: Effort normal. No stridor. No respiratory distress. He has wheezes.  Scattered exp wheezing and rhonchi  On 2L oxygen   Musculoskeletal: Normal range of motion.  Neurological: He is alert and oriented to person, place, and time.  Skin: Skin is warm and dry.  Psychiatric: He has a normal mood and affect. His behavior is normal. Judgment and thought content normal.     Lab Results:  CBC    Component Value Date/Time   WBC 7.6 07/29/2017 1709   RBC 4.98 07/29/2017 1709   HGB 15.2 07/29/2017 1709   HCT 46.0 07/29/2017 1709   PLT 189.0 07/29/2017 1709   MCV 92.3 07/29/2017 1709   MCH 29.5 06/13/2010 0620   MCHC 33.2 07/29/2017 1709   RDW 13.7 07/29/2017 1709   LYMPHSABS 1.3 07/29/2017 1709   MONOABS 0.6 07/29/2017 1709   EOSABS 0.1 07/29/2017 1709   BASOSABS 0.0 07/29/2017 1709    BMET    Component Value Date/Time   NA 141 07/29/2017 1709   K 4.1 07/29/2017 1709   CL 93 (L) 07/29/2017 1709   CO2 45 (H) 07/29/2017 1709    GLUCOSE 94 07/29/2017 1709   BUN 9 07/29/2017 1709   CREATININE 0.82 07/29/2017 1709   CALCIUM 9.3 07/29/2017 1709   GFRNONAA >60 10/27/2015 1230   GFRAA >60 10/27/2015 1230    BNP    Component Value Date/Time   BNP 17.3 10/27/2015 1230    ProBNP    Component Value Date/Time   PROBNP 6.0 07/29/2017 1709    Imaging: No results found.   Assessment & Plan:   Asthmatic bronchitis Acute exacerbation Needs RX for Augmentin 1 tab BID x 7 days and prednisone taper Recommend mucinex BID for congestion and delsym cough syrup  Encouraged patient use Albuterol neb twice a day per Dr. Kathee Delton recommendations (RX sent to pharmacy) Return if symptoms do not improve or worsen in any way  Chronic respiratory failure with hypoxia and hypercapnia (HCC) O2  sat 93% 2L POC Encourage patient wear oxygen 24/7 per Dr. Lake Bells recommendations BIPAP everynight, repeat titration study planned for Dec 12th    Martyn Ehrich, NP 08/14/2018

## 2018-08-14 NOTE — Patient Instructions (Addendum)
Albuterol nebulizer twice a day (sending in RX to pharmacy)  Make sure to wear oxygen 24/7 as much as possible   Mucinex twice a day for congestion  Continue delsym cough syrup    RX: Prednisone taper (40mg  x 2 days; 30mg  x 2 days; 20mg  x 2 days; 10mg  x 2 days) Augmentin 1 tab twice a day x 7 days

## 2018-08-14 NOTE — Assessment & Plan Note (Addendum)
Acute exacerbation Needs RX for Augmentin 1 tab BID x 7 days and prednisone taper Recommend mucinex BID for congestion and delsym cough syrup  Encouraged patient use Albuterol neb twice a day per Dr. Kathee Delton recommendations (RX sent to pharmacy) Return if symptoms do not improve or worsen in any way

## 2018-08-14 NOTE — Assessment & Plan Note (Signed)
O2 sat 93% 2L POC Encourage patient wear oxygen 24/7 per Dr. Lake Bells recommendations BIPAP everynight, repeat titration study planned for Dec 12th

## 2018-08-18 NOTE — Progress Notes (Signed)
Reviewed, agree 

## 2018-08-22 ENCOUNTER — Other Ambulatory Visit (HOSPITAL_COMMUNITY): Payer: Self-pay | Admitting: Cardiology

## 2018-08-22 DIAGNOSIS — I509 Heart failure, unspecified: Secondary | ICD-10-CM

## 2018-08-29 ENCOUNTER — Encounter (HOSPITAL_BASED_OUTPATIENT_CLINIC_OR_DEPARTMENT_OTHER): Payer: 59

## 2018-10-12 ENCOUNTER — Encounter (HOSPITAL_BASED_OUTPATIENT_CLINIC_OR_DEPARTMENT_OTHER): Payer: 59

## 2018-10-20 ENCOUNTER — Other Ambulatory Visit (HOSPITAL_COMMUNITY): Payer: Self-pay | Admitting: Cardiology

## 2018-10-20 DIAGNOSIS — I509 Heart failure, unspecified: Secondary | ICD-10-CM

## 2018-10-20 NOTE — Telephone Encounter (Signed)
Pt needs for appt for refills [phone]

## 2018-11-23 ENCOUNTER — Other Ambulatory Visit: Payer: Self-pay | Admitting: Cardiology

## 2018-11-26 ENCOUNTER — Other Ambulatory Visit (HOSPITAL_COMMUNITY): Payer: Self-pay | Admitting: Cardiology

## 2018-11-26 DIAGNOSIS — I509 Heart failure, unspecified: Secondary | ICD-10-CM

## 2018-12-20 ENCOUNTER — Other Ambulatory Visit (HOSPITAL_COMMUNITY): Payer: Self-pay | Admitting: Cardiology

## 2018-12-20 DIAGNOSIS — I509 Heart failure, unspecified: Secondary | ICD-10-CM

## 2018-12-26 ENCOUNTER — Other Ambulatory Visit: Payer: Self-pay | Admitting: Cardiology

## 2018-12-27 ENCOUNTER — Other Ambulatory Visit (HOSPITAL_COMMUNITY): Payer: Self-pay | Admitting: Cardiology

## 2018-12-27 DIAGNOSIS — I509 Heart failure, unspecified: Secondary | ICD-10-CM

## 2018-12-30 ENCOUNTER — Other Ambulatory Visit: Payer: Self-pay | Admitting: Cardiology

## 2019-02-08 ENCOUNTER — Telehealth: Payer: Self-pay | Admitting: Pulmonary Disease

## 2019-02-08 MED ORDER — PREDNISONE 10 MG PO TABS: ORAL_TABLET | ORAL | 0 refills | Status: DC

## 2019-02-08 NOTE — Telephone Encounter (Signed)
Any increase in his weight? Any swelling? Is he still taking breo and lasix?

## 2019-02-08 NOTE — Telephone Encounter (Signed)
Called & spoke w/ pt regarding Beth's inquiries.  Pt states he's gained a little weight, about 4-5 lb increase. Denies edema/swelling in extremities. Is not taking Breo currently, states it hasn't worked well for him in the past. States he is taking his rescue inhaler 1-2 times/daily, using his O2 concentrator, and taking lasix 1 tab in AM and 1/2 tab in PM.  Beth, please advise on your recommendations for this patient. Thank you.

## 2019-02-08 NOTE — Telephone Encounter (Signed)
Relayed recommendations to pt.  Nothing further is needed. 

## 2019-02-08 NOTE — Telephone Encounter (Signed)
Primary Pulmonologist: BQ Last office visit and with whom: 08/14/18 with Derl Barrow What do we see them for (pulmonary problems): pulm htn/ chron resp fail Last OV assessment/plan: Instructions   Albuterol nebulizer twice a day (sending in RX to pharmacy)  Make sure to wear oxygen 24/7 as much as possible   Mucinex twice a day for congestion  Continue delsym cough syrup    RX: Prednisone taper (40mg  x 2 days; 30mg  x 2 days; 20mg  x 2 days; 10mg  x 2 days) Augmentin 1 tab twice a day x 7 days       Was appointment offered to patient (explain)?  Pt wanted appt but this would be an acute appt   Reason for call: Called and spoke with pt who stated he has had increased SOB x3-4 days now. Pt also feels like he may be getting a sinus infection as he has had pain in his sinuses as well as a headache. Pt has taken tylenol to see if that would help and has had little relief. Pt denies any complaints of cough or fever.   Pt states that the nasal drainage is clear in color.   Pt is taking zyrtec as directed. Pt is not taking mucinex which was stated in last OV notes as pt stated he was doing this only as needed.  Pt has had to use rescue inhaler once or twice daily to help with symptoms and has had to use nebulizer once daily.  Beth, please advise on recs for pt. Thanks!

## 2019-02-08 NOTE — Telephone Encounter (Signed)
Pt is calling back [phone]

## 2019-02-08 NOTE — Telephone Encounter (Signed)
Make sure he is wearing oxygen 24 hours a day. I would resume the BREO and use nebulizer twice a day. Will send in a small prednisone taper. Recommend nasal saline rinses twice daily (netti pot or ocean nasal spray) and flonase nasal spray daily in morning after saline rinse. Continue zyrtec and tylenol.

## 2019-03-04 ENCOUNTER — Telehealth: Payer: Self-pay | Admitting: Pulmonary Disease

## 2019-03-04 ENCOUNTER — Other Ambulatory Visit: Payer: Self-pay | Admitting: Cardiology

## 2019-03-04 MED ORDER — DOXYCYCLINE HYCLATE 100 MG PO TABS: 100.0000 mg | ORAL_TABLET | Freq: Two times a day (BID) | ORAL | 0 refills | Status: AC

## 2019-03-04 MED ORDER — PREDNISONE 10 MG PO TABS: ORAL_TABLET | ORAL | 0 refills | Status: DC

## 2019-03-04 NOTE — Telephone Encounter (Signed)
Pt returning call and can be reached @ [phone].[name]

## 2019-03-04 NOTE — Telephone Encounter (Signed)
Thank you   B

## 2019-03-04 NOTE — Telephone Encounter (Signed)
Please see above recommendations   Aaron Edelman

## 2019-03-04 NOTE — Telephone Encounter (Signed)
Please refer patient to outpatient PEC testing for coronavirus.  Please schedule the patient is a follow-up visit in 4 to 5 days with EW or another APP once we have confirmed negative cover testing.  Wyn Quaker, FNP

## 2019-03-04 NOTE — Telephone Encounter (Signed)
Primary Pulmonologist: McQuaid Last office visit and with whom: 08/14/18, Beth,NP What do we see them for (pulmonary problems): Chronic Resp failure Last OV assessment/plan:   Instructions  Albuterol nebulizer twice a day (sending in RX to pharmacy)  Make sure to wear oxygen 24/7 as much as possible   Mucinex twice a day for congestion  Continue delsym cough syrup    RX: Prednisone taper (40mg  x 2 days; 30mg  x 2 days; 20mg  x 2 days; 10mg  x 2 days) Augmentin 1 tab twice a day x 7 days        Reason for call:  Called and spoke with Patient.  Patient stated he has had some increased SHOB, sats 90% on 3 liters, non productive cough, chest tightness more then usual, and some loss of taste and smell.  Patient denies fever, chills, or sweats. Patient stated he finished the prednisone prescribed, and that is what he associates his loss of taste and smell with.  Advised Patient that is a symptom of Covid. 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 Patient had a sick message 02/08/19- Beth,NP recommendations- Make sure he is wearing oxygen 24 hours a day. I would resume the BREO and use nebulizer twice a day. Will send in a small prednisone taper. Recommend nasal saline rinses twice daily (netti pot or ocean nasal spray) and flonase nasal spray daily in morning after saline rinse. Continue zyrtec and tylenol.   LOV 08/14/18 Beth,NP  Instructions  Albuterol nebulizer twice a day (sending in RX to pharmacy)  Make sure to wear oxygen 24/7 as much as possible   Mucinex twice a day for congestion  Continue delsym cough syrup    RX: Prednisone taper (40mg  x 2 days; 30mg  x 2 days; 20mg  x 2 days; 10mg  x 2 days) Augmentin 1 tab twice a day x 7 days     Message routed to Beth,NP to advise

## 2019-03-04 NOTE — Telephone Encounter (Signed)
Message routed to [facility]

## 2019-03-04 NOTE — Telephone Encounter (Signed)
Called and spoke with Patient.  Explained Aaron Edelman is finishing with Patients, and will be contacting him shortly.  Patient stated understanding.

## 2019-03-04 NOTE — Telephone Encounter (Signed)
Medication sent to pharmacy. Call made to patient to make aware. Voiced understanding. Nothing further is needed at this time.

## 2019-03-04 NOTE — Telephone Encounter (Signed)
Message routed to [facility] pool for covid testing per NP [facility]. Call made to patient made aware we would be sending in a order for a antibiotic and prednisone. Voiced understanding. Will await order from [place].

## 2019-03-04 NOTE — Telephone Encounter (Signed)
Prednisone 10mg  tablet  >>>4 tabs for 3 days, then 3 tabs for 3 days, 2 tabs for 3 days, then 1 tab for 3 days, then stop >>>take with food  >>>take in the morning   Doxycycline >>> 1 100 mg tablet every 12 hours for 7 days >>>take with food  >>>wear sunscreen    Place order   Wyn Quaker FNP

## 2019-03-05 ENCOUNTER — Other Ambulatory Visit: Payer: Self-pay

## 2019-03-05 ENCOUNTER — Telehealth: Payer: Self-pay | Admitting: *Deleted

## 2019-03-05 DIAGNOSIS — Z20822 Contact with and (suspected) exposure to covid-19: Secondary | ICD-10-CM

## 2019-03-05 NOTE — Telephone Encounter (Signed)
Pt. Called back - scheduled for today at [facility].

## 2019-03-05 NOTE — Telephone Encounter (Signed)
-----   Message from Vivia Ewing, LPN sent at 01/10/5464  5:10 PM EDT ----- Please schedule covid testing per NP Wyn Quaker. Thanks.

## 2019-03-05 NOTE — Telephone Encounter (Signed)
Attempted to contact pt to schedule COVID testing; left message on voice mail.

## 2019-03-05 NOTE — Addendum Note (Signed)
Addended by: Linus Orn A on: 03/05/2019 08:30 AM   Modules accepted: Orders

## 2019-03-10 ENCOUNTER — Telehealth: Payer: Self-pay | Admitting: Primary Care

## 2019-03-10 NOTE — Progress Notes (Signed)
Virtual Visit via Telephone Note  I connected with [name] on 03/11/19 at  9:00 AM EDT by telephone and verified that I am speaking with the correct person using two identifiers.  Location: Patient: Home Provider: Office   I discussed the limitations, risks, security and privacy concerns of performing an evaluation and management service by telephone and the availability of in person appointments. I also discussed with the patient that there may be a patient responsible charge related to this service. The patient expressed understanding and agreed to proceed.  Synopsis: Past medical history significant for pectus excavatum which has been repaired twice. The first was in childhood at age 69 the second was in 2011 as a redo procedure at [facility]. The redo surgery was performed because he thinks that this was because the chest wall was pressing on his heart. He has been followed by the pulmonary clinic for asthma as well as hypercarbic respiratory failure secondary to his chest wall issues. He also has a history significant for pulmonary hypertension which has been felt to be due to his chronic hypercapnic respiratory failure and hypoxemic respiratory failure as well as some degree of left atrial compression from his chest wall issues. Right heart catheterization performed in 2011 showed no elevation of pulmonary vascular resistance.  He started on BIPAP around 02/2009.    History of Present Illness: 38 year old male, former smoker quit 2008. PMH significant for pectus excavatum, restrictive lung disease, moderate persistent asthma, pulmonary hypertension, congestive heart failure, sinusitis, GERD. Patient of Dr. Lake Bells, last seen by pulmonary NP on 08/14/18. Maintained on Breo. Compliance with BIPAP mask.   Previous [place] encounter: 08/14/2018 Patient presents today for acute visit with complaints of cough and head congestion. Cough is somewhat productive. Associated sinus and head  congestion/pressure. Came in today to get ahead of his symptoms. Hasn't been treated with antibiotic course in over 3-4 months. Breathing is a little more labored than usual. He is wearing oxygen today in the office, he has been trying to wear it 24/7 like recommended. Has not started albuterol nebulizer, states friend bought him a machine and needs medication sent to pharmacy. States that he's been wearing his BIPAP everynight. Has repeat titration study Dec 19th.   03/11/2019 Patient presents today for 1 week follow-up visit. Called on 6/25 with reports increased shortness of breath, O2 saturation 90% 3L, NP cough, chest tightness. Tested for COVID and sent in prescription for prednisone taper and doxycycline. Advised to follow up in office in 5 days. Covid negative.   Doing a little better. Sob with exertion. Using albuterol neb once a day. Some nasal congestion. Breo causes thrush symptoms so he stopped taking it. Denies cough.   Observations/Objective:  - No shortness of breath, wheezing or cough - Audible nasal congestion   Assessment and Plan:  Moderate persistent asthma - Recent exacerbation treated with prednisone taper and doxycycline  - Increased shortness of breath on exertion - Not using BREO - Trial low dose SYMBICORT with spacer (rinse after use) - Albuterol hfa/neb q6 hours prn sob/wheezing   Thursh - Nystatin s/s prn   Allergic rhinitis - Continue Zyrtec - Add flonase/nasacort daily   Follow Up Instructions:   3-4 months with Dr. Lake Bells OR sooner if symptoms do not improve   I discussed the assessment and treatment plan with the patient. The patient was provided an opportunity to ask questions and all were answered. The patient agreed with the plan and demonstrated an understanding  of the instructions.   The patient was advised to call back or seek an in-person evaluation if the symptoms worsen or if the condition fails to improve as anticipated.  I provided 15  minutes of non-face-to-face time during this encounter.   Martyn Ehrich, NP

## 2019-03-10 NOTE — Telephone Encounter (Signed)
Called and spoke with Patient.  Patent is scheduled with Elizabeth,NP, 7/2 at 0900, telephone visit.  Covid testing is not back at this time.  Patient is aware he will be contacted with covid results once available.  Patient aware Benjamine Mola, NP will contact him around 0900 for telephone visit.  Understanding stated.  Nothing further at this time.

## 2019-03-11 ENCOUNTER — Ambulatory Visit (INDEPENDENT_AMBULATORY_CARE_PROVIDER_SITE_OTHER): Payer: Managed Care, Other (non HMO) | Admitting: Primary Care

## 2019-03-11 ENCOUNTER — Other Ambulatory Visit: Payer: Self-pay

## 2019-03-11 ENCOUNTER — Encounter: Payer: Self-pay | Admitting: Primary Care

## 2019-03-11 DIAGNOSIS — J9611 Chronic respiratory failure with hypoxia: Secondary | ICD-10-CM

## 2019-03-11 DIAGNOSIS — J9612 Chronic respiratory failure with hypercapnia: Secondary | ICD-10-CM | POA: Diagnosis not present

## 2019-03-11 LAB — NOVEL CORONAVIRUS, NAA: SARS-CoV-2, NAA: NOT DETECTED

## 2019-03-11 MED ORDER — NYSTATIN 100000 UNIT/ML MT SUSP: 5.0000 mL | Freq: Four times a day (QID) | OROMUCOSAL | 0 refills | Status: DC

## 2019-03-11 MED ORDER — POTASSIUM CHLORIDE CRYS ER 20 MEQ PO TBCR: EXTENDED_RELEASE_TABLET | ORAL | 0 refills | Status: DC

## 2019-03-11 MED ORDER — BUDESONIDE-FORMOTEROL FUMARATE 80-4.5 MCG/ACT IN AERO: 2.0000 | INHALATION_SPRAY | Freq: Two times a day (BID) | RESPIRATORY_TRACT | 0 refills | Status: DC

## 2019-03-11 NOTE — Patient Instructions (Addendum)
Covid negative  Recommendations: - Please resume daily inhaler Symbicort 80 - take two puffs twice daily (use with spacer) - Albuterol nebulizer every 4-6 hours for breakthrough shortness of breath  - Use flonase or nasacort for nasal congestion - Continue Zyrtec    RX: - Nystatin as needed for thrush symptoms  - Potassium refill x1  Follow-up: - 4 months with Dr. Lake Bells OR sooner if symptoms do not improve  - Follow up with cardiology

## 2019-03-12 ENCOUNTER — Other Ambulatory Visit: Payer: Self-pay | Admitting: Cardiology

## 2019-03-12 NOTE — Progress Notes (Signed)
Reviewed, agree 

## 2019-03-18 ENCOUNTER — Telehealth: Payer: Self-pay | Admitting: Primary Care

## 2019-03-18 MED ORDER — PREDNISONE 10 MG PO TABS: ORAL_TABLET | ORAL | 0 refills | Status: DC

## 2019-03-18 NOTE — Telephone Encounter (Signed)
Please advise patient to wear O2 24/7 as directed. Please make sure he is taking medications as advised by Beth: Moderate persistent asthma - SYMBICORT with spacer (rinse after use) 2 PUFFS TWICE DAILY - Albuterol hfa/neb q6 hours prn sob/wheezing   Allergic rhinitis - Continue Zyrtec - flonase/nasacort daily   I will send in order for prednisone. Please make follow up visit with Beth early next week. I f symptoms worsen over the weekend please go to the ED.

## 2019-03-18 NOTE — Telephone Encounter (Signed)
Primary Pulmonologist: BQ Last office visit and with whom: 03/11/2019 with Derl Barrow What do we see them for (pulmonary problems): pulm htn/ chron. resp fail Last OV assessment/plan: Instructions    Return in about 3 months (around 06/11/2019). Covid negative  Recommendations: - Please resume daily inhaler Symbicort 80 - take two puffs twice daily (use with spacer) - Albuterol nebulizer every 4-6 hours for breakthrough shortness of breath  - Use flonase or nasacort for nasal congestion - Continue Zyrtec    RX: - Nystatin as needed for thrush symptoms  - Potassium refill x1  Follow-up: - 4 months with Dr. Lake Bells OR sooner if symptoms do not improve  - Follow up with cardiology       Was appointment offered to patient (explain)?  Pt wants recommendations   Reason for call: called and spoke with pt who stated his breathing has become worse since the phone visit with Beth 7/2. Pt does wear his O2 at 3L.  Pt has used his neb about once daily.   Pt denies any complaints of cough, no wheezing. Pt stated he has been gasping for air more. Pt stated if he is with a customer and not in a car, he does not wear his O2. Pt stated that he has noticed more of a change with his breathing. Pt has a pulse ox but does not know where it is.  The entire time while speaking with pt on the phone, his story kept changing from him saying that he wears his O2 all the time to where he does not wear it when he is not in his car or with a customer.  Only thing that I could really get out of pt is that he believes his breathing is worse and wants to be seen for an appt. Pt had a negative COVID test 6/26. Tonya, please advise if you are okay if we scheduled pt an in office visit for him to be evaluated. Thanks!  Pt denied any other symptoms while I was speaking with him except his breathing has become worse and wants to be seen.

## 2019-03-18 NOTE — Telephone Encounter (Signed)
Called and spoke with pt letting him know that TN sent in Rx for prednisone to pharmacy for him and also stated to him that we needed to schedule visit for pt with Beth next week. Pt verbalized understanding. Pt has been scheduled appt with [facility] Monday, 7/13 at 9am. I also went over all the other instructions from TN in regards to what pt is to do with meds and O2. Pt verbalized understanding. Nothing further needed.

## 2019-03-22 ENCOUNTER — Telehealth (HOSPITAL_COMMUNITY): Payer: Self-pay | Admitting: Vascular Surgery

## 2019-03-22 ENCOUNTER — Other Ambulatory Visit: Payer: Self-pay

## 2019-03-22 ENCOUNTER — Other Ambulatory Visit: Payer: Self-pay | Admitting: *Deleted

## 2019-03-22 ENCOUNTER — Ambulatory Visit (INDEPENDENT_AMBULATORY_CARE_PROVIDER_SITE_OTHER): Payer: Managed Care, Other (non HMO)

## 2019-03-22 ENCOUNTER — Other Ambulatory Visit (HOSPITAL_COMMUNITY): Payer: Self-pay | Admitting: Cardiology

## 2019-03-22 ENCOUNTER — Encounter: Payer: Self-pay | Admitting: Primary Care

## 2019-03-22 ENCOUNTER — Other Ambulatory Visit: Payer: Self-pay | Admitting: Primary Care

## 2019-03-22 ENCOUNTER — Ambulatory Visit: Payer: Managed Care, Other (non HMO) | Admitting: Primary Care

## 2019-03-22 VITALS — BP 104/76 | HR 85 | Temp 98.0°F | Ht 76.0 in | Wt 214.2 lb

## 2019-03-22 DIAGNOSIS — I272 Pulmonary hypertension, unspecified: Secondary | ICD-10-CM

## 2019-03-22 DIAGNOSIS — J9612 Chronic respiratory failure with hypercapnia: Secondary | ICD-10-CM

## 2019-03-22 DIAGNOSIS — J4540 Moderate persistent asthma, uncomplicated: Secondary | ICD-10-CM

## 2019-03-22 DIAGNOSIS — R7989 Other specified abnormal findings of blood chemistry: Secondary | ICD-10-CM

## 2019-03-22 DIAGNOSIS — I5032 Chronic diastolic (congestive) heart failure: Secondary | ICD-10-CM | POA: Diagnosis not present

## 2019-03-22 DIAGNOSIS — R0600 Dyspnea, unspecified: Secondary | ICD-10-CM | POA: Diagnosis not present

## 2019-03-22 DIAGNOSIS — J9611 Chronic respiratory failure with hypoxia: Secondary | ICD-10-CM

## 2019-03-22 DIAGNOSIS — R0602 Shortness of breath: Secondary | ICD-10-CM | POA: Diagnosis not present

## 2019-03-22 DIAGNOSIS — I509 Heart failure, unspecified: Secondary | ICD-10-CM

## 2019-03-22 LAB — BASIC METABOLIC PANEL
BUN: 12 mg/dL (ref 6–23)
CO2: 51 mEq/L — ABNORMAL HIGH (ref 19–32)
Calcium: 8.9 mg/dL (ref 8.4–10.5)
Chloride: 89 mEq/L — ABNORMAL LOW (ref 96–112)
Creatinine, Ser: 0.85 mg/dL (ref 0.40–1.50)
GFR: 100.3 mL/min (ref >60.00)
Glucose, Bld: 115 mg/dL — ABNORMAL HIGH (ref 70–99)
Potassium: 5 mEq/L (ref 3.5–5.1)
Sodium: 143 mEq/L (ref 135–145)

## 2019-03-22 LAB — D-DIMER, QUANTITATIVE: D-Dimer, Quant: 0.51 mcg/mL FEU — ABNORMAL HIGH (ref <0.50)

## 2019-03-22 LAB — CBC WITH DIFFERENTIAL/PLATELET
Basophils Absolute: 0 10*3/uL (ref 0.0–0.1)
Basophils Relative: 0.1 % (ref 0.0–3.0)
Eosinophils Absolute: 0 10*3/uL (ref 0.0–0.7)
Eosinophils Relative: 0.3 % (ref 0.0–5.0)
HCT: 45.9 % (ref 39.0–52.0)
Hemoglobin: 14.9 g/dL (ref 13.0–17.0)
Lymphocytes Relative: 8.4 % — ABNORMAL LOW (ref 12.0–46.0)
Lymphs Abs: 0.8 10*3/uL (ref 0.7–4.0)
MCHC: 32.4 g/dL (ref 30.0–36.0)
MCV: 96.4 fl (ref 78.0–100.0)
Monocytes Absolute: 0.7 10*3/uL (ref 0.1–1.0)
Monocytes Relative: 7.6 % (ref 3.0–12.0)
Neutro Abs: 7.7 10*3/uL (ref 1.4–7.7)
Neutrophils Relative %: 83.6 % — ABNORMAL HIGH (ref 43.0–77.0)
Platelets: 145 10*3/uL — ABNORMAL LOW (ref 150.0–400.0)
RBC: 4.76 Mil/uL (ref 4.22–5.81)
RDW: 13.9 % (ref 11.5–15.5)
WBC: 9.2 10*3/uL (ref 4.0–10.5)

## 2019-03-22 LAB — BRAIN NATRIURETIC PEPTIDE: Pro B Natriuretic peptide (BNP): 62 pg/mL (ref 0.0–100.0)

## 2019-03-22 MED ORDER — AMOXICILLIN-POT CLAVULANATE 875-125 MG PO TABS: 1.0000 | ORAL_TABLET | Freq: Two times a day (BID) | ORAL | 0 refills | Status: DC

## 2019-03-22 NOTE — Patient Instructions (Addendum)
Please make sure to wearing oxygen 24/7  Continue trial Symbicort twice daily (every day no matter how you feel)  Use nebulizer every 6 hours as needed for breakthrough shortness of breath   Complete prednisone taper as directed   Continue lasix 40mg  in AM and 20mg  in afternoon  Labs and CXR today   ED if symptoms worsen

## 2019-03-22 NOTE — Assessment & Plan Note (Addendum)
-   O2 95% 3L pulsed today  - Compliance with oxygen is an issue, advised patient to wear O2 24/7 - CO2 51, check ONO on bipap with oxygen

## 2019-03-22 NOTE — Telephone Encounter (Signed)
Pt left message on scheduling line that he wants to get appt today , pt is having problems. He did not specify his problems on VM.Marland Kitchen Please advise

## 2019-03-22 NOTE — Progress Notes (Signed)
Pt aware order being placed.

## 2019-03-22 NOTE — Telephone Encounter (Signed)
Called pt to get more information. No answer/left VM. Pt was seen by pulmonary this morning for SOB.

## 2019-03-22 NOTE — Progress Notes (Signed)
Needs CTA r/o PE due to eleavated ddimer. Also needs ONO on BIPAP with 3L oxygen.

## 2019-03-22 NOTE — Progress Notes (Addendum)
@Patient  ID: [name], male    DOB: April 09, 1981, 38 y.o.   MRN: [id number]  Chief Complaint  Patient presents with   Shortness of Breath    No real improvement since last visit.     Referring provider: [name], PA-C  HPI: 38 year old male, former smoker quit 2008. PMH significant for pectus excavatum, restrictive lung disease, moderate persistent asthma, pulmonary hypertension, congestive heart failure, sinusitis, GERD. Patient of Dr. Lake Bells. Maintained on Breo. Compliance with BIPAP mask.   Previous [place] encounter: 08/14/2018 Patient presents today for acute visit with complaints of cough and head congestion. Cough is somewhat productive. Associated sinus and head congestion/pressure. Came in today to get ahead of his symptoms. Hasn't been treated with antibiotic course in over 3-4 months. Breathing is a little more labored than usual. He is wearing oxygen today in the office, he has been trying to wear it 24/7 like recommended. Has not started albuterol nebulizer, states friend bought him a machine and needs medication sent to pharmacy. States that he's been wearing his BIPAP everynight. Has repeat titration study Dec 19th.   03/11/2019 Patient presents today for 1 week follow-up visit. Called on 6/25 with reports increased shortness of breath, O2 saturation 90% 3L, NP cough, chest tightness. Tested for COVID and sent in prescription for prednisone taper and doxycycline. Advised to follow up in office in 5 days. Covid negative.   Doing a little better. Sob with exertion. Using albuterol neb once a day. Some nasal congestion. Breo causes thrush symptoms so he stopped taking it. Denies cough. Advised patient to try Symbicort.   03/22/2019 States that he felt the best he's felt in awhile during our most recent televisit on 7/2. Called office on 7/9 with increased sob. Given prednisone taper which has helped a little. Reports that he is still out of breath. Associated  sweats. Weight has gone up a little, feels he is holding onto fluid in abdomen. Still using bipap every night. Wears 3L oxygen while at home, does not like wearing in public or when meeting with clients. 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 Denies cough, sinus drainage, sore throat. Afebrile. Negative covid 6/26   Allergies  Allergen Reactions   Other Itching    Pet Dander    Immunization History  Administered Date(s) Administered   Influenza Split 06/16/2012   Influenza Whole 06/09/2010, 06/10/2011   Influenza,inj,Quad PF,6+ Mos 06/08/2013, 08/10/2015, 05/28/2018   Influenza-Unspecified 06/09/2014    Past Medical History:  Diagnosis Date   Asthma    CHF (congestive heart failure) (HCC)    Chronic hypercapnic respiratory failure (HCC)    nocturnal  hypoxemic resp failure, on bipap   Pectus excavatum    Restrictive lung disease     Tobacco History: Social History   Tobacco Use  Smoking Status Former Smoker   Packs/day: 0.10   Years: 1.00   Pack years: 0.10   Types: Cigarettes   Quit date: 09/09/2006   Years since quitting: 12.5  Smokeless Tobacco Never Used   Counseling given: Not Answered   Outpatient Medications Prior to Visit  Medication Sig Dispense Refill   albuterol (PROAIR HFA) 108 (90 BASE) MCG/ACT inhaler Inhale 2 puffs into the lungs every 6 (six) hours as needed for wheezing or shortness of breath. 8.5 each 0   albuterol (PROVENTIL) (2.5 MG/3ML) 0.083% nebulizer solution Take 3 mLs (2.5 mg total) by nebulization every 6 (six) hours as needed for wheezing or shortness of  breath. 75 mL 11   amphetamine-dextroamphetamine (ADDERALL) 10 MG tablet Take 10 mg by mouth 4 (four) times daily as needed.     budesonide-formoterol (SYMBICORT) 80-4.5 MCG/ACT inhaler Inhale 2 puffs into the lungs 2 (two) times a day. 2 Inhaler 0   cetirizine (ZYRTEC) 10 MG tablet Take 1 tablet (10 mg total) by mouth daily. 30 tablet 2   furosemide (LASIX) 40 MG  tablet TAKE 1 TAB IN AM AND 1/2 TAB IN PM 45 tablet 11   isosorbide mononitrate (IMDUR) 30 MG 24 hr tablet Take 1 tablet (30 mg total) by mouth daily. Need appt for future refills [id number] 30 tablet 0   nystatin (MYCOSTATIN) 100000 UNIT/ML suspension Take 5 mLs (500,000 Units total) by mouth 4 (four) times daily. As needed for thrush symptoms 60 mL 0   Omeprazole (PRILOSEC PO) Take 1 capsule by mouth 2 (two) times daily.      potassium chloride SA (KLOR-CON M20) 20 MEQ tablet TAKE 2 TABLETS BY MOUTH DAILY. NEED APPT WITH DR [facility] FOR FUTURE REFILLS [id number] 30 tablet 0   predniSONE (DELTASONE) 10 MG tablet Take 4 tabs for 2 days, then 3 tabs for 2 days, then 2 tabs for 2 days, then 1 tab for 2 days, then stop 20 tablet 0   bisoprolol (ZEBETA) 5 MG tablet TAKE 1 TABLET BY MOUTH EVERY DAY 90 tablet 0   No facility-administered medications prior to visit.    Review of Systems  Review of Systems  Constitutional: Positive for diaphoresis. Negative for fever.  HENT: Negative for congestion, postnasal drip and sinus pressure.   Respiratory: Positive for shortness of breath. Negative for cough and wheezing.   Cardiovascular: Negative for chest pain and leg swelling.  Gastrointestinal: Positive for abdominal distention. Negative for nausea and vomiting.   Physical Exam  BP 104/76 (BP Location: Left Arm, Patient Position: Sitting, Cuff Size: Normal)    Pulse 85    Temp 98 F (36.7 C)    Ht 6\' 4"  (1.93 m)    Wt 214 lb 3.2 oz (97.2 kg)    SpO2 95% Comment: 3L pulse   BMI 26.07 kg/m  Physical Exam Constitutional:      General: He is not in acute distress.    Appearance: He is not ill-appearing.  HENT:     Head: Normocephalic and atraumatic.     Mouth/Throat:     Mouth: Mucous membranes are moist.     Pharynx: Oropharynx is clear.  Pulmonary:     Effort: Accessory muscle usage present. No respiratory distress.     Breath sounds: Normal breath sounds.     Comments: LCS; O2 95% 3L  pulsed Musculoskeletal: Normal range of motion.  Skin:    General: Skin is warm.  Neurological:     General: No focal deficit present.     Mental Status: He is alert and oriented to person, place, and time.  Psychiatric:        Mood and Affect: Mood normal.        Behavior: Behavior normal.      Lab Results:  CBC    Component Value Date/Time   WBC 9.2 03/22/2019 1012   RBC 4.76 03/22/2019 1012   HGB 14.9 03/22/2019 1012   HCT 45.9 03/22/2019 1012   PLT 145.0 (L) 03/22/2019 1012   MCV 96.4 03/22/2019 1012   MCH 29.5 06/13/2010 0620   MCHC 32.4 03/22/2019 1012   RDW 13.9 03/22/2019 1012   LYMPHSABS 0.8 03/22/2019 1012  MONOABS 0.7 03/22/2019 1012   EOSABS 0.0 03/22/2019 1012   BASOSABS 0.0 03/22/2019 1012    BMET    Component Value Date/Time   NA 143 03/22/2019 1012   K 5.0 03/22/2019 1012   CL 89 (L) 03/22/2019 1012   CO2 51 (H) 03/22/2019 1012   GLUCOSE 115 (H) 03/22/2019 1012   BUN 12 03/22/2019 1012   CREATININE 0.85 03/22/2019 1012   CALCIUM 8.9 03/22/2019 1012   GFRNONAA >60 10/27/2015 1230   GFRAA >60 10/27/2015 1230    BNP    Component Value Date/Time   BNP 17.3 10/27/2015 1230    ProBNP    Component Value Date/Time   PROBNP 62.0 03/22/2019 1012    Imaging: Dg Chest 2 View  Result Date: 03/22/2019 CLINICAL DATA:  Shortness of breath EXAM: CHEST - 2 VIEW COMPARISON:  May 04, 2018 FINDINGS: There is extensive scarring throughout the left mid and lower lung zones. There is also hazy opacity in the right mid lung, stable findings. Heart is mildly enlarged with pulmonary vascularity normal. No adenopathy demonstrable. There is pectus excavatum. IMPRESSION: Extensive opacity in the left mid and lower lung zones as well as to a much lesser degree in the right mid lung, stable. Suspect widespread fibrosis/scarring. There could well be superimposed infiltrate in the left mid lower lung zones superimposed. Heart appears prominent with some exacerbation  in cardiac silhouette appearance due to pectus excavatum period no adenopathy demonstrable by radiography. Overall appearance is stable compared to prior study from 11 months prior. Electronically Signed   By: Lowella Grip III M.D.   On: 03/22/2019 10:22     Assessment & Plan:   Dyspnea - Increased sob x 1 week, no significant improvement with oral prednisone - Checking CXR and labs (bnp, ddimer, bmet, cbc) - CXR 03/22/2019 showed opacity left mid and lower lung. Suspect fibrosis/scarring but could be superimposed infiltrate. Sent in RX for Augmentin (needs recheck COVID) - Ddimer mildly elevated. Recommend CTA r/o PE.  - Advised patient to contact cardiology as well for additional work-up - ED if symptoms worsen   Moderate persistent asthma in adult without complication - No wheezing on exam - Continue Symbicort twice daily (with spacer); prn ablbuterol q6 hours - Continue prednisone taper until completed    Chronic diastolic CHF (congestive heart failure) - Reports weight increase and abd swelling - Checking BNP - Continue lasix 40mg  am; 20mg  pm until labs resulted  Chronic respiratory failure with hypoxia and hypercapnia (HCC) - O2 95% 3L pulsed today  - Compliance with oxygen is an issue, advised patient to wear O2 24/7 - CO2 51, check ONO on bipap with oxygen   Martyn Ehrich, NP 03/22/2019

## 2019-03-22 NOTE — Assessment & Plan Note (Addendum)
-   Increased sob x 1 week, no significant improvement with oral prednisone - Checking CXR and labs (bnp, ddimer, bmet, cbc) - CXR 03/22/2019 showed opacity left mid and lower lung. Suspect fibrosis/scarring but could be superimposed infiltrate. Sent in RX for Augmentin (needs recheck COVID) - Ddimer mildly elevated. Recommend CTA r/o PE.  - Advised patient to contact cardiology as well for additional work-up - ED if symptoms worsen

## 2019-03-22 NOTE — Addendum Note (Signed)
Addended by: Martyn Ehrich on: 03/22/2019 11:52 AM   Modules accepted: Orders

## 2019-03-22 NOTE — Addendum Note (Signed)
Addended by: Suzzanne Cloud E on: 03/22/2019 10:49 AM   Modules accepted: Orders

## 2019-03-22 NOTE — Assessment & Plan Note (Addendum)
-   No wheezing on exam - Continue Symbicort twice daily (with spacer); prn ablbuterol q6 hours - Continue prednisone taper until completed

## 2019-03-22 NOTE — Addendum Note (Signed)
Addended by: Annie Paras D on: 03/22/2019 10:48 AM   Modules accepted: Orders

## 2019-03-22 NOTE — Assessment & Plan Note (Signed)
-   Reports weight increase and abd swelling - Checking BNP - Continue lasix 40mg  am; 20mg  pm until labs resulted

## 2019-03-22 NOTE — Progress Notes (Signed)
CXR showed opacity left mid and lower lung. Suspect fibrosis/scarring but could be superimposed infiltrate. Will send in antibiotic. Continue steroid. Will call when labs resulted.

## 2019-03-23 NOTE — Progress Notes (Signed)
Reviewed, agree 

## 2019-03-24 ENCOUNTER — Telehealth: Payer: Self-pay | Admitting: Pulmonary Disease

## 2019-03-24 ENCOUNTER — Other Ambulatory Visit: Payer: Self-pay

## 2019-03-24 ENCOUNTER — Ambulatory Visit (HOSPITAL_BASED_OUTPATIENT_CLINIC_OR_DEPARTMENT_OTHER): Payer: Managed Care, Other (non HMO)

## 2019-03-24 ENCOUNTER — Encounter (HOSPITAL_BASED_OUTPATIENT_CLINIC_OR_DEPARTMENT_OTHER): Payer: Self-pay

## 2019-03-24 NOTE — Telephone Encounter (Signed)
Pt scheduled to have CTa performed today, 7/15 at 3:30. As soon as results are avail we can fax them to [facility] with [employer] at the provided fax number. Holding encounter open so we can follow up on it.

## 2019-03-24 NOTE — Telephone Encounter (Signed)
Call returned to patient, made aware of EW recommendations. Voiced understanding. He is okay with foregoing scan for now. He states he will call if anything changes. Nothing further is needed at this time.

## 2019-03-24 NOTE — Telephone Encounter (Signed)
Pt is calling back [phone]

## 2019-03-24 NOTE — Telephone Encounter (Signed)
Call returned to patient, he states he is trying to get his CTA done. He went over to his imaging location and they told him it would be $1000 upfront. Adrian Blackwater at [facility] it was $700. He states he does not have the money at this time. He states he does not want to die but he simply does not have the money. He reports he did not realize how much this scan would be and he is seeking advice. He reports his insurance told him [facility] would be the cheapest location but he still cannot afford the cost. He was unable to remember the cost for the [place] site.   Beth please advise. Thanks.

## 2019-03-24 NOTE — Telephone Encounter (Signed)
I am ok with canceling CTA. D-dimer was basically normal. Lets see if he gets better with recommendations, if not then would consider VQ scan

## 2019-03-26 NOTE — Telephone Encounter (Signed)
appt scheduled

## 2019-03-30 ENCOUNTER — Other Ambulatory Visit: Payer: Self-pay | Admitting: Primary Care

## 2019-03-31 NOTE — Telephone Encounter (Signed)
I will refill once but  NEED APPT WITH DR [facility] FOR FUTURE REFILLS [phone]

## 2019-03-31 NOTE — Telephone Encounter (Signed)
Is this appropriate for refill or refer to cardiology ?

## 2019-04-06 ENCOUNTER — Other Ambulatory Visit (HOSPITAL_COMMUNITY): Payer: Self-pay | Admitting: Cardiology

## 2019-04-06 DIAGNOSIS — I509 Heart failure, unspecified: Secondary | ICD-10-CM

## 2019-04-07 ENCOUNTER — Encounter (HOSPITAL_COMMUNITY): Payer: Self-pay | Admitting: Cardiology

## 2019-04-07 ENCOUNTER — Other Ambulatory Visit: Payer: Self-pay

## 2019-04-07 ENCOUNTER — Ambulatory Visit (HOSPITAL_COMMUNITY)
Admission: RE | Admit: 2019-04-07 | Discharge: 2019-04-07 | Disposition: A | Discharge status: Home / Self Care | Payer: Managed Care, Other (non HMO) | Source: Ambulatory Visit | Attending: Cardiology | Admitting: Cardiology

## 2019-04-07 ENCOUNTER — Other Ambulatory Visit (HOSPITAL_COMMUNITY): Payer: Self-pay

## 2019-04-07 VITALS — BP 110/80 | HR 88 | Wt 217.6 lb

## 2019-04-07 DIAGNOSIS — Z79899 Other long term (current) drug therapy: Secondary | ICD-10-CM | POA: Insufficient documentation

## 2019-04-07 DIAGNOSIS — J45901 Unspecified asthma with (acute) exacerbation: Secondary | ICD-10-CM | POA: Insufficient documentation

## 2019-04-07 DIAGNOSIS — Z7951 Long term (current) use of inhaled steroids: Secondary | ICD-10-CM | POA: Insufficient documentation

## 2019-04-07 DIAGNOSIS — I272 Pulmonary hypertension, unspecified: Secondary | ICD-10-CM

## 2019-04-07 DIAGNOSIS — J9612 Chronic respiratory failure with hypercapnia: Secondary | ICD-10-CM | POA: Diagnosis not present

## 2019-04-07 DIAGNOSIS — I509 Heart failure, unspecified: Secondary | ICD-10-CM | POA: Diagnosis not present

## 2019-04-07 DIAGNOSIS — Z87891 Personal history of nicotine dependence: Secondary | ICD-10-CM | POA: Insufficient documentation

## 2019-04-07 DIAGNOSIS — I5032 Chronic diastolic (congestive) heart failure: Secondary | ICD-10-CM

## 2019-04-07 DIAGNOSIS — Z8249 Family history of ischemic heart disease and other diseases of the circulatory system: Secondary | ICD-10-CM | POA: Insufficient documentation

## 2019-04-07 DIAGNOSIS — K7460 Unspecified cirrhosis of liver: Secondary | ICD-10-CM | POA: Insufficient documentation

## 2019-04-07 MED ORDER — SODIUM CHLORIDE 0.9% FLUSH: 3.0000 mL | Freq: Two times a day (BID) | INTRAVENOUS | Status: DC

## 2019-04-07 NOTE — Patient Instructions (Addendum)
TAKE Lasix 60mg  in the morning AND 40mg  in the evening for 4 days.   THEN resume normal dose of Lasix 40mg  twice a day  Labs in 10 days. We will only contact you if something comes back abnormal or we need to make some changes. Otherwise no news is good news!  You have been ordered for a cardiac MRI. You will be called to schedule this appointment.    [facility] [place] [phone] [place] [address] Dept: [phone] Loc: [phone]  ADRIANO BISCHOF  04/07/2019  You are scheduled for a Cardiac Catheterization on Wednesday, August 12th, 2020 with Dr. Loralie Champagne.  1. Please arrive at the [facility] (Main Entrance A) at [facility]: [address] [place], [place] [address] at 6:30 AM (This time is two hours before your procedure to ensure your preparation). Free valet parking service is available.   Special note: Every effort is made to have your procedure done on time. Please understand that emergencies sometimes delay scheduled procedures.  2. Diet: Do not eat solid foods after midnight.  The patient may have clear liquids until 5am upon the day of the procedure.  3. Labs: Monday, August 10th, 2020 1:45p COVID screen: August 10th, 2020 2:40p.  This will be done at [facility] drive up tent.  After test, you have to remain quarantined until the day of your procedure.   4. Medication instructions in preparation for your procedure:  HOLD Lasix on the morning of your procedure.   Contrast Allergy: No  You can take your morning medicines. You may use sips of water.  5. Plan for one night stay--bring personal belongings. 6. Bring a current list of your medications and current insurance cards. 7. You MUST have a responsible person to drive you home. 8. Someone MUST be with you the first 24 hours after you arrive home or your discharge will be delayed. 9. Please wear clothes  that are easy to get on and off and wear slip-on shoes.  Thank you for allowing [place] to care for you!   -- [facility]

## 2019-04-07 NOTE — Progress Notes (Signed)
Patient ID: [name], male   DOB: [birth year], 38 y.o.   MRN: [id number] PCP: Dr. Tollie Pizza Cardiology: Dr. Aundra Dubin  38 y.o. with history of pectus excavatum s/p repair in childhood and redo pectus repair in 6/11 returns to cardiology clinic for followup of RV failure. He has chronic hypercarbic and and nocturnal hypoxemic respiratory failure.  He has been wearing Bipap at night.  Oxygen saturation drops with heavy exercise, so he uses oxygen when he will be exercising.  Last echo was in 5/16 and showed normal LV EF but did not show the RV well.  I had him get a cardiac MRI for better view of the RV.  This showed moderate to severe RV dilation with mild to moderate RV systolic dysfunction, RV EF 37%.  This looked very similar to the 2010 MRI.   I have not seen him since 2018.  He was doing well until a couple of months ago.  Since then, he has developed progressive exertional dyspnea.  COVID-19 test was negative.  He is short of breath walking from his house to the car, short of breath with stairs/inclines.  He has atypical rare chest pain.  He has chronic orthopnea and does sleep propped up.  He has had 2 courses of steroids and antibiotics with no relief (from pulmonary clinic). D dimer was very mildly elevated, PE thought to be unlikely.  He continues to use 3 L oxygen during the day and Bipap at night.   Labs (8/10): BNP 9, K 3.6, creatinine 0.7  Labs (2/14): K 3.8, creatinine 0.8, BNP not elevated Labs (2/16): K 3.4, creatinine 0.81 Labs (6/16): K 4, creatinine 0.73, BNP 12 Labs (11/18): K 4.1, creatinine 0.82, pro-BNP 6, hgb 15.2 Labs (7/20): pro-BNP 62, K 5, creatinine 0.85  ECG (personally reviewed): NSR, PACs, iRBBB, left axis deviation   Allergies (verified):  No Known Drug Allergies   Past Medical History:  1. Pectus excavatum status post repair in [place] in childhood and repeat repair at [facility] in 6/11. Borderline Marfanoid habitus.  2. Asthma.  3. CHF: Patient was admitted in  6/10 with CHF and volume overload. He was diuresed with IV lasix and felt better. Echo suggested that the RV was enlarged. RHC was done (after several doses of IV lasix) showing mean RA 13, RV 38/12, PA 42/23 mean 34, mean PCWP 15. PVR < 2 WU. Shunt run showed no O2 step up. Patient was hypoxic and was started O2. Cardiac MRI (6/10) showed normal LV size and systolic function EF 97%, D-shaped IV septum, moderate to severe RV dilation with RV EF 37%. The pulmonic valve opened normally. The heart was shifted leftward (pectus) with compression of the LA between the ascending and descending aorta. MRA of the chest showed normal pulmonary veins. CHF was primarily right-sided with mild pulmonary HTN noted. He is hypoxic, especially at night, so hypoxic pulmonary vasoconstriction could be playing a role. PCWP was only mildly elevated but I wonder if LA compression could be playing a role. Repeat echo after pectus surgery in 11/11 showed normal LV systolic function and size with a D-shaped interventricular septum. The LA was still compressed between ascending and descending aorta. The RV was moderately dilated with mild systolic dysfunction.  Echo (5/16) with EF 5-60%, aortic root 4.0 cm, RV poorly vsiualized.  Cardiac MRI (6/16) with pectus excavatum, left-shifted heart, LA compressed between ascending and descending aorta, LV EF 58% with D-shaped interventricular septum suggestive of RV pressure/volume overload, moderate to severe RV  dilation with mild to moderate RV systolic dysfunction (RV EF 37%), dilated PA (this MRI is very similar to prior in 2010).  4. 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  - High resolution CT chest (8/19): Bilateral pleuroparenchymal scarring, cirrhosis noted. No ILD.  5. 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 Patient is on Bipap.   Family History:  POS for HTN-Mother   Social History:  The patient lives in  [place] with his wife and son.  He works full time in Manufacturing engineer He drinks 1-6 drinks per week, never having more than 3 drinks in 1 evening. No illicit drug use.  Patient states former smoker. Started at age 85. only smoked socially. Quit smoking at age 54.   Review of Systems  All systems reviewed and negative except as per HPI.  Current Outpatient Medications  Medication Sig Dispense Refill  . albuterol (PROAIR HFA) 108 (90 BASE) MCG/ACT inhaler Inhale 2 puffs into the lungs every 6 (six) hours as needed for wheezing or shortness of breath. 8.5 each 0  . albuterol (PROVENTIL) (2.5 MG/3ML) 0.083% nebulizer solution Take 3 mLs (2.5 mg total) by nebulization every 6 (six) hours as needed for wheezing or shortness of breath. 75 mL 11  . amphetamine-dextroamphetamine (ADDERALL) 10 MG tablet Take 10 mg by mouth 4 (four) times daily as needed.    . bisoprolol (ZEBETA) 5 MG tablet TAKE 1 TABLET BY MOUTH EVERY DAY 90 tablet 0  . budesonide-formoterol (SYMBICORT) 80-4.5 MCG/ACT inhaler Inhale 2 puffs into the lungs 2 (two) times a day. 2 Inhaler 0  . cetirizine (ZYRTEC) 10 MG tablet Take 1 tablet (10 mg total) by mouth daily. 30 tablet 2  . furosemide (LASIX) 40 MG tablet TAKE 1 TAB IN AM AND 1/2 TAB IN PM 45 tablet 0  . isosorbide mononitrate (IMDUR) 30 MG 24 hr tablet TAKE 1 TABLET (30 MG TOTAL) BY MOUTH DAILY. NEED APPT FOR FUTURE REFILLS [phone] 30 tablet 0  . nystatin (MYCOSTATIN) 100000 UNIT/ML suspension Take 5 mLs (500,000 Units total) by mouth 4 (four) times daily. As needed for thrush symptoms 60 mL 0  . Omeprazole (PRILOSEC PO) Take 1 capsule by mouth 2 (two) times daily.     . potassium chloride SA (KLOR-CON M20) 20 MEQ tablet TAKE 2 TABLETS BY MOUTH DAILY. NEED APPT WITH DR [facility] FOR FUTURE REFILLS [phone] 30 tablet 0   No current facility-administered medications for this encounter.     BP 110/80   Pulse 88   Wt 98.7 kg (217 lb 9.6 oz)   SpO2 97%   BMI 26.49  kg/m  General: NAD, pectus excavatum Neck: JVP 9-10 cm, no thyromegaly or thyroid nodule.  Lungs: Clear to auscultation bilaterally with normal respiratory effort. CV: Nondisplaced PMI.  Heart regular S1/S2, no S3/S4, no murmur.  No peripheral edema.  No carotid bruit.  Normal pedal pulses.  Abdomen: Soft, nontender, no hepatosplenomegaly, no distention.  Skin: Intact without lesions or rashes.  Neurologic: Alert and oriented x 3.  Psych: Normal affect. Extremities: No clubbing or cyanosis.  HEENT: Normal.   Assessment/Plan: 1. Chronic diastolic CHF with RV failure:  Patient presented with primarily right-sided CHF in 2010, likely due to pulmonary hypertension from a combination of left atrial compression and nocturnal hypoxemia. He is now using Bipap every night. He had a redo pectus surgery in 2011. I suspect the surgery relieved some of the compression on his lungs but followup cardiac MRI  in 6/16 did show continued compression of the left atrium between the ascending and descending aorta as well as a dilated RV with RV EF 37%.  Cardiac MRI in 6/16 was very similar to the MRI in 2010. Recently, he has had increased exertional dyspnea.  Treatment for asthma exacerbation with steroids and antibiotics x 2 courses has not helped much.   On exam, he does look volume overloaded though pro-BNP done recently was not particularly high.  - Continue Bipap at night.  - Increase Lasix to 60 qam/40 qpm x 4 days then 40 mg bid after that.  BMET in 10 days.     - I think that it is time to repeat RHC.  We discussed risks/benefits and he agrees to proceed.    - I am going to repeat cardiac MRI to evaluate RV (echo has had poor windows in this patient).  2. 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   - Cardiac MRI  to look at RV.  - RHC as above.  3. Restrictive lung disease:  Lung disease from restriction due to pectus. He has had chronic hypercarbic respiratory failure.  This is a major player in his chronic dyspnea. He has had 2 operations already for his pectus deformity. 4. Asthma: Chronic.    Followup in 1 month.    Loralie Champagne 04/07/2019

## 2019-04-08 ENCOUNTER — Telehealth (HOSPITAL_COMMUNITY): Payer: Self-pay

## 2019-04-08 NOTE — Telephone Encounter (Signed)
Disability parking forms signed and mailed to patient.

## 2019-04-13 ENCOUNTER — Other Ambulatory Visit: Payer: Self-pay | Admitting: Primary Care

## 2019-04-17 ENCOUNTER — Telehealth: Payer: Self-pay | Admitting: Student

## 2019-04-17 NOTE — Telephone Encounter (Signed)
   Received call from patient's wife. That patient started having severe headache yesterday with vomiting and intermittent confusion. Wife states sometimes what he says does not make sense. Currently is not confused. Wife was concerned that he may be dehydrated due to recent increase in Lasix but states he has not had as much urinary output the last couple of days that she would expect with Lasix. Also reported low sats overnight on BiPAP and O2. Advised patient to go to the ED for further evaluation. Patient in [place], [place] right now. Advised them to go to the closest ED. Wife voice understanding and agreed.  Darreld Mclean, PA-C 04/17/2019 8:33 AM

## 2019-04-19 ENCOUNTER — Inpatient Hospital Stay (HOSPITAL_COMMUNITY): Admission: RE | Admit: 2019-04-19 | Payer: Managed Care, Other (non HMO) | Source: Ambulatory Visit

## 2019-04-19 ENCOUNTER — Telehealth: Payer: Self-pay | Admitting: Primary Care

## 2019-04-19 ENCOUNTER — Other Ambulatory Visit (HOSPITAL_COMMUNITY): Payer: Managed Care, Other (non HMO)

## 2019-04-19 NOTE — Telephone Encounter (Signed)
Received phone call from Dr. Truman Hayward from outside hospital. Patient was at the beach and became short of breath on Friday 8/7. Presented to [facility] ED and PCO2 was 137, intubated and transferred to the ICU. He is scheduled for a planned right heart cath at [facility] cone with Dr. Aundra Dubin. Patient was extubated today and looks well but PCO2 back up in 90. They are considering intubating him for transfer to [place]. Discussed with Dr. Lake Bells briefly by phone who knows patient. Patient is non-compliant with oxygen. He felt liek he should be fine for transfer but that that is up to sending doctor. Was told to give Dr. Truman Hayward 667 number and they can contact Dr. Lake Bells if they have further questions. Also given carelink number for potential transfer.

## 2019-04-20 ENCOUNTER — Telehealth (HOSPITAL_COMMUNITY): Payer: Self-pay | Admitting: *Deleted

## 2019-04-20 ENCOUNTER — Inpatient Hospital Stay (HOSPITAL_COMMUNITY)
Admission: AD | Admit: 2019-04-20 | Discharge: 2019-04-27 | DRG: 286 | Disposition: A | Discharge status: Home / Self Care | Payer: Managed Care, Other (non HMO) | Source: Other Acute Inpatient Hospital | Attending: Internal Medicine | Admitting: Internal Medicine

## 2019-04-20 ENCOUNTER — Other Ambulatory Visit: Payer: Self-pay

## 2019-04-20 ENCOUNTER — Encounter (HOSPITAL_COMMUNITY): Payer: Self-pay | Admitting: Family Medicine

## 2019-04-20 DIAGNOSIS — D649 Anemia, unspecified: Secondary | ICD-10-CM | POA: Diagnosis present

## 2019-04-20 DIAGNOSIS — I50810 Right heart failure, unspecified: Secondary | ICD-10-CM | POA: Diagnosis present

## 2019-04-20 DIAGNOSIS — J9611 Chronic respiratory failure with hypoxia: Secondary | ICD-10-CM | POA: Diagnosis not present

## 2019-04-20 DIAGNOSIS — K7460 Unspecified cirrhosis of liver: Secondary | ICD-10-CM | POA: Diagnosis present

## 2019-04-20 DIAGNOSIS — Z8249 Family history of ischemic heart disease and other diseases of the circulatory system: Secondary | ICD-10-CM

## 2019-04-20 DIAGNOSIS — E876 Hypokalemia: Secondary | ICD-10-CM | POA: Diagnosis present

## 2019-04-20 DIAGNOSIS — I509 Heart failure, unspecified: Secondary | ICD-10-CM

## 2019-04-20 DIAGNOSIS — J984 Other disorders of lung: Secondary | ICD-10-CM

## 2019-04-20 DIAGNOSIS — I5082 Biventricular heart failure: Secondary | ICD-10-CM | POA: Diagnosis present

## 2019-04-20 DIAGNOSIS — Q676 Pectus excavatum: Secondary | ICD-10-CM

## 2019-04-20 DIAGNOSIS — Z9981 Dependence on supplemental oxygen: Secondary | ICD-10-CM

## 2019-04-20 DIAGNOSIS — I50812 Chronic right heart failure: Secondary | ICD-10-CM | POA: Diagnosis not present

## 2019-04-20 DIAGNOSIS — J9819 Other pulmonary collapse: Secondary | ICD-10-CM

## 2019-04-20 DIAGNOSIS — G4733 Obstructive sleep apnea (adult) (pediatric): Secondary | ICD-10-CM | POA: Diagnosis present

## 2019-04-20 DIAGNOSIS — J9601 Acute respiratory failure with hypoxia: Secondary | ICD-10-CM | POA: Diagnosis present

## 2019-04-20 DIAGNOSIS — Z7951 Long term (current) use of inhaled steroids: Secondary | ICD-10-CM

## 2019-04-20 DIAGNOSIS — I5033 Acute on chronic diastolic (congestive) heart failure: Secondary | ICD-10-CM | POA: Diagnosis present

## 2019-04-20 DIAGNOSIS — J9622 Acute and chronic respiratory failure with hypercapnia: Secondary | ICD-10-CM | POA: Diagnosis present

## 2019-04-20 DIAGNOSIS — D696 Thrombocytopenia, unspecified: Secondary | ICD-10-CM | POA: Diagnosis present

## 2019-04-20 DIAGNOSIS — Z79899 Other long term (current) drug therapy: Secondary | ICD-10-CM

## 2019-04-20 DIAGNOSIS — R0602 Shortness of breath: Secondary | ICD-10-CM

## 2019-04-20 DIAGNOSIS — R739 Hyperglycemia, unspecified: Secondary | ICD-10-CM | POA: Diagnosis present

## 2019-04-20 DIAGNOSIS — I272 Pulmonary hypertension, unspecified: Secondary | ICD-10-CM | POA: Diagnosis present

## 2019-04-20 DIAGNOSIS — K219 Gastro-esophageal reflux disease without esophagitis: Secondary | ICD-10-CM | POA: Diagnosis present

## 2019-04-20 DIAGNOSIS — Z87891 Personal history of nicotine dependence: Secondary | ICD-10-CM

## 2019-04-20 DIAGNOSIS — J9612 Chronic respiratory failure with hypercapnia: Secondary | ICD-10-CM | POA: Diagnosis present

## 2019-04-20 DIAGNOSIS — I5032 Chronic diastolic (congestive) heart failure: Secondary | ICD-10-CM | POA: Diagnosis present

## 2019-04-20 DIAGNOSIS — I2722 Pulmonary hypertension due to left heart disease: Principal | ICD-10-CM | POA: Diagnosis present

## 2019-04-20 DIAGNOSIS — E874 Mixed disorder of acid-base balance: Secondary | ICD-10-CM | POA: Diagnosis present

## 2019-04-20 DIAGNOSIS — J4540 Moderate persistent asthma, uncomplicated: Secondary | ICD-10-CM | POA: Diagnosis present

## 2019-04-20 MED ORDER — MAGNESIUM SULFATE 2 GM/50ML IV SOLN
2.00 | INTRAVENOUS | Status: DC
Start: ? — End: 2019-04-20

## 2019-04-20 MED ORDER — SODIUM CHLORIDE 0.9% FLUSH
3.0000 mL | Freq: Two times a day (BID) | INTRAVENOUS | Status: DC
  Administered 2019-04-20 – 2019-04-27 (×9): 3 mL via INTRAVENOUS

## 2019-04-20 MED ORDER — POLYETHYLENE GLYCOL 3350 17 G PO PACK
17.00 | PACK | ORAL | Status: DC
Start: ? — End: 2019-04-20

## 2019-04-20 MED ORDER — LACTULOSE 10 GM/15ML PO SOLN
20.00 | ORAL | Status: DC
Start: ? — End: 2019-04-20

## 2019-04-20 MED ORDER — FUROSEMIDE 10 MG/ML IJ SOLN
20.00 | INTRAMUSCULAR | Status: DC
Start: 2019-04-21 — End: 2019-04-20

## 2019-04-20 MED ORDER — SODIUM CHLORIDE 0.9% FLUSH: 3.0000 mL | INTRAVENOUS | Status: DC | PRN

## 2019-04-20 MED ORDER — MOMETASONE FURO-FORMOTEROL FUM 100-5 MCG/ACT IN AERO
2.0000 | INHALATION_SPRAY | Freq: Two times a day (BID) | RESPIRATORY_TRACT | Status: DC
  Administered 2019-04-20 – 2019-04-27 (×14): 2 via RESPIRATORY_TRACT
  Filled 2019-04-20: qty 8.8

## 2019-04-20 MED ORDER — SIMETHICONE 80 MG PO CHEW
80.0000 mg | CHEWABLE_TABLET | Freq: Every day | ORAL | Status: DC
  Administered 2019-04-21 – 2019-04-27 (×7): 80 mg via ORAL
  Filled 2019-04-20 (×7): qty 1

## 2019-04-20 MED ORDER — BISOPROLOL FUMARATE 5 MG PO TABS
5.0000 mg | ORAL_TABLET | Freq: Every day | ORAL | Status: DC
  Administered 2019-04-21 – 2019-04-27 (×7): 5 mg via ORAL
  Filled 2019-04-20 (×7): qty 1

## 2019-04-20 MED ORDER — ONDANSETRON HCL 4 MG PO TABS: 4.0000 mg | ORAL_TABLET | Freq: Four times a day (QID) | ORAL | Status: DC | PRN

## 2019-04-20 MED ORDER — SODIUM CHLORIDE 0.9% FLUSH
3.0000 mL | Freq: Two times a day (BID) | INTRAVENOUS | Status: DC
  Administered 2019-04-20 – 2019-04-24 (×3): 3 mL via INTRAVENOUS

## 2019-04-20 MED ORDER — GENERIC EXTERNAL MEDICATION
Status: DC
Start: ? — End: 2019-04-20

## 2019-04-20 MED ORDER — ENOXAPARIN SODIUM 40 MG/0.4ML ~~LOC~~ SOLN
40.0000 mg | SUBCUTANEOUS | Status: DC
  Administered 2019-04-20 – 2019-04-26 (×7): 40 mg via SUBCUTANEOUS
  Filled 2019-04-20 (×7): qty 0.4

## 2019-04-20 MED ORDER — CEFTRIAXONE SODIUM-DEXTROSE 1-3.74 GM-%(50ML) IV SOLR
1.00 | INTRAVENOUS | Status: DC
Start: 2019-04-21 — End: 2019-04-20

## 2019-04-20 MED ORDER — POTASSIUM CHLORIDE CRYS ER 20 MEQ PO TBCR
40.0000 meq | EXTENDED_RELEASE_TABLET | Freq: Every day | ORAL | Status: DC
  Administered 2019-04-21: 40 meq via ORAL
  Filled 2019-04-20: qty 2

## 2019-04-20 MED ORDER — PANTOPRAZOLE SODIUM 40 MG PO TBEC
40.0000 mg | DELAYED_RELEASE_TABLET | Freq: Two times a day (BID) | ORAL | Status: DC
  Administered 2019-04-20 – 2019-04-27 (×14): 40 mg via ORAL
  Filled 2019-04-20 (×15): qty 1

## 2019-04-20 MED ORDER — GENERIC EXTERNAL MEDICATION
25.00 | Status: DC
Start: ? — End: 2019-04-20

## 2019-04-20 MED ORDER — ACETAMINOPHEN 650 MG/20.3ML PO SOLN
650.00 | ORAL | Status: DC
Start: ? — End: 2019-04-20

## 2019-04-20 MED ORDER — ALBUTEROL SULFATE 2.5 MG/0.5ML IN NEBU
2.50 | INHALATION_SOLUTION | RESPIRATORY_TRACT | Status: DC
Start: ? — End: 2019-04-20

## 2019-04-20 MED ORDER — PANTOPRAZOLE SODIUM 40 MG PO TBEC
40.00 | DELAYED_RELEASE_TABLET | ORAL | Status: DC
Start: 2019-04-21 — End: 2019-04-20

## 2019-04-20 MED ORDER — MELATONIN 3 MG PO TABS
3.00 | ORAL_TABLET | ORAL | Status: DC
Start: 2019-04-20 — End: 2019-04-20

## 2019-04-20 MED ORDER — MAGNESIUM HYDROXIDE 2400 MG/10ML PO SUSP
10.00 | ORAL | Status: DC
Start: ? — End: 2019-04-20

## 2019-04-20 MED ORDER — PROPOFOL 100 MG/10ML IV EMUL
5.00 | INTRAVENOUS | Status: DC
Start: ? — End: 2019-04-20

## 2019-04-20 MED ORDER — ONDANSETRON HCL 4 MG/2ML IJ SOLN: 4.0000 mg | Freq: Four times a day (QID) | INTRAMUSCULAR | Status: DC | PRN

## 2019-04-20 MED ORDER — ISOSORBIDE MONONITRATE ER 30 MG PO TB24
30.0000 mg | ORAL_TABLET | Freq: Every day | ORAL | Status: DC
  Administered 2019-04-21 – 2019-04-27 (×7): 30 mg via ORAL
  Filled 2019-04-20 (×7): qty 1

## 2019-04-20 MED ORDER — HYDROCODONE-ACETAMINOPHEN 5-325 MG PO TABS
1.0000 | ORAL_TABLET | ORAL | Status: DC | PRN
  Administered 2019-04-20: 1 via ORAL
  Administered 2019-04-24 (×2): 2 via ORAL
  Administered 2019-04-25: 1 via ORAL
  Administered 2019-04-25 – 2019-04-27 (×6): 2 via ORAL
  Filled 2019-04-20 (×6): qty 2
  Filled 2019-04-20: qty 1
  Filled 2019-04-20 (×2): qty 2
  Filled 2019-04-20: qty 1

## 2019-04-20 MED ORDER — FUROSEMIDE 40 MG PO TABS
40.0000 mg | ORAL_TABLET | Freq: Two times a day (BID) | ORAL | Status: DC
  Administered 2019-04-21: 40 mg via ORAL
  Filled 2019-04-20: qty 1

## 2019-04-20 MED ORDER — BISACODYL 10 MG RE SUPP
10.00 | RECTAL | Status: DC
Start: ? — End: 2019-04-20

## 2019-04-20 MED ORDER — SENNOSIDES-DOCUSATE SODIUM 8.6-50 MG PO TABS
1.00 | ORAL_TABLET | ORAL | Status: DC
Start: ? — End: 2019-04-20

## 2019-04-20 MED ORDER — ISOSORBIDE MONONITRATE ER 30 MG PO TB24
30.00 | ORAL_TABLET | ORAL | Status: DC
Start: 2019-04-21 — End: 2019-04-20

## 2019-04-20 MED ORDER — POLYETHYLENE GLYCOL 3350 17 G PO PACK: 17.0000 g | PACK | Freq: Every day | ORAL | Status: DC | PRN

## 2019-04-20 MED ORDER — ENOXAPARIN SODIUM 40 MG/0.4ML ~~LOC~~ SOLN
40.00 | SUBCUTANEOUS | Status: DC
Start: 2019-04-21 — End: 2019-04-20

## 2019-04-20 MED ORDER — SODIUM CHLORIDE 0.9 % IV SOLN: 250.0000 mL | INTRAVENOUS | Status: DC | PRN

## 2019-04-20 MED ORDER — BISOPROLOL FUMARATE 5 MG PO TABS
5.00 | ORAL_TABLET | ORAL | Status: DC
Start: 2019-04-21 — End: 2019-04-20

## 2019-04-20 MED ORDER — ALBUTEROL SULFATE (2.5 MG/3ML) 0.083% IN NEBU: 3.0000 mL | INHALATION_SOLUTION | Freq: Four times a day (QID) | RESPIRATORY_TRACT | Status: DC | PRN

## 2019-04-20 MED ORDER — ACETAMINOPHEN 325 MG PO TABS: 650.0000 mg | ORAL_TABLET | Freq: Four times a day (QID) | ORAL | Status: DC | PRN

## 2019-04-20 MED ORDER — GENERIC EXTERNAL MEDICATION
500.00 | Status: DC
Start: 2019-04-21 — End: 2019-04-20

## 2019-04-20 MED ORDER — ACETAMINOPHEN 650 MG RE SUPP: 650.0000 mg | Freq: Four times a day (QID) | RECTAL | Status: DC | PRN

## 2019-04-20 NOTE — Telephone Encounter (Signed)
Pt is sch for RHC on 8/12 with Dr Aundra Dubin and was supposed to go for his Covid-19 testing yesterday (8/10) however he did not go for this and he is not able to have [place] until it is done and resulted.  Attempted to contact pt to discuss this and resch RHC however he did not answer, left VM stating he needs to c/b today.

## 2019-04-20 NOTE — H&P (Signed)
History and Physical    [name]:[id number] DOB: 03-04-1981 DOA: 04/20/2019  PCP: [name], PA-C   Patient coming from: Home, by way of [place]  Chief Complaint: SOB   HPI: [name] is a 38 y.o. male with medical history significant for pectus excavating status post repair in childhood and redo in 2011, chronic right-sided heart failure, chronic diastolic heart failure, severe pulmonary hypertension, chronic hypoxic and hypercarbic respiratory failure, reported progressively worsening dyspnea over the past several months, became acutely short of breath with nausea and headache while on a trip to the beach on 04/16/2019, went to the ED, was found to be in acute on chronic respiratory failure with pH 7.18 and pCO2 148, was intubated, and admitted.  [facility] Hospital Course: Upon arrival to the ED, patient is found to be in acute respiratory distress with hypoxic and hypercarbic respiratory failure with pH 7.18 and pCO2 148.  Chest x-ray was suggestive of edema and right lower lobe infiltrate.  D-dimer was elevated and CTA negative for PE though concerning for early interstitial edema and RLL infection or aspiration. Patient was diuresed with IV Lasix, treated with Rocephin and azithromycin, and successfully extubated to BiPAP on 04/19/2019.  He remained stable and was down to his usual 4 L/min of supplemental oxygen on 04/20/2019.  He had been scheduled for elective right heart catheterization for 04/21/2019 at [facility], treatment team at the outside hospital discussed with pulmonology here at [facility] and it was recommended the patient be transferred to the medical service.  Review of Systems:  All other systems reviewed and apart from HPI, are negative.  Past Medical History:  Diagnosis Date  . Asthma   . CHF (congestive heart failure) ([place])   . Chronic hypercapnic respiratory failure (HCC)    nocturnal  hypoxemic resp failure, on bipap  . Pectus  excavatum   . Restrictive lung disease     Past Surgical History:  Procedure Laterality Date  . KNEE SURGERY     right  . PECTUS EXCAVATUM REPAIR       reports that he quit smoking about 12 years ago. His smoking use included cigarettes. He has a 0.10 pack-year smoking history. He has never used smokeless tobacco. He reports that he does not drink alcohol or use drugs.  Allergies  Allergen Reactions  . Other Itching    Pet Dander    Family History  Problem Relation Age of Onset  . Hypertension Mother   . Heart attack Neg Hx   . Stroke Neg Hx      Prior to Admission medications   Medication Sig Start Date End Date Taking? Authorizing Provider  albuterol (PROAIR HFA) 108 (90 BASE) MCG/ACT inhaler Inhale 2 puffs into the lungs every 6 (six) hours as needed for wheezing or shortness of breath. 04/29/14   Clance, Armando Reichert, MD  albuterol (PROVENTIL) (2.5 MG/3ML) 0.083% nebulizer solution Take 3 mLs (2.5 mg total) by nebulization every 6 (six) hours as needed for wheezing or shortness of breath. 08/14/18   Martyn Ehrich, NP  amphetamine-dextroamphetamine (ADDERALL) 10 MG tablet Take 10 mg by mouth 4 (four) times daily as needed.    [provider]  bisoprolol (ZEBETA) 5 MG tablet TAKE 1 TABLET BY MOUTH EVERY DAY 03/22/19   Larey Dresser, MD  budesonide-formoterol [facility]) 80-4.5 MCG/ACT inhaler Inhale 2 puffs into the lungs 2 (two) times a day. 03/11/19   Martyn Ehrich, NP  cetirizine (ZYRTEC) 10 MG tablet Take 1 tablet (10 mg total) by mouth daily. 08/18/12   Clance, Armando Reichert, MD  furosemide (LASIX) 40 MG tablet TAKE 1 TAB IN AM AND 1/2 TAB IN PM Patient taking differently: Take 40 mg by mouth 2 (two) times daily.  04/06/19   Larey Dresser, MD  isosorbide mononitrate (IMDUR) 30 MG 24 hr tablet TAKE 1 TABLET (30 MG TOTAL) BY MOUTH DAILY. NEED APPT FOR FUTURE REFILLS [phone] 04/06/19   Larey Dresser, MD  nystatin (MYCOSTATIN) 100000 UNIT/ML suspension Take 5  mLs (500,000 Units total) by mouth 4 (four) times daily. As needed for thrush symptoms 03/11/19   Martyn Ehrich, NP  Omeprazole (PRILOSEC PO) Take 1 capsule by mouth 2 (two) times daily.     [provider]  potassium chloride SA (KLOR-CON M20) 20 MEQ tablet TAKE 2 TABLETS BY MOUTH DAILY. NEED APPT WITH DR [facility] FOR FUTURE REFILLS [phone] Patient taking differently: Take 40 mEq by mouth daily. TAKE 2 TABLETS BY MOUTH DAILY. NEED APPT WITH DR [facility] FOR FUTURE REFILLS [phone] 03/31/19   Martyn Ehrich, NP  simethicone (MYLICON) 80 MG chewable tablet Chew 80 mg by mouth daily.    [provider]    Physical Exam: There were no vitals filed for this visit.  Constitutional: NAD, calm  Eyes: PERTLA, lids and conjunctivae normal ENMT: Mucous membranes are moist. Posterior pharynx clear of any exudate or lesions.   Neck: normal, supple, no masses, no thyromegaly Respiratory: no wheezing, no crackles. No accessory muscle use.  Cardiovascular: S1 & S2 heard, regular rate and rhythm. No extremity edema.   Abdomen: No distension, no tenderness, soft. Bowel sounds active.  Musculoskeletal: no clubbing / cyanosis. No joint deformity upper and lower extremities.    Skin: no significant rashes, lesions, ulcers. Warm, dry, well-perfused. Neurologic: CN 2-12 grossly intact. Sensation intact. Strength 5/5 in all 4 limbs.  Psychiatric: Alert and oriented x 3. Calm, cooperative.    Labs on Admission: I have personally reviewed following labs and imaging studies  CBC: No results for input(s): WBC, NEUTROABS, HGB, HCT, MCV, PLT in the last 168 hours. Basic Metabolic Panel: No results for input(s): NA, K, CL, CO2, GLUCOSE, BUN, CREATININE, CALCIUM, MG, PHOS in the last 168 hours. GFR: CrCl cannot be calculated (Patient's most recent lab result is older than the maximum 21 days allowed.). Liver Function Tests: No results for input(s): AST, ALT, ALKPHOS, BILITOT, PROT,  ALBUMIN in the last 168 hours. No results for input(s): LIPASE, AMYLASE in the last 168 hours. No results for input(s): AMMONIA in the last 168 hours. Coagulation Profile: No results for input(s): INR, PROTIME in the last 168 hours. Cardiac Enzymes: No results for input(s): CKTOTAL, CKMB, CKMBINDEX, TROPONINI in the last 168 hours. BNP (last 3 results) Recent Labs    03/22/19 1012  PROBNP 62.0   HbA1C: No results for input(s): HGBA1C in the last 72 hours. CBG: No results for input(s): GLUCAP in the last 168 hours. Lipid Profile: No results for input(s): CHOL, HDL, LDLCALC, TRIG, CHOLHDL, LDLDIRECT in the last 72 hours. Thyroid Function Tests: No results for input(s): TSH, T4TOTAL, FREET4, T3FREE, THYROIDAB in the last 72 hours. Anemia Panel: No results for input(s): VITAMINB12, FOLATE, FERRITIN, TIBC, IRON, RETICCTPCT in the last 72 hours. Urine analysis: No results found for: COLORURINE, APPEARANCEUR, LABSPEC, PHURINE, GLUCOSEU, HGBUR, BILIRUBINUR, KETONESUR, PROTEINUR, UROBILINOGEN, NITRITE, LEUKOCYTESUR Sepsis Labs: @LABRCNTIP (procalcitonin:4,lacticidven:4) )No results found for this or any previous visit (from the  past 240 hour(s)).   Radiological Exams on Admission: No results found.  EKG: Independently reviewed. Sinus rhythm, RBBB.   Assessment/Plan   1. Chronic diastolic CHF, RV failure; chronic hypoxic and hypercarbic respiratory failure  - Patient was admitted 8/8 to [facility] with AoC resp failure suspected secondary to pulm edema and possible PNA, was intubated 8/8, diuresed, and extubated 8/10  - On arrival to [facility], patient is in no distress, saturations in mid 90's on 4 Lpm supplemental O2  - Appears euvolemic, will resume oral Lasix, continue beta-blocker  - Cardiology planning [place] for 04/21/19; COVID testing was negative at [facility] on 04/17/19    2. Possible PNA  - Chest imaging at outside hospital concerning for RLL infection or aspiration  - He was started on  Rocephin and azithromycin 8/8, cultures negative thus far  - Continue current antibiotics, follow cultures and clinical course    3. Pulmonary HTN  - Planned for RHC, continue diuretics and supplemental O2    4. Asthma  - No cough or wheezing on admission  - Continue ICS/LABA and as-needed albuterol     PPE: Mask, face shield  DVT prophylaxis: Lovenox  Code Status: Full  Family Communication: Discussed with patient  Consults called: None  Admission status: Inpatient     Vianne Bulls, MD [facility] Pager [phone]  If 7PM-7AM, please contact night-coverage [email or web address] Password [facility]  04/20/2019, 7:51 PM

## 2019-04-21 ENCOUNTER — Inpatient Hospital Stay (HOSPITAL_COMMUNITY): Payer: Managed Care, Other (non HMO)

## 2019-04-21 ENCOUNTER — Ambulatory Visit (HOSPITAL_COMMUNITY)
Admission: RE | Admit: 2019-04-21 | Payer: Managed Care, Other (non HMO) | Source: Home / Self Care | Admitting: Cardiology

## 2019-04-21 ENCOUNTER — Encounter (HOSPITAL_COMMUNITY): Payer: Self-pay | Admitting: Cardiology

## 2019-04-21 ENCOUNTER — Encounter (HOSPITAL_COMMUNITY)
Admission: AD | Disposition: A | Discharge status: Home / Self Care | Payer: Self-pay | Source: Other Acute Inpatient Hospital | Attending: Internal Medicine

## 2019-04-21 ENCOUNTER — Telehealth (HOSPITAL_COMMUNITY): Payer: Self-pay

## 2019-04-21 ENCOUNTER — Encounter (HOSPITAL_COMMUNITY): Admission: RE | Payer: Self-pay | Source: Home / Self Care

## 2019-04-21 ENCOUNTER — Observation Stay (HOSPITAL_COMMUNITY): Payer: Managed Care, Other (non HMO)

## 2019-04-21 DIAGNOSIS — J9611 Chronic respiratory failure with hypoxia: Secondary | ICD-10-CM | POA: Diagnosis not present

## 2019-04-21 DIAGNOSIS — K219 Gastro-esophageal reflux disease without esophagitis: Secondary | ICD-10-CM | POA: Diagnosis present

## 2019-04-21 DIAGNOSIS — R0602 Shortness of breath: Secondary | ICD-10-CM | POA: Diagnosis not present

## 2019-04-21 DIAGNOSIS — J984 Other disorders of lung: Secondary | ICD-10-CM | POA: Diagnosis present

## 2019-04-21 DIAGNOSIS — Q676 Pectus excavatum: Secondary | ICD-10-CM | POA: Diagnosis not present

## 2019-04-21 DIAGNOSIS — J4540 Moderate persistent asthma, uncomplicated: Secondary | ICD-10-CM | POA: Diagnosis present

## 2019-04-21 DIAGNOSIS — E876 Hypokalemia: Secondary | ICD-10-CM | POA: Diagnosis present

## 2019-04-21 DIAGNOSIS — I5032 Chronic diastolic (congestive) heart failure: Secondary | ICD-10-CM | POA: Diagnosis not present

## 2019-04-21 DIAGNOSIS — J9601 Acute respiratory failure with hypoxia: Secondary | ICD-10-CM | POA: Diagnosis present

## 2019-04-21 DIAGNOSIS — D696 Thrombocytopenia, unspecified: Secondary | ICD-10-CM | POA: Diagnosis present

## 2019-04-21 DIAGNOSIS — I5082 Biventricular heart failure: Secondary | ICD-10-CM | POA: Diagnosis present

## 2019-04-21 DIAGNOSIS — G4733 Obstructive sleep apnea (adult) (pediatric): Secondary | ICD-10-CM | POA: Diagnosis present

## 2019-04-21 DIAGNOSIS — R739 Hyperglycemia, unspecified: Secondary | ICD-10-CM | POA: Diagnosis present

## 2019-04-21 DIAGNOSIS — E874 Mixed disorder of acid-base balance: Secondary | ICD-10-CM | POA: Diagnosis present

## 2019-04-21 DIAGNOSIS — Z8249 Family history of ischemic heart disease and other diseases of the circulatory system: Secondary | ICD-10-CM | POA: Diagnosis not present

## 2019-04-21 DIAGNOSIS — Z9981 Dependence on supplemental oxygen: Secondary | ICD-10-CM | POA: Diagnosis not present

## 2019-04-21 DIAGNOSIS — I2609 Other pulmonary embolism with acute cor pulmonale: Secondary | ICD-10-CM

## 2019-04-21 DIAGNOSIS — Z87891 Personal history of nicotine dependence: Secondary | ICD-10-CM | POA: Diagnosis not present

## 2019-04-21 DIAGNOSIS — J9622 Acute and chronic respiratory failure with hypercapnia: Secondary | ICD-10-CM | POA: Diagnosis present

## 2019-04-21 DIAGNOSIS — I5033 Acute on chronic diastolic (congestive) heart failure: Secondary | ICD-10-CM | POA: Diagnosis present

## 2019-04-21 DIAGNOSIS — I509 Heart failure, unspecified: Secondary | ICD-10-CM

## 2019-04-21 DIAGNOSIS — I50812 Chronic right heart failure: Secondary | ICD-10-CM | POA: Diagnosis not present

## 2019-04-21 DIAGNOSIS — I272 Pulmonary hypertension, unspecified: Secondary | ICD-10-CM | POA: Diagnosis not present

## 2019-04-21 DIAGNOSIS — I2722 Pulmonary hypertension due to left heart disease: Secondary | ICD-10-CM | POA: Diagnosis present

## 2019-04-21 DIAGNOSIS — Z79899 Other long term (current) drug therapy: Secondary | ICD-10-CM | POA: Diagnosis not present

## 2019-04-21 DIAGNOSIS — D649 Anemia, unspecified: Secondary | ICD-10-CM | POA: Diagnosis present

## 2019-04-21 DIAGNOSIS — J9612 Chronic respiratory failure with hypercapnia: Secondary | ICD-10-CM | POA: Diagnosis not present

## 2019-04-21 DIAGNOSIS — I2781 Cor pulmonale (chronic): Secondary | ICD-10-CM

## 2019-04-21 DIAGNOSIS — Z7951 Long term (current) use of inhaled steroids: Secondary | ICD-10-CM | POA: Diagnosis not present

## 2019-04-21 DIAGNOSIS — K7460 Unspecified cirrhosis of liver: Secondary | ICD-10-CM | POA: Diagnosis present

## 2019-04-21 HISTORY — PX: RIGHT HEART CATH: CATH118263

## 2019-04-21 LAB — BLOOD GAS, ARTERIAL
Acid-Base Excess: 26.7 mmol/L — ABNORMAL HIGH (ref 0.0–2.0)
Bicarbonate: 54.3 mmol/L — ABNORMAL HIGH (ref 20.0–28.0)
Drawn by: 270271
O2 Content: 3 L/min
O2 Saturation: 96.8 %
Patient temperature: 98.6
pCO2 arterial: 110 mmHg (ref 32.0–48.0)
pH, Arterial: 7.315 — ABNORMAL LOW (ref 7.350–7.450)
pO2, Arterial: 95.5 mmHg (ref 83.0–108.0)

## 2019-04-21 LAB — CBC
HCT: 41.9 % (ref 39.0–52.0)
Hemoglobin: 13.4 g/dL (ref 13.0–17.0)
MCH: 30.7 pg (ref 26.0–34.0)
MCHC: 32 g/dL (ref 30.0–36.0)
MCV: 96.1 fL (ref 80.0–100.0)
Platelets: 156 10*3/uL (ref 150–400)
RBC: 4.36 MIL/uL (ref 4.22–5.81)
RDW: 13.7 % (ref 11.5–15.5)
WBC: 5.9 10*3/uL (ref 4.0–10.5)
nRBC: 0 % (ref 0.0–0.2)

## 2019-04-21 LAB — COMPREHENSIVE METABOLIC PANEL
ALT: 23 U/L (ref 0–44)
AST: 21 U/L (ref 15–41)
Albumin: 3.2 g/dL — ABNORMAL LOW (ref 3.5–5.0)
Alkaline Phosphatase: 52 U/L (ref 38–126)
Anion gap: 9 (ref 5–15)
BUN: 10 mg/dL (ref 6–20)
CO2: 45 mmol/L — ABNORMAL HIGH (ref 22–32)
Calcium: 8.4 mg/dL — ABNORMAL LOW (ref 8.9–10.3)
Chloride: 83 mmol/L — ABNORMAL LOW (ref 98–111)
Creatinine, Ser: 0.57 mg/dL — ABNORMAL LOW (ref 0.61–1.24)
GFR calc Af Amer: >60 mL/min (ref >60)
GFR calc non Af Amer: >60 mL/min (ref >60)
Glucose, Bld: 111 mg/dL — ABNORMAL HIGH (ref 70–99)
Potassium: 3.5 mmol/L (ref 3.5–5.1)
Sodium: 137 mmol/L (ref 135–145)
Total Bilirubin: 0.9 mg/dL (ref 0.3–1.2)
Total Protein: 6.3 g/dL — ABNORMAL LOW (ref 6.5–8.1)

## 2019-04-21 LAB — COOXEMETRY PANEL
Carboxyhemoglobin: 2.2 % — ABNORMAL HIGH (ref 0.5–1.5)
Methemoglobin: 1.1 % (ref 0.0–1.5)
O2 Saturation: 89.3 %
Total hemoglobin: 13.6 g/dL (ref 12.0–16.0)

## 2019-04-21 LAB — MAGNESIUM: Magnesium: 2.1 mg/dL (ref 1.7–2.4)

## 2019-04-21 LAB — HIV ANTIBODY (ROUTINE TESTING W REFLEX): HIV Screen 4th Generation wRfx: NONREACTIVE

## 2019-04-21 LAB — PROCALCITONIN: Procalcitonin: 0.19 ng/mL

## 2019-04-21 SURGERY — RIGHT HEART CATH
Anesthesia: LOCAL

## 2019-04-21 MED ORDER — ASPIRIN 81 MG PO CHEW: 81.0000 mg | CHEWABLE_TABLET | ORAL | Status: DC

## 2019-04-21 MED ORDER — POTASSIUM CHLORIDE 10 MEQ/100ML IV SOLN: 10.0000 meq | INTRAVENOUS | Status: DC

## 2019-04-21 MED ORDER — SODIUM CHLORIDE 0.9 % IV SOLN
INTRAVENOUS | Status: DC | PRN
  Administered 2019-04-21: 20 mL/h via INTRAVENOUS

## 2019-04-21 MED ORDER — GADOBUTROL 1 MMOL/ML IV SOLN
10.0000 mL | Freq: Once | INTRAVENOUS | Status: AC | PRN
  Administered 2019-04-21: 10 mL via INTRAVENOUS

## 2019-04-21 MED ORDER — FUROSEMIDE 10 MG/ML IJ SOLN
40.0000 mg | Freq: Two times a day (BID) | INTRAMUSCULAR | Status: DC
  Administered 2019-04-21 – 2019-04-23 (×4): 40 mg via INTRAVENOUS
  Filled 2019-04-21 (×4): qty 4

## 2019-04-21 MED ORDER — SODIUM CHLORIDE 0.9 % IV SOLN
1.0000 g | INTRAVENOUS | Status: AC
  Administered 2019-04-22 – 2019-04-23 (×2): 1 g via INTRAVENOUS
  Filled 2019-04-21 (×3): qty 10

## 2019-04-21 MED ORDER — SODIUM CHLORIDE 0.9 % IV SOLN: 250.0000 mL | INTRAVENOUS | Status: DC | PRN

## 2019-04-21 MED ORDER — ACETAZOLAMIDE 250 MG PO TABS
250.0000 mg | ORAL_TABLET | Freq: Two times a day (BID) | ORAL | Status: DC
  Administered 2019-04-21 – 2019-04-22 (×4): 250 mg via ORAL
  Filled 2019-04-21 (×3): qty 1

## 2019-04-21 MED ORDER — SODIUM CHLORIDE 0.9% FLUSH: 3.0000 mL | INTRAVENOUS | Status: DC | PRN

## 2019-04-21 MED ORDER — LIDOCAINE HCL (PF) 1 % IJ SOLN
INTRAMUSCULAR | Status: DC | PRN
  Administered 2019-04-21: 2 mL

## 2019-04-21 MED ORDER — AZITHROMYCIN 500 MG PO TABS
250.0000 mg | ORAL_TABLET | Freq: Every day | ORAL | Status: DC
  Administered 2019-04-21: 250 mg via ORAL
  Filled 2019-04-21: qty 1

## 2019-04-21 MED ORDER — SODIUM CHLORIDE 0.9 % IV SOLN: INTRAVENOUS | Status: DC

## 2019-04-21 MED ORDER — SODIUM CHLORIDE 0.9% FLUSH
3.0000 mL | Freq: Two times a day (BID) | INTRAVENOUS | Status: DC
  Administered 2019-04-21 – 2019-04-26 (×9): 3 mL via INTRAVENOUS

## 2019-04-21 MED ORDER — HEPARIN (PORCINE) IN NACL 1000-0.9 UT/500ML-% IV SOLN
INTRAVENOUS | Status: DC | PRN
  Administered 2019-04-21: 500 mL

## 2019-04-21 MED ORDER — POTASSIUM CHLORIDE CRYS ER 20 MEQ PO TBCR
20.0000 meq | EXTENDED_RELEASE_TABLET | Freq: Once | ORAL | Status: AC
  Administered 2019-04-21: 20 meq via ORAL
  Filled 2019-04-21: qty 1

## 2019-04-21 MED ORDER — SENNOSIDES-DOCUSATE SODIUM 8.6-50 MG PO TABS: 2.0000 | ORAL_TABLET | Freq: Every evening | ORAL | Status: DC | PRN

## 2019-04-21 MED ORDER — ACETAZOLAMIDE 250 MG PO TABS: 250.0000 mg | ORAL_TABLET | Freq: Once | ORAL | Status: DC

## 2019-04-21 SURGICAL SUPPLY — 6 items
CATH BALLN WEDGE 5F 110CM (CATHETERS) ×1 IMPLANT
PACK CARDIAC CATHETERIZATION (CUSTOM PROCEDURE TRAY) ×2 IMPLANT
SHEATH GLIDE SLENDER 4/5FR (SHEATH) ×1 IMPLANT
TRANSDUCER W/STOPCOCK (MISCELLANEOUS) ×2 IMPLANT
TUBING ART PRESS 72  MALE/FEM (TUBING) ×1
TUBING ART PRESS 72 MALE/FEM (TUBING) IMPLANT

## 2019-04-21 NOTE — Telephone Encounter (Signed)
LATE ENTRY:  04/20/2019 4:45 PM Dr Aundra Dubin was made aware of this situation, he states he was at the beach this weekend and became worse so he was admitted to hospital there, they have contacted him and pt is going to be transferred here to [facility] on 8/12, RHC has been cancelled.

## 2019-04-21 NOTE — Progress Notes (Signed)
Patient setup on BiPAP for QHS and doing well. Setup for home settings of 12/8 and with a O2 bleed in of 4-6L. Patient tolerating well. Placed on our face mask for better seal at this time due to him not having his chin strap. Will call if ay further assistance needed.

## 2019-04-21 NOTE — Interval H&P Note (Signed)
History and Physical Interval Note:  04/21/2019 10:49 AM  [name]  has presented today for surgery, with the diagnosis of Hear failure.  The various methods of treatment have been discussed with the patient and family. After consideration of risks, benefits and other options for treatment, the patient has consented to  Procedure(s): RIGHT HEART CATH (N/A) as a surgical intervention.  The patient's history has been reviewed, patient examined, no change in status, stable for surgery.  I have reviewed the patient's chart and labs.  Questions were answered to the patient's satisfaction.     Lloyde Ludlam [employer]

## 2019-04-21 NOTE — Progress Notes (Signed)
   Came to see patient for pccm consult but patient gone to cardiac MRI  D/w Dr Loralie Champagne - he is concerned about left lung collapse based on outside CT chest reporte from [facility]  At baseline - using BiPAP QHS but eats and drinks through ti  Plan Get ct chest without contrast  Get ABG  Will try to reach to see if there is a surgeon for pectus excavactium in [place]    Will staff formal consult 04/22/19       SIGNATURE    Dr. Brand Males, M.D., F.C.C.P,  Pulmonary and Critical Care Medicine Staff Physician, Home Director - Interstitial Lung Disease  Program  Pulmonary [place] at [place], [place], [address]  Pager: [phone], If no answer or between  15:00h - 7:00h: call [phone] Telephone: [phone]  5:51 PM 04/21/2019

## 2019-04-21 NOTE — Telephone Encounter (Signed)
-----   Message from Beatrix Fetters, RN sent at 04/20/2019  2:26 PM EDT ----- [name] I am reaching out to you regarding [name].  According to some notes in the chart and care everywhere, it looks like he is in [facility] with respiratory failure.  I spoke with Dr. Aundra Dubin and he asked to bring you in on the situation. Dr. Aundra Dubin would like for you to see if you can find out more about what is going on with him.  He was supposed to have a RHC with Dr. Aundra Dubin tomorrow.  Thank you so much!  Rexanne Mano, RN

## 2019-04-21 NOTE — Progress Notes (Signed)
Visit made to patients room to help him apply BIPAP.  Patient ask that I attach 3LPM of oxygen to BIPAP so that he can apply when he is ready for bed.  He wears it every night and when not on BIPAP continues to wear the 3LPM N/C.  Both devices are running at his bedside for his convenience to remove and apply as needed.  Patient continues to remain alert and pleasant.  Patients work of breathing is not labored no distress noted.  Advised patient if he has any issues with the BIPAP or breathing throughout the night to have the RN call RT for assistance.

## 2019-04-21 NOTE — Progress Notes (Signed)
ABG obtained patients C02 is 110 patient states he normally has a high C02.  He is alert and oriented and breathing is normal with no distress to note. I reported panic value to RN and to [facility].  Patient wears BIPAP QHS at home and here as well.  RT will place patient on BIPAP when he returns from CT.  Currently patient is on 3 LPM N/C.

## 2019-04-21 NOTE — Consult Note (Addendum)
NAME:  [name], MRN:  [id number], DOB:  [birth year], LOS: 1 ADMISSION DATE:  04/20/2019, CONSULTATION DATE:  8/12 REFERRING MD:  Dr. Reesa Chew, CHIEF COMPLAINT:  Chronic respiratory failure   Brief History   38 year old male with restrictive lund disease in the setting of pectus excavatum transferred to [facility] 8/11 from hospital in [place] where he was admitted and intubated for pulmonary edema +/- pneumonia.  Pulmonary consulted for restrictive lung disease.  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 He has had two reconstructive surgeries for his pectus, first in childhood, and most recently in 2011 a redo at [facility]. He was re-evaluated in 2019 for further surgical options at [facility], where it was determined there were none. He is also followed by the advanced CHF clinic and has been worked up with cardiac MRI in the past, demonstrating LA compression from pectus. On last visit in 03/2019 his lasix dose was adjusted and he was scheduled for RHC on 8/11, however, he required hospitalization.   [place] beach [place] he presented the ED ([place]) with complaints of progressive dyspnea x months. Upon presentation he was in significant respiratory distress with hypoxemic and hypercarbic failure on ABG. CTA was negative for PE, but concerning for RLL infectious process +/- pulmonary edema. He ultimately required intubation. Treated with ceftriaxone/azithromycin and diuresis and was extubated on 8/10. He was then transferred to [facility] on 8/11 for ongoing workup including RHC. PCCM consulted for chronic respiratory failure.    Past Medical History   has a past medical history of Asthma, CHF (congestive heart failure) ([place]), Chronic hypercapnic respiratory failure ([place]), Pectus  excavatum, and Restrictive lung disease.   Significant Hospital Events   8/7 > 8/10 admit to OSH for pulm edema and CAP requiring intuabtion 8/10 extubated 8/11 transfer to [facility] 8/12 [facility]  Consults:  CHF Pulmonary  Procedures:    Significant Diagnostic Tests:  [facility] 8/12 >  Micro Data:    Antimicrobials:  Ceftriaxone 8/8 > Azithromycin 8/8 >   Interim history/subjective:    Objective   Blood pressure (!) 144/87, pulse 89, temperature 98.3 F (36.8 C), temperature source Oral, resp. rate (!) 27, height 6\' 4"  (1.93 m), weight 92.6 kg, SpO2 97 %.        Intake/Output Summary (Last 24 hours) at 04/21/2019 1017 Last data filed at 04/20/2019 2200 Gross per 24 hour  Intake 480 ml  Output -  Net 480 ml   Filed Weights   04/20/19 2100 04/21/19 0300 04/21/19 1008  Weight: 91.6 kg 92.3 kg 92.6 kg    Examination: General: middle aged adult male in NAD HENT: [place]/AT, PERRL, No JVD Lungs: Clear bilateral breath sounds Cardiovascular: RRR, no MRG. Concave L sternal region.  Abdomen: Soft, non-tender, non-distended Extremities: No acute deformity or ROM limitation Neuro: Alert, oriented, non-focal.   Resolved Hospital Problem list     Assessment & Plan:   Acute on chronic hypoxemic and hypercarbic respiratory failure: Improving. He has chronic failure in the setting of restrictive lung disease due to pectus excavatum. Intubated form 8/7 to 8/10 at OSH for pulmonary edema +/- CAP improving with diuresis and antibiotics.  Plan - Nocturnal BiPAP - Supplemental O2 24/7 titrated to keep SpO2 90 - 95% (3L baseline) -  Continue home Symbicort and PRN albuterol  - Repeat CXR. RML/RLL infection noted at OSH likely consistent with chronic findings on prior exams.  - Would seek brief duration of antibiotics. PCT low. Cultures have so far been negative from OSH. Today is day 5 CTX and azithro. Reasonable to DC. Per primary.  - Patient interested in second opinion regarding  surgical options. Will defer to attending.   Pulmonary hypertension multifactorial in the setting of chronic hypoxia and LA compression.  - RHC today - Diurese as indicated per CHF service. - Compliance with BiPAP and supplemental O2 deeply necessary.    Best practice:  Diet: Per Primary Pain/Anxiety/Delirium protocol (if indicated): NA VAP protocol (if indicated): NA DVT prophylaxis: Enoxaparin ppx GI prophylaxis: PPI Glucose control: NA Mobility: per primary Code Status: FULL Family Communication: patient updated at length with wife at bedside Disposition: PCU  Labs   CBC: Recent Labs  Lab 04/21/19 0235  WBC 5.9  HGB 13.4  HCT 41.9  MCV 96.1  PLT 824    Basic Metabolic Panel: Recent Labs  Lab 04/21/19 0235  NA 137  K 3.5  CL 83*  CO2 45*  GLUCOSE 111*  BUN 10  CREATININE 0.57*  CALCIUM 8.4*  MG 2.1   GFR: Estimated Creatinine Clearance: 152.2 mL/min (A) (by C-G formula based on SCr of 0.57 mg/dL (L)). Recent Labs  Lab 04/21/19 0235  PROCALCITON 0.19  WBC 5.9    Liver Function Tests: Recent Labs  Lab 04/21/19 0235  AST 21  ALT 23  ALKPHOS 52  BILITOT 0.9  PROT 6.3*  ALBUMIN 3.2*   No results for input(s): LIPASE, AMYLASE in the last 168 hours. No results for input(s): AMMONIA in the last 168 hours.  ABG    Component Value Date/Time   PHART 7.460 (H) 02/15/2009 0932   PCO2ART 58.4 (HH) 02/15/2009 0932   PO2ART 51.0 (L) 02/15/2009 0932   HCO3 41.5 (H) 02/15/2009 0932   TCO2 34 06/12/2010 1327   O2SAT 86.0 02/15/2009 0932     Coagulation Profile: No results for input(s): INR, PROTIME in the last 168 hours.  Cardiac Enzymes: No results for input(s): CKTOTAL, CKMB, CKMBINDEX, TROPONINI in the last 168 hours.  HbA1C: No results found for: HGBA1C  CBG: No results for input(s): GLUCAP in the last 168 hours.  Review of Systems:   Bolds are positive  Constitutional: weight loss, gain, night sweats, Fevers, chills, fatigue .  HEENT:  headaches, Sore throat, sneezing, nasal congestion, post nasal drip, Difficulty swallowing, Tooth/dental problems, visual complaints visual changes, ear ache CV:  chest pain, radiates:,Orthopnea, PND, swelling in lower extremities**, dizziness, palpitations, syncope.  GI  heartburn, indigestion, abdominal pain, nausea, vomiting, diarrhea, change in bowel habits, loss of appetite, bloody stools.  Resp: cough, nonproductive: started post extuabtion, hemoptysis, dyspnea on exertion, chest pain, pleuritic.  Skin: rash or itching or icterus GU: dysuria, change in color of urine, urgency or frequency. flank pain, hematuria  MS: joint pain or swelling. decreased range of motion  Psych: change in mood or affect. depression or anxiety.  Neuro: difficulty with speech, weakness, numbness, ataxia    Past Medical History  He,  has a past medical history of Asthma, CHF (congestive heart failure) ([place]), Chronic hypercapnic respiratory failure ([place]), Pectus excavatum, and Restrictive lung disease.   Surgical History    Past Surgical History:  Procedure Laterality Date  . KNEE SURGERY     right  . PECTUS EXCAVATUM REPAIR  Social History   reports that he quit smoking about 12 years ago. His smoking use included cigarettes. He has a 0.10 pack-year smoking history. He has never used smokeless tobacco. He reports that he does not drink alcohol or use drugs.   Family History   His family history includes Hypertension in his mother. There is no history of Heart attack or Stroke.   Allergies Allergies  Allergen Reactions  . Other Itching    Pet Dander     Home Medications  Prior to Admission medications   Medication Sig Start Date End Date Taking? Authorizing Provider  acetaminophen (TYLENOL) 500 MG tablet Take 1,000 mg by mouth every 6 (six) hours as needed for mild pain.   Yes [provider]  albuterol (PROAIR HFA) 108 (90 BASE) MCG/ACT inhaler Inhale 2 puffs into the lungs every 6  (six) hours as needed for wheezing or shortness of breath. 04/29/14  Yes Clance, Armando Reichert, MD  albuterol (PROVENTIL) (2.5 MG/3ML) 0.083% nebulizer solution Take 3 mLs (2.5 mg total) by nebulization every 6 (six) hours as needed for wheezing or shortness of breath. 08/14/18  Yes Martyn Ehrich, NP  amphetamine-dextroamphetamine (ADDERALL) 10 MG tablet Take 10 mg by mouth 4 (four) times daily as needed.   Yes [provider]  bisoprolol (ZEBETA) 5 MG tablet TAKE 1 TABLET BY MOUTH EVERY DAY 03/22/19  Yes Larey Dresser, MD  budesonide-formoterol [facility]) 80-4.5 MCG/ACT inhaler Inhale 2 puffs into the lungs 2 (two) times a day. 03/11/19  Yes Martyn Ehrich, NP  cetirizine (ZYRTEC) 10 MG tablet Take 1 tablet (10 mg total) by mouth daily. 08/18/12  Yes Clance, Armando Reichert, MD  furosemide (LASIX) 40 MG tablet TAKE 1 TAB IN AM AND 1/2 TAB IN PM Patient taking differently: Take 40 mg by mouth 2 (two) times daily.  04/06/19  Yes Larey Dresser, MD  isosorbide mononitrate (IMDUR) 30 MG 24 hr tablet TAKE 1 TABLET (30 MG TOTAL) BY MOUTH DAILY. NEED APPT FOR FUTURE REFILLS [phone] 04/06/19  Yes Larey Dresser, MD  Melatonin 10 MG TABS Take 10 mg by mouth at bedtime.   Yes [provider]  nystatin (MYCOSTATIN) 100000 UNIT/ML suspension Take 5 mLs (500,000 Units total) by mouth 4 (four) times daily. As needed for thrush symptoms 03/11/19  Yes Martyn Ehrich, NP  Omeprazole (PRILOSEC PO) Take 1 capsule by mouth 2 (two) times daily.    Yes [provider]  potassium chloride SA (KLOR-CON M20) 20 MEQ tablet TAKE 2 TABLETS BY MOUTH DAILY. NEED APPT WITH DR [facility] FOR FUTURE REFILLS [phone] Patient taking differently: Take 40 mEq by mouth daily. TAKE 2 TABLETS BY MOUTH DAILY. NEED APPT WITH DR [facility] FOR FUTURE REFILLS [phone] 03/31/19  Yes Martyn Ehrich, NP  simethicone (MYLICON) 80 MG chewable tablet Chew 80 mg by mouth as needed for flatulence.    Yes [provider]      Georgann Housekeeper, AGACNP-BC [place] Pager [phone] or [phone]  04/21/2019 2:14 PM

## 2019-04-21 NOTE — Progress Notes (Signed)
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  He was noted to be in hypercarbic respiratory failure requiring intubation.  Transferred from outside hospital [facility]).  D-dimer was elevated, CTA chest was negative.  Concerns for right lower lobe infection versus aspiration therefore started on diuretics along with Rocephin and azithromycin.  Extubated to BiPAP on 04/19/2019.  Transferred to [facility] for elective right heart catheterization.   Assessment & Plan:   Principal Problem:   Pulmonary hypertension (HCC) Active Problems:   Moderate persistent asthma in adult without complication   Restrictive lung disease   Chronic respiratory failure with hypoxia and hypercapnia (HCC)   Chronic diastolic CHF (congestive heart failure) (HCC)   Chronic right-sided CHF (congestive heart failure) (HCC)   Right heart failure (HCC)  Acute on chronic hypercarbic respiratory failure, improved Restrictive lung disease secondary to his extrathoracic abnormality Chronic diastolic congestive heart failure with right ventricular failure - Initially intubated at outside hospital then extubated 8/10 after diuresis.  Transferred here for right heart catheterization which is planned for 04/21/2019.  COVID-negative. -Currently on oral Lasix, isosorbide mononitrate and beta-blocker. -Echocardiogram 04/18/2019-ejection fraction 55%, abnormal septal motion consistent with intraventricular conduction delay, grade 1 diastolic dysfunction, severe right ventricular  enlargement _Plans for Cardiac MRI as well. Perhaps we can refer him for a second Opinion for any further surgical intervention. -Pulm Team to see the patient.  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 Second repair in 02/2010  - excision of large cartilages left from the first procedure.    Right lower lobe infiltrate concerning for pneumonia - Stop Azithromycin at D5 and Rocephin at D7.  Check procalcitonin level. -Supplemental oxygen, supportive care -Follow-up culture data.  Pulmonary hypertension, group 2 - Plan right heart cath  Chronic asthma - Bronchodilators.  Records reviewed in Care Everywhere-CTA chest showed negative for pulmonary embolism but massive enlargement of heart and main pulmonary artery, mild diffuse smooth intralobular septal thickening throughout the lung likely early interstitial edema.  Opacity seen in right lower lobe concerning for infection versus aspiration?  Complete collapse of left lung predominantly from mass-effect of enlarged heart  DVT prophylaxis: Lovenox Code Status: Full code Family Communication:  Family at Bedside  Disposition Plan: TBD  Consultants:   Cardiology- CHF  Pulmonary  Procedures:   RHC 8/12  Antimicrobials:   Azi  Roc   Subjective: Patient tolerated his RHC ok this morning. Feels ok otherwise at this time. Uses 2-3 L [place] at home. Family at Bedside. He is now awaiting Cardiac MRI. Still has exertional SOB. He is currently seeking to see if he can have a second opinion for possible reconstruction of his thoracic area again .   Review of Systems Otherwise negative except as per HPI, including: General: Denies fever, chills, night sweats or unintended weight loss. Resp: Denies cough, wheezing, shortness of breath. Cardiac: Denies chest pain, palpitations, orthopnea, paroxysmal nocturnal dyspnea. GI: Denies abdominal pain, nausea, vomiting, diarrhea or constipation GU: Denies dysuria, frequency, hesitancy or  incontinence MS: Denies muscle aches, joint pain or swelling Neuro: Denies headache, neurologic deficits (focal  weakness, numbness, tingling), abnormal gait Psych: Denies anxiety, depression, SI/HI/AVH Skin: Denies new rashes or lesions ID: Denies sick contacts, exotic exposures, travel  Objective: Vitals:   04/20/19 2154 04/20/19 2309 04/20/19 2347 04/21/19 0300  BP:   132/80 121/72  Pulse:  89 99 89  Resp:  (!) 25 (!) 32 (!) 27  Temp:   (!) 97.1 F (36.2 C)   TempSrc:   Axillary Oral  SpO2: 95% 94% 98% 99%  Weight:    92.3 kg  Height:        Intake/Output Summary (Last 24 hours) at 04/21/2019 0728 Last data filed at 04/20/2019 2200 Gross per 24 hour  Intake 480 ml  Output -  Net 480 ml   Filed Weights   04/20/19 2100 04/21/19 0300  Weight: 91.6 kg 92.3 kg    Examination:  General exam: Appears calm and comfortable; thoracic deformity. Pectus excavatum.  Respiratory system: Clear to auscultation. Respiratory effort normal. Concave sternal region.  Cardiovascular system: S1 & S2 heard, RRR. No JVD, murmurs, rubs, gallops or clicks. No pedal edema. Gastrointestinal system: Abdomen is nondistended, soft and nontender. No organomegaly or masses felt. Normal bowel sounds heard. Central nervous system: Alert and oriented. No focal neurological deficits. Extremities: Symmetric 5 x 5 power. Skin: No rashes, lesions or ulcers Psychiatry: Judgement and insight appear normal. Mood & affect appropriate.     Data Reviewed:   CBC: Recent Labs  Lab 04/21/19 0235  WBC 5.9  HGB 13.4  HCT 41.9  MCV 96.1  PLT 130   Basic Metabolic Panel: Recent Labs  Lab 04/21/19 0235  NA 137  K 3.5  CL 83*  CO2 45*  GLUCOSE 111*  BUN 10  CREATININE 0.57*  CALCIUM 8.4*  MG 2.1   GFR: Estimated Creatinine Clearance: 152.2 mL/min (A) (by C-G formula based on SCr of 0.57 mg/dL (L)). Liver Function Tests: Recent Labs  Lab 04/21/19 0235  AST 21  ALT 23  ALKPHOS 52  BILITOT 0.9   PROT 6.3*  ALBUMIN 3.2*   No results for input(s): LIPASE, AMYLASE in the last 168 hours. No results for input(s): AMMONIA in the last 168 hours. Coagulation Profile: No results for input(s): INR, PROTIME in the last 168 hours. Cardiac Enzymes: No results for input(s): CKTOTAL, CKMB, CKMBINDEX, TROPONINI in the last 168 hours. BNP (last 3 results) Recent Labs    03/22/19 1012  PROBNP 62.0   HbA1C: No results for input(s): HGBA1C in the last 72 hours. CBG: No results for input(s): GLUCAP in the last 168 hours. Lipid Profile: No results for input(s): CHOL, HDL, LDLCALC, TRIG, CHOLHDL, LDLDIRECT in the last 72 hours. Thyroid Function Tests: No results for input(s): TSH, T4TOTAL, FREET4, T3FREE, THYROIDAB in the last 72 hours. Anemia Panel: No results for input(s): VITAMINB12, FOLATE, FERRITIN, TIBC, IRON, RETICCTPCT in the last 72 hours. Sepsis Labs: No results for input(s): PROCALCITON, LATICACIDVEN in the last 168 hours.  No results found for this or any previous visit (from the past 240 hour(s)).       Radiology Studies: No results found.      Scheduled Meds: . bisoprolol  5 mg Oral Daily  . enoxaparin (LOVENOX) injection  40 mg Subcutaneous Q24H  . furosemide  40 mg Oral BID  . isosorbide mononitrate  30 mg Oral Daily  . mometasone-formoterol  2 puff Inhalation BID  . pantoprazole  40 mg Oral BID  . potassium chloride SA  40 mEq Oral Daily  . simethicone  80  mg Oral Daily  . sodium chloride flush  3 mL Intravenous Q12H  . sodium chloride flush  3 mL Intravenous Q12H   Continuous Infusions: . sodium chloride       LOS: 1 day   Time spent= 35 mins    Jude Linck Arsenio Loader, MD [facility]  If 7PM-7AM, please contact night-coverage [email or web address] 04/21/2019, 7:28 AM

## 2019-04-21 NOTE — Telephone Encounter (Signed)
LATE ENTRY: 04/20/19 2:50p Received message from [place] in cath lab regarding patient hospitalization at an outside hospital with planned procedure for 8/12 pending.  Spoke with patients wife, she states patient was admitted for respiratory challenges and is being transferred to [facility] by critical care team. Made Dr Aundra Dubin aware and was instructed to send note to [place] to cancel procedure as it will be done while in patient.

## 2019-04-21 NOTE — H&P (View-Only) (Signed)
Advanced Heart Failure Team Consult Note   Primary Physician: [name], PA-C PCP-Cardiologist:  Dr Aundra Dubin  Pulmonary: Dr Lake Bells   Reason for Consultation: Heart Failure   HPI:    [name] is seen today for evaluation of heart failure at the request of Dr Reesa Chew.  Mr Moline is a 38 year old with a history of  pectus excavatum s/p repair in childhood and redo pectus repair in 6/11, OSA Bipap, asthma, HF, chronic hypoxic respiratory failure on 2-3 liters continuously, and restrictive lung disease. Last echo was in 5/16 and showed normal LV EF but did not show the RV well.  He then got a CMRI for better view of the RV.  This showed moderate to severe RV dilation with mild to moderate RV systolic dysfunction, RV EF 37%.  This looked very similar to the 2010 MRI.   He was last seen in the HF clinic in July. Due to increased dyspnea and volume overload he was set up for [place] for August 11th.  Lasix was increased to 60 mg/40 mg x4 days then back to lasix 40 mg twice a day.   Last week he went to the beach and developed increased shortness of breath. Presented to the ED with A/C respiratory failure requiring intubation. There was concern for possible aspiration so rocephin an azithromycin given.  CTA negative for PE, interstitial edema and RLL infection.  Diuresed with IV lasix with improvement. Yesterday he was extubated to 4 liters  and transferred to [facility] fro ongoing management. Pertinent admission labs included: Creatinine 0.57, K 3.5, WBC 5.9. No CXR on arrival.    Ongoing SOB with exertion.     Review of Systems: [y] = yes, [ ]  = no   . General: Weight gain [ ] ; Weight loss [ ] ; Anorexia [ ] ; Fatigue [Y ]; Fever [ ] ; Chills [ ] ; Weakness [ ]   . Cardiac: Chest pain/pressure [ ] ; Resting SOB [ ] ; Exertional SOB [Y]; Orthopnea [ ] ; Pedal Edema [ ] ; Palpitations [ ] ; Syncope [ ] ; Presyncope [ ] ; Paroxysmal nocturnal dyspnea[ ]   . Pulmonary: Cough [ ] ; Wheezing[ ] ; Hemoptysis[  ]; Sputum [ ] ; Snoring [ ]   . GI: Vomiting[ ] ; Dysphagia[ ] ; Melena[ ] ; Hematochezia [ ] ; Heartburn[ ] ; Abdominal pain [ ] ; Constipation [ ] ; Diarrhea [ ] ; BRBPR [ ]   . GU: Hematuria[ ] ; Dysuria [ ] ; Nocturia[ ]   . Vascular: Pain in legs with walking [ ] ; Pain in feet with lying flat [ ] ; Non-healing sores [ ] ; Stroke [ ] ; TIA [ ] ; Slurred speech [ ] ;  . Neuro: Headaches[ ] ; Vertigo[ ] ; Seizures[ ] ; Paresthesias[ ] ;Blurred vision [ ] ; Diplopia [ ] ; Vision changes [ ]   . Ortho/Skin: Arthritis [ ] ; Joint pain [Y ]; Muscle pain [ ] ; Joint swelling [ ] ; Back Pain [Y ]; Rash [ ]   . Psych: Depression[ Y]; Anxiety[ ]   . Heme: Bleeding problems [ ] ; Clotting disorders [ ] ; Anemia [ ]   . Endocrine: Diabetes [ ] ; Thyroid dysfunction[ ]   Home Medications Prior to Admission medications   Medication Sig Start Date End Date Taking? Authorizing Provider  acetaminophen (TYLENOL) 500 MG tablet Take 1,000 mg by mouth every 6 (six) hours as needed for mild pain.   Yes [provider]  albuterol (PROAIR HFA) 108 (90 BASE) MCG/ACT inhaler Inhale 2 puffs into the lungs every 6 (six) hours as needed for wheezing or shortness of breath. 04/29/14  Yes Clance, Lanny Hurst  M, MD  albuterol (PROVENTIL) (2.5 MG/3ML) 0.083% nebulizer solution Take 3 mLs (2.5 mg total) by nebulization every 6 (six) hours as needed for wheezing or shortness of breath. 08/14/18  Yes Martyn Ehrich, NP  amphetamine-dextroamphetamine (ADDERALL) 10 MG tablet Take 10 mg by mouth 4 (four) times daily as needed.   Yes [provider]  bisoprolol (ZEBETA) 5 MG tablet TAKE 1 TABLET BY MOUTH EVERY DAY 03/22/19  Yes Larey Dresser, MD  budesonide-formoterol [facility]) 80-4.5 MCG/ACT inhaler Inhale 2 puffs into the lungs 2 (two) times a day. 03/11/19  Yes Martyn Ehrich, NP  cetirizine (ZYRTEC) 10 MG tablet Take 1 tablet (10 mg total) by mouth daily. 08/18/12  Yes Clance, Armando Reichert, MD  furosemide (LASIX) 40 MG tablet TAKE 1 TAB IN AM AND  1/2 TAB IN PM Patient taking differently: Take 40 mg by mouth 2 (two) times daily.  04/06/19  Yes Larey Dresser, MD  isosorbide mononitrate (IMDUR) 30 MG 24 hr tablet TAKE 1 TABLET (30 MG TOTAL) BY MOUTH DAILY. NEED APPT FOR FUTURE REFILLS [phone] 04/06/19  Yes Larey Dresser, MD  Melatonin 10 MG TABS Take 10 mg by mouth at bedtime.   Yes [provider]  nystatin (MYCOSTATIN) 100000 UNIT/ML suspension Take 5 mLs (500,000 Units total) by mouth 4 (four) times daily. As needed for thrush symptoms 03/11/19  Yes Martyn Ehrich, NP  Omeprazole (PRILOSEC PO) Take 1 capsule by mouth 2 (two) times daily.    Yes [provider]  potassium chloride SA (KLOR-CON M20) 20 MEQ tablet TAKE 2 TABLETS BY MOUTH DAILY. NEED APPT WITH DR [facility] FOR FUTURE REFILLS [phone] Patient taking differently: Take 40 mEq by mouth daily. TAKE 2 TABLETS BY MOUTH DAILY. NEED APPT WITH DR [facility] FOR FUTURE REFILLS [phone] 03/31/19  Yes Martyn Ehrich, NP  simethicone (MYLICON) 80 MG chewable tablet Chew 80 mg by mouth as needed for flatulence.    Yes [provider]    Past Medical History: Past Medical History:  Diagnosis Date  . Asthma   . CHF (congestive heart failure) ([place])   . Chronic hypercapnic respiratory failure (HCC)    nocturnal  hypoxemic resp failure, on bipap  . Pectus excavatum   . Restrictive lung disease     Past Surgical History: Past Surgical History:  Procedure Laterality Date  . KNEE SURGERY     right  . PECTUS EXCAVATUM REPAIR      Family History: Family History  Problem Relation Age of Onset  . Hypertension Mother   . Heart attack Neg Hx   . Stroke Neg Hx     Social History: Social History   Socioeconomic History  . Marital status: Married    Spouse name: Not on file  . Number of children: 1  . Years of education: Not on file  . Highest education level: Not on file  Occupational History  . Occupation: [employer] engineer, contracting  Needs  . Financial resource strain: Not on file  . Food insecurity    Worry: Not on file    Inability: Not on file  . Transportation needs    Medical: Not on file    Non-medical: Not on file  Tobacco Use  . Smoking status: Former Smoker    Packs/day: 0.10    Years: 1.00    Pack years: 0.10    Types: Cigarettes    Quit date: 09/09/2006    Years since quitting: 12.6  .  Smokeless tobacco: Never Used  Substance and Sexual Activity  . Alcohol use: No    Frequency: Never  . Drug use: No  . Sexual activity: Not on file  Lifestyle  . Physical activity    Days per week: Not on file    Minutes per session: Not on file  . Stress: Not on file  Relationships  . Social Herbalist on phone: Not on file    Gets together: Not on file    Attends religious service: Not on file    Active member of club or organization: Not on file    Attends meetings of clubs or organizations: Not on file    Relationship status: Not on file  Other Topics Concern  . Not on file  Social History Narrative  . Not on file    Allergies:  Allergies  Allergen Reactions  . Other Itching    Pet Dander    Objective:    Vital Signs:   Temp:  [97.1 F (36.2 C)-98.3 F (36.8 C)] 98.3 F (36.8 C) (08/12 0817) Pulse Rate:  [89-99] 89 (08/12 0300) Resp:  [25-32] 27 (08/12 0300) BP: (121-144)/(72-87) 144/87 (08/12 0817) SpO2:  [94 %-99 %] 97 % (08/12 0732) Weight:  [91.6 kg-92.3 kg] 92.3 kg (08/12 0300) Last BM Date: (Pt is unsure )  Weight change: Filed Weights   04/20/19 2100 04/21/19 0300  Weight: 91.6 kg 92.3 kg    Intake/Output:   Intake/Output Summary (Last 24 hours) at 04/21/2019 0906 Last data filed at 04/20/2019 2200 Gross per 24 hour  Intake 480 ml  Output -  Net 480 ml      Physical Exam    General:  NAD pectus Excavatum  HEENT: normal Neck: supple. JVP 5-6  . Carotids 2+ bilat; no bruits. No lymphadenopathy or thyromegaly appreciated. Cor: PMI nondisplaced. Regular rate  & rhythm. No rubs, gallops or murmurs. Lungs: LLL crackles.  Abdomen: soft, nontender, nondistended. No hepatosplenomegaly. No bruits or masses. Good bowel sounds. Extremities: no cyanosis, clubbing, rash, edema Neuro: alert & orientedx3, cranial nerves grossly intact. moves all 4 extremities w/o difficulty. Affect pleasant   Telemetry   SR 90s personally reviewed.   EKG   None   Labs   Basic Metabolic Panel: Recent Labs  Lab 04/21/19 0235  NA 137  K 3.5  CL 83*  CO2 45*  GLUCOSE 111*  BUN 10  CREATININE 0.57*  CALCIUM 8.4*  MG 2.1    Liver Function Tests: Recent Labs  Lab 04/21/19 0235  AST 21  ALT 23  ALKPHOS 52  BILITOT 0.9  PROT 6.3*  ALBUMIN 3.2*   No results for input(s): LIPASE, AMYLASE in the last 168 hours. No results for input(s): AMMONIA in the last 168 hours.  CBC: Recent Labs  Lab 04/21/19 0235  WBC 5.9  HGB 13.4  HCT 41.9  MCV 96.1  PLT 156    Cardiac Enzymes: No results for input(s): CKTOTAL, CKMB, CKMBINDEX, TROPONINI in the last 168 hours.  BNP: BNP (last 3 results) No results for input(s): BNP in the last 8760 hours.  ProBNP (last 3 results) Recent Labs    03/22/19 1012  PROBNP 62.0     CBG: No results for input(s): GLUCAP in the last 168 hours.  Coagulation Studies: No results for input(s): LABPROT, INR in the last 72 hours.   Imaging    No results found.   Medications:     Current Medications: . bisoprolol  5  mg Oral Daily  . enoxaparin (LOVENOX) injection  40 mg Subcutaneous Q24H  . furosemide  40 mg Oral BID  . isosorbide mononitrate  30 mg Oral Daily  . mometasone-formoterol  2 puff Inhalation BID  . pantoprazole  40 mg Oral BID  . potassium chloride SA  40 mEq Oral Daily  . simethicone  80 mg Oral Daily  . sodium chloride flush  3 mL Intravenous Q12H  . sodium chloride flush  3 mL Intravenous Q12H     Infusions: . sodium chloride          Assessment/Plan   1. A/C Diastolic HF RV  Failure   Patient presented with primarily right-sided CHF in 2010, likely due to pulmonary hypertension from a combination of left atrial compression and nocturnal hypoxemia. He is now using Bipap every night. He had a redo pectus surgery in 2011. Surgery relieved some of the compression on his lungs but followup cardiac MRI in 6/16 did show continued compression of the left atrium between the ascending and descending aorta as well as a dilated RV with RV EF 37%.  Cardiac MRI in 6/16 was very similar to the MRI in 2010. Recently, he has had increased exertional dyspnea Volume status looks ok today. RHC to further assess hemodynamics. Will need repeat CMRI to reassess RV.   2. A/C Respiratory Failure / possible aspiration Intubated at [facility] and extubated on 8/11. On 4 liters with sats in th 80s   3. Pulmonary HTN   4. Restrictive Lung Disease.   Lung disease from restriction due to pectus. He has had chronic hypercarbic respiratory failure.  This is a major player in his chronic dyspnea. He has had 2 operations already for his pectus deformity.  5. Asthma   6. OSA Continue Bipap   Consult Pulmonary. RHC today to further assess hemodynamics.  Length of Stay: 1  Amy Clegg, NP  04/21/2019, 9:06 AM  Advanced Heart Failure Team Pager [phone] (M-F; 7a - 4p)  Please contact [facility] for night-coverage after hours (4p -7a ) and weekends on [email or web address]  Patient seen with NP, agree with the above note.    He has had steady worsening of dyspnea x 2 months or so.  I had increased his Lasix and planned RHC.  He went to the [place] area for vacation. He got progressively short of breath and became confused.  Went to the ER at [facility] and was found to be markedly acidotic with hypercarbic respiratory failure (7.175/148/76).  CTA chest showed no PE but the left lung was collapsed with compression from enlarged RV, there was RLL infiltrate.  Echo showed LV EF 55% with severe RV  enlargement and mild-moderate RV systolic dysfunction.  He was intubated, diuresed.  He was extubated to bipap, and is now on his baseline 4L oxygen by nasal cannula.   General: NAD MSK: Pectus excavatum Neck: JVP ?8 cm, no thyromegaly or thyroid nodule.  Lungs: Decreased BS at bases bilaterally.  CV: Nondisplaced PMI.  Heart regular S1/S2, no S3/S4, no murmur.  No peripheral edema.   Abdomen: Soft, nontender, no hepatosplenomegaly, no distention.  Skin: Intact without lesions or rashes.  Neurologic: Alert and oriented x 3.  Psych: Normal affect. Extremities: No clubbing or cyanosis.  HEENT: Normal.   1. Acute hypercarbic respiratory failure with profound acidosis: Admitted to [facility], intubated initially.  Now extubated.  CTA chest showed collapsed left lung with compression from dilated RV, no PE, RLL infiltrate (aspiration versus PNA).  Of note, he will eat and drink while using his Bipap so this may have led to aspiration.  He is now back on his baseline 4 L/min oxygen by nasal cannula and bipap at night.  - Continue ceftriaxone/azithromycin (started 8/8) for possible aspiration PNA.  - Avoid eating/drinking while on Bipap.  - I will ask pulmonary to assess.  He has chronic hypercarbic/hypoxemic respiratory failure with restrictive lung disease likely from chest wall mechanics.  ?Whether we refer him somewhere for re-evaluation of pectus excavatum, would another surgery be an option.  2. Acute on chronic diastolic CHF with RV failure:  Patient presented with primarily right-sided CHF in 2010, likely due to pulmonary hypertension from a combination of left atrial compression and nocturnal hypoxemia. He is now using Bipap every night. He had a redo pectus surgery in 2011. I suspect the surgery relieved some of the compression on his lungs but followup cardiac MRI in 6/16 did show continued compression of the left atrium between the ascending and descending aorta as well as a dilated RV with RV EF  37%.  Cardiac MRI in 6/16 was very similar to the MRI in 2010. Recently, he has had increased exertional dyspnea.  Treatment for asthma exacerbation with steroids and antibiotics x 2 courses has not helped much.  While at [facility], he was diuresed with IV Lasix, now back on po Lasix.  He does not look markedly volume overloaded.  - RHC today to assess filling pressures.  I am concerned that he has significant RV failure at this point.   - Lasix 40 mg po bid pending RHC findings.  - Will give him acetazolamide 250 mg daily with elevated HCO3.    - Continue Bipap at night and oxygen during the day to lower drive for hypoxemic pulmonary vasoconstriction.     - I am going to repeat cardiac MRI to evaluate RV (echo has had poor windows in this patient).  2. 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   - Cardiac MRI to look at RV.  - RHC as above.  3. Restrictive lung disease:  Lung disease from restriction due to pectus, CTA at [facility] showed collapsed left lung. He has had chronic hypercarbic respiratory failure. This is a major player in his chronic dyspnea. He has had 2 operations already for his pectus deformity. - Will ask pulmonary to see as above for assistance with management.  ?If it would be worthwhile getting a second opinion regarding treatment of pectus.  4. Asthma: Chronic.    Loralie Champagne 04/21/2019 10:13 AM

## 2019-04-21 NOTE — Consult Note (Addendum)
Advanced Heart Failure Team Consult Note   Primary Physician: [name], PA-C PCP-Cardiologist:  Dr Aundra Dubin  Pulmonary: Dr Lake Bells   Reason for Consultation: Heart Failure   HPI:    [name] is seen today for evaluation of heart failure at the request of Dr Reesa Chew.  Mr [name] is a 38 year old with a history of  pectus excavatum s/p repair in childhood and redo pectus repair in 6/11, OSA Bipap, asthma, HF, chronic hypoxic respiratory failure on 2-3 liters continuously, and restrictive lung disease. Last echo was in 5/16 and showed normal LV EF but did not show the RV well.  He then got a CMRI for better view of the RV.  This showed moderate to severe RV dilation with mild to moderate RV systolic dysfunction, RV EF 37%.  This looked very similar to the 2010 MRI.   He was last seen in the HF clinic in July. Due to increased dyspnea and volume overload he was set up for [place] for August 11th.  Lasix was increased to 60 mg/40 mg x4 days then back to lasix 40 mg twice a day.   Last week he went to the beach and developed increased shortness of breath. Presented to the ED with A/C respiratory failure requiring intubation. There was concern for possible aspiration so rocephin an azithromycin given.  CTA negative for PE, interstitial edema and RLL infection.  Diuresed with IV lasix with improvement. Yesterday he was extubated to 4 liters [place] and transferred to [facility] fro ongoing management. Pertinent admission labs included: Creatinine 0.57, K 3.5, WBC 5.9. No CXR on arrival.    Ongoing SOB with exertion.     Review of Systems: [y] = yes, [ ]  = no    General: Weight gain [ ] ; Weight loss [ ] ; Anorexia [ ] ; Fatigue [Y ]; Fever [ ] ; Chills [ ] ; Weakness [ ]    Cardiac: Chest pain/pressure [ ] ; Resting SOB [ ] ; Exertional SOB [Y]; Orthopnea [ ] ; Pedal Edema [ ] ; Palpitations [ ] ; Syncope [ ] ; Presyncope [ ] ; Paroxysmal nocturnal dyspnea[ ]    Pulmonary: Cough [ ] ; Wheezing[ ] ; Hemoptysis[  ]; Sputum [ ] ; Snoring [ ]    GI: Vomiting[ ] ; Dysphagia[ ] ; Melena[ ] ; Hematochezia [ ] ; Heartburn[ ] ; Abdominal pain [ ] ; Constipation [ ] ; Diarrhea [ ] ; BRBPR [ ]    GU: Hematuria[ ] ; Dysuria [ ] ; Nocturia[ ]    Vascular: Pain in legs with walking [ ] ; Pain in feet with lying flat [ ] ; Non-healing sores [ ] ; Stroke [ ] ; TIA [ ] ; Slurred speech [ ] ;   Neuro: Headaches[ ] ; Vertigo[ ] ; Seizures[ ] ; Paresthesias[ ] ;Blurred vision [ ] ; Diplopia [ ] ; Vision changes [ ]    Ortho/Skin: Arthritis [ ] ; Joint pain [Y ]; Muscle pain [ ] ; Joint swelling [ ] ; Back Pain [Y ]; Rash [ ]    Psych: Depression[ Y]; Anxiety[ ]    Heme: Bleeding problems [ ] ; Clotting disorders [ ] ; Anemia [ ]    Endocrine: Diabetes [ ] ; Thyroid dysfunction[ ]   Home Medications Prior to Admission medications   Medication Sig Start Date End Date Taking? Authorizing Provider  acetaminophen (TYLENOL) 500 MG tablet Take 1,000 mg by mouth every 6 (six) hours as needed for mild pain.   Yes [provider]  albuterol (PROAIR HFA) 108 (90 BASE) MCG/ACT inhaler Inhale 2 puffs into the lungs every 6 (six) hours as needed for wheezing or shortness of breath. 04/29/14  Yes Clance, Lanny Hurst  M, MD  albuterol (PROVENTIL) (2.5 MG/3ML) 0.083% nebulizer solution Take 3 mLs (2.5 mg total) by nebulization every 6 (six) hours as needed for wheezing or shortness of breath. 08/14/18  Yes Martyn Ehrich, NP  amphetamine-dextroamphetamine (ADDERALL) 10 MG tablet Take 10 mg by mouth 4 (four) times daily as needed.   Yes [provider]  bisoprolol (ZEBETA) 5 MG tablet TAKE 1 TABLET BY MOUTH EVERY DAY 03/22/19  Yes Larey Dresser, MD  budesonide-formoterol [facility]) 80-4.5 MCG/ACT inhaler Inhale 2 puffs into the lungs 2 (two) times a day. 03/11/19  Yes Martyn Ehrich, NP  cetirizine (ZYRTEC) 10 MG tablet Take 1 tablet (10 mg total) by mouth daily. 08/18/12  Yes Clance, Armando Reichert, MD  furosemide (LASIX) 40 MG tablet TAKE 1 TAB IN AM AND  1/2 TAB IN PM Patient taking differently: Take 40 mg by mouth 2 (two) times daily.  04/06/19  Yes Larey Dresser, MD  isosorbide mononitrate (IMDUR) 30 MG 24 hr tablet TAKE 1 TABLET (30 MG TOTAL) BY MOUTH DAILY. NEED APPT FOR FUTURE REFILLS [phone] 04/06/19  Yes Larey Dresser, MD  Melatonin 10 MG TABS Take 10 mg by mouth at bedtime.   Yes [provider]  nystatin (MYCOSTATIN) 100000 UNIT/ML suspension Take 5 mLs (500,000 Units total) by mouth 4 (four) times daily. As needed for thrush symptoms 03/11/19  Yes Martyn Ehrich, NP  Omeprazole (PRILOSEC PO) Take 1 capsule by mouth 2 (two) times daily.    Yes [provider]  potassium chloride SA (KLOR-CON M20) 20 MEQ tablet TAKE 2 TABLETS BY MOUTH DAILY. NEED APPT WITH DR [facility] FOR FUTURE REFILLS [phone] Patient taking differently: Take 40 mEq by mouth daily. TAKE 2 TABLETS BY MOUTH DAILY. NEED APPT WITH DR [facility] FOR FUTURE REFILLS [phone] 03/31/19  Yes Martyn Ehrich, NP  simethicone (MYLICON) 80 MG chewable tablet Chew 80 mg by mouth as needed for flatulence.    Yes [provider]    Past Medical History: Past Medical History:  Diagnosis Date   Asthma    CHF (congestive heart failure) (HCC)    Chronic hypercapnic respiratory failure (HCC)    nocturnal  hypoxemic resp failure, on bipap   Pectus excavatum    Restrictive lung disease     Past Surgical History: Past Surgical History:  Procedure Laterality Date   KNEE SURGERY     right   PECTUS EXCAVATUM REPAIR      Family History: Family History  Problem Relation Age of Onset   Hypertension Mother    Heart attack Neg Hx    Stroke Neg Hx     Social History: Social History   Socioeconomic History   Marital status: Married    Spouse name: Not on file   Number of children: 1   Years of education: Not on file   Highest education level: Not on file  Occupational History   Occupation: sporting good Technical sales engineer strain: Not on file   Food insecurity    Worry: Not on file    Inability: Not on file   Transportation needs    Medical: Not on file    Non-medical: Not on file  Tobacco Use   Smoking status: Former Smoker    Packs/day: 0.10    Years: 1.00    Pack years: 0.10    Types: Cigarettes    Quit date: 09/09/2006    Years since quitting: 12.6  Smokeless tobacco: Never Used  Substance and Sexual Activity   Alcohol use: No    Frequency: Never   Drug use: No   Sexual activity: Not on file  Lifestyle   Physical activity    Days per week: Not on file    Minutes per session: Not on file   Stress: Not on file  Relationships   Social connections    Talks on phone: Not on file    Gets together: Not on file    Attends religious service: Not on file    Active member of club or organization: Not on file    Attends meetings of clubs or organizations: Not on file    Relationship status: Not on file  Other Topics Concern   Not on file  Social History Narrative   Not on file    Allergies:  Allergies  Allergen Reactions   Other Itching    Pet Dander    Objective:    Vital Signs:   Temp:  [97.1 F (36.2 C)-98.3 F (36.8 C)] 98.3 F (36.8 C) (08/12 0817) Pulse Rate:  [89-99] 89 (08/12 0300) Resp:  [25-32] 27 (08/12 0300) BP: (121-144)/(72-87) 144/87 (08/12 0817) SpO2:  [94 %-99 %] 97 % (08/12 0732) Weight:  [91.6 kg-92.3 kg] 92.3 kg (08/12 0300) Last BM Date: (Pt is unsure )  Weight change: Filed Weights   04/20/19 2100 04/21/19 0300  Weight: 91.6 kg 92.3 kg    Intake/Output:   Intake/Output Summary (Last 24 hours) at 04/21/2019 0906 Last data filed at 04/20/2019 2200 Gross per 24 hour  Intake 480 ml  Output --  Net 480 ml      Physical Exam    General:  NAD pectus Excavatum  HEENT: normal Neck: supple. JVP 5-6  . Carotids 2+ bilat; no bruits. No lymphadenopathy or thyromegaly appreciated. Cor: PMI nondisplaced. Regular  rate & rhythm. No rubs, gallops or murmurs. Lungs: LLL crackles.  Abdomen: soft, nontender, nondistended. No hepatosplenomegaly. No bruits or masses. Good bowel sounds. Extremities: no cyanosis, clubbing, rash, edema Neuro: alert & orientedx3, cranial nerves grossly intact. moves all 4 extremities w/o difficulty. Affect pleasant   Telemetry   SR 90s personally reviewed.   EKG   None   Labs   Basic Metabolic Panel: Recent Labs  Lab 04/21/19 0235  NA 137  K 3.5  CL 83*  CO2 45*  GLUCOSE 111*  BUN 10  CREATININE 0.57*  CALCIUM 8.4*  MG 2.1    Liver Function Tests: Recent Labs  Lab 04/21/19 0235  AST 21  ALT 23  ALKPHOS 52  BILITOT 0.9  PROT 6.3*  ALBUMIN 3.2*   No results for input(s): LIPASE, AMYLASE in the last 168 hours. No results for input(s): AMMONIA in the last 168 hours.  CBC: Recent Labs  Lab 04/21/19 0235  WBC 5.9  HGB 13.4  HCT 41.9  MCV 96.1  PLT 156    Cardiac Enzymes: No results for input(s): CKTOTAL, CKMB, CKMBINDEX, TROPONINI in the last 168 hours.  BNP: BNP (last 3 results) No results for input(s): BNP in the last 8760 hours.  ProBNP (last 3 results) Recent Labs    03/22/19 1012  PROBNP 62.0     CBG: No results for input(s): GLUCAP in the last 168 hours.  Coagulation Studies: No results for input(s): LABPROT, INR in the last 72 hours.   Imaging    No results found.   Medications:     Current Medications:  bisoprolol  5  mg Oral Daily   enoxaparin (LOVENOX) injection  40 mg Subcutaneous Q24H   furosemide  40 mg Oral BID   isosorbide mononitrate  30 mg Oral Daily   mometasone-formoterol  2 puff Inhalation BID   pantoprazole  40 mg Oral BID   potassium chloride SA  40 mEq Oral Daily   simethicone  80 mg Oral Daily   sodium chloride flush  3 mL Intravenous Q12H   sodium chloride flush  3 mL Intravenous Q12H     Infusions:  sodium chloride          Assessment/Plan   1. A/C Diastolic HF  RV Failure   Patient presented with primarily right-sided CHF in 2010, likely due to pulmonary hypertension from a combination of left atrial compression and nocturnal hypoxemia. He is now using Bipap every night. He had a redo pectus surgery in 2011. Surgery relieved some of the compression on his lungs but followup cardiac MRI in 6/16 did show continued compression of the left atrium between the ascending and descending aorta as well as a dilated RV with RV EF 37%.  Cardiac MRI in 6/16 was very similar to the MRI in 2010. Recently, he has had increased exertional dyspnea Volume status looks ok today. RHC to further assess hemodynamics. Will need repeat CMRI to reassess RV.   2. A/C Respiratory Failure / possible aspiration Intubated at [facility] and extubated on 8/11. On 4 liters with sats in th 80s   3. Pulmonary HTN   4. Restrictive Lung Disease.   Lung disease from restriction due to pectus. He has had chronic hypercarbic respiratory failure.  This is a major player in his chronic dyspnea. He has had 2 operations already for his pectus deformity.  5. Asthma   6. OSA Continue Bipap   Consult Pulmonary. RHC today to further assess hemodynamics.  Length of Stay: 1  Amy Clegg, NP  04/21/2019, 9:06 AM  Advanced Heart Failure Team Pager [phone] (M-F; 7a - 4p)  Please contact [facility] for night-coverage after hours (4p -7a ) and weekends on [email or web address]  Patient seen with NP, agree with the above note.    He has had steady worsening of dyspnea x 2 months or so.  I had increased his Lasix and planned RHC.  He went to the [place] area for vacation. He got progressively short of breath and became confused.  Went to the ER at [facility] and was found to be markedly acidotic with hypercarbic respiratory failure (7.175/148/76).  CTA chest showed no PE but the left lung was collapsed with compression from enlarged RV, there was RLL infiltrate.  Echo showed LV EF 55% with severe RV  enlargement and mild-moderate RV systolic dysfunction.  He was intubated, diuresed.  He was extubated to bipap, and is now on his baseline 4L oxygen by nasal cannula.   General: NAD MSK: Pectus excavatum Neck: JVP ?8 cm, no thyromegaly or thyroid nodule.  Lungs: Decreased BS at bases bilaterally.  CV: Nondisplaced PMI.  Heart regular S1/S2, no S3/S4, no murmur.  No peripheral edema.   Abdomen: Soft, nontender, no hepatosplenomegaly, no distention.  Skin: Intact without lesions or rashes.  Neurologic: Alert and oriented x 3.  Psych: Normal affect. Extremities: No clubbing or cyanosis.  HEENT: Normal.   1. Acute hypercarbic respiratory failure with profound acidosis: Admitted to [facility], intubated initially.  Now extubated.  CTA chest showed collapsed left lung with compression from dilated RV, no PE, RLL infiltrate (aspiration versus PNA).  Of note, he will eat and drink while using his Bipap so this may have led to aspiration.  He is now back on his baseline 4 L/min oxygen by nasal cannula and bipap at night.  - Continue ceftriaxone/azithromycin (started 8/8) for possible aspiration PNA.  - Avoid eating/drinking while on Bipap.  - I will ask pulmonary to assess.  He has chronic hypercarbic/hypoxemic respiratory failure with restrictive lung disease likely from chest wall mechanics.  ?Whether we refer him somewhere for re-evaluation of pectus excavatum, would another surgery be an option.  2. Acute on chronic diastolic CHF with RV failure:  Patient presented with primarily right-sided CHF in 2010, likely due to pulmonary hypertension from a combination of left atrial compression and nocturnal hypoxemia. He is now using Bipap every night. He had a redo pectus surgery in 2011. I suspect the surgery relieved some of the compression on his lungs but followup cardiac MRI in 6/16 did show continued compression of the left atrium between the ascending and descending aorta as well as a dilated RV with RV EF  37%.  Cardiac MRI in 6/16 was very similar to the MRI in 2010. Recently, he has had increased exertional dyspnea.  Treatment for asthma exacerbation with steroids and antibiotics x 2 courses has not helped much.  While at [facility], he was diuresed with IV Lasix, now back on po Lasix.  He does not look markedly volume overloaded.  - RHC today to assess filling pressures.  I am concerned that he has significant RV failure at this point.   - Lasix 40 mg po bid pending RHC findings.  - Will give him acetazolamide 250 mg daily with elevated HCO3.    - Continue Bipap at night and oxygen during the day to lower drive for hypoxemic pulmonary vasoconstriction.     - I am going to repeat cardiac MRI to evaluate RV (echo has had poor windows in this patient).  2. 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   - Cardiac MRI to look at RV.  - RHC as above.  3. Restrictive lung disease:  Lung disease from restriction due to pectus, CTA at [facility] showed collapsed left lung. He has had chronic hypercarbic respiratory failure. This is a major player in his chronic dyspnea. He has had 2 operations already for his pectus deformity. - Will ask pulmonary to see as above for assistance with management.  ?If it would be worthwhile getting a second opinion regarding treatment of pectus.  4. Asthma: Chronic.    Loralie Champagne 04/21/2019 10:13 AM

## 2019-04-22 ENCOUNTER — Inpatient Hospital Stay (HOSPITAL_COMMUNITY): Payer: Managed Care, Other (non HMO)

## 2019-04-22 DIAGNOSIS — J9611 Chronic respiratory failure with hypoxia: Secondary | ICD-10-CM

## 2019-04-22 DIAGNOSIS — I272 Pulmonary hypertension, unspecified: Secondary | ICD-10-CM

## 2019-04-22 DIAGNOSIS — R0602 Shortness of breath: Secondary | ICD-10-CM

## 2019-04-22 DIAGNOSIS — J9622 Acute and chronic respiratory failure with hypercapnia: Secondary | ICD-10-CM

## 2019-04-22 DIAGNOSIS — I50811 Acute right heart failure: Secondary | ICD-10-CM

## 2019-04-22 DIAGNOSIS — Q676 Pectus excavatum: Secondary | ICD-10-CM

## 2019-04-22 DIAGNOSIS — E876 Hypokalemia: Secondary | ICD-10-CM

## 2019-04-22 DIAGNOSIS — J9621 Acute and chronic respiratory failure with hypoxia: Secondary | ICD-10-CM

## 2019-04-22 DIAGNOSIS — J9612 Chronic respiratory failure with hypercapnia: Secondary | ICD-10-CM

## 2019-04-22 DIAGNOSIS — J9601 Acute respiratory failure with hypoxia: Secondary | ICD-10-CM

## 2019-04-22 LAB — BLOOD GAS, ARTERIAL
Acid-Base Excess: 18.9 mmol/L — ABNORMAL HIGH (ref 0.0–2.0)
Bicarbonate: 45.6 mmol/L — ABNORMAL HIGH (ref 20.0–28.0)
Drawn by: 535271
O2 Content: 3 L/min
O2 Saturation: 95.1 %
Patient temperature: 97.8
pCO2 arterial: 84.6 mmHg (ref 32.0–48.0)
pH, Arterial: 7.348 — ABNORMAL LOW (ref 7.350–7.450)
pO2, Arterial: 75.4 mmHg — ABNORMAL LOW (ref 83.0–108.0)

## 2019-04-22 LAB — BASIC METABOLIC PANEL
Anion gap: 10 (ref 5–15)
Anion gap: 9 (ref 5–15)
BUN: 8 mg/dL (ref 6–20)
BUN: 10 mg/dL (ref 6–20)
CO2: 43 mmol/L — ABNORMAL HIGH (ref 22–32)
CO2: 42 mmol/L — ABNORMAL HIGH (ref 22–32)
Calcium: 8.5 mg/dL — ABNORMAL LOW (ref 8.9–10.3)
Calcium: 8.4 mg/dL — ABNORMAL LOW (ref 8.9–10.3)
Chloride: 84 mmol/L — ABNORMAL LOW (ref 98–111)
Chloride: 85 mmol/L — ABNORMAL LOW (ref 98–111)
Creatinine, Ser: 0.61 mg/dL (ref 0.61–1.24)
Creatinine, Ser: 0.7 mg/dL (ref 0.61–1.24)
GFR calc Af Amer: >60 mL/min (ref >60)
GFR calc Af Amer: >60 mL/min (ref >60)
GFR calc non Af Amer: >60 mL/min (ref >60)
GFR calc non Af Amer: >60 mL/min (ref >60)
Glucose, Bld: 110 mg/dL — ABNORMAL HIGH (ref 70–99)
Glucose, Bld: 91 mg/dL (ref 70–99)
Potassium: 2.7 mmol/L — CL (ref 3.5–5.1)
Potassium: 3.6 mmol/L (ref 3.5–5.1)
Sodium: 137 mmol/L (ref 135–145)
Sodium: 136 mmol/L (ref 135–145)

## 2019-04-22 LAB — PROCALCITONIN: Procalcitonin: <0.1 ng/mL

## 2019-04-22 LAB — CBC
HCT: 44.5 % (ref 39.0–52.0)
Hemoglobin: 13.9 g/dL (ref 13.0–17.0)
MCH: 30.2 pg (ref 26.0–34.0)
MCHC: 31.2 g/dL (ref 30.0–36.0)
MCV: 96.5 fL (ref 80.0–100.0)
Platelets: 193 10*3/uL (ref 150–400)
RBC: 4.61 MIL/uL (ref 4.22–5.81)
RDW: 13.6 % (ref 11.5–15.5)
WBC: 5.7 10*3/uL (ref 4.0–10.5)
nRBC: 0 % (ref 0.0–0.2)

## 2019-04-22 LAB — MAGNESIUM: Magnesium: 1.9 mg/dL (ref 1.7–2.4)

## 2019-04-22 LAB — ECHOCARDIOGRAM LIMITED
Height: 76 in
Weight: 3216.95 oz

## 2019-04-22 MED ORDER — POTASSIUM CHLORIDE CRYS ER 20 MEQ PO TBCR
30.0000 meq | EXTENDED_RELEASE_TABLET | Freq: Once | ORAL | Status: AC
  Administered 2019-04-22: 30 meq via ORAL
  Filled 2019-04-22: qty 1

## 2019-04-22 MED ORDER — POTASSIUM CHLORIDE CRYS ER 20 MEQ PO TBCR
40.0000 meq | EXTENDED_RELEASE_TABLET | Freq: Four times a day (QID) | ORAL | Status: DC
  Administered 2019-04-22: 40 meq via ORAL
  Filled 2019-04-22: qty 2

## 2019-04-22 MED ORDER — MAGNESIUM SULFATE IN D5W 1-5 GM/100ML-% IV SOLN
1.0000 g | Freq: Once | INTRAVENOUS | Status: AC
  Administered 2019-04-22: 1 g via INTRAVENOUS
  Filled 2019-04-22: qty 100

## 2019-04-22 MED ORDER — POTASSIUM CHLORIDE CRYS ER 20 MEQ PO TBCR
40.0000 meq | EXTENDED_RELEASE_TABLET | ORAL | Status: AC
  Administered 2019-04-22 (×2): 40 meq via ORAL
  Filled 2019-04-22 (×2): qty 2

## 2019-04-22 MED ORDER — POTASSIUM CHLORIDE CRYS ER 20 MEQ PO TBCR
40.0000 meq | EXTENDED_RELEASE_TABLET | Freq: Two times a day (BID) | ORAL | Status: DC
  Administered 2019-04-23 – 2019-04-27 (×9): 40 meq via ORAL
  Filled 2019-04-22 (×9): qty 2

## 2019-04-22 MED ORDER — POTASSIUM CHLORIDE CRYS ER 20 MEQ PO TBCR
20.0000 meq | EXTENDED_RELEASE_TABLET | Freq: Once | ORAL | Status: AC
  Administered 2019-04-22: 20 meq via ORAL
  Filled 2019-04-22: qty 1

## 2019-04-22 MED ORDER — GENERIC EXTERNAL MEDICATION
Status: DC
Start: ? — End: 2019-04-22

## 2019-04-22 NOTE — Progress Notes (Signed)
  Echocardiogram 2D Echocardiogram has been performed.  [name] 04/22/2019, 2:14 PM

## 2019-04-22 NOTE — Progress Notes (Addendum)
Asked patient if he needed any help with Bipap tonight.  He stated that he could handle it himself.  I checked machine for water level and made sure that O2 was hooked up and ready for patient.  Patient had sufficient water and has 2L bled into machine.  No distress was noted, patient sats are at 100% during machine assessment.

## 2019-04-22 NOTE — Progress Notes (Signed)
CRITICAL VALUE ALERT  Critical Value:  Potassium 2.7  Date & Time Notied:  04/22/2019 at 0437  Provider Notified: K. schorr  Orders Received/Actions taken: potassium chloride 40 mEq Q 6 hrs oral.

## 2019-04-22 NOTE — Progress Notes (Signed)
PROGRESS NOTE  [name] PYP:[id number] DOB: 1981-06-03   PCP: [name], PA-C  Patient is from: Home  DOA: 04/20/2019 LOS: 2  Brief Narrative / Interim history: 38 year old with history of pectus excavatum status post repair as a child and  in 2011 at [place], [place],  diastolic CHF, pulmonary HTN, and chronic respiratory failure presenting with shortness of breath after trip to the beach.   Admitted with acute on chronic respiratory failure requiring intubation at OSH ([facility]).  CTA chest negative but concern for RLL pneumonia.  Started on diuretics and antibiotics.  Extubated to BiPAP on 04/19/2019 and transferred to [facility] for elective RHC by cardiology.  Two view CXR here without acute finding.  CT chest without contrast with severe pectus excavatum with mass-effect on heart, finding consistent with pulmonary HTN, focal LUL consolidation/mass and additional areas of scattered consolidation with regions of bronchiectasis.  Cardiac MRI revealed collapse of the left lung likely from leftward shift of heart and dilated RV, dilated main PA, markedly dilated RV with EF of 31%, moderate hypokinesis and D-shaped interventricular septum   Subjective: No major events overnight of this morning.  CT and MRI findings as above.  He has no complaint this morning.  He says his oxygen drops with minimal ambulation otherwise stable.  Still on oxygen.  He denies chest pain, GI or GU symptoms.  Objective: Vitals:   04/22/19 0750 04/22/19 0914 04/22/19 1100 04/22/19 1116  BP: 112/78   (!) 103/58  Pulse: 77   88  Resp: 20   (!) 27  Temp: 97.8 F (36.6 C)   97.8 F (36.6 C)  TempSrc: Oral   Oral  SpO2: 92% 96% 90% 91%  Weight:      Height:        Intake/Output Summary (Last 24 hours) at 04/22/2019 1148 Last data filed at 04/22/2019 0800 Gross per 24 hour  Intake 963 ml  Output 2125 ml  Net -1162 ml   Filed Weights   04/21/19 0300 04/21/19 1008 04/22/19 0214  Weight: 92.3 kg  92.6 kg 91.2 kg    Examination:  GENERAL: No acute distress.  Appears well.  HEENT: MMM.  Vision and hearing grossly intact.  NECK: Supple.  No apparent JVD.  Chest/RESP:  No IWOB. Good air movement bilaterally.  Pectus excavatum with healed scar from previous surgery. CVS:  RRR. Heart sounds normal.  ABD/GI/GU: Bowel sounds present. Soft. Non tender.  MSK/EXT:  Moves extremities. No apparent deformity or edema.  SKIN: no apparent skin lesion or wound NEURO: Awake, alert and oriented appropriately.  No gross deficit.  PSYCH: Calm. Normal affect.    I have personally reviewed the following labs and images:  Radiology Studies: Dg Chest 2 View  Result Date: 04/21/2019 CLINICAL DATA:  Congestive failure EXAM: CHEST - 2 VIEW COMPARISON:  03/22/2019 FINDINGS: Cardiac shadow is stable. Patchy infiltrates are again identified bilaterally worse on the left than the right but stable from the prior study. These are increased however from a prior exam from 2019. No new focal abnormality is noted. IMPRESSION: Stable appearance of the chest when compare with the prior exam. Given the stability this is likely of a more chronic scarring appearance. Correlation with CT of the chest may be helpful. Electronically Signed   By: Inez Catalina M.D.   On: 04/21/2019 13:37   Ct Chest Wo Contrast  Result Date: 04/21/2019 CLINICAL DATA:  Pectus excavatum EXAM: CT CHEST WITHOUT CONTRAST TECHNIQUE: Multidetector CT  imaging of the chest was performed following the standard protocol without IV contrast. COMPARISON:  CT chest May 04, 2018 FINDINGS: Cardiovascular: Evaluation of the vasculature is limited in the absence of contrast medium. There is redemonstration of marked central pulmonary arterial enlargement. The main PA measures up to 5.1 cm in maximum diameter, not significantly increased from prior. The heart itself is considerably enlarged and largely displaced towards the left chest in part due to mass effect  from patient's a significant pectus excavatum deformity. Mediastinum/Nodes: Evaluation of hilar lymph nodes is limited in the absence of contrast medium. No enlarged mediastinal or axillary lymph nodes. Thyroid gland, trachea, and esophagus demonstrate no significant findings. Lungs/Pleura: There is a region of focal area masslike consolidation in the left upper measuring 3.2 x 3.5 by 3.6 cm in size (axial 43, coronal 98). Internal air bronchograms within this opacity. Additional consolidated lung is present throughout the left hemithorax much of which is favored to reflect compressive atelectasis. Subsegmental atelectasis is present in the right lung as well. Scattered areas of bronchiectasis and architectural distortion are present in both lungs. Upper Abdomen: High attenuation in the renal pelves likely reflecting excreted contrast medium No acute abnormalities present in the visualized portions of the upper abdomen. Musculoskeletal: Straightening of the normal thoracic kyphosis with redemonstration of the significant pectus deformity of the chest as well as truncation of the right third and fourth ribs and significant deformity of the remaining ribs. Haller index measures 3.9 compatible with severe pectus deformity. With mass effect upon the heart as detailed above. No suspicious osseous lesions are identified. IMPRESSION: 1. Severe pectus excavatum deformity of the chest with mass effect upon the heart, as described above. 2. Marked central pulmonary arterial enlargement, consistent with pulmonary arterial hypertension. 3. Focal area of masslike consolidation in the left upper lobe measuring 3.2 x 3.5 x 3.6 cm in size, with internal air bronchograms. While this could represent an area of infectious or inflammatory consolidation or rounded atelectasis, underlying malignancy cannot be excluded. Consider tissue sampling versus short-term interval follow-up particularly if following a therapeutic intervention such  as antibiotics as PET would be insensitive to discerning infection versus inflammation or malignancy in this setting. 4. Additional areas of scattered consolidation with regions of bronchiectasis and architectural distortion in both lungs, could reflect a chronic infection or inflammatory process such as an organizing pneumonia given that milder findings were present on the comparison study. 5. High attenuation in the renal pelves bilaterally likely reflecting excreted contrast medium. Electronically Signed   By: Lovena Le M.D.   On: 04/21/2019 21:39   Mr Cardiac Morphology W Wo Contrast  Result Date: 04/21/2019 CLINICAL DATA:  Cor pulmonale EXAM: CARDIAC MRI TECHNIQUE: The patient was scanned on a 1.5 Tesla GE magnet. A dedicated cardiac coil was used. Functional imaging was done using Fiesta sequences. 2,3, and 4 chamber views were done to assess for RWMA's. Modified Simpson's rule using a short axis stack was used to calculate an ejection fraction on a dedicated work Conservation officer, nature. The patient received 10 cc of Gadavist. After 10 minutes inversion recovery sequences were used to assess for infiltration and scar tissue. FINDINGS: Markedly abnormal appearance of the chest cavity. Status post pectus repair. The left lung appears collapsed, compressed by the heart which is shifted leftwards. Full findings in the chest cavity will be reported with the CT chest done today. Dilated (52 mm) main pulmonary artery. Small pericardial effusion. Normal left ventricular size and wall thickness. Mild  diffuse hypokinesis with EF 52%. D-shaped interventricular septum with evidence for RV pressure/volume overload. The right ventricle was markedly dilated with moderately decreased systolic function, EF 41%. The left atrium was small and compressed between the aortic root/ascending aorta and the descending aorta/vertebral column. Severely dilated right atriuim. Mild to moderate pulmonic insufficiency. I was  unable to visualize significant tricuspid regurgitation. Trileaflet aortic valve without significant stenosis or regurgitation. On delayed enhancement imaging, there is mid-wall late gadolinium enhancement (LGE) at the inferior and anterior RV insertion sites. Measurements: LVEDV 160 mL LVSV 84 mL LVEF 52% RVEDV 471 mL RVSV 147 mL RVEF 31% IMPRESSION: 1. The left lung appears collapsed, compressed by leftwards shift of the heart and dilated RV. 2.  Dilated main pulmonary artery. 3.  Normal LV size with mild diffuse hypokinesis, EF 52%. 4. Markedly dilated RV with EF 31%, moderate hypokinesis. D-shaped interventricular septum, suggestive of RV pressure/volume overload. 5. Small left atrium appears compressed between the aortic root/ascending aorta and the descending aorta/ventrebral column. 6. Nonspecific LGE pattern at the RV insertion sites, suggestive of RV pressure/volume overload. Dalton Mclean Electronically Signed   By: Loralie Champagne M.D.   On: 04/21/2019 21:40    Microbiology: No results found for this or any previous visit (from the past 240 hour(s)).  Sepsis Labs: Invalid input(s): PROCALCITONIN, LACTICIDVEN  Urine analysis: No results found for: COLORURINE, APPEARANCEUR, LABSPEC, PHURINE, GLUCOSEU, HGBUR, BILIRUBINUR, KETONESUR, PROTEINUR, UROBILINOGEN, NITRITE, LEUKOCYTESUR  Anemia Panel: No results for input(s): VITAMINB12, FOLATE, FERRITIN, TIBC, IRON, RETICCTPCT in the last 72 hours.  Thyroid Function Tests: No results for input(s): TSH, T4TOTAL, FREET4, T3FREE, THYROIDAB in the last 72 hours.  Lipid Profile: No results for input(s): CHOL, HDL, LDLCALC, TRIG, CHOLHDL, LDLDIRECT in the last 72 hours.  CBG: No results for input(s): GLUCAP in the last 168 hours.  HbA1C: No results for input(s): HGBA1C in the last 72 hours.  BNP (last 3 results): Recent Labs    03/22/19 1012  PROBNP 62.0    Cardiac Enzymes: No results for input(s): CKTOTAL, CKMB, CKMBINDEX, TROPONINI in  the last 168 hours.  Coagulation Profile: No results for input(s): INR, PROTIME in the last 168 hours.  Liver Function Tests: Recent Labs  Lab 04/21/19 0235  AST 21  ALT 23  ALKPHOS 52  BILITOT 0.9  PROT 6.3*  ALBUMIN 3.2*   No results for input(s): LIPASE, AMYLASE in the last 168 hours. No results for input(s): AMMONIA in the last 168 hours.  Basic Metabolic Panel: Recent Labs  Lab 04/21/19 0235 04/22/19 0337  NA 137 137  K 3.5 2.7*  CL 83* 84*  CO2 45* 43*  GLUCOSE 111* 110*  BUN 10 8  CREATININE 0.57* 0.61  CALCIUM 8.4* 8.5*  MG 2.1 1.9   GFR: Estimated Creatinine Clearance: 152.2 mL/min (by C-G formula based on SCr of 0.61 mg/dL).  CBC: Recent Labs  Lab 04/21/19 0235 04/22/19 0337  WBC 5.9 5.7  HGB 13.4 13.9  HCT 41.9 44.5  MCV 96.1 96.5  PLT 156 193    Procedures:  RHC on 04/21/2019  Microbiology summarized: None here  Assessment & Plan: Acute on chronic respiratory failure with hypercarbia: On 2 to 3 L at home. 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 -Intubated at OSH and extubated on 8/10 after diuresis. -Echo with EF of 55%, abnormal septal motion, G1 DD, severe RV enlargement. -Cardiac MRI revealed collapse of the  left lung, right heart failure with EF of 31%, moderate hypokinesis -About 1.7 L urine output.  Renal function stable. -Continue diuretics and cardiac meds per cardiology.  On Diamox for metabolic alkalosis. -Daily weight, intake output and renal functions. -Encourage BiPAP -Appreciate pulmonology and cardiology input.  Metabolic alkalosis: Likely due to diuretics and hypercarbia. -Continue Diamox.  RLL pneumonia/LUL pneumonia vs. mass?  Patient has no leukocytosis or fever to suggest infectious process at this time.  Unclear if the CT finding is likely due to fluid or infectious process. -Completed 5 days of  azithromycin and 8 days of Rocephin which should be sufficient although he was not covered for anaerobic organisms in the setting of possible aspiration. -Check procalcitonin and discontinue antibiotics. -May need repeat CT in a few weeks to assess LUL mass/consolidation.  Hypokalemia: Likely due to diuretics.  Magnesium within normal range. -Replenish and recheck  DVT prophylaxis: Subcu Lovenox Code Status: Full code Family Communication: Patient and/or RN. Available if any question.  Disposition Plan: Remains inpatient.  Consultants: Cardiology and pulmonology.   Antimicrobials: Anti-infectives (From admission, onward)   Start     Dose/Rate Route Frequency Ordered Stop   04/21/19 1100  cefTRIAXone (ROCEPHIN) 1 g in sodium chloride 0.9 % 100 mL IVPB     1 g 200 mL/hr over 30 Minutes Intravenous Every 24 hours 04/21/19 1008 04/23/19 2359   04/21/19 1015  azithromycin (ZITHROMAX) tablet 250 mg  Status:  Discontinued     250 mg Oral Daily 04/21/19 1008 04/21/19 1514      Sch Meds:  Scheduled Meds: . acetaZOLAMIDE  250 mg Oral BID  . bisoprolol  5 mg Oral Daily  . enoxaparin (LOVENOX) injection  40 mg Subcutaneous Q24H  . furosemide  40 mg Intravenous BID  . isosorbide mononitrate  30 mg Oral Daily  . mometasone-formoterol  2 puff Inhalation BID  . pantoprazole  40 mg Oral BID  . potassium chloride  40 mEq Oral Q4H  . [START ON 04/23/2019] potassium chloride SA  40 mEq Oral BID  . simethicone  80 mg Oral Daily  . sodium chloride flush  3 mL Intravenous Q12H  . sodium chloride flush  3 mL Intravenous Q12H  . sodium chloride flush  3 mL Intravenous Q12H   Continuous Infusions: . sodium chloride    . cefTRIAXone (ROCEPHIN)  IV 1 g (04/22/19 1102)  . magnesium sulfate bolus IVPB     PRN Meds:.sodium chloride, albuterol, HYDROcodone-acetaminophen, ondansetron **OR** ondansetron (ZOFRAN) IV, polyethylene glycol, senna-docusate, sodium chloride flush  35 minutes with more than  50% spent in reviewing records, counseling patient and coordinating care.  Anderson Middlebrooks [address] [place]  If 7PM-7AM, please contact night-coverage [email or web address] Password [facility] 04/22/2019, 11:48 AM

## 2019-04-22 NOTE — Progress Notes (Signed)
Patient' s co2 was 110 in the ABG report. Patient is alert and oriented and breathing is normal. No distress noted. On call provider aware. Patient is on BIPAP. Will continue monitor the patient.

## 2019-04-22 NOTE — Progress Notes (Signed)
Patient ID: [name], male   DOB: 09/29/1980, 38 y.o.   MRN: [id number]     Advanced Heart Failure Rounding Note  PCP-Cardiologist: No primary care provider on file.   Subjective:    Patient was diuresed yesterday on Lasix and acetazolamide, weight down 3 lbs.  Currently wearing bipap.  ABG yesterday off Bipap showed 7.315/110/95.   RHC Procedural Findings: Hemodynamics (mmHg) RA 11 RV 50/10 PA 51/30, mean 41 PCWP mean 18 Oxygen saturations: PA 96% Cardiac Output (Fick): Unable to calculate, could not get oxygen saturation as CO2 was too high.  PAPI 1.9 CVP/PCWP 0.61  Cardiac MRI:  1. The left lung appears collapsed, compressed by leftwards shift of the heart and dilated RV. 2.  Dilated main pulmonary artery. 3.  Normal LV size with mild diffuse hypokinesis, EF 52%. 4. Markedly dilated RV with EF 31%, moderate hypokinesis. D-shaped interventricular septum, suggestive of RV pressure/volume overload. 5. Small left atrium appears compressed between the aortic root/ascending aorta and the descending aorta/ventrebral column. 6. Nonspecific LGE pattern at the RV insertion sites, suggestive of RV pressure/volume overload.  CT chest w/o contrast:  Severe pectus deformity with leftward shift of the heart.  Compressive atelectasis of left lung along with consolidation LUL.    Objective:   Weight Range: 91.2 kg Body mass index is 24.47 kg/m.   Vital Signs:   Temp:  [97.8 F (36.6 C)-98 F (36.7 C)] 97.8 F (36.6 C) (08/13 1116) Pulse Rate:  [77-91] 88 (08/13 1116) Resp:  [15-27] 27 (08/13 1116) BP: (103-118)/(58-78) 103/58 (08/13 1116) SpO2:  [88 %-96 %] 91 % (08/13 1116) Weight:  [91.2 kg] 91.2 kg (08/13 0214) Last BM Date: 04/21/19  Weight change: Filed Weights   04/21/19 0300 04/21/19 1008 04/22/19 0214  Weight: 92.3 kg 92.6 kg 91.2 kg    Intake/Output:   Intake/Output Summary (Last 24 hours) at 04/22/2019 1213 Last data filed at 04/22/2019 0800 Gross per  24 hour  Intake 963 ml  Output 2125 ml  Net -1162 ml      Physical Exam    General:  Well appearing. No resp difficulty HEENT: Normal Neck: Supple. JVP 8-9 cm. Carotids 2+ bilat; no bruits. No lymphadenopathy or thyromegaly appreciated. Cor: PMI nondisplaced. Regular rate & rhythm. No rubs, gallops or murmurs. Lungs: Clear Abdomen: Soft, nontender, nondistended. No hepatosplenomegaly. No bruits or masses. Good bowel sounds. Extremities: No cyanosis, clubbing, rash, edema Neuro: Alert & orientedx3, cranial nerves grossly intact. moves all 4 extremities w/o difficulty. Affect pleasant MSK: Pectus deformity   Telemetry   NSR 70s, personally reviewed.   Labs    CBC Recent Labs    04/21/19 0235 04/22/19 0337  WBC 5.9 5.7  HGB 13.4 13.9  HCT 41.9 44.5  MCV 96.1 96.5  PLT 156 425   Basic Metabolic Panel Recent Labs    04/21/19 0235 04/22/19 0337  NA 137 137  K 3.5 2.7*  CL 83* 84*  CO2 45* 43*  GLUCOSE 111* 110*  BUN 10 8  CREATININE 0.57* 0.61  CALCIUM 8.4* 8.5*  MG 2.1 1.9   Liver Function Tests Recent Labs    04/21/19 0235  AST 21  ALT 23  ALKPHOS 52  BILITOT 0.9  PROT 6.3*  ALBUMIN 3.2*   No results for input(s): LIPASE, AMYLASE in the last 72 hours. Cardiac Enzymes No results for input(s): CKTOTAL, CKMB, CKMBINDEX, TROPONINI in the last 72 hours.  BNP: BNP (last 3 results) No results for input(s): BNP in the  last 8760 hours.  ProBNP (last 3 results) Recent Labs    03/22/19 1012  PROBNP 62.0     D-Dimer No results for input(s): DDIMER in the last 72 hours. Hemoglobin A1C No results for input(s): HGBA1C in the last 72 hours. Fasting Lipid Panel No results for input(s): CHOL, HDL, LDLCALC, TRIG, CHOLHDL, LDLDIRECT in the last 72 hours. Thyroid Function Tests No results for input(s): TSH, T4TOTAL, T3FREE, THYROIDAB in the last 72 hours.  Invalid input(s): FREET3  Other results:   Imaging    Dg Chest 2 View  Result Date:  04/21/2019 CLINICAL DATA:  Congestive failure EXAM: CHEST - 2 VIEW COMPARISON:  03/22/2019 FINDINGS: Cardiac shadow is stable. Patchy infiltrates are again identified bilaterally worse on the left than the right but stable from the prior study. These are increased however from a prior exam from 2019. No new focal abnormality is noted. IMPRESSION: Stable appearance of the chest when compare with the prior exam. Given the stability this is likely of a more chronic scarring appearance. Correlation with CT of the chest may be helpful. Electronically Signed   By: Inez Catalina M.D.   On: 04/21/2019 13:37   Ct Chest Wo Contrast  Result Date: 04/21/2019 CLINICAL DATA:  Pectus excavatum EXAM: CT CHEST WITHOUT CONTRAST TECHNIQUE: Multidetector CT imaging of the chest was performed following the standard protocol without IV contrast. COMPARISON:  CT chest May 04, 2018 FINDINGS: Cardiovascular: Evaluation of the vasculature is limited in the absence of contrast medium. There is redemonstration of marked central pulmonary arterial enlargement. The main PA measures up to 5.1 cm in maximum diameter, not significantly increased from prior. The heart itself is considerably enlarged and largely displaced towards the left chest in part due to mass effect from patient's a significant pectus excavatum deformity. Mediastinum/Nodes: Evaluation of hilar lymph nodes is limited in the absence of contrast medium. No enlarged mediastinal or axillary lymph nodes. Thyroid gland, trachea, and esophagus demonstrate no significant findings. Lungs/Pleura: There is a region of focal area masslike consolidation in the left upper measuring 3.2 x 3.5 by 3.6 cm in size (axial 43, coronal 98). Internal air bronchograms within this opacity. Additional consolidated lung is present throughout the left hemithorax much of which is favored to reflect compressive atelectasis. Subsegmental atelectasis is present in the right lung as well. Scattered areas  of bronchiectasis and architectural distortion are present in both lungs. Upper Abdomen: High attenuation in the renal pelves likely reflecting excreted contrast medium No acute abnormalities present in the visualized portions of the upper abdomen. Musculoskeletal: Straightening of the normal thoracic kyphosis with redemonstration of the significant pectus deformity of the chest as well as truncation of the right third and fourth ribs and significant deformity of the remaining ribs. Haller index measures 3.9 compatible with severe pectus deformity. With mass effect upon the heart as detailed above. No suspicious osseous lesions are identified. IMPRESSION: 1. Severe pectus excavatum deformity of the chest with mass effect upon the heart, as described above. 2. Marked central pulmonary arterial enlargement, consistent with pulmonary arterial hypertension. 3. Focal area of masslike consolidation in the left upper lobe measuring 3.2 x 3.5 x 3.6 cm in size, with internal air bronchograms. While this could represent an area of infectious or inflammatory consolidation or rounded atelectasis, underlying malignancy cannot be excluded. Consider tissue sampling versus short-term interval follow-up particularly if following a therapeutic intervention such as antibiotics as PET would be insensitive to discerning infection versus inflammation or malignancy in this setting.  4. Additional areas of scattered consolidation with regions of bronchiectasis and architectural distortion in both lungs, could reflect a chronic infection or inflammatory process such as an organizing pneumonia given that milder findings were present on the comparison study. 5. High attenuation in the renal pelves bilaterally likely reflecting excreted contrast medium. Electronically Signed   By: Lovena Le M.D.   On: 04/21/2019 21:39   Mr Cardiac Morphology W Wo Contrast  Result Date: 04/21/2019 CLINICAL DATA:  Cor pulmonale EXAM: CARDIAC MRI TECHNIQUE:  The patient was scanned on a 1.5 Tesla GE magnet. A dedicated cardiac coil was used. Functional imaging was done using Fiesta sequences. 2,3, and 4 chamber views were done to assess for RWMA's. Modified Simpson's rule using a short axis stack was used to calculate an ejection fraction on a dedicated work Conservation officer, nature. The patient received 10 cc of Gadavist. After 10 minutes inversion recovery sequences were used to assess for infiltration and scar tissue. FINDINGS: Markedly abnormal appearance of the chest cavity. Status post pectus repair. The left lung appears collapsed, compressed by the heart which is shifted leftwards. Full findings in the chest cavity will be reported with the CT chest done today. Dilated (52 mm) main pulmonary artery. Small pericardial effusion. Normal left ventricular size and wall thickness. Mild diffuse hypokinesis with EF 52%. D-shaped interventricular septum with evidence for RV pressure/volume overload. The right ventricle was markedly dilated with moderately decreased systolic function, EF 33%. The left atrium was small and compressed between the aortic root/ascending aorta and the descending aorta/vertebral column. Severely dilated right atriuim. Mild to moderate pulmonic insufficiency. I was unable to visualize significant tricuspid regurgitation. Trileaflet aortic valve without significant stenosis or regurgitation. On delayed enhancement imaging, there is mid-wall late gadolinium enhancement (LGE) at the inferior and anterior RV insertion sites. Measurements: LVEDV 160 mL LVSV 84 mL LVEF 52% RVEDV 471 mL RVSV 147 mL RVEF 31% IMPRESSION: 1. The left lung appears collapsed, compressed by leftwards shift of the heart and dilated RV. 2.  Dilated main pulmonary artery. 3.  Normal LV size with mild diffuse hypokinesis, EF 52%. 4. Markedly dilated RV with EF 31%, moderate hypokinesis. D-shaped interventricular septum, suggestive of RV pressure/volume overload. 5. Small  left atrium appears compressed between the aortic root/ascending aorta and the descending aorta/ventrebral column. 6. Nonspecific LGE pattern at the RV insertion sites, suggestive of RV pressure/volume overload. Amanii Snethen Electronically Signed   By: Loralie Champagne M.D.   On: 04/21/2019 21:40      Medications:     Scheduled Medications: . acetaZOLAMIDE  250 mg Oral BID  . bisoprolol  5 mg Oral Daily  . enoxaparin (LOVENOX) injection  40 mg Subcutaneous Q24H  . furosemide  40 mg Intravenous BID  . isosorbide mononitrate  30 mg Oral Daily  . mometasone-formoterol  2 puff Inhalation BID  . pantoprazole  40 mg Oral BID  . potassium chloride  20 mEq Oral Once  . potassium chloride  40 mEq Oral Q4H  . [START ON 04/23/2019] potassium chloride SA  40 mEq Oral BID  . simethicone  80 mg Oral Daily  . sodium chloride flush  3 mL Intravenous Q12H  . sodium chloride flush  3 mL Intravenous Q12H  . sodium chloride flush  3 mL Intravenous Q12H     Infusions: . sodium chloride    . cefTRIAXone (ROCEPHIN)  IV 1 g (04/22/19 1102)  . magnesium sulfate bolus IVPB 1 g (04/22/19 1151)  PRN Medications:  sodium chloride, albuterol, HYDROcodone-acetaminophen, ondansetron **OR** ondansetron (ZOFRAN) IV, polyethylene glycol, senna-docusate, sodium chloride flush    Assessment/Plan   1. Acute hypercarbic respiratory failure with profound acidosis: Admitted to [facility], intubated initially.  Now extubated.  CTA chest showed collapsed left lung with compression from dilated RV, no PE, RLL infiltrate (aspiration versus PNA).  Of note, he will eat and drink while using his Bipap so this may have led to aspiration.  ABG yesterday showed compensated hypercarbic respiratory failure.  He is currently on Bipap.  CT chest here w/o contrast showed compressive atelectasis of left lung and LUL consolidation, pectus deformity shifts heart to the left.  - Currently on ceftriaxone for possible PNA, azithromycin  stopped.  - Would repeat ABG while on Bipap.  - Avoid eating/drinking while on Bipap.  - Dr. [name] to leave formal opinion today, this appears to be primarily a mechanical problem with severe pectus and leftward shift of the heart with compression of the left lung.  Would consider a second surgical opinion.  Otherwise, he may end up with a tracheostomy.  2. 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 He is now using Bipap every night. He had a redo pectus surgery in 2011. I suspect the surgery relieved some of the compression on his lungs but followup cardiac MRI in 6/16 did show continued compression of the left atrium between the ascending and descending aorta as well as a dilated RV with RV EF 37%. Cardiac MRI in 6/16 was very similar to the MRI in 2010. Recently, he has had increased exertional dyspnea. Treatment for asthma exacerbation with steroids and antibiotics x 2 courses has not helped much. While at [facility], he was diuresed with IV Lasix, now back on po Lasix.  [place] 8/12 showed elevated right and left heart filling pressures with primarily pulmonary venous hypertension.  PAPI low but not markedly low, suggesting mild-moderate RV dysfunction. The MRI repeated here showed left-shifted heart with marked RV enlargement and RVEF 31%, LV EF 52%.  The LA is compressed between ascending and descending aorta, which may explain elevated PCWP.  - Continue IV Lasix + acetazolamide for 1 more day.  - Continue Bipap at night and oxygen during the day to lower drive for hypoxemic pulmonary vasoconstriction.   2. 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  - RHC showed moderate pulmonary hypertension, PA pressure 51/30. Significant component of pulmonary venous hypertension.  There is unlikely to be a group 1 component to his PH, so not sure pulmonary vasodilators would be helpful.  3. Restrictive lung disease: Lung disease from restriction due to pectus, CTA at [facility] showed collapsed left lung. He has had chronic hypercarbic respiratory failure. This is a major player in his chronic dyspnea. He has had 2 operations already for his pectus deformity. CT here showed heart shifted left with compression of left lung.   4. Asthma:Chronic.  Length of Stay: 2  Loralie Champagne, MD  04/22/2019, 12:13 PM  Advanced Heart Failure Team Pager [phone] (M-F; [place])  Please contact [facility] for night-coverage after hours (4p -7a ) and weekends on [email or web address]

## 2019-04-23 ENCOUNTER — Inpatient Hospital Stay (HOSPITAL_COMMUNITY): Payer: Managed Care, Other (non HMO)

## 2019-04-23 DIAGNOSIS — I50812 Chronic right heart failure: Secondary | ICD-10-CM

## 2019-04-23 LAB — BASIC METABOLIC PANEL
Anion gap: 9 (ref 5–15)
BUN: 10 mg/dL (ref 6–20)
CO2: 40 mmol/L — ABNORMAL HIGH (ref 22–32)
Calcium: 8.5 mg/dL — ABNORMAL LOW (ref 8.9–10.3)
Chloride: 89 mmol/L — ABNORMAL LOW (ref 98–111)
Creatinine, Ser: 0.66 mg/dL (ref 0.61–1.24)
GFR calc Af Amer: >60 mL/min (ref >60)
GFR calc non Af Amer: >60 mL/min (ref >60)
Glucose, Bld: 128 mg/dL — ABNORMAL HIGH (ref 70–99)
Potassium: 3.1 mmol/L — ABNORMAL LOW (ref 3.5–5.1)
Sodium: 138 mmol/L (ref 135–145)

## 2019-04-23 LAB — CBC
HCT: 44.4 % (ref 39.0–52.0)
Hemoglobin: 14.3 g/dL (ref 13.0–17.0)
MCH: 30.3 pg (ref 26.0–34.0)
MCHC: 32.2 g/dL (ref 30.0–36.0)
MCV: 94.1 fL (ref 80.0–100.0)
Platelets: 193 10*3/uL (ref 150–400)
RBC: 4.72 MIL/uL (ref 4.22–5.81)
RDW: 13.5 % (ref 11.5–15.5)
WBC: 5.6 10*3/uL (ref 4.0–10.5)
nRBC: 0 % (ref 0.0–0.2)

## 2019-04-23 LAB — PHOSPHORUS: Phosphorus: 2.6 mg/dL (ref 2.5–4.6)

## 2019-04-23 LAB — MAGNESIUM: Magnesium: 2 mg/dL (ref 1.7–2.4)

## 2019-04-23 MED ORDER — POTASSIUM CHLORIDE CRYS ER 20 MEQ PO TBCR: 20.0000 meq | EXTENDED_RELEASE_TABLET | Freq: Once | ORAL | Status: DC

## 2019-04-23 MED ORDER — GENERIC EXTERNAL MEDICATION
Status: DC
Start: ? — End: 2019-04-23

## 2019-04-23 MED ORDER — LIVING BETTER WITH HEART FAILURE BOOK
Freq: Once | Status: AC
  Administered 2019-04-23: 21:00:00

## 2019-04-23 MED ORDER — POTASSIUM CHLORIDE CRYS ER 20 MEQ PO TBCR
20.0000 meq | EXTENDED_RELEASE_TABLET | Freq: Once | ORAL | Status: AC
  Administered 2019-04-23: 20 meq via ORAL
  Filled 2019-04-23: qty 1

## 2019-04-23 MED ORDER — FUROSEMIDE 40 MG PO TABS
40.0000 mg | ORAL_TABLET | Freq: Two times a day (BID) | ORAL | Status: DC
  Administered 2019-04-23 – 2019-04-27 (×8): 40 mg via ORAL
  Filled 2019-04-23 (×8): qty 1

## 2019-04-23 MED ORDER — ACETAZOLAMIDE 250 MG PO TABS
250.0000 mg | ORAL_TABLET | Freq: Every day | ORAL | Status: DC
  Administered 2019-04-23 – 2019-04-25 (×3): 250 mg via ORAL
  Filled 2019-04-23 (×4): qty 1

## 2019-04-23 MED ORDER — POTASSIUM CHLORIDE CRYS ER 20 MEQ PO TBCR: 60.0000 meq | EXTENDED_RELEASE_TABLET | Freq: Once | ORAL | Status: DC

## 2019-04-23 MED ORDER — POTASSIUM CHLORIDE CRYS ER 20 MEQ PO TBCR: 40.0000 meq | EXTENDED_RELEASE_TABLET | Freq: Once | ORAL | Status: DC

## 2019-04-23 NOTE — Progress Notes (Signed)
Patient self-manages Bipap machine, did check water level and added some for patient. Made sure O2 was working and set on machine.  Patient is ready to place on at bedtime.  No distress noted.  Patient was using IS after breathing treatment and also used his flutter as well.  Will continue to monitor.

## 2019-04-23 NOTE — Progress Notes (Signed)
PROGRESS NOTE    GIBSON LAD  JEH:[id number] DOB: December 26, 1980 DOA: 04/20/2019 PCP: [name], PA-C   Brief Narrative:  The patient is a 38 year old with history of pectus excavatum status post repair as a child and  in 2011 at [place], [place],  diastolic CHF, pulmonary HTN, and chronic respiratory failure presenting with shortness of breath after trip to the beach.   Admitted with acute on chronic respiratory failure requiring intubation at OSH ([facility]).  CTA chest negative but concern for RLL pneumonia.  Started on diuretics and antibiotics.  Extubated to BiPAP on 04/19/2019 and transferred to [facility] for elective RHC by cardiology.  Two view CXR here without acute finding.  CT chest without contrast with severe pectus excavatum with mass-effect on heart, finding consistent with pulmonary HTN, focal LUL consolidation/mass and additional areas of scattered consolidation with regions of bronchiectasis.  Cardiac MRI revealed collapse of the left lung likely from leftward shift of heart and dilated RV, dilated main PA, markedly dilated RV with EF of 31%, moderate hypokinesis and D-shaped interventricular septum  **Interim History Cardiology diuresing and pulmonary following as well.  Pulmonary recommending continuing full facemask BiPAP at night and arranging for second opinion and Dr. [name] reached out to Dr. [name] is not to redo surgeries anymore and recommends Dr. [name] [facility] who does absorbable plates.  Pulmonary will try to set the patient up with trilogy at home at night and recommending repeating ABG on 04/26/2019 in a.m. for discharge then.  In the interim we are continuing Lasix and antibiotics have now stopped for a suspected left upper lobe mass/pneumonia.  Assessment & Plan:   Principal Problem:   Pulmonary hypertension (HCC) Active Problems:   Moderate persistent asthma in adult without complication   Restrictive lung disease   Chronic respiratory  failure with hypoxia and hypercapnia (HCC)   Chronic diastolic CHF (congestive heart failure) (HCC)   Chronic right-sided CHF (congestive heart failure) (HCC)   Right heart failure (HCC)   Acute respiratory failure with hypoxia (HCC)  Acute on chronic respiratory failure with hypercarbia On 2 to 3 L at home. 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 -Intubated at OSH and extubated on 8/10 after diuresis. -Echo with EF of 55%, abnormal septal motion, G1 DD, severe RV enlargement. -Cardiac MRI revealed collapse of the left lung, right heart failure with EF of 31%, moderate hypokinesis -Currently is -3.877 L since admission  Renal function stable. -Continue diuretics and cardiac meds per cardiology.  On Diamox for metabolic alkalosis.  Cardiology changed IV Lasix to p.o. Lasix today at 40 twice daily and replacing K -Daily weight, intake output and renal functions. -Encourage BiPAP and pulmonary trying full facemask and recommending trilogy -Appreciate pulmonology and cardiology input. -Dr. [name] in process for referring the patient and likely will need to make a referral to Dr. [name] for considering a second surgical opinion given that this appears to be a primary mechanical problem with severe pectus and left upper shift of the heart with compression of the left lung -Pulmonary recommending repeating ABG on Monday -Continue to avoid eating and drinking while on BiPAP -Unclear if he could end up with a tracheostomy -Had a right heart cath on 04/21/2019 which showed moderate pulmonary hypertension -Pulmonary recommending nocturnal BiPAP and weaning FiO2 to keep saturations greater than 92% -They recommended continuing bronchodilators and inhaled steroids  but inhaled steroids have stopped and he is just now on Dulera and albuterol inhalations every 6 PRN for wheezing  shortness of breath -Continue to follow pulmonary and cardiology recommendations -Cardiology recommending continue Imdur 30 mg p.o. daily  Metabolic alkalosis and likely contraction alkalosis -Likely due to diuretics and hypercarbia. -Continue acetazolamide 250 mg p.o daily as he is getting diuresed with Lasix and now has been changed to 40 mg p.o. twice daily. -Chloride is now 89 and CO2 is now 40  RLL pneumonia/LUL pneumonia vs. mass?   -Patient has no leukocytosis or fever to suggest infectious process at this time.  Unclear if the CT finding is likely due to fluid or infectious process. -Completed 5 days of azithromycin and 8 days of Rocephin which should be sufficient although he was not covered for anaerobic organisms in the setting of possible aspiration. -Checked procalcitonin and was <0.10 and discontinued antibiotics. -May need repeat CT in a few weeks to assess LUL mass/consolidation. -Patient is afebrile and WBC is 5.6 now -Continue with Dulera  Hypokalemia -His potassium this morning was 3.1-replete  -Continue monitor replete as necessary -Repeat CMP in a.m.  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  GERD/GI prophylaxis -Continue with pantoprazole 40 mg p.o. twice daily  Hyperglycemia -Blood sugars on BMP/CMP's have been ranging from 91-120 next-check hemoglobin A1c -Continue monitor and trend blood sugars carefully and if necessary will need to place on sensitive local sliding scale insulin AC  DVT prophylaxis: Enoxaparin 40 mg sq q24h Code Status: FULL CODE  Family Communication: No family present at beside  Disposition Plan:  (specify when and where you expect patient to be discharged). Include barriers to DC in this tab.   Consultants:   Cardiology   Pulmonary    Procedures: RHC on 04/21/2019  1. Mildly elevated right and left heart filling pressures.  2. Moderate pulmonary hypertension, suspect primarily pulmonary venous hypertension.  3. PAPI low but not markedly low, suggesting mild-moderate RV dysfunction.   ECHOCARDIOGRAM IMPRESSIONS    1. The right ventricle has moderately reduced systolic function. The cavity was severely enlarged. There is no increase in right ventricular wall thickness. D-shaped interventricular septum suggestive of RV pressure/volume overload.  2. Right atrial size was severely dilated.  3. The left ventricle has low normal systolic function, with an ejection fraction of 50-55%. The cavity size was normal.  4. Limited echo for bubble study. The bubble study was negative, no evidence for PFO or ASD.  FINDINGS  Left Ventricle: The left ventricle has low normal systolic function, with an ejection fraction of 50-55%. The cavity size was normal. There is no increase in left ventricular wall thickness.    Right Ventricle: The right ventricle has moderately reduced systolic function. The cavity was severely enlarged. There is no increase in right ventricular wall thickness.  Left Atrium: Left atrial size was normal in size.  Right Atrium: Right atrial size was severely dilated. Right atrial pressure is estimated at 10 mmHg.  Interatrial Septum: Agitated saline contrast was given intravenously to evaluate for intracardiac shunting.   Antimicrobials:  Anti-infectives (From admission, onward)   Start     Dose/Rate Route  Frequency Ordered Stop   04/21/19 1100  cefTRIAXone (ROCEPHIN) 1 g in sodium chloride 0.9 % 100 mL IVPB     1 g 200 mL/hr over 30 Minutes Intravenous Every 24 hours 04/21/19 1008 04/23/19 2359   04/21/19 1015  azithromycin (ZITHROMAX) tablet 250 mg   Status:  Discontinued     250 mg Oral Daily 04/21/19 1008 04/21/19 1514     Subjective: Seen and examined at bedside states he is doing okay and he had just gotten out of the shower.  Denies chest pain, lightheadedness or dizziness. Still gets short of breath on minimal ambulation.  No other concerns or clinic this time.  Objective: Vitals:   04/23/19 1113 04/23/19 1200 04/23/19 1545 04/23/19 1600  BP: 116/73  114/67   Pulse: 90 74 83 72  Resp: 19 (!) 27 (!) 28 (!) 27  Temp:   97.7 F (36.5 C)   TempSrc:   Axillary   SpO2: 95% 97% 98% 97%  Weight:      Height:        Intake/Output Summary (Last 24 hours) at 04/23/2019 1940 Last data filed at 04/23/2019 1650 Gross per 24 hour  Intake 905 ml  Output 2700 ml  Net -1795 ml   Filed Weights   04/21/19 1008 04/22/19 0214 04/23/19 0646  Weight: 92.6 kg 91.2 kg 89.9 kg   Examination: Physical Exam:  Constitutional: WN/WD ill-appearing Caucasian male currently NAD and appears calm  Eyes: Lids and conjunctivae normal, sclerae anicteric  ENMT: External Ears, Nose appear normal. Grossly normal hearing. Mucous membranes are moist. Posterior pharynx clear of any exudate or lesions. Normal dentition.  Neck: Appears normal, supple, no cervical masses, normal ROM, no appreciable thyromegaly; no appreciable JVD Respiratory: Diminished to auscultation bilaterally but more so on the left, no wheezing, rales, rhonchi or crackles.  Wearing supplemental oxygen via nasal cannula; has a severe pectus deformity Cardiovascular: RRR, no murmurs / rubs / gallops. S1 and S2 auscultated. No extremity edema.  Abdomen: Soft, non-tender, non-distended. No masses palpated. No appreciable hepatosplenomegaly. Bowel sounds positive x4.  GU: Deferred. Musculoskeletal: No clubbing / cyanosis of digits/nails.  Has a severe pectus deformity Skin: No rashes, lesions, ulcers or limited skin evaluation. No induration; Warm and dry.  Neurologic: CN 2-12 grossly intact  with no focal deficits. Romberg sign and cerebellar reflexes not assessed.  Psychiatric: Normal judgment and insight. Alert and oriented x 3. Normal mood and appropriate affect.   Data Reviewed: I have personally reviewed following labs and imaging studies  CBC: Recent Labs  Lab 04/21/19 0235 04/22/19 0337 04/23/19 0247  WBC 5.9 5.7 5.6  HGB 13.4 13.9 14.3  HCT 41.9 44.5 44.4  MCV 96.1 96.5 94.1  PLT 156 193 811   Basic Metabolic Panel: Recent Labs  Lab 04/21/19 0235 04/22/19 0337 04/22/19 1415 04/23/19 0247 04/23/19 0948  NA 137 137 136 138  --   K 3.5 2.7* 3.6 3.1*  --   CL 83* 84* 85* 89*  --   CO2 45* 43* 42* 40*  --   GLUCOSE 111* 110* 91 128*  --   BUN 10 8 10 10   --   CREATININE 0.57* 0.61 0.70 0.66  --   CALCIUM 8.4* 8.5* 8.4* 8.5*  --   MG 2.1 1.9  --  2.0  --   PHOS  --   --   --   --  2.6   GFR: Estimated Creatinine Clearance: 152.2 mL/min (by C-G formula based on  SCr of 0.66 mg/dL). Liver Function Tests: Recent Labs  Lab 04/21/19 0235  AST 21  ALT 23  ALKPHOS 52  BILITOT 0.9  PROT 6.3*  ALBUMIN 3.2*   No results for input(s): LIPASE, AMYLASE in the last 168 hours. No results for input(s): AMMONIA in the last 168 hours. Coagulation Profile: No results for input(s): INR, PROTIME in the last 168 hours. Cardiac Enzymes: No results for input(s): CKTOTAL, CKMB, CKMBINDEX, TROPONINI in the last 168 hours. BNP (last 3 results) Recent Labs    03/22/19 1012  PROBNP 62.0   HbA1C: No results for input(s): HGBA1C in the last 72 hours. CBG: No results for input(s): GLUCAP in the last 168 hours. Lipid Profile: No results for input(s): CHOL, HDL, LDLCALC, TRIG, CHOLHDL, LDLDIRECT in the last 72 hours. Thyroid Function Tests: No results for input(s): TSH, T4TOTAL, FREET4, T3FREE, THYROIDAB in the last 72 hours. Anemia Panel: No results for input(s): VITAMINB12, FOLATE, FERRITIN, TIBC, IRON, RETICCTPCT in the last 72 hours. Sepsis Labs: Recent Labs   Lab 04/21/19 0235 04/22/19 1250  PROCALCITON 0.19 <0.10   Radiology Studies: Ct Chest Wo Contrast  Result Date: 04/21/2019 CLINICAL DATA:  Pectus excavatum EXAM: CT CHEST WITHOUT CONTRAST TECHNIQUE: Multidetector CT imaging of the chest was performed following the standard protocol without IV contrast. COMPARISON:  CT chest May 04, 2018 FINDINGS: Cardiovascular: Evaluation of the vasculature is limited in the absence of contrast medium. There is redemonstration of marked central pulmonary arterial enlargement. The main PA measures up to 5.1 cm in maximum diameter, not significantly increased from prior. The heart itself is considerably enlarged and largely displaced towards the left chest in part due to mass effect from patient's a significant pectus excavatum deformity. Mediastinum/Nodes: Evaluation of hilar lymph nodes is limited in the absence of contrast medium. No enlarged mediastinal or axillary lymph nodes. Thyroid gland, trachea, and esophagus demonstrate no significant findings. Lungs/Pleura: There is a region of focal area masslike consolidation in the left upper measuring 3.2 x 3.5 by 3.6 cm in size (axial 43, coronal 98). Internal air bronchograms within this opacity. Additional consolidated lung is present throughout the left hemithorax much of which is favored to reflect compressive atelectasis. Subsegmental atelectasis is present in the right lung as well. Scattered areas of bronchiectasis and architectural distortion are present in both lungs. Upper Abdomen: High attenuation in the renal pelves likely reflecting excreted contrast medium No acute abnormalities present in the visualized portions of the upper abdomen. Musculoskeletal: Straightening of the normal thoracic kyphosis with redemonstration of the significant pectus deformity of the chest as well as truncation of the right third and fourth ribs and significant deformity of the remaining ribs. Haller index measures 3.9 compatible  with severe pectus deformity. With mass effect upon the heart as detailed above. No suspicious osseous lesions are identified. IMPRESSION: 1. Severe pectus excavatum deformity of the chest with mass effect upon the heart, as described above. 2. Marked central pulmonary arterial enlargement, consistent with pulmonary arterial hypertension. 3. Focal area of masslike consolidation in the left upper lobe measuring 3.2 x 3.5 x 3.6 cm in size, with internal air bronchograms. While this could represent an area of infectious or inflammatory consolidation or rounded atelectasis, underlying malignancy cannot be excluded. Consider tissue sampling versus short-term interval follow-up particularly if following a therapeutic intervention such as antibiotics as PET would be insensitive to discerning infection versus inflammation or malignancy in this setting. 4. Additional areas of scattered consolidation with regions of bronchiectasis and architectural distortion  in both lungs, could reflect a chronic infection or inflammatory process such as an organizing pneumonia given that milder findings were present on the comparison study. 5. High attenuation in the renal pelves bilaterally likely reflecting excreted contrast medium. Electronically Signed   By: Lovena Le M.D.   On: 04/21/2019 21:39   Dg Chest Port 1 View  Result Date: 04/23/2019 CLINICAL DATA:  History of asthma.  Congestive heart failure. EXAM: PORTABLE CHEST 1 VIEW COMPARISON:  April 21, 2019 FINDINGS: Hazy opacity in the right mid lung persists. More dense opacity in consolidation in the left mid lower lung is stable. No pneumothorax. No other changes. IMPRESSION: Bilateral pulmonary infiltrates are stable.  No interval changes. Electronically Signed   By: Dorise Bullion III M.D   On: 04/23/2019 11:05   US Abdomen Limited Ruq  Result Date: 04/22/2019 CLINICAL DATA:  38 year old male with cirrhosis. EXAM: ULTRASOUND ABDOMEN LIMITED RIGHT UPPER QUADRANT  COMPARISON:  08/08/2017 ultrasound and prior studies FINDINGS: Gallbladder: The gallbladder is unremarkable. There is no evidence of cholelithiasis or acute cholecystitis. Common bile duct: Diameter: 2.9 cm. No intrahepatic or extrahepatic biliary dilatation. Liver: Nodular hepatic contour is compatible cirrhosis. A 1.2 cm hyperechoic lesion within the RIGHT liver is noted, not identified on the 2018 study. No other focal hepatic abnormalities are noted. Portal vein is patent on color Doppler imaging with normal direction of blood flow towards the liver. Other: None. IMPRESSION: 1. Cirrhosis with 1.2 cm hyperechoic RIGHT hepatic lesion. This is not a typical appearance for hepatocellular carcinoma and may represent a hemangioma, but as this may be new since the prior study, MRI of the abdomen with and without contrast is recommended. 2. Unremarkable gallbladder.  No biliary dilatation. Electronically Signed   By: Margarette Canada M.D.   On: 04/22/2019 16:18   Scheduled Meds: . acetaZOLAMIDE  250 mg Oral Daily  . bisoprolol  5 mg Oral Daily  . enoxaparin (LOVENOX) injection  40 mg Subcutaneous Q24H  . furosemide  40 mg Oral BID  . isosorbide mononitrate  30 mg Oral Daily  . mometasone-formoterol  2 puff Inhalation BID  . pantoprazole  40 mg Oral BID  . potassium chloride SA  40 mEq Oral BID  . simethicone  80 mg Oral Daily  . sodium chloride flush  3 mL Intravenous Q12H  . sodium chloride flush  3 mL Intravenous Q12H  . sodium chloride flush  3 mL Intravenous Q12H   Continuous Infusions: . sodium chloride    . cefTRIAXone (ROCEPHIN)  IV 1 g (04/23/19 1213)    LOS: 3 days   Kerney Elbe, DO [facility] PAGER is on [place]  If 7PM-7AM, please contact night-coverage [email or web address] Password [facility] 04/23/2019, 7:40 PM

## 2019-04-23 NOTE — Progress Notes (Signed)
NAME:  [name], MRN:  [id number], DOB:  December 23, 1980, LOS: 3 ADMISSION DATE:  04/20/2019, CONSULTATION DATE:  8/12 REFERRING MD:  Dr. Reesa Chew, CHIEF COMPLAINT:  Chronic respiratory failure   Brief History   38 year old male with restrictive lund disease in the setting of pectus excavatum transferred to [facility] 8/11 from hospital in [place] where he was admitted and intubated for pulmonary edema +/- pneumonia.  Pulmonary consulted for restrictive lung disease.  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 He has had two reconstructive surgeries for his pectus, first in childhood, and most recently in 2011 a redo at [facility]. He was re-evaluated in 2019 for further surgical options at [facility], where it was determined there were none. He is also followed by the advanced CHF clinic and has been worked up with cardiac MRI in the past, demonstrating LA compression from pectus. On last visit in 03/2019 his lasix dose was adjusted and he was scheduled for RHC on 8/11, however, he required hospitalization.   [place] the beach [place] he presented the ED ([place]) with complaints of progressive dyspnea x months. Upon presentation he was in significant respiratory distress with hypoxemic and hypercarbic failure on ABG. CTA was negative for PE, but concerning for RLL infectious process +/- pulmonary edema. He ultimately required intubation. Treated with ceftriaxone/azithromycin and diuresis and was extubated on 8/10. He was then transferred to [facility] on 8/11 for ongoing workup including RHC. PCCM consulted for chronic respiratory failure.    Past Medical History   has a past medical history of Asthma, CHF (congestive heart failure) ([place]), Chronic hypercapnic respiratory failure ([place]), Pectus  excavatum, and Restrictive lung disease.   Significant Hospital Events   8/7 > 8/10 admit to OSH for pulm edema and CAP requiring intuabtion 8/10 extubated 8/11 transfer to [facility] 8/12 [facility]  Consults:  CHF Pulmonary  Procedures:    Significant Diagnostic Tests:  [facility] 8/12 >  Micro Data:    Antimicrobials:  Ceftriaxone 8/8 > Azithromycin 8/8 > off  Interim history/subjective:    Objective   Blood pressure 107/75, pulse 80, temperature 98.1 F (36.7 C), temperature source Oral, resp. rate (!) 24, height 6\' 4"  (1.93 m), weight 89.9 kg, SpO2 99 %.        Intake/Output Summary (Last 24 hours) at 04/23/2019 1025 Last data filed at 04/23/2019 0953 Gross per 24 hour  Intake 5 ml  Output 2500 ml  Net -2495 ml   Filed Weights   04/21/19 1008 04/22/19 0214 04/23/19 0646  Weight: 92.6 kg 91.2 kg 89.9 kg    Examination: General: No acute distress.  Able speak in full sentences.  Reports breathing better. HEENT: No JVD or lymphadenopathy is appreciated Neuro: Grossly intact CV: Heart sounds are regular regular rate and rhythm, pectus excavating is noted multiple surgical scars PULM: Decreased breath sounds base GI: soft, bsx4 active  Extremities: warm/dry, negative edema  Skin: no rashes or lesions   Resolved Hospital Problem list     Assessment & Plan:   Acute on chronic hypoxemic and hypercarbic respiratory failure: Improving. He has chronic failure in the setting of restrictive lung disease due to pectus excavatum. Intubated form 8/7 to 8/10 at OSH for pulmonary edema +/- CAP improving with diuresis and  antibiotics.  Collapsed left lobe Plan  continue nocturnal BiPAP Wean FiO2 to keep sats greater than 92% Continue bronchodilators inhaled steroids Chest x-ray ordered for today 04/23/2019 Currently on ceftriaxone Patient requesting second opinion via thoracic surgery will defer to primary Add incentive spirometer and flutter valve Questionable fiberoptic  bronchoscopy if malignancy remains collapse on chest x-ray. .   Pulmonary hypertension multifactorial in the setting of chronic hypoxia and LA compression.  Status post right heart cath on 04/21/2019 with mild elevated right and left heart filling pressures.  Moderate pulmonary hypertension suspect primary pulmonary venous hypertension.  Mild to moderate RV dysfunction Aggressive diuresis per cardiology noted to be greater than 2 L negative Transition to full facemask BiPAP given instructions on appropriate time to wear sary.    Best practice:  Diet: Per Primary Pain/Anxiety/Delirium protocol (if indicated): NA VAP protocol (if indicated): NA DVT prophylaxis: Enoxaparin ppx GI prophylaxis: PPI Glucose control: NA Mobility: per primary Code Status: FULL Family Communication: Patient updated at bedside 04/23/2019 Disposition: PCU  Labs   CBC: Recent Labs  Lab 04/21/19 0235 04/22/19 0337 04/23/19 0247  WBC 5.9 5.7 5.6  HGB 13.4 13.9 14.3  HCT 41.9 44.5 44.4  MCV 96.1 96.5 94.1  PLT 156 193 048    Basic Metabolic Panel: Recent Labs  Lab 04/21/19 0235 04/22/19 0337 04/22/19 1415 04/23/19 0247  NA 137 137 136 138  K 3.5 2.7* 3.6 3.1*  CL 83* 84* 85* 89*  CO2 45* 43* 42* 40*  GLUCOSE 111* 110* 91 128*  BUN 10 8 10 10   CREATININE 0.57* 0.61 0.70 0.66  CALCIUM 8.4* 8.5* 8.4* 8.5*  MG 2.1 1.9  --  2.0   GFR: Estimated Creatinine Clearance: 152.2 mL/min (by C-G formula based on SCr of 0.66 mg/dL). Recent Labs  Lab 04/21/19 0235 04/22/19 0337 04/22/19 1250 04/23/19 0247  PROCALCITON 0.19  --  <0.10  --   WBC 5.9 5.7  --  5.6    Liver Function Tests: Recent Labs  Lab 04/21/19 0235  AST 21  ALT 23  ALKPHOS 52  BILITOT 0.9  PROT 6.3*  ALBUMIN 3.2*   No results for input(s): LIPASE, AMYLASE in the last 168 hours. No results for input(s): AMMONIA in the last 168 hours.  ABG    Component Value Date/Time   PHART 7.348 (L) 04/22/2019 1247   PCO2ART 84.6  (HH) 04/22/2019 1247   PO2ART 75.4 (L) 04/22/2019 1247   HCO3 45.6 (H) 04/22/2019 1247   TCO2 34 06/12/2010 1327   O2SAT 95.1 04/22/2019 1247     Coagulation Profile: No results for input(s): INR, PROTIME in the last 168 hours.  Cardiac Enzymes: No results for input(s): CKTOTAL, CKMB, CKMBINDEX, TROPONINI in the last 168 hours.  HbA1C: No results found for: HGBA1C  CBG: No results for input(s): GLUCAP in the last 168 hours.   Richardson Landry Sheryl Towell ACNP Maryanna Shape PCCM Pager [phone] till 1 pm If no answer page [phone] 04/23/2019, 10:25 AM

## 2019-04-23 NOTE — Progress Notes (Signed)
Patient ID: [name], male   DOB: 1980/09/27, 38 y.o.   MRN: [id number]     Advanced Heart Failure Rounding Note  PCP-Cardiologist: No primary care provider on file.   Subjective:    Patient was diuresed yesterday on Lasix and acetazolamide, weight down 3 lbs again.  Currently wearing bipap.  ABG 7.315/110/95 => repeat on bipap 7.35/65/75.  Breathing is improving. PCT < 0.1, off abx.   RHC Procedural Findings: Hemodynamics (mmHg) RA 11 RV 50/10 PA 51/30, mean 41 PCWP mean 18 Oxygen saturations: PA 96% Cardiac Output (Fick): Unable to calculate, could not get oxygen saturation as CO2 was too high.  PAPI 1.9 CVP/PCWP 0.61  Cardiac MRI:  1. The left lung appears collapsed, compressed by leftwards shift of the heart and dilated RV. 2.  Dilated main pulmonary artery. 3.  Normal LV size with mild diffuse hypokinesis, EF 52%. 4. Markedly dilated RV with EF 31%, moderate hypokinesis. D-shaped interventricular septum, suggestive of RV pressure/volume overload. 5. Small left atrium appears compressed between the aortic root/ascending aorta and the descending aorta/ventrebral column. 6. Nonspecific LGE pattern at the RV insertion sites, suggestive of RV pressure/volume overload.  CT chest w/o contrast:  Severe pectus deformity with leftward shift of the heart.  Compressive atelectasis of left lung along with consolidation LUL.   RUQ [place]: Cirrhosis, hyperechoic right liver lesion rec MRI.    Objective:   Weight Range: 89.9 kg Body mass index is 24.12 kg/m.   Vital Signs:   Temp:  [97.7 F (36.5 C)-98.1 F (36.7 C)] 98.1 F (36.7 C) (08/14 0330) Pulse Rate:  [77-92] 80 (08/14 0751) Resp:  [16-27] 24 (08/14 0751) BP: (102-113)/(58-75) 107/75 (08/14 0751) SpO2:  [90 %-100 %] 99 % (08/14 0751) Weight:  [89.9 kg] 89.9 kg (08/14 0646) Last BM Date: 04/22/19  Weight change: Filed Weights   04/21/19 1008 04/22/19 0214 04/23/19 0646  Weight: 92.6 kg 91.2 kg 89.9 kg     Intake/Output:   Intake/Output Summary (Last 24 hours) at 04/23/2019 0839 Last data filed at 04/23/2019 0646 Gross per 24 hour  Intake 0 ml  Output 2325 ml  Net -2325 ml      Physical Exam    General: NAD Neck: No JVD, no thyromegaly or thyroid nodule.  Lungs: Decreased BS on left.  CV: Pectus deformity.  Heart regular S1/S2, no S3/S4, no murmur.  No peripheral edema.   Abdomen: Soft, nontender, no hepatosplenomegaly, no distention.  Skin: Intact without lesions or rashes.  Neurologic: Alert and oriented x 3.  Psych: Normal affect. Extremities: No clubbing or cyanosis.  HEENT: Normal.   Telemetry   NSR 70s, personally reviewed.   Labs    CBC Recent Labs    04/22/19 0337 04/23/19 0247  WBC 5.7 5.6  HGB 13.9 14.3  HCT 44.5 44.4  MCV 96.5 94.1  PLT 193 062   Basic Metabolic Panel Recent Labs    04/22/19 0337 04/22/19 1415 04/23/19 0247  NA 137 136 138  K 2.7* 3.6 3.1*  CL 84* 85* 89*  CO2 43* 42* 40*  GLUCOSE 110* 91 128*  BUN 8 10 10   CREATININE 0.61 0.70 0.66  CALCIUM 8.5* 8.4* 8.5*  MG 1.9  --  2.0   Liver Function Tests Recent Labs    04/21/19 0235  AST 21  ALT 23  ALKPHOS 52  BILITOT 0.9  PROT 6.3*  ALBUMIN 3.2*   No results for input(s): LIPASE, AMYLASE in the last 72 hours. Cardiac Enzymes  No results for input(s): CKTOTAL, CKMB, CKMBINDEX, TROPONINI in the last 72 hours.  BNP: BNP (last 3 results) No results for input(s): BNP in the last 8760 hours.  ProBNP (last 3 results) Recent Labs    03/22/19 1012  PROBNP 62.0     D-Dimer No results for input(s): DDIMER in the last 72 hours. Hemoglobin A1C No results for input(s): HGBA1C in the last 72 hours. Fasting Lipid Panel No results for input(s): CHOL, HDL, LDLCALC, TRIG, CHOLHDL, LDLDIRECT in the last 72 hours. Thyroid Function Tests No results for input(s): TSH, T4TOTAL, T3FREE, THYROIDAB in the last 72 hours.  Invalid input(s): FREET3  Other results:   Imaging     US Abdomen Limited Ruq  Result Date: 04/22/2019 CLINICAL DATA:  38 year old male with cirrhosis. EXAM: ULTRASOUND ABDOMEN LIMITED RIGHT UPPER QUADRANT COMPARISON:  08/08/2017 ultrasound and prior studies FINDINGS: Gallbladder: The gallbladder is unremarkable. There is no evidence of cholelithiasis or acute cholecystitis. Common bile duct: Diameter: 2.9 cm. No intrahepatic or extrahepatic biliary dilatation. Liver: Nodular hepatic contour is compatible cirrhosis. A 1.2 cm hyperechoic lesion within the RIGHT liver is noted, not identified on the 2018 study. No other focal hepatic abnormalities are noted. Portal vein is patent on color Doppler imaging with normal direction of blood flow towards the liver. Other: None. IMPRESSION: 1. Cirrhosis with 1.2 cm hyperechoic RIGHT hepatic lesion. This is not a typical appearance for hepatocellular carcinoma and may represent a hemangioma, but as this may be new since the prior study, MRI of the abdomen with and without contrast is recommended. 2. Unremarkable gallbladder.  No biliary dilatation. Electronically Signed   By: Margarette Canada M.D.   On: 04/22/2019 16:18     Medications:     Scheduled Medications: . acetaZOLAMIDE  250 mg Oral Daily  . bisoprolol  5 mg Oral Daily  . enoxaparin (LOVENOX) injection  40 mg Subcutaneous Q24H  . furosemide  40 mg Oral BID  . isosorbide mononitrate  30 mg Oral Daily  . mometasone-formoterol  2 puff Inhalation BID  . pantoprazole  40 mg Oral BID  . potassium chloride  20 mEq Oral Once  . potassium chloride SA  40 mEq Oral BID  . potassium chloride  40 mEq Oral Once  . simethicone  80 mg Oral Daily  . sodium chloride flush  3 mL Intravenous Q12H  . sodium chloride flush  3 mL Intravenous Q12H  . sodium chloride flush  3 mL Intravenous Q12H    Infusions: . sodium chloride    . cefTRIAXone (ROCEPHIN)  IV 1 g (04/22/19 1102)    PRN Medications: sodium chloride, albuterol, HYDROcodone-acetaminophen, ondansetron  **OR** ondansetron (ZOFRAN) IV, polyethylene glycol, senna-docusate, sodium chloride flush    Assessment/Plan   1. Acute hypercarbic respiratory failure with profound acidosis: Admitted to [facility], intubated initially.  Now extubated.  CTA chest showed collapsed left lung with compression from dilated RV, no PE, RLL infiltrate (aspiration versus PNA).  Of note, he will eat and drink while using his Bipap so this may have led to aspiration.  ABG yesterday improved, compensated hypercarbic respiratory failure.  He is currently on Bipap but wears oxygen during the day mostly.  CT chest here w/o contrast showed compressive atelectasis of left lung and LUL consolidation, pectus deformity shifts heart to the left. PCT < 0.1, antibiotics stopped.  - Off antibiotics now.  - Avoid eating/drinking while on Bipap.  - Dr. [name] to leave formal opinion today, this appears to be primarily a  mechanical problem with severe pectus and leftward shift of the heart with compression of the left lung.  Would consider a second surgical opinion.  Otherwise, he may end up with a tracheostomy.  2. 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 He is now using Bipap every night. He had a redo pectus surgery in 2011. I suspect the surgery relieved some of the compression on his lungs but followup cardiac MRI in 6/16 did show continued compression of the left atrium between the ascending and descending aorta as well as a dilated RV with RV EF 37%. Cardiac MRI in 6/16 was very similar to the MRI in 2010. Recently, he has had increased exertional dyspnea. Treatment for asthma exacerbation with steroids and antibiotics x 2 courses has not helped much. While at [facility], he was diuresed with IV Lasix.  [place] 8/12 showed elevated right and left heart filling pressures with  primarily pulmonary venous hypertension.  PAPI low but not markedly low, suggesting mild-moderate RV dysfunction. The MRI repeated here showed left-shifted heart with marked RV enlargement and RVEF 31%, LV EF 52%.  The LA is compressed between ascending and descending aorta, which may explain elevated PCWP.  He diuresed well again yesterday, volume status improved/weight down 3 more lbs.  - Transition to Lasix 40 mg po bid and acetazolamide 250 mg daily.  Replace K.   - Continue Bipap at night and oxygen during the day to lower drive for hypoxemic pulmonary vasoconstriction.   2. Pulmonary hypertension: Patient likely has had a combination of secondary pulmonary hypertension from LA compression (Group 2, pulmonary venous hypertension) as well as hypoxemic pulmonary vasoconstriction from chronic hypoxemic/hypercarbic respiratory failure (Group 3).  - RHC showed moderate pulmonary hypertension, PA pressure 51/30. Significant component of pulmonary venous hypertension.  There is unlikely to be a group 1 component to his PH, so not sure pulmonary vasodilators would be helpful.  3. Restrictive lung disease: Lung disease from restriction due to pectus, CTA at [facility] showed collapsed left lung. He has had chronic hypercarbic respiratory failure. This is a major player in his chronic dyspnea. He has had 2 operations already for his pectus deformity. CT here showed heart shifted left with compression of left lung.  - See above, will need close pulmonary followup, Dr. [name] looking into a center where he can get a 2nd surgical opinion.   4. Asthma:Chronic. 5. Cirrhosis: Abdominal US showed cirrhosis.  He is not a heavy drinker.  My suspicion is that this may be related to RV failure.  - Will send viral hepatitis serologies.  - Needs MRI of liver given hyperechoic right liver lesion.   Length of Stay: 3  Loralie Champagne, MD  04/23/2019, 8:39 AM  Advanced Heart Failure Team Pager [phone] (M-F; 7a -  4p)  Please contact [facility] for night-coverage after hours (4p -7a ) and weekends on [email or web address]

## 2019-04-23 NOTE — Progress Notes (Signed)
Call from Dr Harlon Ditty at [facility]  1. She does NOT do redo surgeries anymore  2. She has 1 year waiting list  3.REcommends Dr [name] in [place] who does absorbable plates - she will connect me with him.   4 Overall feels prognosis is poor     SIGNATURE    Dr. Brand Males, M.D., F.C.C.P,  Pulmonary and Critical Care Medicine Staff Physician, [place] Director - Interstitial Lung Disease  Program  Pulmonary [place] at [place], [place], [address]  Pager: [phone], If no answer or between  15:00h - 7:00h: call [phone] Telephone: [phone]  4:50 PM 04/23/2019

## 2019-04-24 ENCOUNTER — Inpatient Hospital Stay (HOSPITAL_COMMUNITY): Payer: Managed Care, Other (non HMO)

## 2019-04-24 LAB — COMPREHENSIVE METABOLIC PANEL
ALT: 42 U/L (ref 0–44)
AST: 31 U/L (ref 15–41)
Albumin: 3.4 g/dL — ABNORMAL LOW (ref 3.5–5.0)
Alkaline Phosphatase: 52 U/L (ref 38–126)
Anion gap: 7 (ref 5–15)
BUN: 11 mg/dL (ref 6–20)
CO2: 41 mmol/L — ABNORMAL HIGH (ref 22–32)
Calcium: 8.6 mg/dL — ABNORMAL LOW (ref 8.9–10.3)
Chloride: 91 mmol/L — ABNORMAL LOW (ref 98–111)
Creatinine, Ser: 0.68 mg/dL (ref 0.61–1.24)
GFR calc Af Amer: >60 mL/min (ref >60)
GFR calc non Af Amer: >60 mL/min (ref >60)
Glucose, Bld: 111 mg/dL — ABNORMAL HIGH (ref 70–99)
Potassium: 3.3 mmol/L — ABNORMAL LOW (ref 3.5–5.1)
Sodium: 139 mmol/L (ref 135–145)
Total Bilirubin: 0.4 mg/dL (ref 0.3–1.2)
Total Protein: 6.7 g/dL (ref 6.5–8.1)

## 2019-04-24 LAB — BLOOD GAS, ARTERIAL
Acid-Base Excess: 14.4 mmol/L — ABNORMAL HIGH (ref 0.0–2.0)
Bicarbonate: 42.3 mmol/L — ABNORMAL HIGH (ref 20.0–28.0)
Drawn by: 365271
O2 Content: 3 L/min
O2 Saturation: 97.7 %
Patient temperature: 98.6
pCO2 arterial: 105 mmHg (ref 32.0–48.0)
pH, Arterial: 7.228 — ABNORMAL LOW (ref 7.350–7.450)
pO2, Arterial: 112 mmHg — ABNORMAL HIGH (ref 83.0–108.0)

## 2019-04-24 LAB — CBC WITH DIFFERENTIAL/PLATELET
Abs Immature Granulocytes: 0.02 10*3/uL (ref 0.00–0.07)
Basophils Absolute: 0 10*3/uL (ref 0.0–0.1)
Basophils Relative: 0 %
Eosinophils Absolute: 0.2 10*3/uL (ref 0.0–0.5)
Eosinophils Relative: 3 %
HCT: 43.6 % (ref 39.0–52.0)
Hemoglobin: 13.7 g/dL (ref 13.0–17.0)
Immature Granulocytes: 0 %
Lymphocytes Relative: 18 %
Lymphs Abs: 1 10*3/uL (ref 0.7–4.0)
MCH: 30 pg (ref 26.0–34.0)
MCHC: 31.4 g/dL (ref 30.0–36.0)
MCV: 95.6 fL (ref 80.0–100.0)
Monocytes Absolute: 0.8 10*3/uL (ref 0.1–1.0)
Monocytes Relative: 14 %
Neutro Abs: 3.7 10*3/uL (ref 1.7–7.7)
Neutrophils Relative %: 65 %
Platelets: 212 10*3/uL (ref 150–400)
RBC: 4.56 MIL/uL (ref 4.22–5.81)
RDW: 13.4 % (ref 11.5–15.5)
WBC: 5.8 10*3/uL (ref 4.0–10.5)
nRBC: 0 % (ref 0.0–0.2)

## 2019-04-24 LAB — PHOSPHORUS: Phosphorus: 3.4 mg/dL (ref 2.5–4.6)

## 2019-04-24 LAB — MAGNESIUM: Magnesium: 2.2 mg/dL (ref 1.7–2.4)

## 2019-04-24 LAB — HEPATITIS PANEL, ACUTE
HCV Ab: 0.1 s/co ratio (ref 0.0–0.9)
Hep A IgM: NEGATIVE
Hep B C IgM: NEGATIVE
Hepatitis B Surface Ag: NEGATIVE

## 2019-04-24 MED ORDER — POTASSIUM CHLORIDE CRYS ER 20 MEQ PO TBCR
60.0000 meq | EXTENDED_RELEASE_TABLET | Freq: Once | ORAL | Status: AC
  Administered 2019-04-24: 60 meq via ORAL
  Filled 2019-04-24: qty 3

## 2019-04-24 MED ORDER — POTASSIUM CHLORIDE CRYS ER 20 MEQ PO TBCR
20.0000 meq | EXTENDED_RELEASE_TABLET | Freq: Once | ORAL | Status: AC
  Administered 2019-04-24: 20 meq via ORAL
  Filled 2019-04-24: qty 1

## 2019-04-24 MED ORDER — SPIRONOLACTONE 12.5 MG HALF TABLET
12.5000 mg | ORAL_TABLET | Freq: Every day | ORAL | Status: DC
  Administered 2019-04-24 – 2019-04-27 (×4): 12.5 mg via ORAL
  Filled 2019-04-24 (×4): qty 1

## 2019-04-24 MED ORDER — GENERIC EXTERNAL MEDICATION
Status: DC
Start: ? — End: 2019-04-24

## 2019-04-24 NOTE — Progress Notes (Signed)
BIPAP not started at this time. Patient just finished lunch. Will check back with patient around 1:00. Patient vitals stable at this time on [place]. Patient is no distress. Responding appropriately. Family at bedside.

## 2019-04-24 NOTE — Progress Notes (Signed)
Patient sleeping, checked vitals

## 2019-04-24 NOTE — Progress Notes (Addendum)
TOC CM -referral Trilogy  TOC CM contacted [employer] rep, Ashley. He will fax paperwork to have attending complete. Will place on shadow chart 04/25/2019. Trilogy are not processed on weekend due to insurance approval needed. Jonnie Finner RN CCM Case Mgmt phone [phone]

## 2019-04-24 NOTE — Progress Notes (Addendum)
NAME:  [name], MRN:  [id number], DOB:  Oct 13, 1980, LOS: 4 ADMISSION DATE:  04/20/2019, CONSULTATION DATE:  8/12 REFERRING MD:  Dr. Reesa Chew, CHIEF COMPLAINT:  Chronic respiratory failure   Brief History   38 year old male with restrictive lund disease in the setting of pectus excavatum transferred to [facility] 8/11 from hospital in [place] where he was admitted and intubated for pulmonary edema +/- pneumonia.  Pulmonary consulted for restrictive lung disease.  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 He has had two reconstructive surgeries for his pectus, first in childhood, and most recently in 2011 a redo at [facility]. He was re-evaluated in 2019 for further surgical options at [facility], where it was determined there were none. He is also followed by the advanced CHF clinic and has been worked up with cardiac MRI in the past, demonstrating LA compression from pectus. On last visit in 03/2019 his lasix dose was adjusted and he was scheduled for RHC on 8/11, however, he required hospitalization.   [place] ([place]) with complaints of progressive dyspnea x months. Upon presentation he was in significant respiratory distress with hypoxemic and hypercarbic failure on ABG. CTA was negative for PE, but concerning for RLL infectious process +/- pulmonary edema. He ultimately required intubation. Treated with ceftriaxone/azithromycin and diuresis and was extubated on 8/10. He was then transferred to [facility] on 8/11 for ongoing workup including RHC. PCCM consulted for chronic respiratory failure.    Past Medical History   has a past medical history of Asthma, CHF (congestive heart failure) ([place]), Chronic hypercapnic respiratory failure ([place]), Pectus  excavatum, and Restrictive lung disease.   Significant Hospital Events   8/7 > 8/10 admit to OSH for pulm edema and CAP requiring intuabtion 8/10 extubated 8/11 transfer to [facility] 8/12 [facility]  Consults:  CHF Pulmonary  Procedures:    Significant Diagnostic Tests:  [facility] 8/12 >  Micro Data:    Antimicrobials:  Ceftriaxone 8/8 > Azithromycin 8/8 > off  Interim history/subjective:  Called back today for increasing PCO2. Patient denies any increased somnolence and feels at baseline. He reports scrupulous compliance with BiPAP at home as confirmed by his wife. He does report that sleeping upright is at times more comfortable.  Objective   Blood pressure 113/74, pulse 68, temperature 98.3 F (36.8 C), temperature source Oral, resp. rate (!) 25, height 6\' 4"  (1.93 m), weight 90.4 kg, SpO2 99 %.        Intake/Output Summary (Last 24 hours) at 04/24/2019 1415 Last data filed at 04/24/2019 1300 Gross per 24 hour  Intake 1382 ml  Output 1500 ml  Net -118 ml   Filed Weights   04/22/19 0214 04/23/19 0646 04/24/19 0441  Weight: 91.2 kg 89.9 kg 90.4 kg    Examination: General: No acute distress.  Able speak in full sentences.  Reports breathing better. HEENT: No JVD or lymphadenopathy is appreciated Neuro: Grossly intact CV: Heart sounds are regular regular rate and rhythm, pectus excavating is noted multiple surgical scars PULM: Decreased breath sounds at bases.  Currently on BiPAP with good facial seal. GI: soft, bsx4 active  Extremities: warm/dry, negative edema  Skin: no rashes or lesions   Resolved Hospital Problem list  Assessment & Plan:   Acute on chronic hypoxemic and hypercarbic respiratory failure: Mild increase in PCO2 he has chronic failure in the setting of restrictive lung disease due to pectus excavatum. Intubated form 8/7 to 8/10 at OSH for pulmonary edema +/- CAP improving with diuresis and antibiotics.  Collapsed left lobe Plan  I have  increased his nocturnal BiPAP to 14/6 with an increase in his average minute ventilation. Hopefully this will increase efficiency of CO2 clearance. I have suggested that he may benefit from sleeping semi-upright either in bed or recliner chair to improve the mechanical efficiency of his diaphragm. I recommend repeating his ABG in the morning and he should be good for discharge so long as he is PCO2 is slowly decreasing. It may take several days to weeks to fully correct as he is now fairly adapted to his hypercapnia.   Pulmonary hypertension multifactorial in the setting of chronic hypoxia and LA compression.  Status post right heart cath on 04/21/2019 with mild elevated right and left heart filling pressures.  Moderate pulmonary hypertension suspect primary pulmonary venous hypertension.  Mild to moderate RV dysfunction Aggressive diuresis per cardiology noted to be greater than 2 L negative Transition to full facemask BiPAP given instructions on appropriate time to wear sary.    Labs   CBC: Recent Labs  Lab 04/21/19 0235 04/22/19 0337 04/23/19 0247 04/24/19 0157  WBC 5.9 5.7 5.6 5.8  NEUTROABS  --   --   --  3.7  HGB 13.4 13.9 14.3 13.7  HCT 41.9 44.5 44.4 43.6  MCV 96.1 96.5 94.1 95.6  PLT 156 193 193 829    Basic Metabolic Panel: Recent Labs  Lab 04/21/19 0235 04/22/19 0337 04/22/19 1415 04/23/19 0247 04/23/19 0948 04/24/19 0157  NA 137 137 136 138  --  139  K 3.5 2.7* 3.6 3.1*  --  3.3*  CL 83* 84* 85* 89*  --  91*  CO2 45* 43* 42* 40*  --  41*  GLUCOSE 111* 110* 91 128*  --  111*  BUN 10 8 10 10   --  11  CREATININE 0.57* 0.61 0.70 0.66  --  0.68  CALCIUM 8.4* 8.5* 8.4* 8.5*  --  8.6*  MG 2.1 1.9  --  2.0  --  2.2  PHOS  --   --   --   --  2.6 3.4   GFR: Estimated Creatinine Clearance: 152.2 mL/min (by C-G formula based on SCr of 0.68 mg/dL). Recent Labs  Lab 04/21/19 0235 04/22/19 0337 04/22/19 1250 04/23/19 0247 04/24/19 0157  PROCALCITON 0.19  --   <0.10  --   --   WBC 5.9 5.7  --  5.6 5.8    Liver Function Tests: Recent Labs  Lab 04/21/19 0235 04/24/19 0157  AST 21 31  ALT 23 42  ALKPHOS 52 52  BILITOT 0.9 0.4  PROT 6.3* 6.7  ALBUMIN 3.2* 3.4*   No results for input(s): LIPASE, AMYLASE in the last 168 hours. No results for input(s): AMMONIA in the last 168 hours.  ABG    Component Value Date/Time   PHART 7.228 (L) 04/24/2019 1140   PCO2ART 105 (HH) 04/24/2019 1140   PO2ART 112 (H) 04/24/2019 1140   HCO3 42.3 (H) 04/24/2019 1140   TCO2 34 06/12/2010 1327   O2SAT 97.7 04/24/2019 1140     Coagulation Profile: No results for input(s): INR, PROTIME in the last 168 hours.  Cardiac Enzymes: No results for input(s): CKTOTAL, CKMB, CKMBINDEX, TROPONINI  in the last 168 hours.  HbA1C: No results found for: HGBA1C  CBG: No results for input(s): GLUCAP in the last 168 hours.  >25 min spent with greater than 50% of time in counseling and coordination of care.  Kipp Brood, MD [facility] ICU Physician [place]  Pager: [phone] Mobile: [phone] After hours: [phone].  04/24/2019, 2:19 PM

## 2019-04-24 NOTE — Progress Notes (Addendum)
Patient ID: [name], male   DOB: 13-Jul-1981, 38 y.o.   MRN: [id number]     Advanced Heart Failure Rounding Note  PCP-Cardiologist: No primary care provider on file.   Subjective:    Back on po lasix and diamox. Weight up one pound. Renal function stable. K 3.3    RHC Procedural Findings: Hemodynamics (mmHg) RA 11 RV 50/10 PA 51/30, mean 41 PCWP mean 18 Oxygen saturations: PA 96% Cardiac Output (Fick): Unable to calculate, could not get oxygen saturation as CO2 was too high.  PAPI 1.9 CVP/PCWP 0.61  Cardiac MRI:  1. The left lung appears collapsed, compressed by leftwards shift of the heart and dilated RV. 2.  Dilated main pulmonary artery. 3.  Normal LV size with mild diffuse hypokinesis, EF 52%. 4. Markedly dilated RV with EF 31%, moderate hypokinesis. D-shaped interventricular septum, suggestive of RV pressure/volume overload. 5. Small left atrium appears compressed between the aortic root/ascending aorta and the descending aorta/ventrebral column. 6. Nonspecific LGE pattern at the RV insertion sites, suggestive of RV pressure/volume overload.  CT chest w/o contrast:  Severe pectus deformity with leftward shift of the heart.  Compressive atelectasis of left lung along with consolidation LUL.   RUQ [place]: Cirrhosis, hyperechoic right liver lesion rec MRI.    Objective:   Weight Range: 90.4 kg Body mass index is 24.26 kg/m.   Vital Signs:   Temp:  [97.7 F (36.5 C)-98.3 F (36.8 C)] 97.9 F (36.6 C) (08/15 0745) Pulse Rate:  [72-90] 84 (08/15 0427) Resp:  [19-28] 20 (08/15 0427) BP: (107-122)/(67-73) 116/71 (08/15 0745) SpO2:  [86 %-99 %] 94 % (08/15 0427) Weight:  [90.4 kg] 90.4 kg (08/15 0441) Last BM Date: 04/23/19  Weight change: Filed Weights   04/22/19 0214 04/23/19 0646 04/24/19 0441  Weight: 91.2 kg 89.9 kg 90.4 kg    Intake/Output:   Intake/Output Summary (Last 24 hours) at 04/24/2019 1034 Last data filed at 04/24/2019 0852 Gross per 24  hour  Intake 1602 ml  Output 1850 ml  Net -248 ml      Physical Exam    General:  Sitting in bed . No resp difficulty HEENT: normal Neck: supple. no JVD. Carotids 2+ bilat; no bruits. No lymphadenopathy or thryomegaly appreciated. Cor: Pectus defect. PMI nondisplaced. Regular rate & rhythm. No rubs, gallops or murmurs. Lungs: clear dull on left  Abdomen: soft, nontender, nondistended. No hepatosplenomegaly. No bruits or masses. Good bowel sounds. Extremities: no cyanosis, clubbing, rash, edema Neuro: alert & orientedx3, cranial nerves grossly intact. moves all 4 extremities w/o difficulty. Affect pleasant   Telemetry   NSR 70-80s, personally reviewed.   Labs    CBC Recent Labs    04/23/19 0247 04/24/19 0157  WBC 5.6 5.8  NEUTROABS  --  3.7  HGB 14.3 13.7  HCT 44.4 43.6  MCV 94.1 95.6  PLT 193 017   Basic Metabolic Panel Recent Labs    04/23/19 0247 04/23/19 0948 04/24/19 0157  NA 138  --  139  K 3.1*  --  3.3*  CL 89*  --  91*  CO2 40*  --  41*  GLUCOSE 128*  --  111*  BUN 10  --  11  CREATININE 0.66  --  0.68  CALCIUM 8.5*  --  8.6*  MG 2.0  --  2.2  PHOS  --  2.6 3.4   Liver Function Tests Recent Labs    04/24/19 0157  AST 31  ALT 42  ALKPHOS 52  BILITOT 0.4  PROT 6.7  ALBUMIN 3.4*   No results for input(s): LIPASE, AMYLASE in the last 72 hours. Cardiac Enzymes No results for input(s): CKTOTAL, CKMB, CKMBINDEX, TROPONINI in the last 72 hours.  BNP: BNP (last 3 results) No results for input(s): BNP in the last 8760 hours.  ProBNP (last 3 results) Recent Labs    03/22/19 1012  PROBNP 62.0     D-Dimer No results for input(s): DDIMER in the last 72 hours. Hemoglobin A1C No results for input(s): HGBA1C in the last 72 hours. Fasting Lipid Panel No results for input(s): CHOL, HDL, LDLCALC, TRIG, CHOLHDL, LDLDIRECT in the last 72 hours. Thyroid Function Tests No results for input(s): TSH, T4TOTAL, T3FREE, THYROIDAB in the last 72  hours.  Invalid input(s): FREET3  Other results:   Imaging    Dg Chest Port 1 View  Result Date: 04/24/2019 CLINICAL DATA:  Shortness of breath EXAM: PORTABLE CHEST 1 VIEW COMPARISON:  Yesterday FINDINGS: Cardiomegaly with pulmonary artery enlargement. Opacified left chest which is primarily from cardiac enlargement based on recent chest CT. Streaky and airspace densities on both sides. IMPRESSION: 1. Chronic cardiomegaly and pulmonary artery dilatation. 2. Airspace disease that is stable from yesterday. Electronically Signed   By: Monte Fantasia M.D.   On: 04/24/2019 09:59   Dg Chest Port 1 View  Result Date: 04/23/2019 CLINICAL DATA:  History of asthma.  Congestive heart failure. EXAM: PORTABLE CHEST 1 VIEW COMPARISON:  April 21, 2019 FINDINGS: Hazy opacity in the right mid lung persists. More dense opacity in consolidation in the left mid lower lung is stable. No pneumothorax. No other changes. IMPRESSION: Bilateral pulmonary infiltrates are stable.  No interval changes. Electronically Signed   By: Dorise Bullion III M.D   On: 04/23/2019 11:05     Medications:     Scheduled Medications: . acetaZOLAMIDE  250 mg Oral Daily  . bisoprolol  5 mg Oral Daily  . enoxaparin (LOVENOX) injection  40 mg Subcutaneous Q24H  . furosemide  40 mg Oral BID  . isosorbide mononitrate  30 mg Oral Daily  . mometasone-formoterol  2 puff Inhalation BID  . pantoprazole  40 mg Oral BID  . potassium chloride SA  40 mEq Oral BID  . simethicone  80 mg Oral Daily  . sodium chloride flush  3 mL Intravenous Q12H  . sodium chloride flush  3 mL Intravenous Q12H  . sodium chloride flush  3 mL Intravenous Q12H    Infusions: . sodium chloride      PRN Medications: sodium chloride, albuterol, HYDROcodone-acetaminophen, ondansetron **OR** ondansetron (ZOFRAN) IV, polyethylene glycol, senna-docusate, sodium chloride flush    Assessment/Plan   1. Acute hypercarbic respiratory failure with profound  acidosis: Admitted to [facility], intubated initially.  Now extubated.  CTA chest showed collapsed left lung with compression from dilated RV, no PE, RLL infiltrate (aspiration versus PNA).  Of note, he will eat and drink while using his Bipap so this may have led to aspiration.  ABG yesterday improved, compensated hypercarbic respiratory failure.  He is currently on Bipap but wears oxygen during the day mostly.  CT chest here w/o contrast showed compressive atelectasis of left lung and LUL consolidation, pectus deformity shifts heart to the left. PCT < 0.1, antibiotics stopped.  - Off antibiotics now.  - Avoid eating/drinking while on Bipap.  - Dr. [name] has seen. Case d/w Dr Harlon Ditty at [facility] who recommended Dr [name] in [place] who does absorbable plates. Dr. [name] to  discuss with Dr. Sabra Heck  2. 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 He is now using Bipap every night. He had a redo pectus surgery in 2011. I suspect the surgery relieved some of the compression on his lungs but followup cardiac MRI in 6/16 did show continued compression of the left atrium between the ascending and descending aorta as well as a dilated RV with RV EF 37%. Cardiac MRI in 6/16 was very similar to the MRI in 2010. Recently, he has had increased exertional dyspnea. Treatment for asthma exacerbation with steroids and antibiotics x 2 courses has not helped much. While at [facility], he was diuresed with IV Lasix.  [place] 8/12 showed elevated right and left heart filling pressures with primarily pulmonary venous hypertension.  PAPI low but not markedly low, suggesting mild-moderate RV dysfunction. The MRI repeated here showed left-shifted heart with marked RV enlargement and RVEF 31%, LV EF 52%.  The LA is compressed between ascending and  descending aorta, which may explain elevated PCWP.  - Volume status stable on Lasix 40 mg po bid and acetazolamide 250 mg daily. K 3.3. Will supp. Add spiro 12.5  - Continue Bipap at night and oxygen during the day to lower drive for hypoxemic pulmonary vasoconstriction.    3. Pulmonary hypertension: Patient likely has had a combination of secondary pulmonary hypertension from LA compression (Group 2, pulmonary venous hypertension) as well as hypoxemic pulmonary vasoconstriction from chronic hypoxemic/hypercarbic respiratory failure (Group 3).  - RHC showed moderate pulmonary hypertension, PA pressure 51/30. Significant component of pulmonary venous hypertension.  There is unlikely to be a group 1 component to his PH, so not sure pulmonary vasodilators would be helpful.   3. Restrictive lung disease: Lung disease from restriction due to pectus, CTA at [facility] showed collapsed left lung. He has had chronic hypercarbic respiratory failure. This is a major player in his chronic dyspnea. He has had 2 operations already for his pectus deformity. CT here showed heart shifted left with compression of left lung.  - See above, will need close pulmonary followup, Dr. [name] looking into a center where he can get a 2nd surgical opinion.    4. Asthma:Chronic.  5. Cirrhosis: Abdominal US showed cirrhosis.  He is not a heavy drinker. Likely related to RV failure.  - Hepatitis serologies negative - Needs MRI of liver given hyperechoic right liver lesion.   6. Hypokalemia - supp   Ok for d/c from our standpoint on   Lasix 40 bid Diamox 250 daily Spiro 12.5 daily Kdur 40 daily  I spoke with Dr. [name] today. He is ok with d/c with full-face Bipap +/- Trilogy vent if stable but wants to see repeat ABG prior to d/c. He will arrange f/u as an outpatient.   Time spent 40 mins.   Length of Stay: [place], MD  04/24/2019, 10:34 AM  Advanced Heart Failure Team Pager [phone]  (M-F; 7a - 4p)  Please contact [facility] for night-coverage after hours (4p -7a ) and weekends on [email or web address]  Addendum:    ABG  7.22/105/112  Dr. [name] aware. Hold off on d/c   Glori Bickers, MD  11:59 AM

## 2019-04-24 NOTE — Progress Notes (Signed)
Pt had an abnormal ABg ,reported to Dr Alfredia Ferguson, consults sent to pulmonary team, pt to have a ventilator bipap for use now, Resp team consulted re same, and for home with trilogy bipap.Case manager Edwin Cap  contacted.

## 2019-04-24 NOTE — Progress Notes (Signed)
Critical ABG results called to RN at 11:46

## 2019-04-24 NOTE — Progress Notes (Signed)
Patient in bed resting with BIPAP machine on, awoke when RN came in room.

## 2019-04-24 NOTE — Progress Notes (Signed)
PROGRESS NOTE    [name]  UVO:[id number] DOB: 19-Apr-1981 DOA: 04/20/2019 PCP: [name], PA-C   Brief Narrative:  The patient is a 38 year old with history of pectus excavatum status post repair as a child and  in 2011 at [place], [place],  diastolic CHF, pulmonary HTN, and chronic respiratory failure presenting with shortness of breath after trip to the beach.   Admitted with acute on chronic respiratory failure requiring intubation at OSH ([place]).  CTA chest negative but concern for RLL pneumonia.  Started on diuretics and antibiotics.  Extubated to BiPAP on 04/19/2019 and transferred to [facility] for elective RHC by cardiology.  Two view CXR here without acute finding.  CT chest without contrast with severe pectus excavatum with mass-effect on heart, finding consistent with pulmonary HTN, focal LUL consolidation/mass and additional areas of scattered consolidation with regions of bronchiectasis.  Cardiac MRI revealed collapse of the left lung likely from leftward shift of heart and dilated RV, dilated main PA, markedly dilated RV with EF of 31%, moderate hypokinesis and D-shaped interventricular septum  **Interim History Cardiology diuresing and pulmonary following as well.  Pulmonary recommending continuing full facemask BiPAP at night and arranging for second opinion and Dr. [name] reached out to Dr. [name] is not to redo surgeries anymore and recommends Dr. [name] [facility] who does absorbable plates.  Pulmonary will try to set the patient up with trilogy at home at night and recommending repeating ABG on 04/26/2019 in a.m. for discharge then but ABG was repeated today and pulmonary came to evaluate are making BiPAP adjustments.  They recommend repeating ABG in the morning and if stable and decreasing CO2 can likely be discharged.  In the interim we are continuing Lasix and has been transitioned back to p.o. dosing. Antibiotics have now stopped for a suspected  left upper lobe mass/pneumonia.   Assessment & Plan:   Principal Problem:   Pulmonary hypertension (HCC) Active Problems:   Moderate persistent asthma in adult without complication   Restrictive lung disease   Chronic respiratory failure with hypoxia and hypercapnia (HCC)   Chronic diastolic CHF (congestive heart failure) (HCC)   Chronic right-sided CHF (congestive heart failure) (HCC)   Right heart failure (HCC)   Acute respiratory failure with hypoxia (HCC)  Acute on chronic respiratory failure with hypercarbia On 2 to 3 L at home. 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 -Intubated at OSH and extubated on 8/10 after diuresis. -Echo with EF of 55%, abnormal septal motion, G1 DD, severe RV enlargement. -Cardiac MRI revealed collapse of the left lung, right heart failure with EF of 31%, moderate hypokinesis -Currently is -3.835 L since admission  Renal function stable. -Continue diuretics and cardiac meds per cardiology.  On Diamox for metabolic alkalosis.  Cardiology changed IV Lasix to p.o. Lasix yesterday at 40 twice daily and replacing K -Daily weight, intake output and renal functions. -Encourage BiPAP and pulmonary trying full facemask and recommending trilogy -Appreciate pulmonology and cardiology input. -Dr. [name] in process for referring the patient and likely will need to make a referral to Dr. [name] for considering a second surgical opinion given that this appears to be a primary mechanical problem with severe pectus and left upper shift of the heart with compression of the left lung -Pulmonary recommending repeating ABG on Monday but after Dr. Haroldine Laws spoke with Dr. [name] today they  repeated it today and it showed the patient was hypercarbic again -ABG showed a pH of 7.228, PCO2 of 105, PO2 of 112, bicarbonate level 42.3, and 8 O2 saturation  of 97.7% on 3 L of nasal cannula -Continue to avoid eating and drinking while on BiPAP -Unclear if he could end up with a tracheostomy -Had a right heart cath on 04/21/2019 which showed moderate pulmonary hypertension -Pulmonary recommending nocturnal BiPAP and weaning FiO2 to keep saturations greater than 92%; will try to get the patient pathology for home -They recommended continuing bronchodilators and inhaled steroids but inhaled steroids have stopped and he is just now on Dulera and albuterol inhalations every 6 PRN for wheezing shortness of breath -Continue to follow pulmonary and cardiology recommendations -Cardiology recommending continue Imdur 30 mg p.o. daily -pulmonary reevaluated today and they have increased his nocturnal BiPAP to 14/6 with a increase in his average minute ventilation which will hopefully increase efficiency of CO2 clearance and Dr. Doyne Keel also recommended the patient sleeping in upright better recliner to help improve mechanical efficiency of his diaphragm We will repeat an ABG in the morning and if his PaCO2 is slowly decreasing and improved he could likely be discharged  Metabolic alkalosis and likely contraction alkalosis -Likely due to diuretics and hypercarbia. -Continue acetazolamide 250 mg p.o daily as he is getting diuresed with Lasix and now has been changed to 40 mg p.o. twice daily. -Chloride is now 91 and CO2 is now 41  RLL pneumonia/LUL pneumonia vs. mass?   -Patient has no leukocytosis or fever to suggest infectious process at this time.  Unclear if the CT finding is likely due to fluid or infectious process. -Completed 5 days of azithromycin and 8 days of Rocephin which should be sufficient although he was not covered for anaerobic organisms in the setting of possible aspiration. -Checked procalcitonin and was <0.10 and discontinued antibiotics. -May need repeat CT in a few weeks to assess LUL mass/consolidation. -Chest x-ray this morning showed  "Chronic cardiomegaly and pulmonary artery dilatation. Airspace disease that is stable from yesterday." -Patient is afebrile and WBC is 5.8 now -Continue with Dulera  Hypokalemia -His potassium this morning was 3.3 -Replete with p.o. potassium chloride -Continue monitor replete as necessary -Repeat CMP in a.m.  Liver Cirrhosis the setting of significant right heart failure -Mentioned on the ultrasound of the abdomen of the right upper quadrant and also showed a 1.2 cm hyperechoic right hepatic lesion which was not typical for hepatocellular carcinoma and may be a hemangioma -Acute hepatitis panel was negative -We will need an MRI of the abdomen with and without contrast but this can be deferred to being an outpatient as this is not urgent  GERD/GI prophylaxis -Continue with pantoprazole 40 mg p.o. twice daily  Hyperglycemia -Blood sugars on BMP/CMP's have been ranging from 91-120; blood sugar on CMP this morning was 111 -heck hemoglobin A1c -Continue monitor and trend blood sugars carefully and if necessary will need to place on sensitive local sliding scale insulin AC  DVT prophylaxis: Enoxaparin 40 mg sq q24h Code Status: FULL CODE  Family Communication: No family present at beside  Disposition Plan: Anticipate discharging home in next 24 to 48 hours if medically stable  Consultants:   Cardiology   Pulmonary    Procedures: [place] on 04/21/2019  1. Mildly elevated right and left heart filling pressures.  2. Moderate pulmonary hypertension, suspect primarily pulmonary venous hypertension.  3. PAPI low but not markedly low, suggesting mild-moderate RV dysfunction.  ECHOCARDIOGRAM IMPRESSIONS    1. The right ventricle has moderately reduced systolic function. The cavity was severely enlarged. There is no increase in right ventricular wall thickness. D-shaped interventricular septum suggestive of RV pressure/volume overload.  2. Right atrial size was severely dilated.  3.  The left ventricle has low normal systolic function, with an ejection fraction of 50-55%. The cavity size was normal.  4. Limited echo for bubble study. The bubble study was negative, no evidence for PFO or ASD.  FINDINGS  Left Ventricle: The left ventricle has low normal systolic function, with an ejection fraction of 50-55%. The cavity size was normal. There is no increase in left ventricular wall thickness.    Right Ventricle: The right ventricle has moderately reduced systolic function. The cavity was severely enlarged. There is no increase in right ventricular wall thickness.  Left Atrium: Left atrial size was normal in size.  Right Atrium: Right atrial size was severely dilated. Right atrial pressure is estimated at 10 mmHg.  Interatrial Septum: Agitated saline contrast was given intravenously to evaluate for intracardiac shunting.   Antimicrobials:  Anti-infectives (From admission, onward)   Start     Dose/Rate Route Frequency Ordered Stop   04/21/19 1100  cefTRIAXone (ROCEPHIN) 1 g in sodium chloride 0.9 % 100 mL IVPB     1 g 200 mL/hr over 30 Minutes Intravenous Every 24 hours 04/21/19 1008 04/23/19 2359   04/21/19 1015  azithromycin (ZITHROMAX) tablet 250 mg  Status:  Discontinued     250 mg Oral Daily 04/21/19 1008 04/21/19 1514     Subjective: Seen and examined at bedside states he is doing fine and had no complaints.  No nausea or vomiting.  No chest pain, lightheadedness or dizziness.  States he has been ambulating.  No other concerns complaints this time.  Objective: Vitals:   04/24/19 1107 04/24/19 1330 04/24/19 1525 04/24/19 1619  BP: 113/74     Pulse:  68 81   Resp:  (!) 25 (!) 21   Temp: 98.3 F (36.8 C)   97.8 F (36.6 C)  TempSrc: Oral   Oral  SpO2:  99% (!) 86%   Weight:      Height:        Intake/Output Summary (Last 24 hours) at 04/24/2019 1715 Last data filed at 04/24/2019 1424 Gross per 24 hour  Intake 942 ml  Output 900 ml  Net 42 ml    Filed Weights   04/22/19 0214 04/23/19 0646 04/24/19 0441  Weight: 91.2 kg 89.9 kg 90.4 kg   Examination: Physical Exam:  Constitutional: Well-nourished, well-developed Caucasian male currently no acute distress appears calm sitting up in bed Eyes: Lids and conjunctive are normal.  Sclera anicteric ENMT: External ears nose appear normal.  Grossly normal hearing Neck: Appears supple no JVD Respiratory: Diminished auscultation bilaterally no appreciable wheezing but did have some diminished breath sounds more so on the left compared to right.  No appreciable rales, rhonchi or crackles.  Wearing supplemental oxygen via nasal cannula.  Has a severe pectus deformity noted Cardiovascular: Regular rate and rhythm.  No appreciable murmurs, rubs, gallops.  No extremity edema noted Abdomen: Soft, nontender, nondistended.  Bowel sounds present GU: Deferred Musculoskeletal: No contractures or cyanosis.  Has a significant pectus deformity Skin: No appreciable rashes or lesions limited skin evaluation Neurologic: Cranial nerves II through XII gross intact no appreciable focal deficits.  Romberg sign cerebellar reflexes were not assessed Psychiatric: Normal mood and affect.  Intact judgment and insight.  Patient is awake, alert, oriented x3  Data Reviewed: I have personally reviewed following labs and imaging studies  CBC: Recent Labs  Lab 04/21/19 0235 04/22/19 0337 04/23/19 0247 04/24/19 0157  WBC 5.9 5.7 5.6 5.8  NEUTROABS  --   --   --  3.7  HGB 13.4 13.9 14.3 13.7  HCT 41.9 44.5 44.4 43.6  MCV 96.1 96.5 94.1 95.6  PLT 156 193 193 595   Basic Metabolic Panel: Recent Labs  Lab 04/21/19 0235 04/22/19 0337 04/22/19 1415 04/23/19 0247 04/23/19 0948 04/24/19 0157  NA 137 137 136 138  --  139  K 3.5 2.7* 3.6 3.1*  --  3.3*  CL 83* 84* 85* 89*  --  91*  CO2 45* 43* 42* 40*  --  41*  GLUCOSE 111* 110* 91 128*  --  111*  BUN 10 8 10 10   --  11  CREATININE 0.57* 0.61 0.70 0.66  --   0.68  CALCIUM 8.4* 8.5* 8.4* 8.5*  --  8.6*  MG 2.1 1.9  --  2.0  --  2.2  PHOS  --   --   --   --  2.6 3.4   GFR: Estimated Creatinine Clearance: 152.2 mL/min (by C-G formula based on SCr of 0.68 mg/dL). Liver Function Tests: Recent Labs  Lab 04/21/19 0235 04/24/19 0157  AST 21 31  ALT 23 42  ALKPHOS 52 52  BILITOT 0.9 0.4  PROT 6.3* 6.7  ALBUMIN 3.2* 3.4*   No results for input(s): LIPASE, AMYLASE in the last 168 hours. No results for input(s): AMMONIA in the last 168 hours. Coagulation Profile: No results for input(s): INR, PROTIME in the last 168 hours. Cardiac Enzymes: No results for input(s): CKTOTAL, CKMB, CKMBINDEX, TROPONINI in the last 168 hours. BNP (last 3 results) Recent Labs    03/22/19 1012  PROBNP 62.0   HbA1C: No results for input(s): HGBA1C in the last 72 hours. CBG: No results for input(s): GLUCAP in the last 168 hours. Lipid Profile: No results for input(s): CHOL, HDL, LDLCALC, TRIG, CHOLHDL, LDLDIRECT in the last 72 hours. Thyroid Function Tests: No results for input(s): TSH, T4TOTAL, FREET4, T3FREE, THYROIDAB in the last 72 hours. Anemia Panel: No results for input(s): VITAMINB12, FOLATE, FERRITIN, TIBC, IRON, RETICCTPCT in the last 72 hours. Sepsis Labs: Recent Labs  Lab 04/21/19 0235 04/22/19 1250  PROCALCITON 0.19 <0.10   Radiology Studies: Dg Chest Port 1 View  Result Date: 04/24/2019 CLINICAL DATA:  Shortness of breath EXAM: PORTABLE CHEST 1 VIEW COMPARISON:  Yesterday FINDINGS: Cardiomegaly with pulmonary artery enlargement. Opacified left chest which is primarily from cardiac enlargement based on recent chest CT. Streaky and airspace densities on both sides. IMPRESSION: 1. Chronic cardiomegaly and pulmonary artery dilatation. 2. Airspace disease that is stable from yesterday. Electronically Signed   By: Monte Fantasia M.D.   On: 04/24/2019 09:59   Dg Chest Port 1 View  Result Date: 04/23/2019 CLINICAL DATA:  History of asthma.   Congestive heart failure. EXAM: PORTABLE CHEST 1 VIEW COMPARISON:  April 21, 2019 FINDINGS: Hazy opacity in the right mid lung persists. More dense opacity in consolidation in the left mid lower lung is stable. No pneumothorax. No other changes. IMPRESSION: Bilateral pulmonary infiltrates are stable.  No interval changes. Electronically Signed   By: Dorise Bullion III M.D   On: 04/23/2019 11:05   Scheduled Meds: . acetaZOLAMIDE  250 mg Oral Daily  . bisoprolol  5 mg Oral Daily  . enoxaparin (  LOVENOX) injection  40 mg Subcutaneous Q24H  . furosemide  40 mg Oral BID  . isosorbide mononitrate  30 mg Oral Daily  . mometasone-formoterol  2 puff Inhalation BID  . pantoprazole  40 mg Oral BID  . potassium chloride SA  40 mEq Oral BID  . simethicone  80 mg Oral Daily  . sodium chloride flush  3 mL Intravenous Q12H  . sodium chloride flush  3 mL Intravenous Q12H  . sodium chloride flush  3 mL Intravenous Q12H  . spironolactone  12.5 mg Oral Daily   Continuous Infusions: . sodium chloride      LOS: 4 days   Kerney Elbe, DO [facility] PAGER is on AMION  If 7PM-7AM, please contact night-coverage [email or web address] Password [facility] 04/24/2019, 5:15 PM

## 2019-04-25 ENCOUNTER — Inpatient Hospital Stay (HOSPITAL_COMMUNITY): Payer: Managed Care, Other (non HMO)

## 2019-04-25 LAB — COMPREHENSIVE METABOLIC PANEL
ALT: 40 U/L (ref 0–44)
AST: 27 U/L (ref 15–41)
Albumin: 3.5 g/dL (ref 3.5–5.0)
Alkaline Phosphatase: 50 U/L (ref 38–126)
Anion gap: 8 (ref 5–15)
BUN: 11 mg/dL (ref 6–20)
CO2: 35 mmol/L — ABNORMAL HIGH (ref 22–32)
Calcium: 8.6 mg/dL — ABNORMAL LOW (ref 8.9–10.3)
Chloride: 93 mmol/L — ABNORMAL LOW (ref 98–111)
Creatinine, Ser: 0.64 mg/dL (ref 0.61–1.24)
GFR calc Af Amer: >60 mL/min (ref >60)
GFR calc non Af Amer: >60 mL/min (ref >60)
Glucose, Bld: 114 mg/dL — ABNORMAL HIGH (ref 70–99)
Potassium: 4.4 mmol/L (ref 3.5–5.1)
Sodium: 136 mmol/L (ref 135–145)
Total Bilirubin: 0.6 mg/dL (ref 0.3–1.2)
Total Protein: 6.4 g/dL — ABNORMAL LOW (ref 6.5–8.1)

## 2019-04-25 LAB — BLOOD GAS, ARTERIAL
Acid-Base Excess: 12.8 mmol/L — ABNORMAL HIGH (ref 0.0–2.0)
Bicarbonate: 41.2 mmol/L — ABNORMAL HIGH (ref 20.0–28.0)
Drawn by: 519031
O2 Content: 3 L/min
O2 Saturation: 94.2 %
Patient temperature: 97.7
pCO2 arterial: 110 mmHg (ref 32.0–48.0)
pH, Arterial: 7.193 — CL (ref 7.350–7.450)
pO2, Arterial: 74.2 mmHg — ABNORMAL LOW (ref 83.0–108.0)

## 2019-04-25 LAB — CBC WITH DIFFERENTIAL/PLATELET
Abs Immature Granulocytes: 0.02 10*3/uL (ref 0.00–0.07)
Basophils Absolute: 0 10*3/uL (ref 0.0–0.1)
Basophils Relative: 0 %
Eosinophils Absolute: 0.1 10*3/uL (ref 0.0–0.5)
Eosinophils Relative: 3 %
HCT: 44 % (ref 39.0–52.0)
Hemoglobin: 14 g/dL (ref 13.0–17.0)
Immature Granulocytes: 0 %
Lymphocytes Relative: 21 %
Lymphs Abs: 1 10*3/uL (ref 0.7–4.0)
MCH: 30.5 pg (ref 26.0–34.0)
MCHC: 31.8 g/dL (ref 30.0–36.0)
MCV: 95.9 fL (ref 80.0–100.0)
Monocytes Absolute: 0.6 10*3/uL (ref 0.1–1.0)
Monocytes Relative: 12 %
Neutro Abs: 2.9 10*3/uL (ref 1.7–7.7)
Neutrophils Relative %: 64 %
Platelets: 182 10*3/uL (ref 150–400)
RBC: 4.59 MIL/uL (ref 4.22–5.81)
RDW: 13.4 % (ref 11.5–15.5)
WBC: 4.6 10*3/uL (ref 4.0–10.5)
nRBC: 0 % (ref 0.0–0.2)

## 2019-04-25 LAB — PHOSPHORUS: Phosphorus: 4.1 mg/dL (ref 2.5–4.6)

## 2019-04-25 LAB — HEMOGLOBIN A1C
Hgb A1c MFr Bld: 5.4 % (ref 4.8–5.6)
Mean Plasma Glucose: 108.28 mg/dL

## 2019-04-25 LAB — HEPATITIS B SURFACE ANTIBODY, QUANTITATIVE: Hep B S AB Quant (Post): <3.1 m[IU]/mL — ABNORMAL LOW (ref >9.9)

## 2019-04-25 LAB — HCV AB W REFLEX TO QUANT PCR: HCV Ab: <0.1 s/co ratio (ref 0.0–0.9)

## 2019-04-25 LAB — HCV INTERPRETATION

## 2019-04-25 LAB — MAGNESIUM: Magnesium: 2 mg/dL (ref 1.7–2.4)

## 2019-04-25 NOTE — Plan of Care (Signed)

## 2019-04-25 NOTE — TOC Progression Note (Signed)
Transition of Care [facility]) - Progression Note    Patient Details  Name: [name] MRN: [id number] Date of Birth: Sep 24, 1980  Transition of Care [facility]) CM/SW Contact  Bartholomew Crews, RN Phone Number: [phone] 04/25/2019, 1:58 PM  Clinical Narrative:    Trilogy order form placed on shadow chart for signature. CM to fax to [place] once signed.         Expected Discharge Plan and Services                                                 Social Determinants of Health (SDOH) Interventions    Readmission Risk Interventions No flowsheet data found.

## 2019-04-25 NOTE — Progress Notes (Signed)
RT NOTES: Critical ABG results called to Dr Halford Chessman. No orders given at this time. MD states they will come assess the patient. Waiting for further orders. Will continue to monitor. Patient on 3lpm nasal cannula, no distress noted. Patient alert and oriented.

## 2019-04-25 NOTE — Progress Notes (Signed)
NAME:  [name], MRN:  [id number], DOB:  May 10, 1981, LOS: 5 ADMISSION DATE:  04/20/2019, CONSULTATION DATE:  8/12 REFERRING MD:  Dr. Reesa Chew, CHIEF COMPLAINT:  Chronic respiratory failure   Brief History   38 year old male with restrictive lund disease in the setting of pectus excavatum transferred to [facility] 8/11 from hospital in [place] where he was admitted and intubated for pulmonary edema +/- pneumonia.  Pulmonary consulted for restrictive lung disease.  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 He has had two reconstructive surgeries for his pectus, first in childhood, and most recently in 2011 a redo at [facility]. He was re-evaluated in 2019 for further surgical options at [facility], where it was determined there were none. He is also followed by the advanced CHF clinic and has been worked up with cardiac MRI in the past, demonstrating LA compression from pectus. On last visit in 03/2019 his lasix dose was adjusted and he was scheduled for RHC on 8/11, however, he required hospitalization.   [place] the beach [place] he presented the ED ([place]) with complaints of progressive dyspnea x months. Upon presentation he was in significant respiratory distress with hypoxemic and hypercarbic failure on ABG. CTA was negative for PE, but concerning for RLL infectious process +/- pulmonary edema. He ultimately required intubation. Treated with ceftriaxone/azithromycin and diuresis and was extubated on 8/10. He was then transferred to [facility] on 8/11 for ongoing workup including RHC. PCCM consulted for chronic respiratory failure.    Past Medical History   has a past medical history of Asthma, CHF (congestive heart failure) ([place]), Chronic hypercapnic respiratory failure ([place]), Pectus  excavatum, and Restrictive lung disease.   Significant Hospital Events   8/7 > 8/10 admit to OSH for pulm edema and CAP requiring intuabtion 8/10 extubated 8/11 transfer to [facility] 8/12 [facility]  Consults:  CHF Pulmonary  Procedures:    Significant Diagnostic Tests:  [facility] 8/12 >  [place] Data:    Antimicrobials:  Ceftriaxone 8/8 > Azithromycin 8/8 > off  Interim history/subjective:  Called back today for increasing PCO2. Patient denies any increased somnolence and feels at baseline. He reports scrupulous compliance with BiPAP at home as confirmed by his wife. He does report that sleeping upright is at times more comfortable.  Increasing acidosis today again despite increase in BiPAP settings  Objective   Blood pressure 116/70, pulse 95, temperature 97.9 F (36.6 C), temperature source Oral, resp. rate (!) 23, height 6\' 4"  (1.93 m), weight 92.6 kg, SpO2 100 %.    FiO2 (%):  [40 %] 40 %   Intake/Output Summary (Last 24 hours) at 04/25/2019 1157 Last data filed at 04/25/2019 1122 Gross per 24 hour  Intake 720 ml  Output 1300 ml  Net -580 ml   Filed Weights   04/23/19 0646 04/24/19 0441 04/25/19 0300  Weight: 89.9 kg 90.4 kg 92.6 kg    Examination: General: No acute distress.  Able speak in full sentences.  Reports breathing better. HEENT: No JVD or lymphadenopathy is appreciated Neuro: Grossly intact, mild asterixis CV: Heart sounds are regular regular rate and rhythm, pectus excavating is noted multiple surgical scars PULM: Decreased breath sounds at bases.  Currently on BiPAP with good facial seal. GI: soft, bsx4 active  Extremities:  warm/dry, negative edema  Skin: no rashes or lesions   Resolved Hospital Problem list     Assessment & Plan:   Acute on chronic hypoxemic and hypercarbic respiratory failure: Mild increase in PCO2 he has chronic failure in the setting of restrictive lung disease due to pectus excavatum. Intubated form 8/7 to 8/10 at OSH for  pulmonary edema +/- CAP improving with diuresis and antibiotics.  Collapsed left lobe Plan  I have increased his nocturnal BiPAP to 14/6 with an increase in his average minute ventilation. Hopefully this will increase efficiency of CO2 clearance. I have suggested that he may benefit from sleeping semi-upright either in bed or recliner chair to improve the mechanical efficiency of his diaphragm. I recommend repeating his ABG in the morning and he should be good for discharge so long as he is PCO2 is slowly decreasing. It may take several days to weeks to fully correct as he is now fairly adapted to his hypercapnia. I stopped the acetazolamide. I would recommend ambulation as deconditioning is not helping his lung mechanics.  Pulmonary hypertension multifactorial in the setting of chronic hypoxia and LA compression.  Status post right heart cath on 04/21/2019 with mild elevated right and left heart filling pressures.  Moderate pulmonary hypertension suspect primary pulmonary venous hypertension.  Mild to moderate RV dysfunction Aggressive diuresis per cardiology noted to be greater than 2 L negative Transition to full facemask BiPAP given instructions on appropriate time to wear sary.    Labs   CBC: Recent Labs  Lab 04/21/19 0235 04/22/19 0337 04/23/19 0247 04/24/19 0157 04/25/19 0238  WBC 5.9 5.7 5.6 5.8 4.6  NEUTROABS  --   --   --  3.7 2.9  HGB 13.4 13.9 14.3 13.7 14.0  HCT 41.9 44.5 44.4 43.6 44.0  MCV 96.1 96.5 94.1 95.6 95.9  PLT 156 193 193 212 616    Basic Metabolic Panel: Recent Labs  Lab 04/21/19 0235 04/22/19 0337 04/22/19 1415 04/23/19 0247 04/23/19 0948 04/24/19 0157 04/25/19 0238  NA 137 137 136 138  --  139 136  K 3.5 2.7* 3.6 3.1*  --  3.3* 4.4  CL 83* 84* 85* 89*  --  91* 93*  CO2 45* 43* 42* 40*  --  41* 35*  GLUCOSE 111* 110* 91 128*  --  111* 114*  BUN 10 8 10 10   --  11 11  CREATININE 0.57* 0.61 0.70 0.66  --  0.68 0.64  CALCIUM 8.4* 8.5* 8.4* 8.5*   --  8.6* 8.6*  MG 2.1 1.9  --  2.0  --  2.2 2.0  PHOS  --   --   --   --  2.6 3.4 4.1   GFR: Estimated Creatinine Clearance: 152.2 mL/min (by C-G formula based on SCr of 0.64 mg/dL). Recent Labs  Lab 04/21/19 0235 04/22/19 0337 04/22/19 1250 04/23/19 0247 04/24/19 0157 04/25/19 0238  PROCALCITON 0.19  --  <0.10  --   --   --   WBC 5.9 5.7  --  5.6 5.8 4.6    Liver Function Tests: Recent Labs  Lab 04/21/19 0235 04/24/19 0157 04/25/19 0238  AST 21 31 27   ALT 23 42 40  ALKPHOS 52 52 50  BILITOT 0.9 0.4 0.6  PROT 6.3* 6.7 6.4*  ALBUMIN 3.2* 3.4* 3.5   No results for input(s): LIPASE, AMYLASE in the last 168 hours. No results for input(s): AMMONIA in the last 168 hours.  ABG    Component Value Date/Time  PHART 7.193 (LL) 04/25/2019 0719   PCO2ART 110 (HH) 04/25/2019 0719   PO2ART 74.2 (L) 04/25/2019 0719   HCO3 41.2 (H) 04/25/2019 0719   TCO2 34 06/12/2010 1327   O2SAT 94.2 04/25/2019 0719     Coagulation Profile: No results for input(s): INR, PROTIME in the last 168 hours.  Cardiac Enzymes: No results for input(s): CKTOTAL, CKMB, CKMBINDEX, TROPONINI in the last 168 hours.  HbA1C: Hgb A1c MFr Bld  Date/Time Value Ref Range Status  04/25/2019 02:38 AM 5.4 4.8 - 5.6 % Final    Comment:    (NOTE) Pre diabetes:          5.7%-6.4% Diabetes:              >6.4% Glycemic control for   <7.0% adults with diabetes     CBG: No results for input(s): GLUCAP in the last 168 hours.  >15 min spent with greater than 50% of time in counseling and coordination of care.  Kipp Brood, MD [facility] ICU Physician [place]  Pager: [phone] Mobile: [phone] After hours: [phone].  04/25/2019, 11:57 AM

## 2019-04-25 NOTE — Progress Notes (Signed)
PROGRESS NOTE    [name]  XBJ:[id number] DOB: [birth year] DOA: 04/20/2019 PCP: [name], PA-C   Brief Narrative:  The patient is a 38 year old with history of pectus excavatum status post repair as a child and  in 2011 at [place], [place],  diastolic CHF, pulmonary HTN, and chronic respiratory failure presenting with shortness of breath after trip to the beach.   Admitted with acute on chronic respiratory failure requiring intubation at OSH ([place]).  CTA chest negative but concern for RLL pneumonia.  Started on diuretics and antibiotics.  Extubated to BiPAP on 04/19/2019 and transferred to [facility] for elective RHC by cardiology.  Two view CXR here without acute finding.  CT chest without contrast with severe pectus excavatum with mass-effect on heart, finding consistent with pulmonary HTN, focal LUL consolidation/mass and additional areas of scattered consolidation with regions of bronchiectasis.  Cardiac MRI revealed collapse of the left lung likely from leftward shift of heart and dilated RV, dilated main PA, markedly dilated RV with EF of 31%, moderate hypokinesis and D-shaped interventricular septum  **Interim History Cardiology diuresing and pulmonary following as well.  Pulmonary recommending continuing full facemask BiPAP at night and arranging for second opinion and Dr. [name] reached out to Dr. [name] is not to redo surgeries anymore and recommends Dr. [name] [facility] who does absorbable plates.  Pulmonary will try to set the patient up with trilogy at home at night and recommending repeating ABG on 04/26/2019 in a.m. for discharge then but ABG was repeated yesterday and pulmonary came to evaluate are making BiPAP adjustments.  They recommend repeating ABG in the morning and if stable and decreasing CO2 can likely be discharged however patient CO2 was again elevated.  Dr. [name] came to evaluate and stop his acetazolamide and recommended the patient  ambulate.  Will repeat a ABG and again in the morning and await for pulmonary clearance prior to discharge.  Assessment & Plan:   Principal Problem:   Pulmonary hypertension (HCC) Active Problems:   Moderate persistent asthma in adult without complication   Restrictive lung disease   Chronic respiratory failure with hypoxia and hypercapnia (HCC)   Chronic diastolic CHF (congestive heart failure) (HCC)   Chronic right-sided CHF (congestive heart failure) (HCC)   Right heart failure (HCC)   Acute respiratory failure with hypoxia (HCC)   Acute on chronic respiratory failure with hypercarbia On 2 to 3 L at home. 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 -Intubated at OSH and extubated on 8/10 after diuresis. -Echo with EF of 55%, abnormal septal motion, G1 DD, severe RV enlargement. -Cardiac MRI revealed collapse of the left lung, right heart failure with EF of 31%, moderate hypokinesis -Currently is -3.835 L since admission  Renal function stable. -Continue diuretics and cardiac meds per cardiology.  On Diamox for metabolic alkalosis.  Cardiology changed IV Lasix to p.o. Lasix yesterday at 40 twice daily and replacing K -Daily weight, intake output and renal functions. -Encourage BiPAP and pulmonary trying full facemask and recommending trilogy -Appreciate pulmonology and cardiology input. -Dr. [name] in process for referring the patient and likely will need to make a referral to Dr. [name] for considering a second surgical opinion given that this appears to be a primary mechanical problem with severe pectus and left upper shift of the heart with compression of the left lung -Pulmonary recommending repeating ABG  on Monday but after Dr. Haroldine Laws spoke with Dr. [name] today they repeated it today and it showed the patient was hypercarbic again -ABG yesterday showed  a pH of 7.228, PCO2 of 105, PO2 of 112, bicarbonate level 42.3, and 8 O2 saturation of 97.7% on 3 L of nasal cannula -Today ABG was worse and showed a pH of 7.193, PCO2 of 110, PO2 of 74.2, a bicarbonate level of 41.2, and O2 saturation of 94.2% on 3 L nasal cannula -Continue to avoid eating and drinking while on BiPAP -Unclear if he could end up with a tracheostomy -Had a right heart cath on 04/21/2019 which showed moderate pulmonary hypertension -Pulmonary recommending nocturnal BiPAP and weaning FiO2 to keep saturations greater than 92%; will try to get the patient pathology for home -They recommended continuing bronchodilators and inhaled steroids but inhaled steroids have stopped and he is just now on Dulera and albuterol inhalations every 6 PRN for wheezing shortness of breath -Continue to follow pulmonary and cardiology recommendations -Cardiology recommending continue Imdur 30 mg p.o. daily -Pulmonary reevaluated and they have increased his nocturnal BiPAP to 14/6 with a increase in his average minute ventilation which will hopefully increase efficiency of CO2 clearance and Dr. [name] also recommended the patient sleeping in upright better recliner to help improve mechanical efficiency of his diaphragm; today Dr. [name] stopped his acetazolamide and recommends ambulation is deconditioning is not helping his lung mechanics and he feels that it may take several days to weeks to fully correct his acidosis that he is fairly adept at his hypercarbia;  -We will repeat an ABG in the morning and if his PaCO2 is slowly decreasing and improved he could likely be discharged with the trilogy BiPAP  Metabolic alkalosis and likely contraction alkalosis -Likely due to diuretics and hypercarbia. -Acetazolamide 250 mg p.o daily now stopped by pulmonary  -Chloride is now 93 and CO2 is now 35  RLL pneumonia/LUL pneumonia vs. mass?   -Patient has no leukocytosis or fever to suggest infectious process at  this time.  Unclear if the CT finding is likely due to fluid or infectious process. -Completed 5 days of azithromycin and 8 days of Rocephin which should be sufficient although he was not covered for anaerobic organisms in the setting of possible aspiration. -Checked procalcitonin and was <0.10 and discontinued antibiotics. -May need repeat CT in a few weeks to assess LUL mass/consolidation. -Chest x-ray this morning showed "Cardiomegaly and pulmonary artery enlargement. Unchanged pulmonary opacification.." -Patient is afebrile and WBC is 5.8 now -Continue with Dulera  Hypokalemia -His potassium this morning was 4.4 -Replete with p.o. potassium chloride as needed -Continue monitor replete as necessary -Repeat CMP in a.m.  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 twice daily  Hyperglycemia -Blood sugars on BMP/CMP's have been ranging from 91-120; blood sugar on CMP this morning was 114 -Check hemoglobin A1c and was 5.4 -Continue monitor and trend blood sugars carefully and if necessary will need to place on sensitive local sliding scale insulin AC  DVT prophylaxis: Enoxaparin 40 mg sq q24h Code Status: FULL CODE  Family Communication: No family present at beside  Disposition Plan: Anticipate discharging home in next 24 to 48 hours if medically stable and cleared by pulmonary  Consultants:   Cardiology   Pulmonary    Procedures: [place] on 04/21/2019  1. Mildly elevated right and left heart  filling pressures.  2. Moderate pulmonary hypertension, suspect primarily pulmonary venous hypertension.  3. PAPI low but not markedly low, suggesting mild-moderate RV dysfunction.   ECHOCARDIOGRAM IMPRESSIONS    1. The right ventricle has moderately reduced systolic function. The cavity was severely enlarged. There is no increase in right ventricular wall thickness. D-shaped interventricular septum suggestive of RV pressure/volume overload.  2. Right atrial size was severely dilated.  3. The left ventricle has low normal systolic function, with an ejection fraction of 50-55%. The cavity size was normal.  4. Limited echo for bubble study. The bubble study was negative, no evidence for PFO or ASD.  FINDINGS  Left Ventricle: The left ventricle has low normal systolic function, with an ejection fraction of 50-55%. The cavity size was normal. There is no increase in left ventricular wall thickness.    Right Ventricle: The right ventricle has moderately reduced systolic function. The cavity was severely enlarged. There is no increase in right ventricular wall thickness.  Left Atrium: Left atrial size was normal in size.  Right Atrium: Right atrial size was severely dilated. Right atrial pressure is estimated at 10 mmHg.  Interatrial Septum: Agitated saline contrast was given intravenously to evaluate for intracardiac shunting.   Antimicrobials:  Anti-infectives (From admission, onward)   Start     Dose/Rate Route Frequency Ordered Stop   04/21/19 1100  cefTRIAXone (ROCEPHIN) 1 g in sodium chloride 0.9 % 100 mL IVPB     1 g 200 mL/hr over 30 Minutes Intravenous Every 24 hours 04/21/19 1008 04/23/19 2359   04/21/19 1015  azithromycin (ZITHROMAX) tablet 250 mg  Status:  Discontinued     250 mg Oral Daily 04/21/19 1008 04/21/19 1514     Subjective: Seen and examined at bedside he states that he is doing okay.  No nausea or vomiting.  States that he feels okay.  No other concerns  or complaints at this time and has not been coughing but states that he has been ambulating but not as much.  Objective: Vitals:   04/25/19 0735 04/25/19 0758 04/25/19 1123 04/25/19 1609  BP:      Pulse:      Resp:      Temp:  97.6 F (36.4 C) 97.9 F (36.6 C) 97.8 F (36.6 C)  TempSrc:  Oral Oral Axillary  SpO2: 100%     Weight:      Height:        Intake/Output Summary (Last 24 hours) at 04/25/2019 1709 Last data filed at 04/25/2019 1500 Gross per 24 hour  Intake 700 ml  Output 1300 ml  Net -600 ml   Filed Weights   04/23/19 0646 04/24/19 0441 04/25/19 0300  Weight: 89.9 kg 90.4 kg 92.6 kg   Examination: Physical Exam:  Constitutional: Well-nourished, well-developed Caucasian male currently no acute distress sitting up in bed Eyes: Lids and conjunctive are normal.  Sclera anicteric ENMT: External ears nose appear normal.  Grossly normal hearing Neck: Appears supple no JVD Respiratory: Mildly diminished auscultation bilaterally with no appreciable wheezing, rales or rhonchi but he did have some diminished breath sounds more so on the left compared  to right.  No appreciable rales or crackles.  Wearing 3 L of supplemental oxygen via nasal cannula and has a severe pectus deformity noted Cardiovascular: Regular rate and rhythm.  No appreciable murmurs, rubs, gallops.  No lower extremity edema noted Abdomen: Soft, non-trichomonacide.  Backslash present GU: Deferred Musculoskeletal: No contractures or cyanosis.  Has a significant pectus deform Skin: Skin is warm and dry no appreciable rashes lesions on to skin evaluation Neurologic: Cranial nerves II through XII gross intact no appreciable focal deficits Psychiatric: Normal pleasant mood and affect.  Intact judgment insight.  Patient is awake, alert, oriented times  Data Reviewed: I have personally reviewed following labs and imaging studies  CBC: Recent Labs  Lab 04/21/19 0235 04/22/19 0337 04/23/19 0247 04/24/19 0157  04/25/19 0238  WBC 5.9 5.7 5.6 5.8 4.6  NEUTROABS  --   --   --  3.7 2.9  HGB 13.4 13.9 14.3 13.7 14.0  HCT 41.9 44.5 44.4 43.6 44.0  MCV 96.1 96.5 94.1 95.6 95.9  PLT 156 193 193 212 720   Basic Metabolic Panel: Recent Labs  Lab 04/21/19 0235 04/22/19 0337 04/22/19 1415 04/23/19 0247 04/23/19 0948 04/24/19 0157 04/25/19 0238  NA 137 137 136 138  --  139 136  K 3.5 2.7* 3.6 3.1*  --  3.3* 4.4  CL 83* 84* 85* 89*  --  91* 93*  CO2 45* 43* 42* 40*  --  41* 35*  GLUCOSE 111* 110* 91 128*  --  111* 114*  BUN 10 8 10 10   --  11 11  CREATININE 0.57* 0.61 0.70 0.66  --  0.68 0.64  CALCIUM 8.4* 8.5* 8.4* 8.5*  --  8.6* 8.6*  MG 2.1 1.9  --  2.0  --  2.2 2.0  PHOS  --   --   --   --  2.6 3.4 4.1   GFR: Estimated Creatinine Clearance: 152.2 mL/min (by C-G formula based on SCr of 0.64 mg/dL). Liver Function Tests: Recent Labs  Lab 04/21/19 0235 04/24/19 0157 04/25/19 0238  AST 21 31 27   ALT 23 42 40  ALKPHOS 52 52 50  BILITOT 0.9 0.4 0.6  PROT 6.3* 6.7 6.4*  ALBUMIN 3.2* 3.4* 3.5   No results for input(s): LIPASE, AMYLASE in the last 168 hours. No results for input(s): AMMONIA in the last 168 hours. Coagulation Profile: No results for input(s): INR, PROTIME in the last 168 hours. Cardiac Enzymes: No results for input(s): CKTOTAL, CKMB, CKMBINDEX, TROPONINI in the last 168 hours. BNP (last 3 results) Recent Labs    03/22/19 1012  PROBNP 62.0   HbA1C: Recent Labs    04/25/19 0238  HGBA1C 5.4   CBG: No results for input(s): GLUCAP in the last 168 hours. Lipid Profile: No results for input(s): CHOL, HDL, LDLCALC, TRIG, CHOLHDL, LDLDIRECT in the last 72 hours. Thyroid Function Tests: No results for input(s): TSH, T4TOTAL, FREET4, T3FREE, THYROIDAB in the last 72 hours. Anemia Panel: No results for input(s): VITAMINB12, FOLATE, FERRITIN, TIBC, IRON, RETICCTPCT in the last 72 hours. Sepsis Labs: Recent Labs  Lab 04/21/19 0235 04/22/19 1250  PROCALCITON 0.19  <0.10   Radiology Studies: Dg Chest Port 1 View  Result Date: 04/25/2019 CLINICAL DATA:  Shortness of breath EXAM: PORTABLE CHEST 1 VIEW COMPARISON:  Yesterday FINDINGS: Unchanged cardiomegaly with left more than right lung opacification, much of the left basal density is from the enlarged heart based on recent chest CT. Dilated pulmonary arterial tree. No visible effusion or  pneumothorax. IMPRESSION: 1. Cardiomegaly and pulmonary artery enlargement. 2. Unchanged pulmonary opacification. Electronically Signed   By: Monte Fantasia M.D.   On: 04/25/2019 07:49   Dg Chest Port 1 View  Result Date: 04/24/2019 CLINICAL DATA:  Shortness of breath EXAM: PORTABLE CHEST 1 VIEW COMPARISON:  Yesterday FINDINGS: Cardiomegaly with pulmonary artery enlargement. Opacified left chest which is primarily from cardiac enlargement based on recent chest CT. Streaky and airspace densities on both sides. IMPRESSION: 1. Chronic cardiomegaly and pulmonary artery dilatation. 2. Airspace disease that is stable from yesterday. Electronically Signed   By: Monte Fantasia M.D.   On: 04/24/2019 09:59   Scheduled Meds: . bisoprolol  5 mg Oral Daily  . enoxaparin (LOVENOX) injection  40 mg Subcutaneous Q24H  . furosemide  40 mg Oral BID  . isosorbide mononitrate  30 mg Oral Daily  . mometasone-formoterol  2 puff Inhalation BID  . pantoprazole  40 mg Oral BID  . potassium chloride SA  40 mEq Oral BID  . simethicone  80 mg Oral Daily  . sodium chloride flush  3 mL Intravenous Q12H  . sodium chloride flush  3 mL Intravenous Q12H  . sodium chloride flush  3 mL Intravenous Q12H  . spironolactone  12.5 mg Oral Daily   Continuous Infusions: . sodium chloride      LOS: 5 days   Kerney Elbe, DO [facility] PAGER is on AMION  If 7PM-7AM, please contact night-coverage [email or web address] Password [facility] 04/25/2019, 5:09 PM

## 2019-04-26 ENCOUNTER — Telehealth: Payer: Self-pay | Admitting: Primary Care

## 2019-04-26 DIAGNOSIS — J984 Other disorders of lung: Secondary | ICD-10-CM

## 2019-04-26 LAB — CBC WITH DIFFERENTIAL/PLATELET
Abs Immature Granulocytes: 0.01 10*3/uL (ref 0.00–0.07)
Basophils Absolute: 0 10*3/uL (ref 0.0–0.1)
Basophils Relative: 0 %
Eosinophils Absolute: 0 10*3/uL (ref 0.0–0.5)
Eosinophils Relative: 0 %
HCT: 42.2 % (ref 39.0–52.0)
Hemoglobin: 13.1 g/dL (ref 13.0–17.0)
Immature Granulocytes: 0 %
Lymphocytes Relative: 19 %
Lymphs Abs: 1.1 10*3/uL (ref 0.7–4.0)
MCH: 30 pg (ref 26.0–34.0)
MCHC: 31 g/dL (ref 30.0–36.0)
MCV: 96.6 fL (ref 80.0–100.0)
Monocytes Absolute: 0.7 10*3/uL (ref 0.1–1.0)
Monocytes Relative: 11 %
Neutro Abs: 4 10*3/uL (ref 1.7–7.7)
Neutrophils Relative %: 70 %
Platelets: 160 10*3/uL (ref 150–400)
RBC: 4.37 MIL/uL (ref 4.22–5.81)
RDW: 13 % (ref 11.5–15.5)
WBC: 5.8 10*3/uL (ref 4.0–10.5)
nRBC: 0 % (ref 0.0–0.2)

## 2019-04-26 LAB — BLOOD GAS, ARTERIAL
Acid-Base Excess: 14.1 mmol/L — ABNORMAL HIGH (ref 0.0–2.0)
Acid-Base Excess: 13.9 mmol/L — ABNORMAL HIGH (ref 0.0–2.0)
Bicarbonate: 42.6 mmol/L — ABNORMAL HIGH (ref 20.0–28.0)
Bicarbonate: 41.6 mmol/L — ABNORMAL HIGH (ref 20.0–28.0)
Delivery systems: POSITIVE
Drawn by: 50222
Drawn by: 275531
Expiratory PAP: 6
FIO2: 40
Inspiratory PAP: 16
O2 Content: 3 L/min
O2 Saturation: 95.6 %
O2 Saturation: 96.7 %
Patient temperature: 98.6
Patient temperature: 98.6
pCO2 arterial: 119 mmHg (ref 32.0–48.0)
pCO2 arterial: 103 mmHg (ref 32.0–48.0)
pH, Arterial: 7.181 — CL (ref 7.350–7.450)
pH, Arterial: 7.231 — ABNORMAL LOW (ref 7.350–7.450)
pO2, Arterial: 85.7 mmHg (ref 83.0–108.0)
pO2, Arterial: 91.4 mmHg (ref 83.0–108.0)

## 2019-04-26 LAB — COMPREHENSIVE METABOLIC PANEL
ALT: 36 U/L (ref 0–44)
AST: 22 U/L (ref 15–41)
Albumin: 3.5 g/dL (ref 3.5–5.0)
Alkaline Phosphatase: 55 U/L (ref 38–126)
Anion gap: 5 (ref 5–15)
BUN: 13 mg/dL (ref 6–20)
CO2: 38 mmol/L — ABNORMAL HIGH (ref 22–32)
Calcium: 8.4 mg/dL — ABNORMAL LOW (ref 8.9–10.3)
Chloride: 94 mmol/L — ABNORMAL LOW (ref 98–111)
Creatinine, Ser: 0.62 mg/dL (ref 0.61–1.24)
GFR calc Af Amer: >60 mL/min (ref >60)
GFR calc non Af Amer: >60 mL/min (ref >60)
Glucose, Bld: 114 mg/dL — ABNORMAL HIGH (ref 70–99)
Potassium: 3.8 mmol/L (ref 3.5–5.1)
Sodium: 137 mmol/L (ref 135–145)
Total Bilirubin: 0.5 mg/dL (ref 0.3–1.2)
Total Protein: 6.4 g/dL — ABNORMAL LOW (ref 6.5–8.1)

## 2019-04-26 LAB — MAGNESIUM: Magnesium: 1.9 mg/dL (ref 1.7–2.4)

## 2019-04-26 LAB — PHOSPHORUS: Phosphorus: 3.1 mg/dL (ref 2.5–4.6)

## 2019-04-26 LAB — BRAIN NATRIURETIC PEPTIDE: B Natriuretic Peptide: 60.6 pg/mL (ref 0.0–100.0)

## 2019-04-26 NOTE — Evaluation (Signed)
Physical Therapy Evaluation Patient Details Name: [name] MRN: [id number] DOB: 1981-06-19 Today's Date: 04/26/2019   History of Present Illness  The patient is a 38 year old with history of pectus excavatum status post repair as a child and  in 2011 at [place], [place],  diastolic CHF, pulmonary HTN, and chronic respiratory failure presenting with shortness of breath after trip to the beach.  Clinical Impression  Patient presents close to modI/I level for mobility, but feels he needs to work on stamina and strength after being hospitalized.  Reports did a program through [facility] where he could pay out of pocket a lot cheaper than outpatient PT.  Encouraged him to seek MD referral s they are not allowing community participation due to Covid.  Issued HEP for home for LE strengthening.  PT to sign off due to no further skilled PT needs.     Follow Up Recommendations No PT follow up    Equipment Recommendations  None recommended by PT    Recommendations for Other Services       Precautions / Restrictions Precautions Precautions: None      Mobility  Bed Mobility Overal bed mobility: Independent                Transfers Overall transfer level: Independent                  Ambulation/Gait Ambulation/Gait assistance: Independent Gait Distance (Feet): 300 Feet Assistive device: None Gait Pattern/deviations: Step-through pattern;WFL(Within Functional Limits) Gait velocity: not measured, but likely close to normal walking speed      Stairs            Wheelchair Mobility    Modified Rankin (Stroke Patients Only)       Balance Overall balance assessment: Mild deficits observed, not formally tested                                           Pertinent Vitals/Pain Pain Assessment: 0-10 Pain Score: 4  Pain Location: chest Pain Descriptors / Indicators: Tightness Pain Intervention(s): Repositioned;Monitored during session     Home Living Family/patient expects to be discharged to:: Private residence   Available Help at Discharge: Family Type of Home: House Home Access: Stairs to enter Entrance Stairs-Rails: None Technical brewer of Steps: 1 Home Layout: Two level Home Equipment: None      Prior Function Level of Independence: Independent         Comments: was working, now applying for disability     Hand Dominance        Extremity/Trunk Assessment        Lower Extremity Assessment Lower Extremity Assessment: Overall WFL for tasks assessed    Cervical / Trunk Assessment Cervical / Trunk Assessment: Other exceptions Cervical / Trunk Exceptions: deep pectus  Communication   Communication: No difficulties  Cognition Arousal/Alertness: Awake/alert Behavior During Therapy: WFL for tasks assessed/performed Overall Cognitive Status: Within Functional Limits for tasks assessed                                        General Comments General comments (skin integrity, edema, etc.): noted good stability with head turns during gait, LOB x 1 when initially performing squats at counter with min UE support on counter fell back with self recovery;  reports was more unstable, but it has gotten better with walks in hallway    Exercises General Exercises - Lower Extremity Hip Flexion/Marching: Strengthening;Both;10 reps;Standing Toe Raises: Strengthening;Both;10 reps;Standing Heel Raises: Strengthening;Both;10 reps;Standing Mini-Sqauts: Strengthening;Both;5 reps;Standing Low Level/ICU Exercises Stabilized Bridging: Strengthening;Both;10 reps;Supine   Assessment/Plan    PT Assessment Patent does not need any further PT services  PT Problem List         PT Treatment Interventions      PT Goals (Current goals can be found in the Care Plan section)  Acute Rehab PT Goals PT Goal Formulation: All assessment and education complete, DC therapy    Frequency     Barriers  to discharge        Co-evaluation               AM-PAC PT "6 Clicks" Mobility  Outcome Measure Help needed turning from your back to your side while in a flat bed without using bedrails?: None Help needed moving from lying on your back to sitting on the side of a flat bed without using bedrails?: None Help needed moving to and from a bed to a chair (including a wheelchair)?: None Help needed standing up from a chair using your arms (e.g., wheelchair or bedside chair)?: None Help needed to walk in hospital room?: None Help needed climbing 3-5 steps with a railing? : None 6 Click Score: 24    End of Session Equipment Utilized During Treatment: Oxygen Activity Tolerance: Patient tolerated treatment well Patient left: in bed;with call bell/phone within reach   PT Visit Diagnosis: Muscle weakness (generalized) (M62.81)    Time: 6644-0347 PT Time Calculation (min) (ACUTE ONLY): 24 min   Charges:   PT Evaluation $PT Eval Low Complexity: 1 Low PT Treatments $Gait Training: 8-22 mins        Magda Kiel, [place] [phone] 04/26/2019   Reginia Naas 04/26/2019, 1:59 PM

## 2019-04-26 NOTE — Telephone Encounter (Signed)
ONO 04/09/19 showed SpO2 <88% 4 hours 34 mins on BIPAP 3L oxygen. SpO2 low 60, baseling 79. Discussed with Dr. Halford Chessman. Patient needs titration study when out of the hospital

## 2019-04-26 NOTE — Progress Notes (Addendum)
NAME:  [name], MRN:  [id number], DOB:  1981-03-10, LOS: 6 ADMISSION DATE:  04/20/2019, CONSULTATION DATE:  8/12 REFERRING MD:  Dr. Reesa Chew, CHIEF COMPLAINT:  Chronic respiratory failure   Brief History   38 year old male with restrictive lund disease in the setting of pectus excavatum transferred to [facility] 8/11 from hospital in [place] where he was admitted and intubated for pulmonary edema +/- pneumonia.  Pulmonary consulted for restrictive lung disease.  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 He has had two reconstructive surgeries for his pectus, first in childhood, and most recently in 2011 a redo at [facility]. He was re-evaluated in 2019 for further surgical options at [facility], where it was determined there were none. He is also followed by the advanced CHF clinic and has been worked up with cardiac MRI in the past, demonstrating LA compression from pectus. On last visit in 03/2019 his lasix dose was adjusted and he was scheduled for RHC on 8/11, however, he required hospitalization.   [place] the beach [place] he presented the ED ([place]) with complaints of progressive dyspnea x months. Upon presentation he was in significant respiratory distress with hypoxemic and hypercarbic failure on ABG. CTA was negative for PE, but concerning for RLL infectious process +/- pulmonary edema. He ultimately required intubation. Treated with ceftriaxone/azithromycin and diuresis and was extubated on 8/10. He was then transferred to [facility] on 8/11 for ongoing workup including RHC. PCCM consulted for chronic respiratory failure.    Past Medical History   has a past medical history of Asthma, CHF (congestive heart failure) ([place]), Chronic hypercapnic respiratory failure ([place]), Pectus  excavatum, and Restrictive lung disease.   Significant Hospital Events   8/7 > 8/10 admit to OSH for pulm edema and CAP requiring intuabtion 8/10 extubated 8/11 transfer to [facility] 8/12 [facility] 8/16 bipap increased to 14/6  Consults:  CHF Pulmonary  Procedures:    Significant Diagnostic Tests:  [facility] 8/12 >  Micro Data:    Antimicrobials:  Ceftriaxone 8/8 > Azithromycin 8/8 > off  Interim history/subjective:   Over the weekend he developed increasing acidosis and BiPAP was increased to 14/6. Overnight tidal volumes were low on the settings and he was changed to AVAPS settings  Objective   Blood pressure 108/69, pulse 69, temperature 97.6 F (36.4 C), temperature source Oral, resp. rate (!) 22, height 6\' 4"  (1.93 m), weight 93 kg, SpO2 96 %.    FiO2 (%):  [40 %] 40 %   Intake/Output Summary (Last 24 hours) at 04/26/2019 1410 Last data filed at 04/26/2019 0530 Gross per 24 hour  Intake 798 ml  Output 775 ml  Net 23 ml   Filed Weights   04/24/19 0441 04/25/19 0300 04/26/19 0300  Weight: 90.4 kg 92.6 kg 93 kg    Examination: General: No acute distress.  Sleeping HEENT: No JVD or lymphadenopathy is appreciated Neuro: Grossly intact, mild asterixis CV: Heart sounds are regular regular rate and rhythm, pectus excavating is noted multiple surgical scars PULM: Decreased breath sounds at bases.  Currently on BiPAP with good facial seal. GI: soft, bsx4 active  Extremities: warm/dry, negative edema  Skin: no rashes or lesions   Resolved Hospital Problem list     Assessment & Plan:  Acute on chronic hypoxemic and hypercarbic respiratory failure: in the setting of restrictive lung disease due to pectus excavatum. Intubated from 8/7 to 8/10 at OSH for pulmonary edema +/- CAP improving with diuresis and antibiotics.  Collapsed left lobe -due to enlarged RV and pectus Plan  changed to AVAPS settings -we will see if ABG improves in 1 to 2 days, he may need trilogy NIV  with AVAPS settings on discharge dc acetazolamide. My partner Dr. [name] has reached out to surgeon at [facility] [place] and was directed to contact another surgeon in [place] doubt that he would have redo surgical possibilities for pectus excavatum   Pulmonary hypertension multifactorial in the setting of chronic hypoxia and LA compression.  Status post right heart cath on 04/21/2019 with mild elevated right and left heart filling pressures.  Moderate pulmonary hypertension suspect primary pulmonary venous hypertension.  Mild to moderate RV dysfunction Aggressive diuresis per cardiology noted to be greater than 4 L negative  Kara Mead MD. FCCP. [facility] Pager [phone] If no response call [phone]    04/26/2019, 2:10 PM

## 2019-04-26 NOTE — Progress Notes (Signed)
PROGRESS NOTE    [name]  PIR:[id number] DOB: October 22, 1980 DOA: 04/20/2019 PCP: [name], PA-C   Brief Narrative:  The patient is a 38 year old with history of pectus excavatum status post repair as a child and  in 2011 at [place], [place],  diastolic CHF, pulmonary HTN, and chronic respiratory failure presenting with shortness of breath after trip to the beach.   Admitted with acute on chronic respiratory failure requiring intubation at OSH ([place]).  CTA chest negative but concern for RLL pneumonia.  Started on diuretics and antibiotics.  Extubated to BiPAP on 04/19/2019 and transferred to [facility] for elective RHC by cardiology.  Two view CXR here without acute finding.  CT chest without contrast with severe pectus excavatum with mass-effect on heart, finding consistent with pulmonary HTN, focal LUL consolidation/mass and additional areas of scattered consolidation with regions of bronchiectasis.  Cardiac MRI revealed collapse of the left lung likely from leftward shift of heart and dilated RV, dilated main PA, markedly dilated RV with EF of 31%, moderate hypokinesis and D-shaped interventricular septum  **Interim History Cardiology diuresing and pulmonary following as well.  Pulmonary recommending continuing full facemask BiPAP at night and arranging for second opinion and Dr. [name] reached out to Dr. [name] is not to redo surgeries anymore and recommends Dr. [name] [facility] who does absorbable plates.  Pulmonary will try to set the patient up with trilogy at home at night and recommending repeating ABG on 04/26/2019 in a.m. for discharge then but ABG was repeated yesterday and pulmonary came to evaluate are making BiPAP adjustments.  They recommend repeating ABG in the morning and if stable and decreasing CO2 can likely be discharged however patient CO2 was again elevated.  Dr. [name] came to evaluate and stop his acetazolamide and recommended the patient  ambulate.  ABG was repeated this morning and showed a pH that was even lower than yesterday and the CO2 that was higher than yesterday.  Patient was switched to AVAPS mode which helped a CO2 clearance and repeat ABG was improved.  Pulmonology recommends continuing AVAPS settings and seeing if ABG improved in 1 to 2 days.  If he does he will likely need trilogy NIV with AVAPS settings on discharge.  PT OT evaluated and recommends no follow-up  Assessment & Plan:   Principal Problem:   Pulmonary hypertension ([place]) Active Problems:   Moderate persistent asthma in adult without complication   Restrictive lung disease   Chronic respiratory failure with hypoxia and hypercapnia (HCC)   Chronic diastolic CHF (congestive heart failure) (HCC)   Chronic right-sided CHF (congestive heart failure) (HCC)   Right heart failure (HCC)   Acute respiratory failure with hypoxia (HCC)   Acute on chronic respiratory failure with hypercarbia On 2 to 3 L at home. 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 -Intubated at OSH and extubated on 8/10 after diuresis. -Echo with EF of 55%, abnormal septal motion, G1 DD, severe RV enlargement. -Cardiac MRI revealed collapse of the left lung, right heart failure with EF of 31%, moderate hypokinesis -Currently is -3.835 L since admission  Renal function stable. -Continue diuretics and cardiac meds per cardiology.  On Diamox for metabolic alkalosis.  Cardiology changed IV Lasix to p.o. Lasix yesterday at 40 twice daily and replacing K -Daily weight, intake output and renal functions. -Encourage BiPAP and pulmonary trying full facemask and recommending  trilogy -Appreciate pulmonology and cardiology input. -Dr. [name] in process for referring the patient and likely will need to make a referral to Dr. [name] for considering a second surgical opinion given  that this appears to be a primary mechanical problem with severe pectus and left upper shift of the heart with compression of the left lung -Pulmonary recommending repeating ABG on Monday but after Dr. Haroldine Laws spoke with Dr. [name] today they repeated it today and it showed the patient was hypercarbic again -Today ABG was worse and showed a pH of 7.181, PCO2 of 119, PO2 of 85.7, a bicarbonate level of 42.6, and O2 saturation of 95.6 % on BiPAP 16/6 with 40% FiO2 -He was Transitioned to AVAPS setting and repeat ABG improved and showed a pH of 7.231, PCO2 of 103, PO2 91.4, bicarbonate level of 41.6, as well as O2 saturation of 96.7% -Continue to avoid eating and drinking while on BiPAP -Unclear if he could end up with a tracheostomy -Had a right heart cath on 04/21/2019 which showed moderate pulmonary hypertension -Pulmonary recommending nocturnal BiPAP and weaning FiO2 to keep saturations greater than 92%; will try to get the patient pathology for home -They recommended continuing bronchodilators and inhaled steroids but inhaled steroids have stopped and he is just now on Dulera and albuterol inhalations every 6 PRN for wheezing shortness of breath -Continue to follow pulmonary and cardiology recommendations -Cardiology recommending continue Imdur 30 mg p.o. daily -patient's acetazolamide was stopped by pulmonary and he was placed on 16/6 BiPAP which was ineffective.  It was recommended to him to sleep in upright position or in a recliner to help his lung mechanics and continue to ambulate -We will repeat an ABG in the morning and if his PaCO2 is slowly decreasing and improved he could likely be discharged with the trilogy NIV with possible AVAPS settings  Metabolic alkalosis and likely contraction alkalosis -Likely due to diuretics and hypercarbia. -Acetazolamide 250 mg p.o daily now stopped by pulmonary  -Chloride is now 94 and CO2 is now 38  RLL pneumonia/LUL pneumonia vs. mass?    -Patient has no leukocytosis or fever to suggest infectious process at this time.  Unclear if the CT finding is likely due to fluid or infectious process. -Completed 5 days of azithromycin and 8 days of Rocephin which should be sufficient although he was not covered for anaerobic organisms in the setting of possible aspiration. -Checked procalcitonin and was <0.10 and discontinued antibiotics. -May need repeat CT in a few weeks to assess LUL mass/consolidation. -Chest x-ray this morning showed "Cardiomegaly and pulmonary artery enlargement. Unchanged pulmonary opacification.." -Patient is afebrile and WBC is 5.8 now -Continue with Dulera  Hypokalemia -His potassium this morning was 3.8 -Replete with p.o. potassium chloride as needed -Continue monitor replete as necessary -Repeat CMP in a.m.  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 twice daily  Hyperglycemia -Blood sugars on BMP/CMP's have been ranging from 91-120; blood sugar on CMP this morning was 114 -Check hemoglobin A1c and was  5.4 -Continue monitor and trend blood sugars carefully and if necessary will need to place on sensitive local sliding scale insulin AC  DVT prophylaxis: Enoxaparin 40 mg sq q24h Code Status: FULL CODE  Family Communication: No family present at beside  Disposition Plan: Anticipate discharging home in next 24 to 48 hours if medically stable and cleared by pulmonary and if his CO2 is improved  Consultants:    Cardiology   Pulmonary    Procedures: [place] on 04/21/2019  1. Mildly elevated right and left heart filling pressures.  2. Moderate pulmonary hypertension, suspect primarily pulmonary venous hypertension.  3. PAPI low but not markedly low, suggesting mild-moderate RV dysfunction.   ECHOCARDIOGRAM IMPRESSIONS    1. The right ventricle has moderately reduced systolic function. The cavity was severely enlarged. There is no increase in right ventricular wall thickness. D-shaped interventricular septum suggestive of RV pressure/volume overload.  2. Right atrial size was severely dilated.  3. The left ventricle has low normal systolic function, with an ejection fraction of 50-55%. The cavity size was normal.  4. Limited echo for bubble study. The bubble study was negative, no evidence for PFO or ASD.  FINDINGS  Left Ventricle: The left ventricle has low normal systolic function, with an ejection fraction of 50-55%. The cavity size was normal. There is no increase in left ventricular wall thickness.    Right Ventricle: The right ventricle has moderately reduced systolic function. The cavity was severely enlarged. There is no increase in right ventricular wall thickness.  Left Atrium: Left atrial size was normal in size.  Right Atrium: Right atrial size was severely dilated. Right atrial pressure is estimated at 10 mmHg.  Interatrial Septum: Agitated saline contrast was given intravenously to evaluate for intracardiac shunting.   Antimicrobials:  Anti-infectives (From admission, onward)   Start     Dose/Rate Route Frequency Ordered Stop   04/21/19 1100  cefTRIAXone (ROCEPHIN) 1 g in sodium chloride 0.9 % 100 mL IVPB     1 g 200 mL/hr over 30 Minutes Intravenous Every 24 hours 04/21/19 1008 04/23/19 2359   04/21/19 1015  azithromycin (ZITHROMAX) tablet 250 mg  Status:  Discontinued     250 mg Oral Daily 04/21/19 1008 04/21/19 1514     Subjective: Seen and examined at  bedside he was wearing BiPAP this morning.  Denies chest pain, lightheadedness or dizziness.  No nausea or vomiting.  No other concerns or complaints this time was frustrated was wanting to go home.  Objective: Vitals:   04/26/19 0340 04/26/19 0625 04/26/19 0841 04/26/19 1125  BP: 113/73   108/69  Pulse: 88 80  69  Resp: (!) 25 18  (!) 22  Temp: 97.6 F (36.4 C)     TempSrc: Oral     SpO2: 97% 100% 98% 96%  Weight:      Height:        Intake/Output Summary (Last 24 hours) at 04/26/2019 1628 Last data filed at 04/26/2019 0530 Gross per 24 hour  Intake 798 ml  Output 575 ml  Net 223 ml   Filed Weights   04/24/19 0441 04/25/19 0300 04/26/19 0300  Weight: 90.4 kg 92.6 kg 93 kg   Examination: Physical Exam:  Constitutional: Well-nourished, well-developed Caucasian male wearing BiPAP Eyes: Lids and conjunctive are normal.  Sclera anicteric ENMT: External ears nose appear normal.  Grossly normal hearing Neck: Appears supple no JVD Respiratory: Diminished auscultation bilaterally with no appreciable wheezing, rales, rhonchi.  More so diminished on the left  compared to right.  Wearing his BiPAP today and has a severe pectus deformity noted Cardiovascular: Regular rate and rhythm.  No appreciable murmurs, rubs, gallops.  No lower extremity edema noted Abdomen: Soft, nontender, nondistended.  Bowel sounds present GU: Deferred Musculoskeletal: No contractures or cyanosis.  Has a significant pectus deformity Skin: Skin is warm and dry no appreciable rashes or lesions limited skin evaluation Neurologic: Cranial nerves II through XII gross intact no appreciable focal deficits Psychiatric: Slightly frustrated but has a normal mood and affect.  Intact judgment and insight.  Patient is awake, alert, oriented  Data Reviewed: I have personally reviewed following labs and imaging studies  CBC: Recent Labs  Lab 04/22/19 0337 04/23/19 0247 04/24/19 0157 04/25/19 0238 04/26/19 0228  WBC  5.7 5.6 5.8 4.6 5.8  NEUTROABS  --   --  3.7 2.9 4.0  HGB 13.9 14.3 13.7 14.0 13.1  HCT 44.5 44.4 43.6 44.0 42.2  MCV 96.5 94.1 95.6 95.9 96.6  PLT 193 193 212 182 578   Basic Metabolic Panel: Recent Labs  Lab 04/22/19 0337 04/22/19 1415 04/23/19 0247 04/23/19 0948 04/24/19 0157 04/25/19 0238 04/26/19 0228  NA 137 136 138  --  139 136 137  K 2.7* 3.6 3.1*  --  3.3* 4.4 3.8  CL 84* 85* 89*  --  91* 93* 94*  CO2 43* 42* 40*  --  41* 35* 38*  GLUCOSE 110* 91 128*  --  111* 114* 114*  BUN 8 10 10   --  11 11 13   CREATININE 0.61 0.70 0.66  --  0.68 0.64 0.62  CALCIUM 8.5* 8.4* 8.5*  --  8.6* 8.6* 8.4*  MG 1.9  --  2.0  --  2.2 2.0 1.9  PHOS  --   --   --  2.6 3.4 4.1 3.1   GFR: Estimated Creatinine Clearance: 152.2 mL/min (by C-G formula based on SCr of 0.62 mg/dL). Liver Function Tests: Recent Labs  Lab 04/21/19 0235 04/24/19 0157 04/25/19 0238 04/26/19 0228  AST 21 31 27 22   ALT 23 42 40 36  ALKPHOS 52 52 50 55  BILITOT 0.9 0.4 0.6 0.5  PROT 6.3* 6.7 6.4* 6.4*  ALBUMIN 3.2* 3.4* 3.5 3.5   No results for input(s): LIPASE, AMYLASE in the last 168 hours. No results for input(s): AMMONIA in the last 168 hours. Coagulation Profile: No results for input(s): INR, PROTIME in the last 168 hours. Cardiac Enzymes: No results for input(s): CKTOTAL, CKMB, CKMBINDEX, TROPONINI in the last 168 hours. BNP (last 3 results) Recent Labs    03/22/19 1012  PROBNP 62.0   HbA1C: Recent Labs    04/25/19 0238  HGBA1C 5.4   CBG: No results for input(s): GLUCAP in the last 168 hours. Lipid Profile: No results for input(s): CHOL, HDL, LDLCALC, TRIG, CHOLHDL, LDLDIRECT in the last 72 hours. Thyroid Function Tests: No results for input(s): TSH, T4TOTAL, FREET4, T3FREE, THYROIDAB in the last 72 hours. Anemia Panel: No results for input(s): VITAMINB12, FOLATE, FERRITIN, TIBC, IRON, RETICCTPCT in the last 72 hours. Sepsis Labs: Recent Labs  Lab 04/21/19 0235 04/22/19 1250   PROCALCITON 0.19 <0.10   Radiology Studies: Dg Chest Port 1 View  Result Date: 04/25/2019 CLINICAL DATA:  Shortness of breath EXAM: PORTABLE CHEST 1 VIEW COMPARISON:  Yesterday FINDINGS: Unchanged cardiomegaly with left more than right lung opacification, much of the left basal density is from the enlarged heart based on recent chest CT. Dilated pulmonary arterial tree. No visible effusion or  pneumothorax. IMPRESSION: 1. Cardiomegaly and pulmonary artery enlargement. 2. Unchanged pulmonary opacification. Electronically Signed   By: Monte Fantasia M.D.   On: 04/25/2019 07:49   Scheduled Meds: . bisoprolol  5 mg Oral Daily  . enoxaparin (LOVENOX) injection  40 mg Subcutaneous Q24H  . furosemide  40 mg Oral BID  . isosorbide mononitrate  30 mg Oral Daily  . mometasone-formoterol  2 puff Inhalation BID  . pantoprazole  40 mg Oral BID  . potassium chloride SA  40 mEq Oral BID  . simethicone  80 mg Oral Daily  . sodium chloride flush  3 mL Intravenous Q12H  . sodium chloride flush  3 mL Intravenous Q12H  . sodium chloride flush  3 mL Intravenous Q12H  . spironolactone  12.5 mg Oral Daily   Continuous Infusions: . sodium chloride      LOS: 6 days   Kerney Elbe, DO [facility] PAGER is on AMION  If 7PM-7AM, please contact night-coverage [email or web address] Password [facility] 04/26/2019, 4:28 PM

## 2019-04-26 NOTE — Progress Notes (Signed)
Patient has been placed in AVAPS following critical values on ABG. The patient is alert and oriented. He is able to walk and use the restroom with his only complaint being a headache. He is very cooperative and feeling comfortable on the current mode. Post ABG to be drawn at 0800.

## 2019-04-26 NOTE — Progress Notes (Signed)
eLink Physician-Brief Progress Note Patient Name: [name] DOB: [birth year] MRN: [id number]   Date of Service  04/26/2019  HPI/Events of Note  In plans to set up home NIV, RT would like to switch the mode on BIPAP to AVAPs since patient isn't getting good volumes on 16/6 settings. I think this is reasonable to allow targeted volume.  He is otherwise awake and alert and with no symptoms.   eICU Interventions  Order placed  RT told me next ABG is already ordered for this AM at 8 am     Intervention Category Major Interventions: Respiratory failure - evaluation and management  Triston Lisanti G Grae Leathers 04/26/2019, 6:05 AM

## 2019-04-27 ENCOUNTER — Telehealth: Payer: Self-pay | Admitting: Pulmonary Disease

## 2019-04-27 LAB — CBC WITH DIFFERENTIAL/PLATELET
Abs Immature Granulocytes: 0.04 10*3/uL (ref 0.00–0.07)
Basophils Absolute: 0 10*3/uL (ref 0.0–0.1)
Basophils Relative: 0 %
Eosinophils Absolute: 0 10*3/uL (ref 0.0–0.5)
Eosinophils Relative: 1 %
HCT: 40.6 % (ref 39.0–52.0)
Hemoglobin: 12.7 g/dL — ABNORMAL LOW (ref 13.0–17.0)
Immature Granulocytes: 1 %
Lymphocytes Relative: 23 %
Lymphs Abs: 1 10*3/uL (ref 0.7–4.0)
MCH: 30.4 pg (ref 26.0–34.0)
MCHC: 31.3 g/dL (ref 30.0–36.0)
MCV: 97.1 fL (ref 80.0–100.0)
Monocytes Absolute: 0.6 10*3/uL (ref 0.1–1.0)
Monocytes Relative: 14 %
Neutro Abs: 2.5 10*3/uL (ref 1.7–7.7)
Neutrophils Relative %: 61 %
Platelets: 133 10*3/uL — ABNORMAL LOW (ref 150–400)
RBC: 4.18 MIL/uL — ABNORMAL LOW (ref 4.22–5.81)
RDW: 13.2 % (ref 11.5–15.5)
WBC: 4.1 10*3/uL (ref 4.0–10.5)
nRBC: 0 % (ref 0.0–0.2)

## 2019-04-27 LAB — COMPREHENSIVE METABOLIC PANEL
ALT: 29 U/L (ref 0–44)
AST: 19 U/L (ref 15–41)
Albumin: 3.4 g/dL — ABNORMAL LOW (ref 3.5–5.0)
Alkaline Phosphatase: 52 U/L (ref 38–126)
Anion gap: 9 (ref 5–15)
BUN: 10 mg/dL (ref 6–20)
CO2: 38 mmol/L — ABNORMAL HIGH (ref 22–32)
Calcium: 8.6 mg/dL — ABNORMAL LOW (ref 8.9–10.3)
Chloride: 92 mmol/L — ABNORMAL LOW (ref 98–111)
Creatinine, Ser: 0.6 mg/dL — ABNORMAL LOW (ref 0.61–1.24)
GFR calc Af Amer: >60 mL/min (ref >60)
GFR calc non Af Amer: >60 mL/min (ref >60)
Glucose, Bld: 99 mg/dL (ref 70–99)
Potassium: 4 mmol/L (ref 3.5–5.1)
Sodium: 139 mmol/L (ref 135–145)
Total Bilirubin: 0.5 mg/dL (ref 0.3–1.2)
Total Protein: 6.3 g/dL — ABNORMAL LOW (ref 6.5–8.1)

## 2019-04-27 LAB — BLOOD GAS, ARTERIAL
Acid-Base Excess: 16.8 mmol/L — ABNORMAL HIGH (ref 0.0–2.0)
Bicarbonate: 43.7 mmol/L — ABNORMAL HIGH (ref 20.0–28.0)
Drawn by: 535271
O2 Content: 3 L/min
O2 Saturation: 97.3 %
Patient temperature: 98.6
pCO2 arterial: 88.1 mmHg (ref 32.0–48.0)
pH, Arterial: 7.316 — ABNORMAL LOW (ref 7.350–7.450)
pO2, Arterial: 95.3 mmHg (ref 83.0–108.0)

## 2019-04-27 LAB — PHOSPHORUS: Phosphorus: 3.1 mg/dL (ref 2.5–4.6)

## 2019-04-27 LAB — MAGNESIUM: Magnesium: 1.9 mg/dL (ref 1.7–2.4)

## 2019-04-27 LAB — HEPATITIS B SURFACE ANTIGEN: Hepatitis B Surface Ag: NEGATIVE

## 2019-04-27 MED ORDER — SENNOSIDES-DOCUSATE SODIUM 8.6-50 MG PO TABS: 2.0000 | ORAL_TABLET | Freq: Every evening | ORAL | 0 refills | Status: DC | PRN

## 2019-04-27 MED ORDER — POLYETHYLENE GLYCOL 3350 17 G PO PACK: 17.0000 g | PACK | Freq: Every day | ORAL | 0 refills | Status: DC | PRN

## 2019-04-27 MED ORDER — SPIRONOLACTONE 25 MG PO TABS: 12.5000 mg | ORAL_TABLET | Freq: Every day | ORAL | 0 refills | Status: DC

## 2019-04-27 MED ORDER — ONDANSETRON HCL 4 MG PO TABS: 4.0000 mg | ORAL_TABLET | Freq: Four times a day (QID) | ORAL | 0 refills | Status: DC | PRN

## 2019-04-27 MED ORDER — FUROSEMIDE 40 MG PO TABS: 40.0000 mg | ORAL_TABLET | Freq: Two times a day (BID) | ORAL | 0 refills | Status: DC

## 2019-04-27 NOTE — TOC Transition Note (Signed)
Transition of Care [facility]) - CM/SW Discharge Note   Patient Details  Name: SLAYDEN MENNENGA MRN: [id number] Date of Birth: 1981/03/04  Transition of Care [facility]) CM/SW Contact:  Zenon Mayo, RN Phone Number: 04/27/2019, 4:00 PM   Clinical Narrative:    Patient for dc today, needs NIV to go home with, [employer] was not able to have a resp therapist to see patient today.  NCM contacted Suanne Marker with [employer], she states they will have a resp therapist out to see patient today at his home.  NCM notifed RN Drue Dun to go ahead and dc patient.    Final next level of care: Home/Self Care Barriers to Discharge: No Barriers Identified   Patient Goals and CMS Choice Patient states their goals for this hospitalization and ongoing recovery are:: get better   Choice offered to / list presented to : NA  Discharge Placement                       Discharge Plan and Services                DME Arranged: NIV DME Agency: [place] Date DME Agency Contacted: 04/27/19 Time DME Agency Contacted: 1600 Representative spoke with at DME Agency: [facility] HH Arranged: NA          Social Determinants of Health ([place]) Interventions     Readmission Risk Interventions No flowsheet data found.

## 2019-04-27 NOTE — Discharge Summary (Signed)
Physician Discharge Summary  [name]:[id number] DOB: [birth year] DOA: 04/20/2019  PCP: [name], PA-C  Admit date: 04/20/2019 Discharge date: 04/27/2019  Admitted From: Home Disposition: Home with [place]   Recommendations for Outpatient Follow-up:  1. Follow up with PCP in 1-2 weeks 2. Follow up with Cardiology within 1-2 weeks 3. Follow up with Pulmonary Dr. [name] within 1-2 weeks and have him refer to a Pectus Specialist Dr. [name] in [place] 4. Will need MRI of the abdomen and liver to evaluate liver lesion and likely hemangioma in outpatient setting 5. Continue Trilogy NIV with Auto AVAPS settings at D/C 6. Please obtain CMP/CBC, Mag, Phos in one week 7. Please follow up on the following pending results:  Home Health: Yes Equipment/Devices: Trilogy NIV    Discharge Condition: Stable CODE STATUS: FULL CODE Diet recommendation: Heart Healthy Diet   Brief/Interim Summary: The patient is a 38 year old with history ofpectus excavatumstatus post repair as a child andin 2011 at [place], RLD, diastolic CHF, pulmonary HTN, andchronic respiratory failure presenting with shortness of breath after trip to the beach.   Admitted with acute on chronic respiratory failure requiring intubation at OSH ([place]). CTA chest negative but concern for RLL pneumonia. Started on diuretics and antibiotics. Extubated to BiPAP on 04/19/2019 and transferred to [facility] for elective RHC by cardiology. [place] CXR here without acute finding. CT chest without contrast with severe pectus excavatum with mass-effect on heart, finding consistent with pulmonary HTN, focal LUL consolidation/mass and additional areas of scattered consolidation with regions of bronchiectasis.Cardiac MRI revealed collapse of the left lung likely from leftward shift of heart and dilated RV, dilated main PA, markedly dilated RV with EF of 31%, moderate hypokinesis andD-shaped  interventricular septum  **Interim History Cardiology diuresing and pulmonary following as well.  Pulmonary recommending continuing full facemask BiPAP at night and arranging for second opinion and Dr. [name] reached out to Dr. [name] is not to redo surgeries anymore and recommends Dr. [name] [facility] who does absorbable plates.  Pulmonary will try to set the patient up with trilogy at home at night and recommending repeating ABG on 04/26/2019 in a.m. for discharge then but ABG was repeated yesterday and pulmonary came to evaluate are making BiPAP adjustments.  They recommend repeating ABG in the morning and if stable and decreasing CO2 can likely be discharged however patient CO2 was again elevated.  Dr. [name] came to evaluate and stop his acetazolamide and recommended the patient ambulate.  ABG was repeated this morning and showed a pH that was even lower than yesterday and the CO2 that was higher than yesterday.    Patient was switched to AVAPS mode which helped a CO2 clearance and repeat ABG was improved.  Pulmonology recommends continuing AVAPS settings and ABG improved significantly he is deemed stable for discharge per pulmonary and they recommending discharging patient home with trilogy NIV with auto AVAPS settings.  Trilogy was obtained and patient was discharged home on a respiratory therapist was sent to the patient's house later today to adjust the trilogy machine.  Patient will need to follow-up with PCP, pulmonary as well as cardiology in outpatient setting within 1 to 2 weeks and have Dr. [name] refer the patient to [facility] for evaluation by Dr. [name].  Discharge Diagnoses:  Principal Problem:   Pulmonary hypertension ([place]) Active Problems:   Moderate persistent asthma in adult without complication   Restrictive lung disease   Chronic respiratory failure with hypoxia  and hypercapnia (HCC)   Chronic diastolic CHF (congestive heart failure) (HCC)   Chronic  right-sided CHF (congestive heart failure) (HCC)   Right heart failure (HCC)   Acute respiratory failure with hypoxia (HCC)  Acute on chronic respiratory failure with hypercarbia On 2 to 3 L at home. 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 -Intubated at OSH and extubated on 8/10 after diuresis. -Echo with EF of 55%, abnormal septal motion, G1 DD, severe RV enlargement. -Cardiac MRI revealed collapse of the left lung,right heart failure with EF of 31%, moderate hypokinesis -Currently is -3.835 L since admissionRenal function stable. -Continue diuretics and cardiac meds per cardiology. On Diamox for metabolic alkalosis.  Cardiology changed IV Lasix to p.o. Lasix yesterday at 40 twice daily and replacing K -Daily weight, intake output and renal functions. -Encourage BiPAP and pulmonary trying full facemask and recommending trilogy -Appreciate pulmonology and cardiology input. -Dr. [name] in process for referring the patient and likely will need to make a referral to Dr. [name] for considering a second surgical opinion given that this appears to be a primary mechanical problem with severe pectus and left upper shift of the heart with compression of the left lung -Pulmonary recommending repeating ABG on Monday but after Dr. Haroldine Laws spoke with Dr. [name] today they repeated it today and it showed the patient was hypercarbic again -Yesterday ABG was worse and showed a pH of 7.181, PCO2 of 119, PO2 of 85.7, a bicarbonate level of 42.6, and O2 saturation of 95.6 % on BiPAP 16/6 with 40% FiO2 -He was Transitioned to AVAPS setting and repeat ABG improved and showed a pH of 7. 316, PCO2 of 88.1, PO2 95.3, bicarbonate level of 43.7, as well as O2 saturation of 97.3 % -Continue to avoid eating and drinking while on BiPAP -Unclear if he could end up with a tracheostomy but for  now it seems that his respiratory status has stabilized on auto AVAPS -Had a right heart cath on 04/21/2019 which showed moderate pulmonary hypertension -Pulmonary recommending nocturnal BiPAP and weaning FiO2 to keep saturations greater than 92%; will try to get the patient pathology for home -They recommended continuing bronchodilators and inhaled steroids but inhaled steroids have stopped and he is just now on Dulera and albuterol inhalations every 6 PRN for wheezing shortness of breath -Continue to follow pulmonary and cardiology recommendations -Cardiology recommending continue Imdur 30 mg p.o. daily -patient's acetazolamide was stopped by pulmonary and he was placed on 16/6 BiPAP which was ineffective.  It was recommended to him to sleep in upright position or in a recliner to help his lung mechanics and continue to ambulate -Repeat epeat an ABG this morning improved pulmonary cleared the patient for discharge with Trilogy NIV with possible AVAPS settings -** NOTE:  The patient continues to exhibit signs of hypercapnia associated with chronic respiratory failure secondary to a restrictive thoracic disorder from his pectus excavatum.  Patient requires the use of NIV both nightly and daytime to help with exacerbation periods. The use of NIV Trilogy device with auto APAP settings will treat the patient's high levels PCO2 levels and can reduce the risk of exacerbation to future hospitalizations and use at night and during the day.  The patient will need these advanced settings in conjunction with their current medication regimen.  BiPAP is not an option due to his functional limitations and severity of the patient's condition.  Failure to have the NIV available for use over a 24-hour period could lead to death.  Patient is able to protect her airway to clear their own airway on her own.          Metabolic alkalosis and likely contraction alkalosis -Likely due to diuretics and  hypercarbia. -Acetazolamide 250 mg p.o daily now stopped by pulmonary  -Chloride is now  92 and CO2 is now 38  RLL pneumonia/LUL pneumoniavs. mass? -Patient has no leukocytosis or fever to suggest infectious process at this time. Unclear if the CT finding is likely due to fluid or infectious process. -Completed 5 days of azithromycin and8days of Rocephin which should be sufficient although he was not covered for anaerobic organisms in the setting of possible aspiration. -Checked procalcitonin and was <0.10 and discontinued antibiotics. -May need repeat CT in a few weeks to assess LUL mass/consolidation. -Chest x-ray on 04/25/2019 showed "Cardiomegaly and pulmonary artery enlargement. Unchanged pulmonary opacification.." -Patient is afebrile and WBC is 4.1 now -Continue with Dulera substitution while hospitalized and resume Symbicort at discharge  Hypokalemia -His potassium this morning was  4.0 -Replete with p.o. potassium chloride as needed -Continue monitor replete as necessary -Repeat CMP in a.m.  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 twice daily and resume home omeprazole at discharge  Hyperglycemia -Blood sugars on BMP/CMP's have been ranging from 91-120; blood sugar on CMP this morning was 99 -Check hemoglobin A1c and was 5.4 -Continue monitor and trend blood sugars carefully and if necessary will need to place on sensitive local sliding scale insulin AC  Thrombocytopenia   -Mild.  Platelet count has been dropping and went from 212-1 33 -Continue to monitor for signs and symptoms of bleeding; currently no overt bleeding noted -DVT prophylaxis is stopped Continue monitor and trend and repeat CBC in 1 week and have PCP address if still low  Normocytic Anemia -Patient's hemoglobin/hematocrit went from 14.0/44.0 is now 12.7/40.6; unclear why dropped slightly -Check anemia panel outpatient setting -Continue to monitor for signs and symptoms of bleeding; currently no overt bleeding noted -Repeat CBC as an outpatient   Discharge Instructions  Discharge Instructions    Call MD for:  difficulty breathing, headache or visual disturbances   Complete by: As directed    Call MD for:  extreme fatigue   Complete by: As directed    Call MD for:  hives   Complete by: As directed    Call MD for:  persistant dizziness or light-headedness   Complete by: As directed    Call MD for:  persistant nausea and vomiting   Complete by: As directed    Call MD for:  redness, tenderness, or signs of infection (pain, swelling, redness, odor or green/yellow discharge around incision site)   Complete by: As directed    Call MD for:  severe uncontrolled pain   Complete by: As directed    Call MD for:  temperature >100.4   Complete by: As directed    Diet - low sodium  heart healthy   Complete by: As directed    Discharge instructions   Complete by: As directed    You were cared for by a hospitalist during your hospital stay. If you have any questions about your discharge medications or the care you received while you were in the hospital after you are discharged, you can call the unit and ask to speak with the hospitalist on call if the hospitalist that took care of you is not available. Once you are discharged, your primary care physician will handle any further medical issues. Please note that NO REFILLS for any discharge medications will be authorized once you are discharged, as it  is imperative that you return to your primary care physician (or establish a relationship with a primary care physician if you do not have one) for your aftercare needs so that they can reassess your need for medications and monitor your lab values.  Follow up with PCP, Pulmonary, and Cardiology. Have PCP refer for outpatient MRI of Liver. Take all medications as prescribed. If symptoms change or worsen please return to the ED for evaluation   Increase activity slowly   Complete by: As directed      Allergies as of 04/27/2019      Reactions   Other Itching   Pet Dander      Medication List    TAKE these medications   acetaminophen 500 MG tablet Commonly known as: TYLENOL Take 1,000 mg by mouth every 6 (six) hours as needed for mild pain.   albuterol 108 (90 Base) MCG/ACT inhaler Commonly known as: ProAir HFA Inhale 2 puffs into the lungs every 6 (six) hours as needed for wheezing or shortness of breath.   albuterol (2.5 MG/3ML) 0.083% nebulizer solution Commonly known as: PROVENTIL Take 3 mLs (2.5 mg total) by nebulization every 6 (six) hours as needed for wheezing or shortness of breath.   amphetamine-dextroamphetamine 10 MG tablet Commonly known as: ADDERALL Take 10 mg by mouth 4 (four) times daily as needed.   bisoprolol 5 MG tablet Commonly known as: ZEBETA TAKE 1 TABLET BY MOUTH EVERY DAY   budesonide-formoterol 80-4.5 MCG/ACT inhaler Commonly known as: Symbicort Inhale 2 puffs into the lungs 2 (two) times a day.   cetirizine 10 MG tablet Commonly known as: ZYRTEC Take 1 tablet (10 mg total) by mouth daily.   furosemide 40 MG tablet Commonly known as: LASIX Take 1 tablet (40 mg total) by mouth 2 (two) times daily. What changed: See the new instructions.   isosorbide mononitrate 30 MG 24 hr tablet Commonly known as: IMDUR TAKE 1 TABLET (30 MG TOTAL) BY MOUTH DAILY. NEED APPT FOR FUTURE REFILLS [phone]   Melatonin 10 MG Tabs Take 10 mg by mouth at  bedtime.   nystatin 100000 UNIT/ML suspension Commonly known as: MYCOSTATIN Take 5 mLs (500,000 Units total) by mouth 4 (four) times daily. As needed for thrush symptoms   ondansetron 4 MG tablet Commonly known as: ZOFRAN Take 1 tablet (4 mg total) by mouth every 6 (six) hours as needed for nausea.   polyethylene glycol 17 g packet Commonly known as: MIRALAX / GLYCOLAX Take 17 g by mouth daily as needed for mild constipation.   potassium chloride SA 20 MEQ tablet Commonly known as: Klor-Con M20 TAKE 2 TABLETS BY MOUTH DAILY. NEED APPT WITH DR [facility] FOR FUTURE REFILLS [phone] What changed:   how much to take  how to take this  when to take this   PRILOSEC PO  Take 1 capsule by mouth 2 (two) times daily.   senna-docusate 8.6-50 MG tablet Commonly known as: Senokot-S Take 2 tablets by mouth at bedtime as needed for mild constipation or moderate constipation.   simethicone 80 MG chewable tablet Commonly known as: MYLICON Chew 80 mg by mouth as needed for flatulence.   spironolactone 25 MG tablet Commonly known as: ALDACTONE Take 0.5 tablets (12.5 mg total) by mouth daily. Start taking on: April 28, 2019      Follow-up Information    [facility] Follow up on 05/14/2019.   Specialty: Cardiology Why: at 3;20 Contact information: [address] [id number] [place]       Martyn Ehrich, NP Follow up on 05/11/2019.   Specialty: Pulmonary Disease Why: Your appointment is at 10:30AM. Contact information: [place] [address] [phone]        [name], PA-C. Call.   Specialty: Family Medicine Why: Follow up within 1 week  Contact information: [place] [address] [phone]        [facility] Follow up.   Why: home oxygen Contact information: [phone]         Allergies  Allergen Reactions  .  Other Itching    Pet Dander   Consultations:  Cardiology  Pulmonology  Procedures/Studies: Dg Chest 2 View  Result Date: 04/21/2019 CLINICAL DATA:  Congestive failure EXAM: CHEST - 2 VIEW COMPARISON:  03/22/2019 FINDINGS: Cardiac shadow is stable. Patchy infiltrates are again identified bilaterally worse on the left than the right but stable from the prior study. These are increased however from a prior exam from 2019. No new focal abnormality is noted. IMPRESSION: Stable appearance of the chest when compare with the prior exam. Given the stability this is likely of a more chronic scarring appearance. Correlation with CT of the chest may be helpful. Electronically Signed   By: Inez Catalina M.D.   On: 04/21/2019 13:37   Ct Chest Wo Contrast  Result Date: 04/21/2019 CLINICAL DATA:  Pectus excavatum EXAM: CT CHEST WITHOUT CONTRAST TECHNIQUE: Multidetector CT imaging of the chest was performed following the standard protocol without IV contrast. COMPARISON:  CT chest May 04, 2018 FINDINGS: Cardiovascular: Evaluation of the vasculature is limited in the absence of contrast medium. There is redemonstration of marked central pulmonary arterial enlargement. The main PA measures up to 5.1 cm in maximum diameter, not significantly increased from prior. The heart itself is considerably enlarged and largely displaced towards the left chest in part due to mass effect from patient's a significant pectus excavatum deformity. Mediastinum/Nodes: Evaluation of hilar lymph nodes is limited in the absence of contrast medium. No enlarged mediastinal or axillary lymph nodes. Thyroid gland, trachea, and esophagus demonstrate no significant findings. Lungs/Pleura: There is a region of focal area masslike consolidation in the left upper measuring 3.2 x 3.5 by 3.6 cm in size (axial 43, coronal 98). Internal air bronchograms within this opacity. Additional consolidated lung is present throughout the left hemithorax much of  which is favored to reflect compressive atelectasis. Subsegmental atelectasis is present in the right lung as well. Scattered areas of bronchiectasis and architectural distortion are present in both lungs. Upper Abdomen: High attenuation in the renal pelves likely reflecting excreted contrast medium No acute abnormalities present in the visualized portions of the upper abdomen. Musculoskeletal: Straightening of the normal thoracic kyphosis with redemonstration of the significant pectus  deformity of the chest as well as truncation of the right third and fourth ribs and significant deformity of the remaining ribs. Haller index measures 3.9 compatible with severe pectus deformity. With mass effect upon the heart as detailed above. No suspicious osseous lesions are identified. IMPRESSION: 1. Severe pectus excavatum deformity of the chest with mass effect upon the heart, as described above. 2. Marked central pulmonary arterial enlargement, consistent with pulmonary arterial hypertension. 3. Focal area of masslike consolidation in the left upper lobe measuring 3.2 x 3.5 x 3.6 cm in size, with internal air bronchograms. While this could represent an area of infectious or inflammatory consolidation or rounded atelectasis, underlying malignancy cannot be excluded. Consider tissue sampling versus short-term interval follow-up particularly if following a therapeutic intervention such as antibiotics as PET would be insensitive to discerning infection versus inflammation or malignancy in this setting. 4. Additional areas of scattered consolidation with regions of bronchiectasis and architectural distortion in both lungs, could reflect a chronic infection or inflammatory process such as an organizing pneumonia given that milder findings were present on the comparison study. 5. High attenuation in the renal pelves bilaterally likely reflecting excreted contrast medium. Electronically Signed   By: Lovena Le M.D.   On: 04/21/2019  21:39   Dg Chest Port 1 View  Result Date: 04/25/2019 CLINICAL DATA:  Shortness of breath EXAM: PORTABLE CHEST 1 VIEW COMPARISON:  Yesterday FINDINGS: Unchanged cardiomegaly with left more than right lung opacification, much of the left basal density is from the enlarged heart based on recent chest CT. Dilated pulmonary arterial tree. No visible effusion or pneumothorax. IMPRESSION: 1. Cardiomegaly and pulmonary artery enlargement. 2. Unchanged pulmonary opacification. Electronically Signed   By: Monte Fantasia M.D.   On: 04/25/2019 07:49   Dg Chest Port 1 View  Result Date: 04/24/2019 CLINICAL DATA:  Shortness of breath EXAM: PORTABLE CHEST 1 VIEW COMPARISON:  Yesterday FINDINGS: Cardiomegaly with pulmonary artery enlargement. Opacified left chest which is primarily from cardiac enlargement based on recent chest CT. Streaky and airspace densities on both sides. IMPRESSION: 1. Chronic cardiomegaly and pulmonary artery dilatation. 2. Airspace disease that is stable from yesterday. Electronically Signed   By: Monte Fantasia M.D.   On: 04/24/2019 09:59   Dg Chest Port 1 View  Result Date: 04/23/2019 CLINICAL DATA:  History of asthma.  Congestive heart failure. EXAM: PORTABLE CHEST 1 VIEW COMPARISON:  April 21, 2019 FINDINGS: Hazy opacity in the right mid lung persists. More dense opacity in consolidation in the left mid lower lung is stable. No pneumothorax. No other changes. IMPRESSION: Bilateral pulmonary infiltrates are stable.  No interval changes. Electronically Signed   By: Dorise Bullion III M.D   On: 04/23/2019 11:05   Mr Cardiac Morphology W Wo Contrast  Result Date: 04/21/2019 CLINICAL DATA:  Cor pulmonale EXAM: CARDIAC MRI TECHNIQUE: The patient was scanned on a 1.5 Tesla GE magnet. A dedicated cardiac coil was used. Functional imaging was done using Fiesta sequences. 2,3, and 4 chamber views were done to assess for RWMA's. Modified Simpson's rule using a short axis stack was used to  calculate an ejection fraction on a dedicated work Conservation officer, nature. The patient received 10 cc of Gadavist. After 10 minutes inversion recovery sequences were used to assess for infiltration and scar tissue. FINDINGS: Markedly abnormal appearance of the chest cavity. Status post pectus repair. The left lung appears collapsed, compressed by the heart which is shifted leftwards. Full findings in the chest cavity will be  reported with the CT chest done today. Dilated (52 mm) main pulmonary artery. Small pericardial effusion. Normal left ventricular size and wall thickness. Mild diffuse hypokinesis with EF 52%. D-shaped interventricular septum with evidence for RV pressure/volume overload. The right ventricle was markedly dilated with moderately decreased systolic function, EF 59%. The left atrium was small and compressed between the aortic root/ascending aorta and the descending aorta/vertebral column. Severely dilated right atriuim. Mild to moderate pulmonic insufficiency. I was unable to visualize significant tricuspid regurgitation. Trileaflet aortic valve without significant stenosis or regurgitation. On delayed enhancement imaging, there is mid-wall late gadolinium enhancement (LGE) at the inferior and anterior RV insertion sites. Measurements: LVEDV 160 mL LVSV 84 mL LVEF 52% RVEDV 471 mL RVSV 147 mL RVEF 31% IMPRESSION: 1. The left lung appears collapsed, compressed by leftwards shift of the heart and dilated RV. 2.  Dilated main pulmonary artery. 3.  Normal LV size with mild diffuse hypokinesis, EF 52%. 4. Markedly dilated RV with EF 31%, moderate hypokinesis. D-shaped interventricular septum, suggestive of RV pressure/volume overload. 5. Small left atrium appears compressed between the aortic root/ascending aorta and the descending aorta/ventrebral column. 6. Nonspecific LGE pattern at the RV insertion sites, suggestive of RV pressure/volume overload. Dalton Mclean Electronically Signed   By:  Loralie Champagne M.D.   On: 04/21/2019 21:40   US Abdomen Limited Ruq  Result Date: 04/22/2019 CLINICAL DATA:  38 year old male with cirrhosis. EXAM: ULTRASOUND ABDOMEN LIMITED RIGHT UPPER QUADRANT COMPARISON:  08/08/2017 ultrasound and prior studies FINDINGS: Gallbladder: The gallbladder is unremarkable. There is no evidence of cholelithiasis or acute cholecystitis. Common bile duct: Diameter: 2.9 cm. No intrahepatic or extrahepatic biliary dilatation. Liver: Nodular hepatic contour is compatible cirrhosis. A 1.2 cm hyperechoic lesion within the RIGHT liver is noted, not identified on the 2018 study. No other focal hepatic abnormalities are noted. Portal vein is patent on color Doppler imaging with normal direction of blood flow towards the liver. Other: None. IMPRESSION: 1. Cirrhosis with 1.2 cm hyperechoic RIGHT hepatic lesion. This is not a typical appearance for hepatocellular carcinoma and may represent a hemangioma, but as this may be new since the prior study, MRI of the abdomen with and without contrast is recommended. 2. Unremarkable gallbladder.  No biliary dilatation. Electronically Signed   By: Margarette Canada M.D.   On: 04/22/2019 16:18   RHC on 04/21/2019  1. Mildly elevated right and left heart filling pressures.  2. Moderate pulmonary hypertension, suspect primarily pulmonary venous hypertension.  3. PAPI low but not markedly low, suggesting mild-moderate RV dysfunction.   ECHOCARDIOGRAM IMPRESSIONS   1. The right ventricle has moderately reduced systolic function. The cavity was severely enlarged. There is no increase in right ventricular wall thickness. D-shaped interventricular septum suggestive of RV pressure/volume overload. 2. Right atrial size was severely dilated. 3. The left ventricle has low normal systolic function, with an ejection fraction of 50-55%. The cavity size was normal. 4. Limited echo for bubble study. The bubble study was negative, no evidence for PFO or  ASD.  FINDINGS Left Ventricle: The left ventricle has low normal systolic function, with an ejection fraction of 50-55%. The cavity size was normal. There is no increase in left ventricular wall thickness.   Right Ventricle: The right ventricle has moderately reduced systolic function. The cavity was severely enlarged. There is no increase in right ventricular wall thickness.  Left Atrium: Left atrial size was normal in size.  Right Atrium: Right atrial size was severely dilated. Right atrial pressure is  estimated at 10 mmHg.  Interatrial Septum: Agitated saline contrast was given intravenously to evaluate for intracardiac shunting.  Antimicrobials: Anti-infectives (From admission, onward)   Start     Dose/Rate Route Frequency Ordered Stop   04/21/19 1100  cefTRIAXone (ROCEPHIN) 1 g in sodium chloride 0.9 % 100 mL IVPB     1 g 200 mL/hr over 30 Minutes Intravenous Every 24 hours 04/21/19 1008 04/23/19 2359   04/21/19 1015  azithromycin (ZITHROMAX) tablet 250 mg  Status:  Discontinued     250 mg Oral Daily 04/21/19 1008 04/21/19 1514     Subjective: Seen and examined at bedside was doing fairly well today.  Denies chest pain, lightheadedness or dizziness.  Was wearing his BiPAP with his new settings and tolerated it well.  No chest pain, lightheadedness or dizziness.  No other concerns at present time and was not confused and short of breath.  He will need to follow-up with PCP, pulmonology as well as cardiology in outpatient setting and is understandable and agreeable with the plan of care.  Discharge Exam: Vitals:   04/27/19 0823 04/27/19 1056  BP:  116/69  Pulse:    Resp:    Temp:  98.2 F (36.8 C)  SpO2: 94%    Vitals:   04/27/19 0636 04/27/19 0736 04/27/19 0823 04/27/19 1056  BP:  113/66  116/69  Pulse:      Resp:      Temp:  97.9 F (36.6 C)  98.2 F (36.8 C)  TempSrc:  Axillary  Oral  SpO2:   94%   Weight: 92.8 kg     Height:       General: Pt is alert,  awake, not in acute distress wearing the BiPAP Cardiovascular: RRR, S1/S2 +, no rubs, no gallops Respiratory: Diminished bilaterally, no wheezing, no rhonchi; Has a pectus deformity  Abdominal: Soft, NT, ND, bowel sounds + Extremities: no edema, no cyanosis  The results of significant diagnostics from this hospitalization (including imaging, microbiology, ancillary and laboratory) are listed below for reference.    Microbiology: No results found for this or any previous visit (from the past 240 hour(s)).   Labs: BNP (last 3 results) Recent Labs    04/26/19 0228  BNP 16.0   Basic Metabolic Panel: Recent Labs  Lab 04/23/19 0247 04/23/19 0948 04/24/19 0157 04/25/19 0238 04/26/19 0228 04/27/19 0522  NA 138  --  139 136 137 139  K 3.1*  --  3.3* 4.4 3.8 4.0  CL 89*  --  91* 93* 94* 92*  CO2 40*  --  41* 35* 38* 38*  GLUCOSE 128*  --  111* 114* 114* 99  BUN 10  --  11 11 13 10   CREATININE 0.66  --  0.68 0.64 0.62 0.60*  CALCIUM 8.5*  --  8.6* 8.6* 8.4* 8.6*  MG 2.0  --  2.2 2.0 1.9 1.9  PHOS  --  2.6 3.4 4.1 3.1 3.1   Liver Function Tests: Recent Labs  Lab 04/21/19 0235 04/24/19 0157 04/25/19 0238 04/26/19 0228 04/27/19 0522  AST 21 31 27 22 19   ALT 23 42 40 36 29  ALKPHOS 52 52 50 55 52  BILITOT 0.9 0.4 0.6 0.5 0.5  PROT 6.3* 6.7 6.4* 6.4* 6.3*  ALBUMIN 3.2* 3.4* 3.5 3.5 3.4*   No results for input(s): LIPASE, AMYLASE in the last 168 hours. No results for input(s): AMMONIA in the last 168 hours. CBC: Recent Labs  Lab 04/23/19 0247 04/24/19 0157 04/25/19 1093  04/26/19 0228 04/27/19 0522  WBC 5.6 5.8 4.6 5.8 4.1  NEUTROABS  --  3.7 2.9 4.0 2.5  HGB 14.3 13.7 14.0 13.1 12.7*  HCT 44.4 43.6 44.0 42.2 40.6  MCV 94.1 95.6 95.9 96.6 97.1  PLT 193 212 182 160 133*   Cardiac Enzymes: No results for input(s): CKTOTAL, CKMB, CKMBINDEX, TROPONINI in the last 168 hours. BNP: Invalid input(s): POCBNP CBG: No results for input(s): GLUCAP in the last 168  hours. D-Dimer No results for input(s): DDIMER in the last 72 hours. Hgb A1c Recent Labs    04/25/19 0238  HGBA1C 5.4   Lipid Profile No results for input(s): CHOL, HDL, LDLCALC, TRIG, CHOLHDL, LDLDIRECT in the last 72 hours. Thyroid function studies No results for input(s): TSH, T4TOTAL, T3FREE, THYROIDAB in the last 72 hours.  Invalid input(s): FREET3 Anemia work up No results for input(s): VITAMINB12, FOLATE, FERRITIN, TIBC, IRON, RETICCTPCT in the last 72 hours. Urinalysis No results found for: COLORURINE, APPEARANCEUR, LABSPEC, [place], GLUCOSEU, HGBUR, BILIRUBINUR, KETONESUR, PROTEINUR, UROBILINOGEN, NITRITE, LEUKOCYTESUR Sepsis Labs Invalid input(s): PROCALCITONIN,  WBC,  LACTICIDVEN Microbiology No results found for this or any previous visit (from the past 240 hour(s)).  Time coordinating discharge: 35 minutes  SIGNED:  Kerney Elbe, DO [facility] 04/27/2019, 9:26 PM Pager is on [place]  If 7PM-7AM, please contact night-coverage [email or web address] Password [email or web address]

## 2019-04-27 NOTE — Telephone Encounter (Signed)
Call returned to [facility], she states they have gotten everything taken care, they have a RT coming out today. Made her aware if she needs anything else to let us know. Voiced understanding. Nothing further needed at this time.

## 2019-04-27 NOTE — Progress Notes (Signed)
NAME:  [name], MRN:  [id number], DOB:  1981-04-25, LOS: 7 ADMISSION DATE:  04/20/2019, CONSULTATION DATE:  8/12 REFERRING MD:  Dr. 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  Pulmonary consulted for restrictive lung disease.  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 He has had two reconstructive surgeries for his pectus, first in childhood, and most recently in 2011 a redo at [facility]. He was re-evaluated in 2019 for further surgical options at [facility], where it was determined there were none. He is also followed by the advanced CHF clinic and has been worked up with cardiac MRI in the past, demonstrating LA compression from pectus. On last visit in 03/2019 his lasix dose was adjusted and he was scheduled for RHC on 8/11, however, he required hospitalization.   [place] ([place]) with complaints of progressive dyspnea x months. Upon presentation he was in significant respiratory distress with hypoxemic and hypercarbic failure on ABG. CTA was negative for PE, but concerning for RLL infectious process +/- pulmonary edema. He ultimately required intubation. Treated with ceftriaxone/azithromycin and diuresis and was extubated on 8/10. He was then transferred to [facility] on 8/11 for ongoing workup including RHC. PCCM consulted for chronic respiratory failure.    Past Medical History   has a past medical history of Asthma, CHF (congestive heart failure) ([place]), Chronic hypercapnic respiratory failure ([place]), Pectus  excavatum, and Restrictive lung disease.   Significant Hospital Events   8/7 > 8/10 admit to OSH for pulm edema and CAP requiring intuabtion 8/10 extubated 8/11 transfer to [facility] 8/12 [facility] 8/16 bipap increased to 14/6 8/17  increasing acidosis   Consults:  CHF Pulmonary  Procedures:    Significant Diagnostic Tests:  [facility] 8/12 >  Micro Data:    Antimicrobials:  Ceftriaxone 8/8 > Azithromycin 8/8 > off  Interim history/subjective:   Headache resolved. Able to sleep with AVAPS settings   Objective   Blood pressure 116/69, pulse 69, temperature 98.2 F (36.8 C), temperature source Oral, resp. rate 16, height 6\' 4"  (1.93 m), weight 92.8 kg, SpO2 94 %.    FiO2 (%):  [35 %] 35 %   Intake/Output Summary (Last 24 hours) at 04/27/2019 1136 Last data filed at 04/27/2019 0900 Gross per 24 hour  Intake 240 ml  Output -  Net 240 ml   Filed Weights   04/25/19 0300 04/26/19 0300 04/27/19 0636  Weight: 92.6 kg 93 kg 92.8 kg    Examination: General: No acute distress.  Able to speak in full sentences HEENT: No JVD or lymphadenopathy is appreciated Neuro: Grossly intact, alert and interactive, no tremors CV: Heart sounds are regular regular rate and rhythm, pectus excavatum, multiple surgical scars PULM: Decreased breath sounds at bases.  Currently on BiPAP with good facial seal. GI: soft, bsx4 active  Extremities: warm/dry, negative edema  Skin: no rashes or lesions   Resolved Hospital Problem list     Assessment & Plan:   Acute on chronic hypoxemic and hypercarbic respiratory failure: in  the setting of restrictive lung disease due to pectus excavatum. Intubated from 8/7 to 8/10 at OSH for pulmonary edema +/- CAP improving with diuresis and antibiotics.  Collapsed left lobe -due to enlarged RV and pectus Plan  he seems to have done much better with AVAPS settings -acute respiratory acidosis is improving dc acetazolamide. My partner Dr. [name] has reached out  to surgeon at [facility], [place] and was directed to contact another surgeon in [place] doubt that he would have redo surgical possibilities for pectus excavatum He can be discharged with a trilogy device with auto APAP settings, needs pulmonary outpatient follow-up in 2 weeks   Pulmonary hypertension multifactorial in the setting of chronic hypoxia and LA compression.  Status post right heart cath on 04/21/2019 with mild elevated right and left heart filling pressures.  Moderate pulmonary hypertension suspect primary pulmonary venous hypertension.  Mild to moderate RV dysfunction -Continue Lasix 40 twice daily and Aldactone  Kara Mead MD. FCCP. [facility] Pager [phone] If no response call [phone]    04/27/2019, 11:36 AM

## 2019-04-27 NOTE — Progress Notes (Signed)
The patient continues to exhibit signs of hypercapnia associated with chronic respiratory failure secondary to a restrictive thoracic disorder from his pectus excavatum.  Patient requires the use of NIV both nightly and daytime to help with exacerbation periods. The use of NIV Trilogy device with auto APAP settings will treat the patient's high levels PCO2 levels and can reduce the risk of exacerbation to future hospitalizations and use at night and during the day.  The patient will need these advanced settings in conjunction with their current medication regimen.  BiPAP is not an option due to his functional limitations and severity of the patient's condition.  Failure to have the NIV available for use over a 24-hour period could lead to death.  Patient is able to protect her airway to clear their own airway on her own.

## 2019-04-30 ENCOUNTER — Telehealth: Payer: Self-pay | Admitting: Primary Care

## 2019-04-30 ENCOUNTER — Telehealth (HOSPITAL_COMMUNITY): Payer: Self-pay

## 2019-04-30 ENCOUNTER — Other Ambulatory Visit: Payer: Self-pay | Admitting: Primary Care

## 2019-04-30 MED ORDER — POTASSIUM CHLORIDE CRYS ER 20 MEQ PO TBCR: EXTENDED_RELEASE_TABLET | ORAL | 0 refills | Status: DC

## 2019-04-30 NOTE — Telephone Encounter (Signed)
Yes you can refill this once, otherwise they are to manage

## 2019-04-30 NOTE — Telephone Encounter (Signed)
Patient calling for refill of potassium. HE states Dr. Claris Gladden is gone. And he isn't going to have enough to make to follow up appt with BW on 05/11/19.   Beth please advise if we can send in refill.

## 2019-04-30 NOTE — Telephone Encounter (Signed)
Called let patient him know we would refill one time, but he needed to follow up with Dr. Claris Gladden office for future refills.   Patient voiced understanding.  Nothing further needed at this time.

## 2019-04-30 NOTE — Telephone Encounter (Signed)
Pt called to receive update on fmla paper work for both him and his wife. Made pt aware of the paper work being in process and is currently waiting on Dr. Claris Gladden signature. Pt verbalized understanding and is agreeable. Will call pt when paper work is complete.

## 2019-05-04 NOTE — Telephone Encounter (Signed)
Called and spoke with pt. Pt's wife's FMLA paper was faxed to her work. Copy made to be scanned in chart. Original to be picked up by pt. Pt's work disability paperwork was completed and signed to be picked up with the pt's wife's paper work. Copy to be scanned into chart.

## 2019-05-05 ENCOUNTER — Other Ambulatory Visit (HOSPITAL_COMMUNITY): Payer: Self-pay

## 2019-05-05 DIAGNOSIS — I509 Heart failure, unspecified: Secondary | ICD-10-CM

## 2019-05-05 MED ORDER — ISOSORBIDE MONONITRATE ER 30 MG PO TB24: 30.0000 mg | ORAL_TABLET | Freq: Every day | ORAL | 0 refills | Status: DC

## 2019-05-09 ENCOUNTER — Other Ambulatory Visit: Payer: Self-pay | Admitting: Primary Care

## 2019-05-11 ENCOUNTER — Telehealth: Payer: Self-pay

## 2019-05-11 ENCOUNTER — Ambulatory Visit: Payer: Managed Care, Other (non HMO) | Admitting: Primary Care

## 2019-05-11 ENCOUNTER — Other Ambulatory Visit: Payer: Self-pay

## 2019-05-11 ENCOUNTER — Encounter: Payer: Self-pay | Admitting: Primary Care

## 2019-05-11 VITALS — BP 110/78 | HR 76 | Ht 76.0 in | Wt 208.0 lb

## 2019-05-11 DIAGNOSIS — J9612 Chronic respiratory failure with hypercapnia: Secondary | ICD-10-CM

## 2019-05-11 DIAGNOSIS — Q676 Pectus excavatum: Secondary | ICD-10-CM | POA: Diagnosis not present

## 2019-05-11 DIAGNOSIS — J9611 Chronic respiratory failure with hypoxia: Secondary | ICD-10-CM

## 2019-05-11 DIAGNOSIS — I5032 Chronic diastolic (congestive) heart failure: Secondary | ICD-10-CM

## 2019-05-11 DIAGNOSIS — J181 Lobar pneumonia, unspecified organism: Secondary | ICD-10-CM

## 2019-05-11 DIAGNOSIS — Z23 Encounter for immunization: Secondary | ICD-10-CM | POA: Diagnosis not present

## 2019-05-11 DIAGNOSIS — J984 Other disorders of lung: Secondary | ICD-10-CM

## 2019-05-11 DIAGNOSIS — J849 Interstitial pulmonary disease, unspecified: Secondary | ICD-10-CM

## 2019-05-11 DIAGNOSIS — J189 Pneumonia, unspecified organism: Secondary | ICD-10-CM

## 2019-05-11 DIAGNOSIS — G5692 Unspecified mononeuropathy of left upper limb: Secondary | ICD-10-CM | POA: Insufficient documentation

## 2019-05-11 NOTE — Assessment & Plan Note (Signed)
-   He has had 2 reconstructive surgeries for his pectus excavatum, first in childhood at the age of 64 (Dr. Lilia Pro in [place]) and again in 2011 with [facility]  - He was re-evaluated in 2019 for further surgical options at [facility] where it was determined that there were none - Referral placed today to Dr. [name] in [place] for second opinion

## 2019-05-11 NOTE — Assessment & Plan Note (Addendum)
-   Stable, no cough. Afebrile - CT chest 8/12 showed focal area masslike consolidation in LUL which could represent infection or inflammatory, underlying malignancy cannot be ruled out. RLL pneumonia/LUL pneumonia vs mass - Completed 5 days azithromycin and 8 days Rocephin - Ordered for repeat CT chest wo contrast in 2 months

## 2019-05-11 NOTE — Assessment & Plan Note (Signed)
-   Possibly from ABGs - Symptoms can take 2-3 months to improve from nerve damage  - If left hand continues to have numbness, consider referral to physical therapy or neurology

## 2019-05-11 NOTE — Assessment & Plan Note (Addendum)
-   Recent hospital admission in August 2020 for acute on chronic respiratory failure with hypoxemia and hypercapnia with a PCO2 greater than 100 - ABGs 8/18 on 3L showed pH 7.3, pCO2 88.1, bicarb 43.7 - Reports compliance with Trilogy NIV with auto AVAPS setting and compliance with oxygen - Taking lasix 40mg  twice daily and spirolactone 12.5mg  daily  - CMET on 8/26 with PCP showed CO2 41 - Needs repeat ABGs (goal pCO2 60-70s) ? Diamox

## 2019-05-11 NOTE — Progress Notes (Signed)
@Patient  ID: [name], male    DOB: Jun 11, 1981, 38 y.o.   MRN: [id number]  Chief Complaint  Patient presents with  . Hospitalization Follow-up    ED visit 08/11 for CHF. Now on oxygen and trilogy vent.     Referring provider: [name], PA-C  HPI: 38 year old male, former smoker quit 2008 (1 pack year history).  Past medical history significant for pectus excavatum, strict of lung disease, chronic respiratory failure, diastolic CHF, pulmonary hypertension (group 2, group 3).  Patient of Dr. Lake Bells, last seen by MD on 05/28/2018. He has had 2 reconstructive surgeries for his pectus excavatum, first in childhood at the age of 89 (Dr. Lilia Pro in [place]) and again in 2011 with [facility].  He was reevaluated in 2019 for further surgical options at [facility] where it was determined that there were none.  He is maintained on continuous oxygen 24/7 at 3 L and BiPAP QHS.  Compliance with oxygen is always been a little bit of a struggle.  He also follows with heart failure clinic and is on oral Lasix. Recent hospital admission for acute on chronic respiratory failure, hypercapnia with a PCO2 greater than 100.  Hospital course: Patient was transferred to [facility] on August 11 from outside hospital in [place] where he was admitted and intubated for hypoxic and hyper cardiac respiratory failure.  PCO2 was noted to be greater than 100.  CTA negative for PE but concerning for right lower lobe infectious process and pulmonary edema.  Treated with ceftriaxone and azithromycin.  IV diuresis per CHF service.  He was extubated on August 10th and transferred to [facility] on August 11 for planned right heart cath.  Right heart cath performed on August 12 which showed moderate pulmonary hypertension. MRI was severe RV failure/EF 31%, anatomic distortions of heart and lung due to pectus excavatum.  Dr. [name] recommends referral to pectus excavatum specialist by the  name of Dr. [name] in [place] .  BiPAP changed to trilogy NIV with auto AVAPS setting. Discharge ABGs 8/18 on 3L showed pH 7.3, pCO2 88.1, bicarb 43.7  05/11/2019 Patient presents today for hospital follow-up.  Accompanied by wife on phone call.  Patient reports that he is feeling well today.  His breathing has improved.  No reported cough or wheezing. Continue Symbicort twice daily and as needed Ventolin HFA.   He is wearing 2 L pulsed oxygen today. Reports compliance with continuous oxygen and new trilogy vent.  Wife confirms that patient has been using ventilator every night with full facemask.  Respiratory therapy will be out in 1 month for follow-up and then every 3 months.  He had labs done with primarycare that showed CO2 of 41.  He is interested in a second opinion regarding pectus excavatum and would like referral to Dr. Sabra Heck in [place].  He has a follow-up with cardiology this Friday, September 4.  He continues with oral Lasix 40 mg twice daily and spironolactone 12.5 mg daily.  He is in the middle of applying for disability. Labs done with PCP showed venous CO2 41.      Allergies  Allergen Reactions  . Other Itching    Pet Dander    Immunization History  Administered Date(s) Administered  . Influenza Split 06/16/2012  . Influenza Whole 06/09/2010, 06/10/2011  . Influenza,inj,Quad PF,6+ Mos 06/08/2013, 08/10/2015, 05/28/2018, 05/11/2019  . Influenza-Unspecified 06/09/2014    Past Medical History:  Diagnosis Date  . Asthma   .  CHF (congestive heart failure) ([place])   . Chronic hypercapnic respiratory failure (HCC)    nocturnal  hypoxemic resp failure, on bipap  . Pectus excavatum   . Restrictive lung disease     Tobacco History: Social History   Tobacco Use  Smoking Status Former Smoker  . Packs/day: 0.10  . Years: 1.00  . Pack years: 0.10  . Types: Cigarettes  . Quit date: 09/09/2006  . Years since quitting: 12.6  Smokeless Tobacco Never Used   Counseling given:  Not Answered   Outpatient Medications Prior to Visit  Medication Sig Dispense Refill  . acetaminophen (TYLENOL) 500 MG tablet Take 1,000 mg by mouth every 6 (six) hours as needed for mild pain.    Marland Kitchen albuterol (PROAIR HFA) 108 (90 BASE) MCG/ACT inhaler Inhale 2 puffs into the lungs every 6 (six) hours as needed for wheezing or shortness of breath. 8.5 each 0  . albuterol (PROVENTIL) (2.5 MG/3ML) 0.083% nebulizer solution Take 3 mLs (2.5 mg total) by nebulization every 6 (six) hours as needed for wheezing or shortness of breath. 75 mL 11  . amphetamine-dextroamphetamine (ADDERALL) 10 MG tablet Take 10 mg by mouth 4 (four) times daily as needed.    . bisoprolol (ZEBETA) 5 MG tablet TAKE 1 TABLET BY MOUTH EVERY DAY 90 tablet 0  . budesonide-formoterol (SYMBICORT) 80-4.5 MCG/ACT inhaler Inhale 2 puffs into the lungs 2 (two) times a day. 2 Inhaler 0  . cetirizine (ZYRTEC) 10 MG tablet Take 1 tablet (10 mg total) by mouth daily. 30 tablet 2  . furosemide (LASIX) 40 MG tablet Take 1 tablet (40 mg total) by mouth 2 (two) times daily. 60 tablet 0  . isosorbide mononitrate (IMDUR) 30 MG 24 hr tablet Take 1 tablet (30 mg total) by mouth daily. Need appt for future refills [phone] 30 tablet 0  . Melatonin 10 MG TABS Take 10 mg by mouth at bedtime.    Marland Kitchen nystatin (MYCOSTATIN) 100000 UNIT/ML suspension Take 5 mLs (500,000 Units total) by mouth 4 (four) times daily. As needed for thrush symptoms 60 mL 0  . Omeprazole (PRILOSEC PO) Take 1 capsule by mouth 2 (two) times daily.     . ondansetron (ZOFRAN) 4 MG tablet Take 1 tablet (4 mg total) by mouth every 6 (six) hours as needed for nausea. 20 tablet 0  . polyethylene glycol (MIRALAX / GLYCOLAX) 17 g packet Take 17 g by mouth daily as needed for mild constipation. 14 each 0  . potassium chloride SA (KLOR-CON M20) 20 MEQ tablet TAKE 2 TABLETS BY MOUTH DAILY. NEED APPT WITH DR [facility] FOR FUTURE REFILLS [phone] 30 tablet 0  . senna-docusate (SENOKOT-S)  8.6-50 MG tablet Take 2 tablets by mouth at bedtime as needed for mild constipation or moderate constipation. 300 tablet 0  . simethicone (MYLICON) 80 MG chewable tablet Chew 80 mg by mouth as needed for flatulence.     Marland Kitchen spironolactone (ALDACTONE) 25 MG tablet Take 0.5 tablets (12.5 mg total) by mouth daily. 30 tablet 0   No facility-administered medications prior to visit.     Review of Systems  Review of Systems  Constitutional: Negative.   HENT: Negative.   Respiratory: Negative for cough, shortness of breath and wheezing.   Cardiovascular: Negative.     Physical Exam  BP 110/78   Pulse 76   Ht 6\' 4"  (1.93 m)   Wt 208 lb (94.3 kg)   SpO2 98%   BMI 25.32 kg/m  Physical Exam Constitutional:  General: He is not in acute distress.    Appearance: Normal appearance. He is not ill-appearing or diaphoretic.     Comments: Appears well   HENT:     Head: Normocephalic and atraumatic.     Mouth/Throat:     Mouth: Mucous membranes are moist.     Pharynx: Oropharynx is clear.  Cardiovascular:     Rate and Rhythm: Normal rate and regular rhythm.  Pulmonary:     Effort: Pulmonary effort is normal.     Breath sounds: Normal breath sounds. No wheezing or rhonchi.     Comments: CTA, O2 98% 2L pulsed  Musculoskeletal: Normal range of motion.  Skin:    General: Skin is warm and dry.  Neurological:     General: No focal deficit present.     Mental Status: He is alert and oriented to person, place, and time. Mental status is at baseline.  Psychiatric:        Mood and Affect: Mood normal.        Behavior: Behavior normal.        Thought Content: Thought content normal.        Judgment: Judgment normal.      Lab Results:  CBC    Component Value Date/Time   WBC 4.1 04/27/2019 0522   RBC 4.18 (L) 04/27/2019 0522   HGB 12.7 (L) 04/27/2019 0522   HCT 40.6 04/27/2019 0522   PLT 133 (L) 04/27/2019 0522   MCV 97.1 04/27/2019 0522   MCH 30.4 04/27/2019 0522   MCHC 31.3  04/27/2019 0522   RDW 13.2 04/27/2019 0522   LYMPHSABS 1.0 04/27/2019 0522   MONOABS 0.6 04/27/2019 0522   EOSABS 0.0 04/27/2019 0522   BASOSABS 0.0 04/27/2019 0522    BMET    Component Value Date/Time   NA 139 04/27/2019 0522   K 4.0 04/27/2019 0522   CL 92 (L) 04/27/2019 0522   CO2 38 (H) 04/27/2019 0522   GLUCOSE 99 04/27/2019 0522   BUN 10 04/27/2019 0522   CREATININE 0.60 (L) 04/27/2019 0522   CALCIUM 8.6 (L) 04/27/2019 0522   GFRNONAA >60 04/27/2019 0522   GFRAA >60 04/27/2019 0522    BNP    Component Value Date/Time   BNP 60.6 04/26/2019 0228    ProBNP    Component Value Date/Time   PROBNP 62.0 03/22/2019 1012    Imaging: Dg Chest 2 View  Result Date: 04/21/2019 CLINICAL DATA:  Congestive failure EXAM: CHEST - 2 VIEW COMPARISON:  03/22/2019 FINDINGS: Cardiac shadow is stable. Patchy infiltrates are again identified bilaterally worse on the left than the right but stable from the prior study. These are increased however from a prior exam from 2019. No new focal abnormality is noted. IMPRESSION: Stable appearance of the chest when compare with the prior exam. Given the stability this is likely of a more chronic scarring appearance. Correlation with CT of the chest may be helpful. Electronically Signed   By: Inez Catalina M.D.   On: 04/21/2019 13:37   Ct Chest Wo Contrast  Result Date: 04/21/2019 CLINICAL DATA:  Pectus excavatum EXAM: CT CHEST WITHOUT CONTRAST TECHNIQUE: Multidetector CT imaging of the chest was performed following the standard protocol without IV contrast. COMPARISON:  CT chest May 04, 2018 FINDINGS: Cardiovascular: Evaluation of the vasculature is limited in the absence of contrast medium. There is redemonstration of marked central pulmonary arterial enlargement. The main PA measures up to 5.1 cm in maximum diameter, not significantly increased from prior. The  heart itself is considerably enlarged and largely displaced towards the left chest in part  due to mass effect from patient's a significant pectus excavatum deformity. Mediastinum/Nodes: Evaluation of hilar lymph nodes is limited in the absence of contrast medium. No enlarged mediastinal or axillary lymph nodes. Thyroid gland, trachea, and esophagus demonstrate no significant findings. Lungs/Pleura: There is a region of focal area masslike consolidation in the left upper measuring 3.2 x 3.5 by 3.6 cm in size (axial 43, coronal 98). Internal air bronchograms within this opacity. Additional consolidated lung is present throughout the left hemithorax much of which is favored to reflect compressive atelectasis. Subsegmental atelectasis is present in the right lung as well. Scattered areas of bronchiectasis and architectural distortion are present in both lungs. Upper Abdomen: High attenuation in the renal pelves likely reflecting excreted contrast medium No acute abnormalities present in the visualized portions of the upper abdomen. Musculoskeletal: Straightening of the normal thoracic kyphosis with redemonstration of the significant pectus deformity of the chest as well as truncation of the right third and fourth ribs and significant deformity of the remaining ribs. Haller index measures 3.9 compatible with severe pectus deformity. With mass effect upon the heart as detailed above. No suspicious osseous lesions are identified. IMPRESSION: 1. Severe pectus excavatum deformity of the chest with mass effect upon the heart, as described above. 2. Marked central pulmonary arterial enlargement, consistent with pulmonary arterial hypertension. 3. Focal area of masslike consolidation in the left upper lobe measuring 3.2 x 3.5 x 3.6 cm in size, with internal air bronchograms. While this could represent an area of infectious or inflammatory consolidation or rounded atelectasis, underlying malignancy cannot be excluded. Consider tissue sampling versus short-term interval follow-up particularly if following a therapeutic  intervention such as antibiotics as PET would be insensitive to discerning infection versus inflammation or malignancy in this setting. 4. Additional areas of scattered consolidation with regions of bronchiectasis and architectural distortion in both lungs, could reflect a chronic infection or inflammatory process such as an organizing pneumonia given that milder findings were present on the comparison study. 5. High attenuation in the renal pelves bilaterally likely reflecting excreted contrast medium. Electronically Signed   By: Lovena Le M.D.   On: 04/21/2019 21:39   Dg Chest Port 1 View  Result Date: 04/25/2019 CLINICAL DATA:  Shortness of breath EXAM: PORTABLE CHEST 1 VIEW COMPARISON:  Yesterday FINDINGS: Unchanged cardiomegaly with left more than right lung opacification, much of the left basal density is from the enlarged heart based on recent chest CT. Dilated pulmonary arterial tree. No visible effusion or pneumothorax. IMPRESSION: 1. Cardiomegaly and pulmonary artery enlargement. 2. Unchanged pulmonary opacification. Electronically Signed   By: Monte Fantasia M.D.   On: 04/25/2019 07:49   Dg Chest Port 1 View  Result Date: 04/24/2019 CLINICAL DATA:  Shortness of breath EXAM: PORTABLE CHEST 1 VIEW COMPARISON:  Yesterday FINDINGS: Cardiomegaly with pulmonary artery enlargement. Opacified left chest which is primarily from cardiac enlargement based on recent chest CT. Streaky and airspace densities on both sides. IMPRESSION: 1. Chronic cardiomegaly and pulmonary artery dilatation. 2. Airspace disease that is stable from yesterday. Electronically Signed   By: Monte Fantasia M.D.   On: 04/24/2019 09:59   Dg Chest Port 1 View  Result Date: 04/23/2019 CLINICAL DATA:  History of asthma.  Congestive heart failure. EXAM: PORTABLE CHEST 1 VIEW COMPARISON:  April 21, 2019 FINDINGS: Hazy opacity in the right mid lung persists. More dense opacity in consolidation in the left mid lower  lung is stable.  No pneumothorax. No other changes. IMPRESSION: Bilateral pulmonary infiltrates are stable.  No interval changes. Electronically Signed   By: Dorise Bullion III M.D   On: 04/23/2019 11:05   Mr Cardiac Morphology W Wo Contrast  Result Date: 04/21/2019 CLINICAL DATA:  Cor pulmonale EXAM: CARDIAC MRI TECHNIQUE: The patient was scanned on a 1.5 Tesla GE magnet. A dedicated cardiac coil was used. Functional imaging was done using Fiesta sequences. 2,3, and 4 chamber views were done to assess for RWMA's. Modified Simpson's rule using a short axis stack was used to calculate an ejection fraction on a dedicated work Conservation officer, nature. The patient received 10 cc of Gadavist. After 10 minutes inversion recovery sequences were used to assess for infiltration and scar tissue. FINDINGS: Markedly abnormal appearance of the chest cavity. Status post pectus repair. The left lung appears collapsed, compressed by the heart which is shifted leftwards. Full findings in the chest cavity will be reported with the CT chest done today. Dilated (52 mm) main pulmonary artery. Small pericardial effusion. Normal left ventricular size and wall thickness. Mild diffuse hypokinesis with EF 52%. D-shaped interventricular septum with evidence for RV pressure/volume overload. The right ventricle was markedly dilated with moderately decreased systolic function, EF 123456. The left atrium was small and compressed between the aortic root/ascending aorta and the descending aorta/vertebral column. Severely dilated right atriuim. Mild to moderate pulmonic insufficiency. I was unable to visualize significant tricuspid regurgitation. Trileaflet aortic valve without significant stenosis or regurgitation. On delayed enhancement imaging, there is mid-wall late gadolinium enhancement (LGE) at the inferior and anterior RV insertion sites. Measurements: LVEDV 160 mL LVSV 84 mL LVEF 52% RVEDV 471 mL RVSV 147 mL RVEF 31% IMPRESSION: 1. The left lung  appears collapsed, compressed by leftwards shift of the heart and dilated RV. 2.  Dilated main pulmonary artery. 3.  Normal LV size with mild diffuse hypokinesis, EF 52%. 4. Markedly dilated RV with EF 31%, moderate hypokinesis. D-shaped interventricular septum, suggestive of RV pressure/volume overload. 5. Small left atrium appears compressed between the aortic root/ascending aorta and the descending aorta/ventrebral column. 6. Nonspecific LGE pattern at the RV insertion sites, suggestive of RV pressure/volume overload. Dalton Mclean Electronically Signed   By: Loralie Champagne M.D.   On: 04/21/2019 21:40   US Abdomen Limited Ruq  Result Date: 04/22/2019 CLINICAL DATA:  38 year old male with cirrhosis. EXAM: ULTRASOUND ABDOMEN LIMITED RIGHT UPPER QUADRANT COMPARISON:  08/08/2017 ultrasound and prior studies FINDINGS: Gallbladder: The gallbladder is unremarkable. There is no evidence of cholelithiasis or acute cholecystitis. Common bile duct: Diameter: 2.9 cm. No intrahepatic or extrahepatic biliary dilatation. Liver: Nodular hepatic contour is compatible cirrhosis. A 1.2 cm hyperechoic lesion within the RIGHT liver is noted, not identified on the 2018 study. No other focal hepatic abnormalities are noted. Portal vein is patent on color Doppler imaging with normal direction of blood flow towards the liver. Other: None. IMPRESSION: 1. Cirrhosis with 1.2 cm hyperechoic RIGHT hepatic lesion. This is not a typical appearance for hepatocellular carcinoma and may represent a hemangioma, but as this may be new since the prior study, MRI of the abdomen with and without contrast is recommended. 2. Unremarkable gallbladder.  No biliary dilatation. Electronically Signed   By: Margarette [place] M.D.   On: 04/22/2019 16:18     Assessment & Plan:   Restrictive lung disease - Due to pectus excavatum  - Continue Symbicort 80 twice daily; prn albuterol hfa - Bipap changed to Trilogy in  August 2020 - Continue 24/7 oxygen 2-3 L  to keep O2 >88-90% - Referral pulmonary rehab re: restrictive lung disease/ pectus excavatum   Pectus excavatum - He has had 2 reconstructive surgeries for his pectus excavatum, first in childhood at the age of 66 (Dr. Lilia Pro in [place]) and again in 2011 with [facility]  - He was re-evaluated in 2019 for further surgical options at [facility] where it was determined that there were none - Referral placed today to Dr. [name] in [place] for second opinion   Chronic respiratory failure with hypoxia and hypercapnia [facility]) - Recent hospital admission in August 2020 for acute on chronic respiratory failure with hypoxemia and hypercapnia with a PCO2 greater than 100 - ABGs 8/18 on 3L showed pH 7.3, pCO2 88.1, bicarb 43.7 - Reports compliance with Trilogy NIV with auto AVAPS setting and compliance with oxygen - Taking lasix 40mg  twice daily and spirolactone 12.5mg  daily  - CMET on 8/26 with PCP showed CO2 41 - Needs repeat ABGs (goal pCO2 60-70s) ? Diamox    Chronic diastolic CHF (congestive heart failure) (HCC) - Continues lasix 40mg  twice daily and spirolactone 12.5mg  daily - Following with HF clinic, has an apt 9/4   Left upper lobe pneumonia (HCC) - Stable, no cough. Afebrile - CT chest 8/12 showed focal area masslike consolidation in LUL which could represent infection or inflammatory, underlying malignancy cannot be ruled out. 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

## 2019-05-11 NOTE — Patient Instructions (Addendum)
Recommendations: - Continue Oxygen 2-3 L continuous at all times to keep O2 >88-90% - Continue Trilogy ventilator EVERY night  - Continue Symbicort 80 two puffs twice daily - Use albuterol rescue inhaler 2 puffs every 6 hours for breath shortness of breath/wheezing  Left hand neuropathy: - If left hand continues to have numbness, consider referral to physical therapy or neurology. - Symptoms can take 2-3 months to improve from nerve damage   Referral: - Cardiothoracic surgery RE: Pectus excavatum (Dr. [name] in [place]) - Pulmonary rehab re: restrictive lung disease/ pectus excavatum   Orders: - Needs Trilogy download in 30-60 days (Respiratory therapist to come back out in 1 month and then every 3 months) - CT chest wo contrast re: LUL pneumonia   Follow-up: - Cardiology on Friday 9/4 - 6-8 weeks with Dr. Glenis Smoker (former BQ patient)

## 2019-05-11 NOTE — Assessment & Plan Note (Signed)
-   Continues lasix 40mg  twice daily and spirolactone 12.5mg  daily - Following with HF clinic, has an apt 9/4

## 2019-05-11 NOTE — Telephone Encounter (Signed)
Please order patient a repeat ABG per [name].

## 2019-05-11 NOTE — Assessment & Plan Note (Addendum)
-   Due to pectus excavatum  - Continue Symbicort 80 twice daily; prn albuterol hfa - Bipap changed to Trilogy in August 2020 - Continue 24/7 oxygen 2-3 L to keep O2 >88-90% - Referral pulmonary rehab re: restrictive lung disease/ pectus excavatum

## 2019-05-12 ENCOUNTER — Telehealth: Payer: Self-pay | Admitting: Internal Medicine

## 2019-05-12 NOTE — Telephone Encounter (Signed)
Order placed. Nothing further needed at this time. 

## 2019-05-12 NOTE — Telephone Encounter (Signed)
[name]   Please get a CD ROM of his CT scan. I want to send it to thoracic surgeon in [facility] - this is an issue I am following up from the hospital stay in August 2020.  Once done needs to get mailed out to.  I will wrap up at that point and then d/w Dr Lake Bells how he wants to handle care moving forward    THanks  MR    .....................................................................................Marland Kitchen DR Threasa Alpha [address] [place] [address]   Threasa Alpha, MD, [facility] Associate Professor of [place] and Interstitial Lung Disease Program Director of [place], Section of [place], Office [id number] [place], [place], [place] Phone: [phone] Fax: [phone]

## 2019-05-12 NOTE — Progress Notes (Signed)
Reviewed, agree 

## 2019-05-13 NOTE — Telephone Encounter (Signed)
Disc has been received and placed in outgoing mail for [employer] and requested a receipt. Will keep message open and in my box to follow.

## 2019-05-13 NOTE — Telephone Encounter (Signed)
Called and spoke to [facility] with [facility] Radiology. She states their CD ROM machine is down. Called [facility] radiology and was advised they will burn the images to a disc and have it couried to our office. Will keep message open to follow up on.

## 2019-05-13 NOTE — Telephone Encounter (Signed)
05/13/2019 1314  I am following up on this patient on behalf of [facility] as she is out of office.  PCC's can we please check on the status of the patient's ABG order?  Wyn Quaker, FNP

## 2019-05-13 NOTE — Telephone Encounter (Addendum)
I called Respiratory Therapy yesterday morning and had to leave a vm for them to call the pt to set up appt.  I checked this morning and saw that it has not been scheduled yet.  I just called again and left another vm for someone to please call the pt and schedule ABG.  Left my phone # for call back if there are any questions.

## 2019-05-14 ENCOUNTER — Ambulatory Visit (HOSPITAL_COMMUNITY)
Admission: RE | Admit: 2019-05-14 | Discharge: 2019-05-14 | Disposition: A | Discharge status: Home / Self Care | Payer: Managed Care, Other (non HMO) | Source: Ambulatory Visit | Attending: Primary Care | Admitting: Primary Care

## 2019-05-14 ENCOUNTER — Telehealth (HOSPITAL_COMMUNITY): Payer: Self-pay | Admitting: Emergency Medicine

## 2019-05-14 ENCOUNTER — Other Ambulatory Visit: Payer: Self-pay

## 2019-05-14 ENCOUNTER — Encounter (HOSPITAL_COMMUNITY): Payer: Self-pay | Admitting: Cardiology

## 2019-05-14 ENCOUNTER — Ambulatory Visit (HOSPITAL_COMMUNITY)
Admission: RE | Admit: 2019-05-14 | Discharge: 2019-05-14 | Disposition: A | Discharge status: Home / Self Care | Payer: Managed Care, Other (non HMO) | Source: Ambulatory Visit | Attending: Cardiology | Admitting: Cardiology

## 2019-05-14 VITALS — BP 130/78 | HR 74 | Wt 207.6 lb

## 2019-05-14 DIAGNOSIS — Z87891 Personal history of nicotine dependence: Secondary | ICD-10-CM | POA: Diagnosis not present

## 2019-05-14 DIAGNOSIS — J9612 Chronic respiratory failure with hypercapnia: Secondary | ICD-10-CM | POA: Insufficient documentation

## 2019-05-14 DIAGNOSIS — Z8249 Family history of ischemic heart disease and other diseases of the circulatory system: Secondary | ICD-10-CM | POA: Diagnosis not present

## 2019-05-14 DIAGNOSIS — I509 Heart failure, unspecified: Secondary | ICD-10-CM

## 2019-05-14 DIAGNOSIS — Q676 Pectus excavatum: Secondary | ICD-10-CM | POA: Insufficient documentation

## 2019-05-14 DIAGNOSIS — I110 Hypertensive heart disease with heart failure: Secondary | ICD-10-CM | POA: Diagnosis not present

## 2019-05-14 DIAGNOSIS — I5032 Chronic diastolic (congestive) heart failure: Secondary | ICD-10-CM

## 2019-05-14 DIAGNOSIS — Z79899 Other long term (current) drug therapy: Secondary | ICD-10-CM | POA: Diagnosis not present

## 2019-05-14 DIAGNOSIS — K7460 Unspecified cirrhosis of liver: Secondary | ICD-10-CM | POA: Insufficient documentation

## 2019-05-14 DIAGNOSIS — I272 Pulmonary hypertension, unspecified: Secondary | ICD-10-CM | POA: Diagnosis not present

## 2019-05-14 DIAGNOSIS — J45909 Unspecified asthma, uncomplicated: Secondary | ICD-10-CM | POA: Insufficient documentation

## 2019-05-14 LAB — BLOOD GAS, ARTERIAL
Acid-Base Excess: 6.4 mmol/L — ABNORMAL HIGH (ref 0.0–2.0)
Bicarbonate: 31.8 mmol/L — ABNORMAL HIGH (ref 20.0–28.0)
Drawn by: 257081
O2 Content: 2 L/min
O2 Saturation: 96 %
Patient temperature: 98.6
pCO2 arterial: 59.1 mmHg — ABNORMAL HIGH (ref 32.0–48.0)
pH, Arterial: 7.351 (ref 7.350–7.450)
pO2, Arterial: 86.8 mmHg (ref 83.0–108.0)

## 2019-05-14 MED ORDER — FUROSEMIDE 40 MG PO TABS: 40.0000 mg | ORAL_TABLET | Freq: Two times a day (BID) | ORAL | 11 refills | Status: DC

## 2019-05-14 MED ORDER — ISOSORBIDE MONONITRATE ER 30 MG PO TB24: 30.0000 mg | ORAL_TABLET | Freq: Every day | ORAL | 11 refills | Status: DC

## 2019-05-14 MED ORDER — BISOPROLOL FUMARATE 5 MG PO TABS: 5.0000 mg | ORAL_TABLET | Freq: Every day | ORAL | 3 refills | Status: DC

## 2019-05-14 MED ORDER — POTASSIUM CHLORIDE CRYS ER 20 MEQ PO TBCR: EXTENDED_RELEASE_TABLET | ORAL | 11 refills | Status: DC

## 2019-05-14 MED ORDER — SPIRONOLACTONE 25 MG PO TABS: 12.5000 mg | ORAL_TABLET | Freq: Every day | ORAL | 3 refills | Status: DC

## 2019-05-14 NOTE — Patient Instructions (Signed)
ALL MEDICATIONS HAVE BEEN CALLED INTO PHARMACY  Your physician recommends that you schedule a follow-up appointment in: [place].    At the [facility], you and your health needs are our priority. As part of our continuing mission to provide you with exceptional heart care, we have created designated Provider Care Teams. These Care Teams include your primary Cardiologist (physician) and Advanced Practice Providers (APPs- Physician Assistants and Nurse Practitioners) who all work together to provide you with the care you need, when you need it.   You may see any of the following providers on your designated Care Team at your next follow up: Marland Kitchen Dr Glori Bickers . Dr Loralie Champagne . Darrick Grinder, NP   Please be sure to bring in all your medications bottles to every appointment.

## 2019-05-14 NOTE — Telephone Encounter (Signed)
Left message on voicemail with name and callback number Kohner Orlick RN Navigator Cardiac Imaging [facility] [phone] Office [phone] Cell  

## 2019-05-16 NOTE — Progress Notes (Signed)
Patient ID: [name], male   DOB: 1981/03/13, 38 y.o.   MRN: [id number] PCP: Dr. Tollie Pizza Cardiology: Dr. Aundra Dubin  38 y.o. with history of pectus excavatum s/p repair in childhood and redo pectus repair in 6/11 returns to cardiology clinic for followup of RV failure. He has chronic hypercarbic and and nocturnal hypoxemic respiratory failure.  He has been wearing Bipap at night.  Oxygen saturation drops with heavy exercise, so he uses oxygen when he will be exercising.  Echo 5/16 showed normal LV EF but did not show the RV well.  I had him get a cardiac MRI in 6/16 for better view of the RV.  This showed moderate to severe RV dilation with mild to moderate RV systolic dysfunction, RV EF 37%.  This looked very similar to the 2010 MRI.   In 8/20, he was admitted to the hospital in [place] with acute hypercarbic respiratory failure.  He was intubated, eventually able to extubate. He was extensively diuresed.  Cardiac MRI showed markedly dilated RV with moderately decreased systolic function, D-shaped septum, and left-ward shift of the heart.  The left atrium was compressed between the ascending and descending aorta.  RHC showed elevated right and left heart filling pressures.  Echo bubble study was negative. Aspiration PNA was also a concern at this admission.   He is now back at home.  He is using a Trilogy Bipap with full facemask.  He is wearing oxygen at all times.  Feeling much better, weight is down 10 lbs.  He is short of breath if he tries to walk up stairs without oxygen but does fine as long as he wears oxygen.  No chest pain, no lightheadedness.   Labs (8/10): BNP 9, K 3.6, creatinine 0.7  Labs (2/14): K 3.8, creatinine 0.8, BNP not elevated Labs (2/16): K 3.4, creatinine 0.81 Labs (6/16): K 4, creatinine 0.73, BNP 12 Labs (11/18): K 4.1, creatinine 0.82, pro-BNP 6, hgb 15.2 Labs (7/20): pro-BNP 62, K 5, creatinine 0.85  Labs (8/20): K 4.6, creatinine 0.69 Labs (9/20): ABG 7.35/59/87    Allergies (verified):  No Known Drug Allergies   Past Medical History:  1. Pectus excavatum status post repair in [place] in childhood and repeat repair at [facility] in 6/11. Borderline Marfanoid habitus.  2. Asthma.  3. CHF: Patient was admitted in 6/10 with CHF and volume overload. He was diuresed with IV lasix and felt better. Echo suggested that the RV was enlarged. RHC was done (after several doses of IV lasix) showing mean RA 13, RV 38/12, PA 42/23 mean 34, mean PCWP 15. PVR < 2 WU. Shunt run showed no O2 step up. Patient was hypoxic and was started O2. Cardiac MRI (6/10) showed normal LV size and systolic function EF [id number], D-shaped IV septum, moderate to severe RV dilation with RV EF 37%. The pulmonic valve opened normally. The heart was shifted leftward (pectus) with compression of the LA between the ascending and descending aorta. MRA of the chest showed normal pulmonary veins. CHF was primarily right-sided with mild pulmonary HTN noted. He is hypoxic, especially at night, so hypoxic pulmonary vasoconstriction could be playing a role. PCWP was only mildly elevated but I wonder if LA compression could be playing a role. Repeat echo after pectus surgery in 11/11 showed normal LV systolic function and size with a D-shaped interventricular septum. The LA was still compressed between ascending and descending aorta. The RV was moderately dilated with mild systolic dysfunction.  Echo (5/16) with EF 5-60%,  aortic root 4.0 cm, RV poorly vsiualized.  Cardiac MRI (6/16) with pectus excavatum, left-shifted heart, LA compressed between ascending and descending aorta, LV EF 58% with D-shaped interventricular septum suggestive of RV pressure/volume overload, moderate to severe RV dilation with mild to moderate RV systolic dysfunction (RV EF 37%), dilated PA (this MRI is very similar to prior in 2010).  - RHC (8/20): mean RA 11, PA 51/30 mean 41, mean PCWP 18, PAPI 1.9, CVP/PCWP 0.61 - Limited echo for bubble study  (8/20): Negative bubble study.  - Cardiac MRI (8/20): The left lung appears collapsed, compressed by leftwards shift of the heart and dilated RV.  Dilated main pulmonary artery. Normal LV size with mild diffuse hypokinesis, EF 52%. Markedly dilated RV with EF 31%, moderate hypokinesis. D-shaped interventricular septum, suggestive of RV pressure/volume overload. Small left atrium appears compressed between the aortic root/ascending aorta and the descending aorta/ventrebral column. Nonspecific LGE pattern at the RV insertion sites, suggestive of RV pressure/volume overload. 4. 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  - High resolution CT chest (8/19): Bilateral pleuroparenchymal scarring, cirrhosis noted. No ILD.  - CT chest (8/20): Severe pectus deformity, heart shifted left with extensive left lung compressive atelectasis.  5. 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 Patient is on Bipap.  6. Cirrhosis: Noted on RUQ [place] in 8/20.  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   Family History:  HTN-Mother   Social History:  The patient lives in [place] with his wife and son.  He works full time in Manufacturing engineer He drinks 1-6 drinks per week, never having more than 3 drinks in 1 evening. No illicit drug use.  Patient states former smoker. Started at age 6. only smoked socially. Quit smoking at age 74.   Review of Systems  All systems reviewed and negative except as per HPI.  Current Outpatient Medications  Medication Sig Dispense Refill  . acetaminophen (TYLENOL) 500 MG tablet Take 1,000 mg by mouth every 6 (six) hours as needed for mild pain.    Marland Kitchen albuterol (PROAIR HFA) 108 (90 BASE) MCG/ACT inhaler Inhale 2 puffs into the lungs every 6 (six) hours as needed for wheezing or shortness of breath. 8.5 each 0  . albuterol (PROVENTIL) (2.5 MG/3ML) 0.083% nebulizer solution Take 3 mLs (2.5 mg total) by nebulization every 6 (six) hours as needed  for wheezing or shortness of breath. 75 mL 11  . amphetamine-dextroamphetamine (ADDERALL) 10 MG tablet Take 10 mg by mouth 4 (four) times daily as needed.    . bisoprolol (ZEBETA) 5 MG tablet Take 1 tablet (5 mg total) by mouth daily. 90 tablet 3  . budesonide-formoterol (SYMBICORT) 80-4.5 MCG/ACT inhaler Inhale 2 puffs into the lungs 2 (two) times a day. 2 Inhaler 0  . cetirizine (ZYRTEC) 10 MG tablet Take 1 tablet (10 mg total) by mouth daily. 30 tablet 2  . furosemide (LASIX) 40 MG tablet Take 1 tablet (40 mg total) by mouth 2 (two) times daily. 60 tablet 11  . isosorbide mononitrate (IMDUR) 30 MG 24 hr tablet Take 1 tablet (30 mg total) by mouth daily. Need appt for future refills [id number] 30 tablet 11  . Melatonin 10 MG TABS Take 10 mg by mouth at bedtime.    Marland Kitchen nystatin (MYCOSTATIN) 100000 UNIT/ML suspension Take 5 mLs (500,000 Units total) by mouth 4 (four) times daily. As needed for thrush symptoms 60 mL 0  . Omeprazole (PRILOSEC PO) Take 1 capsule by mouth 2 (two)  times daily.     . ondansetron (ZOFRAN) 4 MG tablet Take 1 tablet (4 mg total) by mouth every 6 (six) hours as needed for nausea. 20 tablet 0  . polyethylene glycol (MIRALAX / GLYCOLAX) 17 g packet Take 17 g by mouth daily as needed for mild constipation. 14 each 0  . potassium chloride SA (KLOR-CON M20) 20 MEQ tablet TAKE 2 TABLETS BY MOUTH DAILY. 60 tablet 11  . senna-docusate (SENOKOT-S) 8.6-50 MG tablet Take 2 tablets by mouth at bedtime as needed for mild constipation or moderate constipation. 300 tablet 0  . simethicone (MYLICON) 80 MG chewable tablet Chew 80 mg by mouth as needed for flatulence.     Marland Kitchen spironolactone (ALDACTONE) 25 MG tablet Take 0.5 tablets (12.5 mg total) by mouth daily. 45 tablet 3   No current facility-administered medications for this encounter.     BP 130/78   Pulse 74   Wt 94.2 kg (207 lb 9.6 oz)   SpO2 94%   BMI 25.27 kg/m  General: NAD MSK: Pectus deformity Neck: No JVD, no thyromegaly  or thyroid nodule.  Lungs: Decreased BS left base. CV: Nondisplaced PMI.  Heart regular S1/S2, no S3/S4, no murmur.  No peripheral edema.  No carotid bruit.  Normal pedal pulses.  Abdomen: Soft, nontender, no hepatosplenomegaly, no distention.  Skin: Intact without lesions or rashes.  Neurologic: Alert and oriented x 3.  Psych: Normal affect. Extremities: No clubbing or cyanosis.  HEENT: Normal.   Assessment/Plan: 1. Chronic hypercarbic respiratory failure: Now stabilized, but recent admission with acute hypercarbic respiratory failure. CTA chest showed collapsed left lung with compression from dilated RV, no PE, RLL infiltrate (aspiration versus PNA). Of note, he would eat and drink while using his Bipap so this may have led to aspiration. ABG today is improved.  This appears to be primarily a mechanical problem with severe pectus and leftward shift of the heart with compression of the left lung.  Would consider a second surgical opinion.  Otherwise, he may end up with a tracheostomy.  - Dr. [name] is coordinating evaluation by a pulmonologist/surgeon experienced in management of severe pectus.  We discussed today, he is sending films to [facility].  - Refer to pulmonary rehab in [place].  2.Chronic diastolic CHF with RV failure: Patient presented with primarily right-sided CHF in 2010, likely due to pulmonary hypertension from a combination of left atrial compression and nocturnal hypoxemia. He is now using Bipap every night. He had a redo pectus surgery in 2011. I suspect the surgery relieved some of the compression on his lungs but followup cardiac MRI in 6/16 did show continued compression of the left atrium between the ascending and descending aorta as well as a dilated RV with RV EF 37%. Cardiac MRI in 6/16 was very similar to the MRI in 2010. RHC in 8/20 showed elevated right and left heart filling pressures with primarily pulmonary venous hypertension.  PAPI low but  not markedly low, suggesting mild-moderate RV dysfunction. The MRI repeated in 8/20 showed left-shifted heart with marked RV enlargement and RVEF 31%, LV EF 52%.  The LA is compressed between ascending and descending aorta, which may explain elevated PCWP.  He is down 10 lbs since last office visit and looks euvolemic.  - Continue Lasix 40 mg po bid, recent BMET was ok.  -Continue Bipap at night and oxygen during the day to lower drive for hypoxemic pulmonary vasoconstriction. 2. Pulmonary hypertension: Patient likely has had a combination of secondary  pulmonary hypertension from LA compression (Group 2, pulmonary venous hypertension) as well as hypoxemic pulmonary vasoconstriction fromchronic hypoxemic/hypercarbic respiratory failure(Group 3). RHC in 8/20 moderate pulmonary hypertension, PA pressure 51/30. Significant component of pulmonary venous hypertension.  There is unlikely to be a group 1 component to his PH, so not sure pulmonary vasodilators would be helpful.  3. 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 He has had chronic hypercarbic respiratory failure. This is a major player in his chronic dyspnea. He has had 2 operations already for his pectus deformity. CT in 8/20 showed heart shifted left with compression of left lung.  - See above, will need close pulmonary followup, Dr. [name] looking into a center where he can get another surgical opinion.   4. Asthma:Chronic. 5. Cirrhosis: Abdominal US showed cirrhosis.  He is not a heavy drinker.  My suspicion is that this may be related to RV failure.  - Needs eventual MRI of liver given hyperechoic right liver lesion.   Followup in 1 month.    Loralie Champagne 05/16/2019

## 2019-05-18 ENCOUNTER — Telehealth: Payer: Self-pay | Admitting: Primary Care

## 2019-05-18 ENCOUNTER — Ambulatory Visit (HOSPITAL_COMMUNITY): Admission: RE | Admit: 2019-05-18 | Payer: Managed Care, Other (non HMO) | Source: Ambulatory Visit

## 2019-05-18 ENCOUNTER — Encounter (HOSPITAL_COMMUNITY): Payer: Self-pay

## 2019-05-18 NOTE — Telephone Encounter (Signed)
----- Message from Joella Prince, RN sent at 05/18/2019  4:47 PM EDT ----- Regarding: RE: Need fu on ABGs Appt request made for 4 weeks ----- Message ----- From: Martyn Ehrich, NP Sent: 05/18/2019   9:08 AM EDT To: Juanito Doom, MD, Lauraine Rinne, NP, # Subject: RE: Need fu on ABGs                             Langley Gauss,  Can you look to see if any of our sleep doctors have seen this man. If not can we get a follow-up in 1 months with Dr. Wendi Snipes   Thanks, Beth  ----- Message ----- From: Lauraine Rinne, NP Sent: 05/17/2019   8:34 PM EDT To: Juanito Doom, MD, Martyn Ehrich, NP Subject: RE: Need fu on ABGs                            ABG has been completed.   Improved PCO2 and Bicarb from 8/18  9/4 - ABG:  PH  7.351  pCO2  59.1   pO2 86.8  Bicarbonate 31.8   For reference 8/18 ABG was:  8/18 ABG  pH, Arterial 7.316 pCO2 arterial 88.1 pO2, Arterial 95.3  Bicarbonate 43.7    Beth will be back in office on 05/18/2019 so I will let you guys take the lead on this for follow up and new recs if you have any.   B  ----- Message ----- From: Juanito Doom, MD Sent: 05/12/2019   7:40 AM EDT To: Melvenia Needles, NP, Lauraine Rinne, NP, # Subject: RE: Need fu on ABGs                            When is the next ABG? ----- Message ----- From: Lauraine Rinne, NP Sent: 05/11/2019   5:40 PM EDT To: Melvenia Needles, NP, Juanito Doom, MD, # Subject: RE: Need fu on ABGs                            Sounds good.   MR is managing his trilogy? Or is there a sleep doc also seeing him?   Will keep an eye out   B ----- Message ----- From: Martyn Ehrich, NP Sent: 05/11/2019   5:15 PM EDT To: Melvenia Needles, NP, Juanito Doom, MD, # Subject: Need fu on ABGs                                I saw a Dr. Lake Bells patient today for hospital follow-up.  He was admitted to [facility] from 8/11-8/18 for respiratory failure related to his restrictive lung disease/pectus  excavatum, heart failure and pneumonia. PCO2 was >100, on discharge is was 88. Treated with azithro/ceftriaxone as well as diuresis per cardiology.   He looked really good in the office today. Lungs clear. Wearing his oxygen. O2 98% 2L pulsed. He reports compliance with trilogy which is new at night and 24/7 oxygen. He had CMET with PCP on 8/26 that showed CO2 of 41. I am ordering repeat ABGs.   Dr. Lake Bells is his doctor but I am changing him over to MR who saw him in patient this most recent hospital  admission. He will be seeing HF clinic this Friday 9/4 who is managing his diuretics.   Can someone just follow up on his ABGs for me. Any questions reach out to BQ or MR.   Thanks a ton, [employer]

## 2019-05-18 NOTE — Telephone Encounter (Deleted)
-----   Message from Juanito Doom, MD sent at 05/18/2019  4:39 PM EDT ----- Regarding: RE: Need fu on ABGs That ABG looks great for him, assuming he is feeling well I don't recommend any changes. ----- Message ----- From: Lauraine Rinne, NP Sent: 05/17/2019   8:34 PM EDT To: Juanito Doom, MD, Martyn Ehrich, NP Subject: RE: Need fu on ABGs                            ABG has been completed.   Improved PCO2 and Bicarb from 8/18  9/4 - ABG:  PH  7.351  pCO2  59.1   pO2 86.8  Bicarbonate 31.8   For reference 8/18 ABG was:  8/18 ABG  pH, Arterial 7.316 pCO2 arterial 88.1 pO2, Arterial 95.3  Bicarbonate 43.7    Beth will be back in office on 05/18/2019 so I will let you guys take the lead on this for follow up and new recs if you have any.   B  ----- Message ----- From: Juanito Doom, MD Sent: 05/12/2019   7:40 AM EDT To: Melvenia Needles, NP, Lauraine Rinne, NP, # Subject: RE: Need fu on ABGs                            When is the next ABG? ----- Message ----- From: Lauraine Rinne, NP Sent: 05/11/2019   5:40 PM EDT To: Melvenia Needles, NP, Juanito Doom, MD, # Subject: RE: Need fu on ABGs                            Sounds good.   MR is managing his trilogy? Or is there a sleep doc also seeing him?   Will keep an eye out   B ----- Message ----- From: Martyn Ehrich, NP Sent: 05/11/2019   5:15 PM EDT To: Melvenia Needles, NP, Juanito Doom, MD, # Subject: Need fu on ABGs                                I saw a Dr. Lake Bells patient today for hospital follow-up.  He was admitted to [facility] from 8/11-8/18 for respiratory failure related to his restrictive lung disease/pectus excavatum, heart failure and pneumonia. PCO2 was >100, on discharge is was 88. Treated with azithro/ceftriaxone as well as diuresis per cardiology.   He looked really good in the office today. Lungs clear. Wearing his oxygen. O2 98% 2L pulsed. He reports compliance with trilogy which is new at  night and 24/7 oxygen. He had CMET with PCP on 8/26 that showed CO2 of 41. I am ordering repeat ABGs.   Dr. Lake Bells is his doctor but I am changing him over to MR who saw him in patient this most recent hospital admission. He will be seeing HF clinic this Friday 9/4 who is managing his diuretics.   Can someone just follow up on his ABGs for me. Any questions reach out to BQ or MR.   Thanks a ton, [employer]

## 2019-05-18 NOTE — Telephone Encounter (Signed)
Error

## 2019-05-24 NOTE — Telephone Encounter (Signed)
Called spoke with patient. He hasn't heard anything from [name] out of [place], or Dr. Sabra Heck out of [place] wanted to follow up with Dr. [name] . Also follow up per BW.  Nothing further needed

## 2019-05-24 NOTE — Telephone Encounter (Signed)
ATC patient unable to reach, LM to call back.  Regarding receipt: will route to Daneil Dan to follow up on for when she returns to office on 05/25/19

## 2019-05-24 NOTE — Telephone Encounter (Signed)
Pt called back - he can be reached at [phone]

## 2019-05-24 NOTE — Telephone Encounter (Signed)
Have we received a receipt from [facility] that had CD disc with radiology image?  Where are we at with a referral for patient to see [facility] or Dr. Sabra Heck in [place]? Does he have a follow-up scheduled with Ramaswamy scheduled   Cc:MR

## 2019-05-25 ENCOUNTER — Other Ambulatory Visit: Payer: Self-pay

## 2019-05-25 ENCOUNTER — Ambulatory Visit (INDEPENDENT_AMBULATORY_CARE_PROVIDER_SITE_OTHER): Payer: Managed Care, Other (non HMO) | Admitting: Internal Medicine

## 2019-05-25 ENCOUNTER — Encounter: Payer: Self-pay | Admitting: Internal Medicine

## 2019-05-25 VITALS — BP 118/70 | HR 58 | Temp 96.5°F | Ht 76.0 in | Wt 210.2 lb

## 2019-05-25 DIAGNOSIS — Q676 Pectus excavatum: Secondary | ICD-10-CM | POA: Diagnosis not present

## 2019-05-25 DIAGNOSIS — J9612 Chronic respiratory failure with hypercapnia: Secondary | ICD-10-CM | POA: Diagnosis not present

## 2019-05-25 DIAGNOSIS — J9611 Chronic respiratory failure with hypoxia: Secondary | ICD-10-CM

## 2019-05-25 NOTE — Progress Notes (Signed)
OV 05/25/2019  Subjective:  Patient ID: [name], male , DOB: [birth year] , age 38 y.o. , MRN: [id number] , ADDRESS: [place] [address]   05/25/2019 -   Chief Complaint  Patient presents with  . Restrictive Lung Disease    Feels a little better since last visit.     ICD-10-CM   1. Chronic respiratory failure with hypoxia and hypercapnia (HCC)  J96.11    J96.12   2. Pectus excavatum  Q67.6      HPI [name] 38 y.o. -presents for follow-up of the above issues.  He was hospitalized approximately just over a month ago with significant acute on chronic hypercapnic and hypoxemic respiratory failure.  PCO2 was greater than 100 at that time.  He ultimately got discharged.  In between he saw my nurse practitioner and his PCO2 had come down to 59.  He is on trilogy ventilator at night along with 3 L of oxygen at night.  He is also on 1-2 L of oxygen in the daytime.  He has quit working.  He might be applying for disability him not so sure.  Is up-to-date with his vaccination status.  We have been looking at connecting him with a pectus excavatum redo surgeon.  A month ago I spoke to the surgeon Dr. Hinton Lovely at [facility].  She does not do anymore redo surgeries.  She referred [place] to Dr. Linna Hoff in [place].  Nurse practitioner has made a referral but neither as an outpatient have heard from him.  We also sent his CD-ROM of the CT scan to Dr. Threasa Alpha at [facility] in [place].  Waiting to hear.  Patient is interested in a referral to the [place] clinic because his family in the [place] area.  I have contacted Dr. West Carbo at the [facility] clinic to inquire about potential surgeon    SYMPTOM SCALE - 05/25/2019   O2 use   Shortness of Breath 0 -> 5 scale with 5 being worst (score 6 If unable to do)  At rest 0  Simple tasks - showers, clothes change, eating, shaving 0  Household (dishes, doing bed, laundry) 3  Shopping 1  Walking level at own  pace 2  Walking keeping up with others of same age 62  Walking up Stairs 4  Walking up Hill 4  Total (40 - 48) Dyspnea Score 17  How bad is your cough? 0  How bad is your fatigue 0        Results for DARREON, ANSPACH (MRN [id number]) as of 05/25/2019 09:48  Ref. Range 04/25/2019 07:19 04/26/2019 05:26 04/26/2019 08:05 04/27/2019 08:35 05/14/2019 14:54  pH, Arterial Latest Ref Range: 7.350 - 7.450  7.193 (LL) 7.181 (LL) 7.231 (L) 7.316 (L) 7.351  pCO2 arterial Latest Ref Range: 32.0 - 48.0 mmHg 110 (HH) 119 (HH) 103 (HH) 88.1 (HH) 59.1 (H)  pO2, Arterial Latest Ref Range: 83.0 - 108.0 mmHg 74.2 (L) 85.7 91.4 95.3 86.8   ROS - per HPI     has a past medical history of Asthma, CHF (congestive heart failure) (HCC), Chronic hypercapnic respiratory failure (HCC), Pectus excavatum, and Restrictive lung disease.   reports that he quit smoking about 12 years ago. His smoking use included cigarettes. He has a 0.10 pack-year smoking history. He has never used smokeless tobacco.  Past Surgical History:  Procedure Laterality Date  . KNEE SURGERY     right  . PECTUS  EXCAVATUM REPAIR    . RIGHT HEART CATH N/A 04/21/2019   Procedure: RIGHT HEART CATH;  Surgeon: Larey Dresser, MD;  Location: [facility];  Service: Cardiovascular;  Laterality: N/A;    Allergies  Allergen Reactions  . Other Itching    Pet Dander    Immunization History  Administered Date(s) Administered  . Influenza Split 06/16/2012  . Influenza Whole 06/09/2010, 06/10/2011  . Influenza,inj,Quad PF,6+ Mos 06/08/2013, 08/10/2015, 05/28/2018, 05/11/2019  . Influenza-Unspecified 06/09/2014    Family History  Problem Relation Age of Onset  . Hypertension Mother   . Heart attack Neg Hx   . Stroke Neg Hx      Current Outpatient Medications:  .  acetaminophen (TYLENOL) 500 MG tablet, Take 1,000 mg by mouth every 6 (six) hours as needed for mild pain., Disp: , Rfl:  .  albuterol (PROAIR HFA) 108 (90 BASE) MCG/ACT  inhaler, Inhale 2 puffs into the lungs every 6 (six) hours as needed for wheezing or shortness of breath., Disp: 8.5 each, Rfl: 0 .  albuterol (PROVENTIL) (2.5 MG/3ML) 0.083% nebulizer solution, Take 3 mLs (2.5 mg total) by nebulization every 6 (six) hours as needed for wheezing or shortness of breath., Disp: 75 mL, Rfl: 11 .  amphetamine-dextroamphetamine (ADDERALL) 10 MG tablet, Take 10 mg by mouth 4 (four) times daily as needed., Disp: , Rfl:  .  bisoprolol (ZEBETA) 5 MG tablet, Take 1 tablet (5 mg total) by mouth daily., Disp: 90 tablet, Rfl: 3 .  budesonide-formoterol (SYMBICORT) 80-4.5 MCG/ACT inhaler, Inhale 2 puffs into the lungs 2 (two) times a day., Disp: 2 Inhaler, Rfl: 0 .  cetirizine (ZYRTEC) 10 MG tablet, Take 1 tablet (10 mg total) by mouth daily., Disp: 30 tablet, Rfl: 2 .  furosemide (LASIX) 40 MG tablet, Take 1 tablet (40 mg total) by mouth 2 (two) times daily., Disp: 60 tablet, Rfl: 11 .  isosorbide mononitrate (IMDUR) 30 MG 24 hr tablet, Take 1 tablet (30 mg total) by mouth daily. Need appt for future refills [phone], Disp: 30 tablet, Rfl: 11 .  Melatonin 10 MG TABS, Take 10 mg by mouth at bedtime., Disp: , Rfl:  .  nystatin (MYCOSTATIN) 100000 UNIT/ML suspension, Take 5 mLs (500,000 Units total) by mouth 4 (four) times daily. As needed for thrush symptoms, Disp: 60 mL, Rfl: 0 .  Omeprazole (PRILOSEC PO), Take 1 capsule by mouth 2 (two) times daily. , Disp: , Rfl:  .  ondansetron (ZOFRAN) 4 MG tablet, Take 1 tablet (4 mg total) by mouth every 6 (six) hours as needed for nausea., Disp: 20 tablet, Rfl: 0 .  polyethylene glycol (MIRALAX / GLYCOLAX) 17 g packet, Take 17 g by mouth daily as needed for mild constipation., Disp: 14 each, Rfl: 0 .  potassium chloride SA (KLOR-CON M20) 20 MEQ tablet, TAKE 2 TABLETS BY MOUTH DAILY., Disp: 60 tablet, Rfl: 11 .  senna-docusate (SENOKOT-S) 8.6-50 MG tablet, Take 2 tablets by mouth at bedtime as needed for mild constipation or moderate  constipation., Disp: 300 tablet, Rfl: 0 .  simethicone (MYLICON) 80 MG chewable tablet, Chew 80 mg by mouth as needed for flatulence. , Disp: , Rfl:  .  spironolactone (ALDACTONE) 25 MG tablet, Take 0.5 tablets (12.5 mg total) by mouth daily., Disp: 45 tablet, Rfl: 3 .  Potassium Chloride ER 20 MEQ TBCR, Take 2 tablets by mouth daily., Disp: , Rfl:       Objective:   Vitals:   05/25/19 0942  BP: 118/70  Pulse: (!) 58  Temp: (!) 96.5 F (35.8 C)  SpO2: 96%  Weight: 210 lb 3.2 oz (95.3 kg)  Height: 6\' 4"  (1.93 m)    Estimated body mass index is 25.59 kg/m as calculated from the following:   Height as of this encounter: 6\' 4"  (1.93 m).   Weight as of this encounter: 210 lb 3.2 oz (95.3 kg).  @WEIGHTCHANGE @  [employer]   05/25/19 0942  Weight: 210 lb 3.2 oz (95.3 kg)     Physical Exam  General Appearance:    Alert, cooperative, no distress, appears stated age - yes , Deconditioned looking - no , OBESE  - no, Sitting on Wheelchair -  no  Head:    Normocephalic, without obvious abnormality, atraumatic  Eyes:    PERRL, conjunctiva/corneas clear,  Ears:    Normal TM's and external ear canals, both ears  Nose:   Nares normal, septum midline, mucosa normal, no drainage    or sinus tenderness. OXYGEN ON  - yes . Patient is @ 2L   Throat:   MASK  Neck:   Supple, symmetrical, trachea midline, no adenopathy;    thyroid:  no enlargement/tenderness/nodules; no carotid   bruit or JVD  Back:     Symmetric, no curvature, ROM normal, no CVA tenderness  Lungs:     Distress - no , Wheeze no but faint end insp stridor +, Barrell Chest - no, Purse lip breathing - no, Crackles - no   Chest Wall:   Pectus excavatum with surgical scar   Heart:    Regular rate and rhythm, S1 and S2 normal, no rub   or gallop, Murmur - no  Breast Exam:    NOT DONE  Abdomen:     Soft, non-tender, bowel sounds active all four quadrants,    no masses, no organomegaly. Visceral obesity - YES  Genitalia:   NOT  DONE  Rectal:   NOT DONE  Extremities:   Extremities - normal, Has Cane - no, Clubbing - no, Edema - no  Pulses:   2+ and symmetric all extremities  Skin:   Stigmata of Connective Tissue Disease - no  Lymph nodes:   Cervical, supraclavicular, and axillary nodes normal  Psychiatric:  Neurologic:   Pleasant - yes, Anxious - no, Flat affect - no  CAm-ICU - neg, Alert and Oriented x 3 - yes, Moves all 4s - yes, Speech - normal, Cognition - intact           Assessment:       ICD-10-CM   1. Chronic respiratory failure with hypoxia and hypercapnia (HCC)  J96.11    J96.12   2. Pectus excavatum  Q67.6        Plan:     Patient Instructions     ICD-10-CM   1. Chronic respiratory failure with hypoxia and hypercapnia (HCC)  J96.11    J96.12   2. Pectus excavatum  Q67.6     Much improved  Plan -Continue 1-2 L of oxygen in the daytime and approximately 3 L of oxygen at night -Continue trilogy ventilator at night -Regarding pectus excavatum redo surgery  -Awaiting to hear from my  contact Dr. Elisabeth Cara at [place] someone at [facility] clinic to see if they have a surgeon  -Followed up with a surgeon at [facility] to see how to get hold of Dr. Linna Hoff in [place]  -If you need to apply for disability you could  Follow-up  -2-3 months or  sooner if needed   (> 50% of this 15 min visit spent in face to face counseling or/and coordination of care by this undersigned MD - Dr Brand Males. This includes one or more of the following documented above: discussion of test results, diagnostic or treatment recommendations, prognosis, risks and benefits of management options, instructions, education, compliance or risk-factor reduction)   SIGNATURE    Dr. Brand Males, M.D., F.C.C.P,  Pulmonary and Critical Care Medicine Staff Physician, [place] Director - Interstitial Lung Disease  Program  Pulmonary [place] at  [place], [place], [address]  Pager: [phone], If no answer or between  15:00h - 7:00h: call [phone] Telephone: [phone]  10:08 AM 05/25/2019 a

## 2019-05-25 NOTE — Patient Instructions (Signed)
ICD-10-CM   1. Chronic respiratory failure with hypoxia and hypercapnia (HCC)  J96.11    J96.12   2. Pectus excavatum  Q67.6     Much improved  Plan -Continue 1-2 L of oxygen in the daytime and approximately 3 L of oxygen at night -Continue trilogy ventilator at night -Regarding pectus excavatum redo surgery  -Awaiting to hear from my  contact Dr. Elisabeth Cara at [place] someone at [facility] clinic to see if they have a surgeon  -Followed up with a surgeon at [facility] to see how to get hold of Dr. Linna Hoff in [place]  -If you need to apply for disability you could  Follow-up  -2-3 months or sooner if needed

## 2019-05-25 NOTE — Telephone Encounter (Signed)
An email was sent on 9/14 to [facility] inquiring if disc was received. I have not received a receipt from [employer]. Will update chart once as appropriate.

## 2019-06-01 NOTE — Telephone Encounter (Signed)
Disc was received by the provider at [facility]. Will sign off.

## 2019-06-07 NOTE — Telephone Encounter (Signed)
Finally got an email from Dr. Linna Hoff n [facility] said that he is working on getting hold of the patient.  Did [name] get a call from [facility]?    The [employer] people did receive his CT scan of the chest but have problems uploading it.  This was as of late last week.  They are trying to work around with their technology people.  I should anticipate hearing from [name] in another 7-10 days.  If they cannot upload the CT scan then we will have to send him another CT scan   Appreciate his patients.  Please explain to him that these things tend to be slow.  BEcause Dr Ammie Ferrier office got the referral patient can independently pursue by contacting [email or web address]

## 2019-06-07 NOTE — Telephone Encounter (Signed)
Dr. [name],  This message was received this morning.   Good morning,  It's been a few weeks and I haven't heard anything from your office on the surgery options we talked about.   Is there anything else I need to do?  Thank you!   Message routed to Dr. [name]

## 2019-06-07 NOTE — Telephone Encounter (Signed)
Message received from Patient.  I haven't received anything from [place].  I tried to call down there today and was not able to get to him because the number on the site just went to [facility] main line. The person I talked to couldn't find the referral anywhere. I was put through to the vm of Lovena Le at the hospital concierge services.   Please let me know what if anything else I can do.   Thank you!!  Message routed to Dr. [name]

## 2019-06-08 NOTE — Telephone Encounter (Signed)
I emailed Dr Sabra Heck again 06/08/2019 1:19 PM - will see what he replies. Appreciate your patience and persistence. IF this is dead end I will try reaching out to [facility]

## 2019-06-09 NOTE — Telephone Encounter (Signed)
I called [name], she states she took patient's information and wants records to be faxed to their office to place in front of Dr. Sabra Heck so he can see it all and decide if he can take on the patient.   Dr. [name] is there anything specific you want to be faxed to Dr. Ammie Ferrier office? [name] asked for OV, reports for scans, recent labs,   Fax is [phone]  MR please advise

## 2019-06-10 ENCOUNTER — Telehealth: Payer: Self-pay | Admitting: Internal Medicine

## 2019-06-10 NOTE — Telephone Encounter (Signed)
Please refer to 06/07/2019 email. Will need Dr. Ammie Ferrier mailing address. Called [place] (Dr. Ammie Ferrier nurse) at [phone] and left a VM to call back with their mailing address.   Last CT chest was performed at [facility] in 04/2019. I ATC the [facility] radiology dept but the line rang several times without VM or pick up. WCB to have images transferred to a disc and couriered here.   Will leave in triage until message is complete.

## 2019-06-10 NOTE — Telephone Encounter (Signed)
Triage  Please send via mail to Dr Sabra Heck.   1. CT scan reports and CD Rom - latest 2. Most recent hospital admission H&P, consult of cardiology, consult of critical care, discharge summary 3. Office visit notes of cardiology, and pulmonary post disscharge  FYI - he has not replied to me as yet  Thanks  PS: also I found [place] [place] -> [email or web address] - patient can call and find out if they do ADULT REDO. I do not know myself  SIGNATURE    Dr. Brand Males, M.D., F.C.C.P,  Pulmonary and Critical Care Medicine Staff Physician, [place] Director - Interstitial Lung Disease  Program  Pulmonary [place] at [place], [place], [address]  Pager: [phone], If no answer or between  15:00h - 7:00h: call [phone] Telephone: [phone]  1:30 PM 06/10/2019

## 2019-06-11 NOTE — Telephone Encounter (Signed)
Chequita from [place] returned Elise's call.  [phone].  Regarding address.

## 2019-06-11 NOTE — Telephone Encounter (Signed)
[place]  [address]

## 2019-06-11 NOTE — Telephone Encounter (Signed)
Angelica called regards records and CT scan - advised we are waited on CT disc - she would like to know how long it would take for [place] to get the CT, she states that she can't scheduled the patient with Dr. Sabra Heck until he reviews the CT and records- she would like a call back at [phone] - she also said that she will be on vacation next week so we can ask for Rodena Piety -pr

## 2019-06-11 NOTE — Telephone Encounter (Signed)
Called and spoke with [facility] Radiology  I requested that they mail [place] the CT Chest dated 04/21/19  Will await the CT Chest to be mailed here and then will mail all of what MR requested to the address provided  Will hold in triage

## 2019-06-11 NOTE — Telephone Encounter (Signed)
Spoke with Angelica and advised that I was just able to request the CT on a disc today and that it should be courier over next wk  Once we get this will print all records and mail  Will await ct

## 2019-06-14 ENCOUNTER — Ambulatory Visit (HOSPITAL_COMMUNITY)
Admission: RE | Admit: 2019-06-14 | Discharge: 2019-06-14 | Disposition: A | Discharge status: Home / Self Care | Payer: Managed Care, Other (non HMO) | Source: Ambulatory Visit | Attending: Cardiology | Admitting: Cardiology

## 2019-06-14 ENCOUNTER — Encounter (HOSPITAL_COMMUNITY): Payer: Self-pay | Admitting: Cardiology

## 2019-06-14 ENCOUNTER — Other Ambulatory Visit: Payer: Self-pay

## 2019-06-14 VITALS — BP 121/73 | HR 73 | Wt 218.8 lb

## 2019-06-14 DIAGNOSIS — I5032 Chronic diastolic (congestive) heart failure: Secondary | ICD-10-CM

## 2019-06-14 DIAGNOSIS — Z87891 Personal history of nicotine dependence: Secondary | ICD-10-CM | POA: Insufficient documentation

## 2019-06-14 DIAGNOSIS — Z79899 Other long term (current) drug therapy: Secondary | ICD-10-CM | POA: Insufficient documentation

## 2019-06-14 DIAGNOSIS — R55 Syncope and collapse: Secondary | ICD-10-CM | POA: Diagnosis not present

## 2019-06-14 DIAGNOSIS — Z7951 Long term (current) use of inhaled steroids: Secondary | ICD-10-CM | POA: Insufficient documentation

## 2019-06-14 DIAGNOSIS — K7460 Unspecified cirrhosis of liver: Secondary | ICD-10-CM | POA: Diagnosis not present

## 2019-06-14 DIAGNOSIS — Z8249 Family history of ischemic heart disease and other diseases of the circulatory system: Secondary | ICD-10-CM | POA: Insufficient documentation

## 2019-06-14 DIAGNOSIS — I272 Pulmonary hypertension, unspecified: Secondary | ICD-10-CM | POA: Diagnosis not present

## 2019-06-14 DIAGNOSIS — J9612 Chronic respiratory failure with hypercapnia: Secondary | ICD-10-CM | POA: Diagnosis not present

## 2019-06-14 DIAGNOSIS — I110 Hypertensive heart disease with heart failure: Secondary | ICD-10-CM | POA: Insufficient documentation

## 2019-06-14 DIAGNOSIS — J45909 Unspecified asthma, uncomplicated: Secondary | ICD-10-CM | POA: Insufficient documentation

## 2019-06-14 LAB — BASIC METABOLIC PANEL
Anion gap: 8 (ref 5–15)
BUN: 12 mg/dL (ref 6–20)
CO2: 35 mmol/L — ABNORMAL HIGH (ref 22–32)
Calcium: 9 mg/dL (ref 8.9–10.3)
Chloride: 101 mmol/L (ref 98–111)
Creatinine, Ser: 0.75 mg/dL (ref 0.61–1.24)
GFR calc Af Amer: >60 mL/min (ref >60)
GFR calc non Af Amer: >60 mL/min (ref >60)
Glucose, Bld: 110 mg/dL — ABNORMAL HIGH (ref 70–99)
Potassium: 4.3 mmol/L (ref 3.5–5.1)
Sodium: 144 mmol/L (ref 135–145)

## 2019-06-14 MED ORDER — FUROSEMIDE 40 MG PO TABS: 40.0000 mg | ORAL_TABLET | Freq: Two times a day (BID) | ORAL | 11 refills | Status: DC

## 2019-06-14 MED ORDER — SPIRONOLACTONE 25 MG PO TABS: 25.0000 mg | ORAL_TABLET | Freq: Every day | ORAL | 3 refills | Status: DC

## 2019-06-14 MED ORDER — POTASSIUM CHLORIDE CRYS ER 20 MEQ PO TBCR: 20.0000 meq | EXTENDED_RELEASE_TABLET | Freq: Every day | ORAL | 3 refills | Status: DC

## 2019-06-14 NOTE — Patient Instructions (Signed)
INCREASE Spironolactone to 25mg  (1 tab) daily  DECREASE Potassium to 20 meq (1 tab) daily  Labs today and repeat in 10 days We will only contact you if something comes back abnormal or we need to make some changes. Otherwise no news is good news!  Your physician recommends that you schedule a follow-up appointment in: 3 months with Dr Aundra Dubin.  You will receive a call to schedule your 3 month follow up.  At the [facility], you and your health needs are our priority. As part of our continuing mission to provide you with exceptional heart care, we have created designated Provider Care Teams. These Care Teams include your primary Cardiologist (physician) and Advanced Practice Providers (APPs- Physician Assistants and Nurse Practitioners) who all work together to provide you with the care you need, when you need it.   You may see any of the following providers on your designated Care Team at your next follow up: Marland Kitchen Dr Glori Bickers . Dr Loralie Champagne . Darrick Grinder, NP   Please be sure to bring in all your medications bottles to every appointment.

## 2019-06-15 ENCOUNTER — Telehealth (HOSPITAL_COMMUNITY): Payer: Self-pay

## 2019-06-15 DIAGNOSIS — K769 Liver disease, unspecified: Secondary | ICD-10-CM

## 2019-06-15 NOTE — Telephone Encounter (Signed)
-----   Message from Larey Dresser, MD sent at 06/15/2019 12:26 AM EDT ----- Could you please arrange for [name] to get a liver MRI to followup on hyperechoic right liver lesion seen on prior imaging? Thanks.

## 2019-06-15 NOTE — Telephone Encounter (Signed)
Received a request for Medical records from [facility]. Faxed to [phone], Attention Anjellica, on [phone].

## 2019-06-15 NOTE — Telephone Encounter (Signed)
I checked MR's cubby and do not see that the ct has been mailed  Will continue to keep an eye out for this

## 2019-06-15 NOTE — Progress Notes (Signed)
Patient ID: [name], male   DOB: 16-Apr-1981, 38 y.o.   MRN: [id number] PCP: Dr. Tollie Pizza Cardiology: Dr. Aundra Dubin  38 y.o. with history of pectus excavatum s/p repair in childhood and redo pectus repair in 6/11 returns to cardiology clinic for followup of RV failure. He has chronic hypercarbic and and nocturnal hypoxemic respiratory failure.  He has been wearing Bipap at night.  Oxygen saturation drops with heavy exercise, so he uses oxygen when he will be exercising.  Echo 5/16 showed normal LV EF but did not show the RV well.  I had him get a cardiac MRI in 6/16 for better view of the RV.  This showed moderate to severe RV dilation with mild to moderate RV systolic dysfunction, RV EF 37%.  This looked very similar to the 2010 MRI.   In 8/20, he was admitted to the hospital in [place] with acute hypercarbic respiratory failure.  He was intubated, eventually able to extubate. He was extensively diuresed.  Cardiac MRI showed markedly dilated RV with moderately decreased systolic function, D-shaped septum, and left-ward shift of the heart.  The left atrium was compressed between the ascending and descending aorta.  RHC showed elevated right and left heart filling pressures.  Echo bubble study was negative. Aspiration PNA was also a concern at this admission.   He is now back at home.  He is using a Trilogy Bipap with full facemask.  He is wearing oxygen at all times.  He is off Adderrall and weight is up about 10 lbs. He is breathing a little worse with the weight gain.  However, generally able to walk on flat ground without dyspnea.  No orthopnea/PND.  No chest pain.  No palpitations.  No cough.  No lightheadedness. He is waiting on appointment with pectus excavatum specialist, has a telemedicine visit with Dr. [name] in [place].    Labs (8/10): BNP 9, K 3.6, creatinine 0.7  Labs (2/14): K 3.8, creatinine 0.8, BNP not elevated Labs (2/16): K 3.4, creatinine 0.81 Labs (6/16): K 4, creatinine  0.73, BNP 12 Labs (11/18): K 4.1, creatinine 0.82, pro-BNP 6, hgb 15.2 Labs (7/20): pro-BNP 62, K 5, creatinine 0.85  Labs (8/20): K 4.6, creatinine 0.69 Labs (9/20): ABG 7.35/59/87   Allergies (verified):  No Known Drug Allergies   Past Medical History:  1. Pectus excavatum status post repair in [place] in childhood and repeat repair at [facility] in 6/11. Borderline Marfanoid habitus.  2. Asthma.  3. CHF: Patient was admitted in 6/10 with CHF and volume overload. He was diuresed with IV lasix and felt better. Echo suggested that the RV was enlarged. RHC was done (after several doses of IV lasix) showing mean RA 13, RV 38/12, PA 42/23 mean 34, mean PCWP 15. PVR < 2 WU. Shunt run showed no O2 step up. Patient was hypoxic and was started O2. Cardiac MRI (6/10) showed normal LV size and systolic function EF [id number], D-shaped IV septum, moderate to severe RV dilation with RV EF 37%. The pulmonic valve opened normally. The heart was shifted leftward (pectus) with compression of the LA between the ascending and descending aorta. MRA of the chest showed normal pulmonary veins. CHF was primarily right-sided with mild pulmonary HTN noted. He is hypoxic, especially at night, so hypoxic pulmonary vasoconstriction could be playing a role. PCWP was only mildly elevated but I wonder if LA compression could be playing a role. Repeat echo after pectus surgery in 11/11 showed normal LV systolic function and  size with a D-shaped interventricular septum. The LA was still compressed between ascending and descending aorta. The RV was moderately dilated with mild systolic dysfunction.  Echo (5/16) with EF 5-60%, aortic root 4.0 cm, RV poorly vsiualized.  Cardiac MRI (6/16) with pectus excavatum, left-shifted heart, LA compressed between ascending and descending aorta, LV EF 58% with D-shaped interventricular septum suggestive of RV pressure/volume overload, moderate to severe RV dilation with mild to moderate RV systolic dysfunction  (RV EF 37%), dilated PA (this MRI is very similar to prior in 2010).  - RHC (8/20): mean RA 11, PA 51/30 mean 41, mean PCWP 18, PAPI 1.9, CVP/PCWP 0.61 - Limited echo for bubble study (8/20): Negative bubble study.  - Cardiac MRI (8/20): The left lung appears collapsed, compressed by leftwards shift of the heart and dilated RV.  Dilated main pulmonary artery. Normal LV size with mild diffuse hypokinesis, EF 52%. Markedly dilated RV with EF 31%, moderate hypokinesis. D-shaped interventricular septum, suggestive of RV pressure/volume overload. Small left atrium appears compressed between the aortic root/ascending aorta and the descending aorta/ventrebral column. Nonspecific LGE pattern at the RV insertion sites, suggestive of RV pressure/volume overload. 4. 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  - High resolution CT chest (8/19): Bilateral pleuroparenchymal scarring, cirrhosis noted. No ILD.  - CT chest (8/20): Severe pectus deformity, heart shifted left with extensive left lung compressive atelectasis.  5. 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 Patient is on Bipap.  6. Cirrhosis: Noted on RUQ [place] in 8/20.  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   Family History:  HTN-Mother   Social History:  The patient lives in [place] with his wife and son.  Patient currently out of work (prior in [employer]) => working towards disability.  He drinks 1-6 drinks per week, never having more than 3 drinks in 1 evening. No illicit drug use.  Patient states former smoker. Started at age 36. only smoked socially. Quit smoking at age 23.   Review of Systems  All systems reviewed and negative except as per HPI.  Current Outpatient Medications  Medication Sig Dispense Refill  . acetaminophen (TYLENOL) 500 MG tablet Take 1,000 mg by mouth every 6 (six) hours as needed for mild pain.    Marland Kitchen albuterol (PROAIR HFA) 108 (90 BASE) MCG/ACT inhaler Inhale 2 puffs  into the lungs every 6 (six) hours as needed for wheezing or shortness of breath. 8.5 each 0  . albuterol (PROVENTIL) (2.5 MG/3ML) 0.083% nebulizer solution Take 3 mLs (2.5 mg total) by nebulization every 6 (six) hours as needed for wheezing or shortness of breath. 75 mL 11  . bisoprolol (ZEBETA) 5 MG tablet Take 1 tablet (5 mg total) by mouth daily. 90 tablet 3  . budesonide-formoterol (SYMBICORT) 80-4.5 MCG/ACT inhaler Inhale 2 puffs into the lungs 2 (two) times a day. 2 Inhaler 0  . cetirizine (ZYRTEC) 10 MG tablet Take 1 tablet (10 mg total) by mouth daily. 30 tablet 2  . furosemide (LASIX) 40 MG tablet Take 1 tablet (40 mg total) by mouth 2 (two) times daily. 60 tablet 11  . isosorbide mononitrate (IMDUR) 30 MG 24 hr tablet Take 1 tablet (30 mg total) by mouth daily. Need appt for future refills [phone] 30 tablet 11  . Melatonin 10 MG TABS Take 10 mg by mouth at bedtime.    Marland Kitchen nystatin (MYCOSTATIN) 100000 UNIT/ML suspension Take 5 mLs (500,000 Units total) by mouth 4 (four) times daily. As needed for  thrush symptoms 60 mL 0  . Omeprazole (PRILOSEC PO) Take 1 capsule by mouth 2 (two) times daily.     . ondansetron (ZOFRAN) 4 MG tablet Take 1 tablet (4 mg total) by mouth every 6 (six) hours as needed for nausea. 20 tablet 0  . polyethylene glycol (MIRALAX / GLYCOLAX) 17 g packet Take 17 g by mouth daily as needed for mild constipation. 14 each 0  . spironolactone (ALDACTONE) 25 MG tablet Take 1 tablet (25 mg total) by mouth daily. 90 tablet 3  . potassium chloride SA (KLOR-CON) 20 MEQ tablet Take 1 tablet (20 mEq total) by mouth daily. 90 tablet 3   No current facility-administered medications for this encounter.     BP 121/73   Pulse 73   Wt 99.2 kg (218 lb 12.8 oz)   SpO2 96%   BMI 26.63 kg/m  General: NAD MSK: Pectus deformity.  Neck: No JVD, no thyromegaly or thyroid nodule.  Lungs: Clear to auscultation bilaterally with normal respiratory effort. CV: Nondisplaced PMI.  Heart  regular S1/S2, no S3/S4, no murmur.  No peripheral edema.  No carotid bruit.  Normal pedal pulses.  Abdomen: Soft, nontender, no hepatosplenomegaly, no distention.  Skin: Intact without lesions or rashes.  Neurologic: Alert and oriented x 3.  Psych: Normal affect. Extremities: No clubbing or cyanosis.  HEENT: Normal.   Assessment/Plan: 1. Chronic hypercarbic respiratory failure: Now stabilized, but recent admission with acute hypercarbic respiratory failure. CTA chest showed collapsed left lung with compression from dilated RV, no PE, RLL infiltrate (aspiration versus PNA). Of note, he would eat and drink while using his Bipap so this may have led to aspiration.  This appears to be primarily a mechanical problem with severe pectus and leftward shift of the heart with compression of the left lung.  Would consider a second surgical opinion.  Otherwise, he may end up with a tracheostomy.  - Dr. [name] is coordinating evaluation by a pulmonologist/surgeon experienced in management of severe pectus.  He has a telehealth appt with Dr. [name] in [place], will need to have records sent down.  - Referred to pulmonary rehab in [place].  2.Chronic diastolic CHF with RV failure: Patient presented with primarily right-sided CHF in 2010, likely due to pulmonary hypertension from a combination of left atrial compression and nocturnal hypoxemia. He is now using Bipap every night. He had a redo pectus surgery in 2011. I suspect the surgery relieved some of the compression on his lungs but followup cardiac MRI in 6/16 did show continued compression of the left atrium between the ascending and descending aorta as well as a dilated RV with RV EF 37%. Cardiac MRI in 6/16 was very similar to the MRI in 2010. RHC in 8/20 showed elevated right and left heart filling pressures with primarily pulmonary venous hypertension.  PAPI low but not markedly low, suggesting mild-moderate RV dysfunction. The MRI repeated in  8/20 showed left-shifted heart with marked RV enlargement and RVEF 31%, LV EF 52%.  The LA is compressed between ascending and descending aorta, which may explain elevated PCWP.  He does not look volume overloaded on exam but weight is up, suspect this is due to increased caloric intake and stopping Adderrall. NYHA class II symptoms as long as he wears oxygen.  - Continue Lasix 40 mg po bid, BMET today.  - Increase spironolactone to 25 mg daily and decrease KCl to 20 mEq daily.  BMET in 10 days.  -Continue Bipap at night and oxygen  during the day to lower drive for hypoxemic pulmonary vasoconstriction. 2. 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 RHC in 8/20 moderate pulmonary hypertension, PA pressure 51/30. Significant component of pulmonary venous hypertension.  There is unlikely to be a group 1 component to his PH, so not sure pulmonary vasodilators would be helpful.  3. 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 He has had chronic hypercarbic respiratory failure. This is a major player in his chronic dyspnea. He has had 2 operations already for his pectus deformity. CT in 8/20 showed heart shifted left with compression of left lung.  - See above, will need close pulmonary followup, Dr. [name] looking into a center where he can get another surgical opinion.   4. Asthma:Chronic. 5. Cirrhosis: Abdominal US showed cirrhosis.  He is not a heavy drinker.  My suspicion is that this may be related to RV failure.  - Needs eventual MRI of liver given hyperechoic right liver lesion.   Followup in 3 months.     Loralie Champagne 06/15/2019

## 2019-06-15 NOTE — Telephone Encounter (Signed)
Per MD, pt ordered for MRI of liver. Patient made aware of same. Advised that he will get a call to schedule this appointment.

## 2019-06-16 ENCOUNTER — Telehealth (HOSPITAL_COMMUNITY): Payer: Self-pay

## 2019-06-16 NOTE — Telephone Encounter (Signed)
Hard copies of CT scan, MRI and Echo mailed to Dr Holley Raring in [place] per Dr Aundra Dubin.  Also sent were copy of Previous PFT's and recent ABG's.

## 2019-06-17 NOTE — Telephone Encounter (Signed)
Called and left a detailed message for [name]. We need to verify the address prior to sending out the information.

## 2019-06-17 NOTE — Telephone Encounter (Signed)
Checked Dr. Golden Pop box at front desk. CD for the patient has been received.

## 2019-06-17 NOTE — Telephone Encounter (Signed)
[name] called back and verified the address. CT disc and other aforementioned documents have been placed in the outgoing mail for Dr. Sabra Heck.   Will send to MR as FYI.

## 2019-06-23 ENCOUNTER — Encounter (HOSPITAL_COMMUNITY): Payer: Self-pay | Admitting: Vascular Surgery

## 2019-06-24 ENCOUNTER — Telehealth: Payer: Self-pay | Admitting: Internal Medicine

## 2019-06-24 ENCOUNTER — Ambulatory Visit (HOSPITAL_COMMUNITY)
Admission: RE | Admit: 2019-06-24 | Discharge: 2019-06-24 | Disposition: A | Discharge status: Home / Self Care | Payer: Managed Care, Other (non HMO) | Source: Ambulatory Visit | Attending: Internal Medicine | Admitting: Internal Medicine

## 2019-06-24 ENCOUNTER — Other Ambulatory Visit: Payer: Self-pay

## 2019-06-24 DIAGNOSIS — I5032 Chronic diastolic (congestive) heart failure: Secondary | ICD-10-CM | POA: Insufficient documentation

## 2019-06-24 LAB — BASIC METABOLIC PANEL
Anion gap: 9 (ref 5–15)
BUN: 17 mg/dL (ref 6–20)
CO2: 35 mmol/L — ABNORMAL HIGH (ref 22–32)
Calcium: 8.8 mg/dL — ABNORMAL LOW (ref 8.9–10.3)
Chloride: 96 mmol/L — ABNORMAL LOW (ref 98–111)
Creatinine, Ser: 0.82 mg/dL (ref 0.61–1.24)
GFR calc Af Amer: >60 mL/min (ref >60)
GFR calc non Af Amer: >60 mL/min (ref >60)
Glucose, Bld: 119 mg/dL — ABNORMAL HIGH (ref 70–99)
Potassium: 4 mmol/L (ref 3.5–5.1)
Sodium: 140 mmol/L (ref 135–145)

## 2019-06-24 NOTE — Telephone Encounter (Signed)
PCCs, can you help with this? Thank you.  

## 2019-06-25 ENCOUNTER — Telehealth: Payer: Self-pay | Admitting: Primary Care

## 2019-06-25 NOTE — Telephone Encounter (Signed)
Spoke with Elmyra Ricks from pulmonary rehab at [place]. She had a question about who signed the form, she can't read the signature. Checked with Judeen Hammans, this form was signed by MR. Elmyra Ricks has been made aware of this information. Nothing further was needed.

## 2019-06-25 NOTE — Telephone Encounter (Signed)
This is in regards to the pt's pulmonary rehab referral. LMTCB x1 for [facility]. Message needs to be sent to [facility] when they return the call.

## 2019-06-25 NOTE — Telephone Encounter (Signed)
Stamped signature and faxed back to [facility] Joellen [place]

## 2019-06-25 NOTE — Telephone Encounter (Signed)
[facility] calling stating that they are unable to read signature on the referral needing a call on who is authorizing they can be reached @ [phone].Hillery Hunter

## 2019-06-25 NOTE — Telephone Encounter (Signed)
Called [employer] to see why the change of location pt agreed to change order cancelled in [facility] and order faxed to [facility] [phone] pt is aware Jo[facility] [place]

## 2019-06-28 ENCOUNTER — Other Ambulatory Visit: Payer: Self-pay

## 2019-06-28 ENCOUNTER — Telehealth (HOSPITAL_COMMUNITY): Payer: Self-pay

## 2019-06-28 ENCOUNTER — Ambulatory Visit (HOSPITAL_COMMUNITY)
Admission: RE | Admit: 2019-06-28 | Discharge: 2019-06-28 | Disposition: A | Discharge status: Home / Self Care | Payer: Managed Care, Other (non HMO) | Source: Ambulatory Visit | Attending: Cardiology | Admitting: Cardiology

## 2019-06-28 DIAGNOSIS — K769 Liver disease, unspecified: Secondary | ICD-10-CM | POA: Insufficient documentation

## 2019-06-28 MED ORDER — GADOBUTROL 1 MMOL/ML IV SOLN
10.0000 mL | Freq: Once | INTRAVENOUS | Status: AC | PRN
  Administered 2019-06-28: 10:00:00 10 mL via INTRAVENOUS

## 2019-06-28 NOTE — Telephone Encounter (Signed)
-----   Message from Larey Dresser, MD sent at 06/28/2019 11:35 AM EDT ----- Liver hemangioma, benign.  No cirrhosis.

## 2019-06-28 NOTE — Telephone Encounter (Signed)
Pt aware of results 

## 2019-06-28 NOTE — Telephone Encounter (Signed)
-----   Message from Valeda Malm, RN sent at 06/28/2019  2:06 PM EDT ----- Marykay Lex can you reach out to patient regarding disability stuff.

## 2019-06-28 NOTE — Telephone Encounter (Signed)
Spoke to pt about disability requirements for job. Pt states that he needs another official form filled out for [employer] stating that he is unable to work. He also states that he needs more short term disability forms filled out. Gave pt our fax number for the two companies to sent [place] further paper work to be filled out.

## 2019-06-30 ENCOUNTER — Encounter (HOSPITAL_COMMUNITY): Payer: Self-pay | Admitting: Cardiology

## 2019-07-05 ENCOUNTER — Telehealth: Payer: Self-pay | Admitting: Internal Medicine

## 2019-07-05 NOTE — Telephone Encounter (Signed)
Called and spoke with Angelica at Dr. Lindwood Qua office with [facility].  She is asking for the results from patient's last echo.  Called and spoke to patient. Patient stated that he was able to download the echo results from [place] and has emailed those in to Dr. Sabra Heck. The echo was originally order and resulted by Dr. Aundra Dubin. There is nothing needed at this time since patient was able to send the results over himself. Will close encounter.

## 2019-07-08 ENCOUNTER — Telehealth: Payer: Self-pay | Admitting: Internal Medicine

## 2019-07-08 NOTE — Telephone Encounter (Signed)
[name] the second had a CT scan of the chest done on June 29, 2019 at [facility] in [place].  Final result shows severe pectus deformity with significant mass-effect on the right heart with right atrial enlargement.  Volume loss with areas of linear scarring and compressive atelectasis throughout the left lung areas of bronchiectasis across the lungs.  No definite pulmonary embolism.  Right lower lobe pulmonary nodules 9 mm previously described on outside CT chest and enlarged pulmonary arteries.  Plan -Please ensure routine follow-up 3 months from his mid September 2020 visit

## 2019-07-14 ENCOUNTER — Telehealth: Payer: Self-pay | Admitting: Internal Medicine

## 2019-07-14 ENCOUNTER — Other Ambulatory Visit (HOSPITAL_BASED_OUTPATIENT_CLINIC_OR_DEPARTMENT_OTHER): Payer: Managed Care, Other (non HMO)

## 2019-07-14 NOTE — Telephone Encounter (Signed)
Please schedule video visit tomorrow with Aaron Edelman in the morning

## 2019-07-14 NOTE — Telephone Encounter (Signed)
Spoke with pt. States that he is not feeling well. Reports increased "bloating" in his chest and abdomen over the past 2 days. With this bloating is causing him to have more shortness of breath than usual. Stated, "I feel like I am retaining fluid." Pt is currently on Lasix 40mg  BID and Spironolactone 25mg  daily. Pt did not know what his current weight is. He is willing to do a telephone visit or an in person visit if needed.  Beth - please advise. Thanks!

## 2019-07-14 NOTE — Progress Notes (Signed)
Virtual Visit via Video Note  I connected with [name] on 07/15/19 at  9:00 AM EST by a video enabled telemedicine application and verified that I am speaking with the correct person using two identifiers.  Location: Patient: Home Provider: Office - [facility] - [id number], [address], [place], [place] [address]  I discussed the limitations of evaluation and management by telemedicine and the availability of in person appointments. The patient expressed understanding and agreed to proceed. I also discussed with the patient that there may be a patient responsible charge related to this service. The patient expressed understanding and agreed to proceed.  Patient consented to consult via telephone: Yes People present and their role in pt care: Pt   History of Present Illness:  38 year old male followed in our office for restrictive lung disease, chronic respiratory failure, pulmonary hypertension, asthma. Pt has Home vent - Triology.   Past medical history: Right-sided congestive heart failure (followed by Dr. Aundra Dubin) Smoking history: Former smoker.  Quit 2008.  0.1-pack-year smoking history. Maintenance: Symbicort 80 Patient of Dr. [name]  Chief complaint: Worsened shortness of breath   38 year old male former smoker completing a video visit with our office today due to increased weight.  Patient reports that his weight is increased to 222 pounds at home.  He reports that this is elevated for him.  He has noticed some lower extremity swelling as well as feeling like close fit tighter than usual.  He attributes this to his poor diet.  He reports he has been snacking and eating lots of candy.  He continues to admit that this "happens every year to him around this time".  Patient reports adherence to his diuretics.  He reports that he has missed a telephone call from his cardiology team.  Getting appt with 08/10/2019 - specialist in     Observations/Objective:  03/05/2019-SARS-CoV-2-not detected  04/21/2019-CT chest without contrast-severe pectus ex Capoten deformity of the chest with mass-effect upon the heart, marked central pulmonary arterial enlargement consistent with pulmonary arterial hypertension, focal area of masslike consolidation left upper lobe measuring 3.2 x 3.5 x 3.6 cm in size with internal air bronchograms, additional areas of scattered consolidation with regions of bronchiectasis and architectural distortion in both lungs could reflect a chronic infection or inflammatory process such as organizing pneumonia  June 29, 2019 at [facility] in [place].  Final result shows severe pectus deformity with significant mass-effect on the right heart with right atrial enlargement.  Volume loss with areas of linear scarring and compressive atelectasis throughout the left lung areas of bronchiectasis across the lungs.  No definite pulmonary embolism.  Right lower lobe pulmonary nodules 9 mm previously described on outside CT chest and enlarged pulmonary arteries.  08/21/2017-pulmonary function test-FVC 1.02 (16% predicted), postbronchodilator ratio 71, postbronchodilator FEV1 0.74 (15% predicted), no bronchodilator response, DLCO 21.18 (54% predicted)  04/22/2019-echocardiogram-LV ejection fraction 50 to 55%, right ventricle is moderately reduced systolic function, cavity was severely enlarged, D-shaped interventricular septum suggestive of RV pressure, volume overload, right atrial size was severely dilated, left ventricle has low normal systolic function with an ejection fraction of 50 to 55%, limited echo for bubble study, bubble study was negative no evidence of PFO or ASD  Assessment and Plan:  Chronic right-sided CHF (congestive heart failure) ([place]) Plan: Increase Lasix to 60 mg twice daily per cardiology Continue spironolactone Follow-up in 1 week with cardiology with lab work Continue  to weigh yourself daily Avoid high sodium  foods   Pulmonary hypertension ([place]) Plan: Increase Lasix to 60 mg twice daily Continue spironolactone Continue to weigh yourself daily Continue home trilogy vent   Chronic respiratory failure with hypoxia and hypercapnia ([place]) Plan: Continue home trilogy vent Follow-up with cardiology in 1 week with lab work  Moderate persistent asthma in adult without complication Plan: Continue Symbicort  Restrictive lung disease Plan: Continue trilogy vent Continue planned appointment in December/2020 with specialist in [place] to further evaluate  Pectus excavatum He has had 2 reconstructive surgeries for his pectus excavatum, first in childhood at the age of 40 (Dr. Lilia Pro in [place]) and again in 2011 with [facility]  He was re-evaluated in 2019 for further surgical options at [facility] where it was determined that there were none Referral placed today to Dr. [name] in [place] for second opinion   Plan: Patient reports he has appointment on 08/10/2019 in [place]   Follow Up Instructions:  Return in about 3 weeks (around 08/05/2019), or if symptoms worsen or fail to improve, for Follow up with Dr. Purnell Shoemaker.   I discussed the assessment and treatment plan with the patient. The patient was provided an opportunity to ask questions and all were answered. The patient agreed with the plan and demonstrated an understanding of the instructions.   The patient was advised to call back or seek an in-person evaluation if the symptoms worsen or if the condition fails to improve as anticipated.  I provided 26 minutes of non-face-to-face time during this encounter.   Lauraine Rinne, NP

## 2019-07-14 NOTE — Telephone Encounter (Signed)
Would have him increase his Lasix to 60 mg bid with BMET in 1 week and get him in for appt.

## 2019-07-14 NOTE — Telephone Encounter (Signed)
Can try to further assess this with a virtual visit as scheduled for tomorrow.  Patient also has established with cardiology.  Who is the group managing his diuretics.  If he is retaining fluid as well as having worsened breathing related to this.  He also needs to be instructed to follow-up with cardiology so that way they are aware.  Routing to Dr. Aundra Dubin and Dr. Purnell Shoemaker, as Juluis Rainier. Pt scheduled for virtual visit with pulmonary tomorrow. Pt also will likely need cardiology follow up.   Wyn Quaker FNP

## 2019-07-14 NOTE — Telephone Encounter (Signed)
Spoke with pt. He has been scheduled with Aaron Edelman at 0900 for a video visit. Nothing further was needed.

## 2019-07-15 ENCOUNTER — Telehealth (INDEPENDENT_AMBULATORY_CARE_PROVIDER_SITE_OTHER): Payer: Managed Care, Other (non HMO) | Admitting: Pulmonary Disease

## 2019-07-15 ENCOUNTER — Encounter: Payer: Self-pay | Admitting: Pulmonary Disease

## 2019-07-15 DIAGNOSIS — J4540 Moderate persistent asthma, uncomplicated: Secondary | ICD-10-CM

## 2019-07-15 DIAGNOSIS — J9612 Chronic respiratory failure with hypercapnia: Secondary | ICD-10-CM

## 2019-07-15 DIAGNOSIS — I50812 Chronic right heart failure: Secondary | ICD-10-CM

## 2019-07-15 DIAGNOSIS — I272 Pulmonary hypertension, unspecified: Secondary | ICD-10-CM | POA: Diagnosis not present

## 2019-07-15 DIAGNOSIS — Q676 Pectus excavatum: Secondary | ICD-10-CM

## 2019-07-15 DIAGNOSIS — J984 Other disorders of lung: Secondary | ICD-10-CM

## 2019-07-15 DIAGNOSIS — J9611 Chronic respiratory failure with hypoxia: Secondary | ICD-10-CM

## 2019-07-15 MED ORDER — FUROSEMIDE 20 MG PO TABS: ORAL_TABLET | ORAL | 0 refills | Status: DC

## 2019-07-15 NOTE — Telephone Encounter (Signed)
The patient did eventually logon for his video visit.  I did speak with him.  He knows that he is missed a telephone call from your office.  He reports that he will call you back today.  Weight is up to 222lbs.   I did send an additional 20 mg of Lasix for him to take in addition to his already scheduled 40 mg twice daily dose.  The patient now will be taking 60 mg twice daily until he can be seen in your office next week with lab work.  He agrees to this.  Patient reports adherence to his spironolactone.  Unfortunately patient has been having a poor diet.  He reports that he is eating a lot of junk food.  Overall video exam was fairly stable.  No blatant signs of hypoxia.  He reports adherence to his trilogy home vent.  Please let us know if there is anything else that we can do to help.  We have got him scheduled for a short-term follow-up in 3 weeks to see Dr. [name].  He has a upcoming follow-up with a specialist in [place] on 08/10/2019.  Wyn Quaker, FNP

## 2019-07-15 NOTE — Assessment & Plan Note (Signed)
Plan: Continue home trilogy vent Follow-up with cardiology in 1 week with lab work

## 2019-07-15 NOTE — Telephone Encounter (Signed)
Left message to return call 

## 2019-07-15 NOTE — Assessment & Plan Note (Signed)
Plan: Continue trilogy vent Continue planned appointment in December/2020 with specialist in [place] to further evaluate

## 2019-07-15 NOTE — Assessment & Plan Note (Signed)
Plan: °Continue Symbicort °

## 2019-07-15 NOTE — Assessment & Plan Note (Signed)
Plan: Increase Lasix to 60 mg twice daily per cardiology Continue spironolactone Follow-up in 1 week with cardiology with lab work Continue to weigh yourself daily Avoid high sodium foods

## 2019-07-15 NOTE — Assessment & Plan Note (Signed)
He has had 2 reconstructive surgeries for his pectus excavatum, first in childhood at the age of 68 (Dr. Lilia Pro in [place]) and again in 2011 with [facility]  He was re-evaluated in 2019 for further surgical options at [facility] where it was determined that there were none Referral placed today to Dr. [name] in [place] for second opinion   Plan: Patient reports he has appointment on 08/10/2019 in [place]

## 2019-07-15 NOTE — Assessment & Plan Note (Signed)
Plan: Increase Lasix to 60 mg twice daily Continue spironolactone Continue to weigh yourself daily Continue home trilogy vent

## 2019-07-15 NOTE — Telephone Encounter (Signed)
FYI to Dr. [name] and Dr. Aundra Dubin.  We have attempted to reach the patient multiple times.  He never connected to the [place] video visit.  I see the recommendations listed below by Dr. Aundra Dubin.  If you are able to get in touch with the patient I will make this changes.  We appreciate the joint effort to get the patient scheduled.  Wyn Quaker, FNP

## 2019-07-15 NOTE — Patient Instructions (Addendum)
You were seen today by Lauraine Rinne, NP  for:   1. Pulmonary hypertension (HCC)  - furosemide (LASIX) 20 MG tablet; Take 60mg  twice daily till follow up. Take as prescribed on AVS.  Dispense: 14 tablet; Refill: 0  As discussed at visit today.  Please increase your Lasix to 60 mg twice daily  Continue to take the 40 mg that you already have and add the 20 mg that I am prescribing today for a total of 60 mg twice daily  Contact cardiology to schedule a 1 week follow-up with lab work in their office  2. Chronic right-sided CHF (congestive heart failure) (HCC)  - furosemide (LASIX) 20 MG tablet; Take 60mg  twice daily till follow up. Take as prescribed on AVS.  Dispense: 14 tablet; Refill: 0  Continue to weigh yourself Increase Lasix to 60 mg twice daily Continue spironolactone as ordered Follow-up with cardiology in 1 week with lab work Contact cardiology to schedule this appointment  Patient Education for Congestive Heart Failure  Do the following things EVERY DAY:   1. Weigh yourself EVERY morning after you go to the bathroom but before you eat or drink anything. Write this number down in a weight log/diary.    2. Take your medicines as prescribed. If you have concerns about your medications, please call us before you stop taking them.    3. Eat low salt foods-Limit salt (sodium) to 2000 mg per day. This will help prevent your body from holding onto fluid. Read food labels as many processed foods have a lot of sodium, especially canned goods and prepackaged meats. If you would like some assistance choosing low sodium foods, we would be happy to set you up with a nutritionist.   4. Stay as active as you can everyday. Staying active will give you more energy and make your muscles stronger. Start with 5 minutes at a time and work your way up to 30 minutes a day. Break up your activities--do some in the morning and some in the afternoon. Start with 3 days per week and work your way up to 5  days as you can.  If you have chest pain, feel short of breath, dizzy, or lightheaded, STOP. If you don't feel better after a short rest, call 911. If you do feel better, call the office to let us know you have symptoms with exercise.   5. Limit all fluids for the day to less than 2 liters. Fluid includes all drinks, coffee, juice, ice chips, soup, jello, and all other liquids.   3. Chronic respiratory failure with hypoxia and hypercapnia (HCC)  - furosemide (LASIX) 20 MG tablet; Take 60mg  twice daily till follow up. Take as prescribed on AVS.  Dispense: 14 tablet; Refill: 0  Continue trilogy home vent Continue oxygen as prescribed  4. Restrictive lung disease  We will schedule a 4-week follow-up with Dr. [name] Continue forward with appointment in December/2020 with specialist in [place]  5. Moderate persistent asthma in adult without complication  Continue Symbicort 80 >>> 2 puffs in the morning right when you wake up, rinse out your mouth after use, 12 hours later 2 puffs, rinse after use >>> Take this daily, no matter what >>> This is not a rescue inhaler    6. Pectus excavatum    We recommend today:  No orders of the defined types were placed in this encounter.  No orders of the defined types were placed in this encounter.  Meds ordered this encounter  Medications  . furosemide (LASIX) 20 MG tablet    Sig: Take 60mg  twice daily till follow up. Take as prescribed on AVS.    Dispense:  14 tablet    Refill:  0    Follow Up:    Return in about 3 weeks (around 08/05/2019), or if symptoms worsen or fail to improve, for Follow up with Dr. Purnell Shoemaker.   Please do your part to reduce the spread of COVID-19:      Reduce your risk of any infection  and COVID19 by using the similar precautions used for avoiding the common cold or flu:  Marland Kitchen Wash your hands often with soap and warm water for at least 20 seconds.  If soap and water are not readily available, use an  alcohol-based hand sanitizer with at least 60% alcohol.  . If coughing or sneezing, cover your mouth and nose by coughing or sneezing into the elbow areas of your shirt or coat, into a tissue or into your sleeve (not your hands). Langley Gauss A MASK when in public  . Avoid shaking hands with others and consider head nods or verbal greetings only. . Avoid touching your eyes, nose, or mouth with unwashed hands.  . Avoid close contact with people who are sick. . Avoid places or events with large numbers of people in one location, like concerts or sporting events. . If you have some symptoms but not all symptoms, continue to monitor at home and seek medical attention if your symptoms worsen. . If you are having a medical emergency, call 911.   [place] / e-Visit: [email or web address]         [facility]: [facility]: [id number]                   [facility]: [id number]     It is flu season:   >>> Best ways to protect herself from the flu: Receive the yearly flu vaccine, practice good hand hygiene washing with soap and also using hand sanitizer when available, eat a nutritious meals, get adequate rest, hydrate appropriately   Please contact the office if your symptoms worsen or you have concerns that you are not improving.   Thank you for choosing [facility] for your healthcare, and for allowing [place] to partner with you on your healthcare journey. I am thankful to be able to provide care to you today.   Wyn Quaker FNP-C

## 2019-07-19 NOTE — Telephone Encounter (Signed)
Pt does have a f/u appt scheduled with MR. Nothing further needed.

## 2019-07-22 ENCOUNTER — Ambulatory Visit (HOSPITAL_COMMUNITY)
Admission: RE | Admit: 2019-07-22 | Discharge: 2019-07-22 | Disposition: A | Discharge status: Home / Self Care | Payer: Managed Care, Other (non HMO) | Source: Ambulatory Visit | Attending: Internal Medicine | Admitting: Internal Medicine

## 2019-07-22 ENCOUNTER — Other Ambulatory Visit: Payer: Self-pay

## 2019-07-22 VITALS — BP 136/78 | HR 90 | Wt 223.2 lb

## 2019-07-22 DIAGNOSIS — J45909 Unspecified asthma, uncomplicated: Secondary | ICD-10-CM | POA: Diagnosis not present

## 2019-07-22 DIAGNOSIS — I110 Hypertensive heart disease with heart failure: Secondary | ICD-10-CM | POA: Diagnosis not present

## 2019-07-22 DIAGNOSIS — Z87891 Personal history of nicotine dependence: Secondary | ICD-10-CM | POA: Diagnosis not present

## 2019-07-22 DIAGNOSIS — Z79899 Other long term (current) drug therapy: Secondary | ICD-10-CM | POA: Diagnosis not present

## 2019-07-22 DIAGNOSIS — Z8249 Family history of ischemic heart disease and other diseases of the circulatory system: Secondary | ICD-10-CM | POA: Diagnosis not present

## 2019-07-22 DIAGNOSIS — I50812 Chronic right heart failure: Secondary | ICD-10-CM | POA: Diagnosis not present

## 2019-07-22 DIAGNOSIS — J9612 Chronic respiratory failure with hypercapnia: Secondary | ICD-10-CM | POA: Diagnosis present

## 2019-07-22 DIAGNOSIS — Z7951 Long term (current) use of inhaled steroids: Secondary | ICD-10-CM | POA: Diagnosis not present

## 2019-07-22 DIAGNOSIS — I5032 Chronic diastolic (congestive) heart failure: Secondary | ICD-10-CM | POA: Insufficient documentation

## 2019-07-22 DIAGNOSIS — I272 Pulmonary hypertension, unspecified: Secondary | ICD-10-CM | POA: Insufficient documentation

## 2019-07-22 LAB — BASIC METABOLIC PANEL
Anion gap: 8 (ref 5–15)
BUN: 14 mg/dL (ref 6–20)
CO2: 37 mmol/L — ABNORMAL HIGH (ref 22–32)
Calcium: 8.7 mg/dL — ABNORMAL LOW (ref 8.9–10.3)
Chloride: 94 mmol/L — ABNORMAL LOW (ref 98–111)
Creatinine, Ser: 0.67 mg/dL (ref 0.61–1.24)
GFR calc Af Amer: >60 mL/min (ref >60)
GFR calc non Af Amer: >60 mL/min (ref >60)
Glucose, Bld: 107 mg/dL — ABNORMAL HIGH (ref 70–99)
Potassium: 3.8 mmol/L (ref 3.5–5.1)
Sodium: 139 mmol/L (ref 135–145)

## 2019-07-22 LAB — BRAIN NATRIURETIC PEPTIDE: B Natriuretic Peptide: 14 pg/mL (ref 0.0–100.0)

## 2019-07-22 NOTE — Addendum Note (Signed)
Encounter addended by: Valeda Malm, RN on: 07/22/2019 2:51 PM  Actions taken: Clinical Note Signed, Flowsheet accepted

## 2019-07-22 NOTE — Progress Notes (Signed)
ReDS Vest / Clip - 07/22/19 1100      ReDS Vest / Clip   Station Marker  C    Ruler Value  30    ReDS Value  Moderate volume overload   38%

## 2019-07-22 NOTE — Progress Notes (Signed)
Advanced Heart Failure Clinic Progress Note    Patient ID: [name], male   DOB: 1981-08-15, 38 y.o.   MRN: [id number] PCP: Dr. Tollie Pizza Cardiology: Dr. Aundra Dubin  38 y.o. male with history of pectus excavatum s/p repair in childhood and redo pectus repair in 6/11 returns to cardiology clinic for followup of RV failure.   He has chronic hypercarbic and and nocturnal hypoxemic respiratory failure.  He has been wearing Bipap at night.  Oxygen saturation drops with heavy exercise, so he uses oxygen when he will be exercising.  Echo 5/16 showed normal LV EF but did not show the RV well. He was ordered to get a cardiac MRI in 6/16 for better view of the RV.  This showed moderate to severe RV dilation with mild to moderate RV systolic dysfunction, RV EF 37%.  This looked very similar to the 2010 MRI.   In 8/20, he was admitted to the hospital in [place] with acute hypercarbic respiratory failure.  He was intubated, eventually able to extubate. He was extensively diuresed.  Cardiac MRI showed markedly dilated RV with moderately decreased systolic function, D-shaped septum, and left-ward shift of the heart.  The left atrium was compressed between the ascending and descending aorta.  RHC showed elevated right and left heart filling pressures.  Echo bubble study was negative. Aspiration PNA was also a concern at this admission.   At last OV 06/14/19, he was seen by Dr. Aundra Dubin. He was using a Trilogy Bipap with full facemask and requiring supplemental O2 24/7.  His weight was up and he endorsed increased dyspnea w/ weight gain. He was continued on lasix 40 bid and his spironolactone was increased to 25 mg daily. Regarding his pectus excavatum, plan was to refer to a pulmonologist/surgeon experienced in management of severe pectus for consideration for redo surgery. This was to be coordinated by Dr. [name] and he was to have a telehealth consult w/ Dr. [name] in [place].   We received phone call 11/4  stating that he had additional wt gain and increased dyspnea. He was advised to further increase Lasix to 60 mg bid and to make a f/u appt.   Today in clinic, he reports improved symptoms. Breathing back to baseline and still w/ supplemental O2 requirements, on 3L. His weight is still up, but in hindsight, he believes recent wt gain likely 2/2 eating more after stopping his Adderall.   In regards to his pectus excavatum, he has had consultation w/ Dr. Sabra Heck in [place] and is tentatively scheduled for redo surgery on 12/1.    Labs (8/10): BNP 9, K 3.6, creatinine 0.7  Labs (2/14): K 3.8, creatinine 0.8, BNP not elevated Labs (2/16): K 3.4, creatinine 0.81 Labs (6/16): K 4, creatinine 0.73, BNP 12 Labs (11/18): K 4.1, creatinine 0.82, pro-BNP 6, hgb 15.2 Labs (7/20): pro-BNP 62, K 5, creatinine 0.85  Labs (8/20): K 4.6, creatinine 0.69 Labs (9/20): ABG 7.35/59/87 Labs (10/20): K 4.0. SCr 0.82    Allergies (verified):  No Known Drug Allergies   Past Medical History:  1. Pectus excavatum status post repair in [place] in childhood and repeat repair at [facility] in 6/11. Borderline Marfanoid habitus.  2. Asthma.  3. CHF: Patient was admitted in 6/10 with CHF and volume overload. He was diuresed with IV lasix and felt better. Echo suggested that the RV was enlarged. RHC was done (after several doses of IV lasix) showing mean RA 13, RV 38/12, PA 42/23 mean 34, mean PCWP  15. PVR < 2 WU. Shunt run showed no O2 step up. Patient was hypoxic and was started O2. Cardiac MRI (6/10) showed normal LV size and systolic function EF [id number], D-shaped IV septum, moderate to severe RV dilation with RV EF 37%. The pulmonic valve opened normally. The heart was shifted leftward (pectus) with compression of the LA between the ascending and descending aorta. MRA of the chest showed normal pulmonary veins. CHF was primarily right-sided with mild pulmonary HTN noted. He is hypoxic, especially at night, so hypoxic pulmonary  vasoconstriction could be playing a role. PCWP was only mildly elevated but I wonder if LA compression could be playing a role. Repeat echo after pectus surgery in 11/11 showed normal LV systolic function and size with a D-shaped interventricular septum. The LA was still compressed between ascending and descending aorta. The RV was moderately dilated with mild systolic dysfunction.  Echo (5/16) with EF 5-60%, aortic root 4.0 cm, RV poorly vsiualized.  Cardiac MRI (6/16) with pectus excavatum, left-shifted heart, LA compressed between ascending and descending aorta, LV EF 58% with D-shaped interventricular septum suggestive of RV pressure/volume overload, moderate to severe RV dilation with mild to moderate RV systolic dysfunction (RV EF 37%), dilated PA (this MRI is very similar to prior in 2010).  - RHC (8/20): mean RA 11, PA 51/30 mean 41, mean PCWP 18, PAPI 1.9, CVP/PCWP 0.61 - Limited echo for bubble study (8/20): Negative bubble study.  - Cardiac MRI (8/20): The left lung appears collapsed, compressed by leftwards shift of the heart and dilated RV.  Dilated main pulmonary artery. Normal LV size with mild diffuse hypokinesis, EF 52%. Markedly dilated RV with EF 31%, moderate hypokinesis. D-shaped interventricular septum, suggestive of RV pressure/volume overload. Small left atrium appears compressed between the aortic root/ascending aorta and the descending aorta/ventrebral column. Nonspecific LGE pattern at the RV insertion sites, suggestive of RV pressure/volume overload. 4. 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  - High resolution CT chest (8/19): Bilateral pleuroparenchymal scarring, cirrhosis noted. No ILD.  - CT chest (8/20): Severe pectus deformity, heart shifted left with extensive left lung compressive atelectasis.  5. 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 Patient is on Bipap.  6. Cirrhosis: Noted on RUQ [place] in 8/20.   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   Family History:  HTN-Mother   Social History:  The patient lives in [place] with his wife and son.  Patient currently out of work (prior in [employer]) => working towards disability.  He drinks 1-6 drinks per week, never having more than 3 drinks in 1 evening. No illicit drug use.  Patient states former smoker. Started at age 49. only smoked socially. Quit smoking at age 83.   Review of Systems  All systems reviewed and negative except as per HPI.  Current Outpatient Medications  Medication Sig Dispense Refill  . acetaminophen (TYLENOL) 500 MG tablet Take 1,000 mg by mouth every 6 (six) hours as needed for mild pain.    Marland Kitchen albuterol (PROAIR HFA) 108 (90 BASE) MCG/ACT inhaler Inhale 2 puffs into the lungs every 6 (six) hours as needed for wheezing or shortness of breath. 8.5 each 0  . bisoprolol (ZEBETA) 5 MG tablet Take 1 tablet (5 mg total) by mouth daily. 90 tablet 3  . budesonide-formoterol (SYMBICORT) 80-4.5 MCG/ACT inhaler Inhale 2 puffs into the lungs 2 (two) times a day. 2 Inhaler 0  . cetirizine (ZYRTEC) 10 MG tablet Take 1 tablet (10 mg  total) by mouth daily. 30 tablet 2  . furosemide (LASIX) 20 MG tablet Take 60mg  twice daily till follow up. Take as prescribed on AVS. 14 tablet 0  . isosorbide mononitrate (IMDUR) 30 MG 24 hr tablet Take 1 tablet (30 mg total) by mouth daily. Need appt for future refills [phone] 30 tablet 11  . Melatonin 10 MG TABS Take 10 mg by mouth at bedtime.    . Omeprazole (PRILOSEC PO) Take 1 capsule by mouth 2 (two) times daily.     . potassium chloride SA (KLOR-CON) 20 MEQ tablet Take 1 tablet (20 mEq total) by mouth daily. 90 tablet 3  . spironolactone (ALDACTONE) 25 MG tablet Take 1 tablet (25 mg total) by mouth daily. 90 tablet 3   No current facility-administered medications for this encounter.     BP 136/78   Pulse 90   Wt 101.2 kg (223 lb 3.2 oz)   SpO2 93% Comment: 3L  BMI 27.17 kg/m   PHYSICAL  EXAM: General:  Well appearing young WM on supplemental O2. No respiratory difficulty HEENT: normal Neck: supple. no JVD. Carotids 2+ bilat; no bruits. No lymphadenopathy or thyromegaly appreciated. Cor: PMI nondisplaced. Regular rate & rhythm. No rubs, gallops or murmurs. Lungs: clear Abdomen: soft, nontender, nondistended. No hepatosplenomegaly. No bruits or masses. Good bowel sounds. Extremities: no cyanosis, clubbing, rash, edema Neuro: alert & oriented x 3, cranial nerves grossly intact. moves all 4 extremities w/o difficulty. Affect pleasant. MSK: Pectus deformity.   Assessment/Plan: 1. Chronic hypercarbic respiratory failure: Now stabilized, but recent admission with acute hypercarbic respiratory failure 8/20. CTA chest showed collapsed left lung with compression from dilated RV, no PE, RLL infiltrate (aspiration versus PNA). Of note, he would eat and drink while using his Bipap so this may have led to aspiration.  This appears to be primarily a mechanical problem with severe pectus and leftward shift of the heart with compression of the left lung.  He was referred for second surgical opinion. Has been evaluated by Dr. [name] in [place] w/ plans to undergo redo surgery on 12/1 in [place].   2.Chronic diastolic CHF with RV failure: Patient presented with primarily right-sided CHF in 2010, likely due to pulmonary hypertension from a combination of left atrial compression and nocturnal hypoxemia. He is now using Bipap every night. He had a redo pectus surgery in 2011. Suspect the surgery relieved some of the compression on his lungs but followup cardiac MRI in 6/16 did show continued compression of the left atrium between the ascending and descending aorta as well as a dilated RV with RV EF 37%. Cardiac MRI in 6/16 was very similar to the MRI in 2010. RHC in 8/20 showed elevated right and left heart filling pressures with primarily pulmonary venous hypertension.  PAPI low but not markedly low,  suggesting mild-moderate RV dysfunction. The MRI repeated in 8/20 showed left-shifted heart with marked RV enlargement and RVEF 31%, LV EF 52%.  The LA is compressed between ascending and descending aorta, which may explain elevated PCWP.   - He appears euvolemic on exam. Unable get ReDs clip reading due to structual deformity, but BNP normal at 14.  His weight is up from prior baseline but suspect this is due to increased caloric intake after stopping Adderrall.  -NYHA class II symptoms as long as he wears oxygen.  - Continue Lasix 60 mg po bid (BNP normal today 14). BMP also obtained today and K and SCr WNL.  - Continue spironolactone to 25  mg daily  -Continue Bipap at night and oxygen during the day to lower drive for hypoxemic pulmonary vasoconstriction. 2. 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 RHC in 8/20 moderate pulmonary hypertension, PA pressure 51/30. Significant component of pulmonary venous hypertension.  There is unlikely to be a group 1 component to his PH, so not sure pulmonary vasodilators would be helpful.  3. 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 He has had chronic hypercarbic respiratory failure. This is a major player in his chronic dyspnea. He has had 2 operations already for his pectus deformity. CT in 8/20 showed heart shifted left with compression of left lung.  - Tentatively scheduled for redo surgery on 12/1 by Dr. Sabra Heck in [place] 4. Asthma:Chronic. 5. Abnormal Abdominal US: study completed 04/2019 was concerning for cirrhosis w/ 1.2 cm hyperechoic RIGHT hepatic lesion.  He is not a heavy drinker. - Had f/u imaging w/ MRI of liver 10/20 that showed benign liver hemangioma. No cirrhosis.  Follow up w/ Dr.  Aundra Dubin in 3 months.     [name] 07/22/2019

## 2019-07-22 NOTE — Patient Instructions (Signed)
No medication changes today!   Labs today We will only contact you if something comes back abnormal or we need to make some changes. Otherwise no news is good news!  Your physician recommends that you schedule a follow-up appointment in: 3 months with Dr Aundra Dubin  At the [facility], you and your health needs are our priority. As part of our continuing mission to provide you with exceptional heart care, we have created designated Provider Care Teams. These Care Teams include your primary Cardiologist (physician) and Advanced Practice Providers (APPs- Physician Assistants and Nurse Practitioners) who all work together to provide you with the care you need, when you need it.   You may see any of the following providers on your designated Care Team at your next follow up: Marland Kitchen Dr Glori Bickers . Dr Loralie Champagne . Darrick Grinder, NP . [name], PA   Please be sure to bring in all your medications bottles to every appointment.

## 2019-07-30 ENCOUNTER — Other Ambulatory Visit: Payer: Self-pay

## 2019-07-30 ENCOUNTER — Encounter: Payer: Self-pay | Admitting: Internal Medicine

## 2019-07-30 ENCOUNTER — Ambulatory Visit (INDEPENDENT_AMBULATORY_CARE_PROVIDER_SITE_OTHER): Payer: Managed Care, Other (non HMO) | Admitting: Internal Medicine

## 2019-07-30 ENCOUNTER — Telehealth: Payer: Self-pay | Admitting: Internal Medicine

## 2019-07-30 VITALS — BP 120/70 | HR 72 | Ht 76.0 in | Wt 230.6 lb

## 2019-07-30 DIAGNOSIS — Q676 Pectus excavatum: Secondary | ICD-10-CM

## 2019-07-30 DIAGNOSIS — J9611 Chronic respiratory failure with hypoxia: Secondary | ICD-10-CM | POA: Diagnosis not present

## 2019-07-30 DIAGNOSIS — J9612 Chronic respiratory failure with hypercapnia: Secondary | ICD-10-CM

## 2019-07-30 LAB — BASIC METABOLIC PANEL
BUN: 14 mg/dL (ref 6–23)
CO2: 46 mEq/L — ABNORMAL HIGH (ref 19–32)
Calcium: 9 mg/dL (ref 8.4–10.5)
Chloride: 92 mEq/L — ABNORMAL LOW (ref 96–112)
Creatinine, Ser: 0.75 mg/dL (ref 0.40–1.50)
GFR: 115.67 mL/min (ref >60.00)
Glucose, Bld: 111 mg/dL — ABNORMAL HIGH (ref 70–99)
Potassium: 3.4 mEq/L — ABNORMAL LOW (ref 3.5–5.1)
Sodium: 141 mEq/L (ref 135–145)

## 2019-07-30 NOTE — Progress Notes (Addendum)
OV 05/25/2019  Subjective:  Patient ID: [name], male , DOB: 1981-01-05 , age 38 y.o. , MRN: [id number] , ADDRESS: [place] [address]   05/25/2019 -   Chief Complaint  Patient presents with  . Restrictive Lung Disease    Feels a little better since last visit.     ICD-10-CM   1. Chronic respiratory failure with hypoxia and hypercapnia (HCC)  J96.11    J96.12   2. Pectus excavatum  Q67.6      HPI [name] 38 y.o. -presents for follow-up of the above issues.  He was hospitalized approximately just over a month ago with significant acute on chronic hypercapnic and hypoxemic respiratory failure.  PCO2 was greater than 100 at that time.  He ultimately got discharged.  In between he saw my nurse practitioner and his PCO2 had come down to 59.  He is on trilogy ventilator at night along with 3 L of oxygen at night.  He is also on 1-2 L of oxygen in the daytime.  He has quit working.  He might be applying for disability him not so sure.  Is up-to-date with his vaccination status.  We have been looking at connecting him with a pectus excavatum redo surgeon.  A month ago I spoke to the surgeon Dr. Hinton Lovely at [facility].  She does not do anymore redo surgeries.  She referred [place] to Dr. Linna Hoff in [place].  Nurse practitioner has made a referral but neither as an outpatient have heard from him.  We also sent his CD-ROM of the CT scan to Dr. Threasa Alpha at [facility] in [place].  Waiting to hear.  Patient is interested in a referral to the [place] clinic because his family in the [place] area.  I have contacted Dr. West Carbo at the [facility] clinic to inquire about potential surgeon      OV 07/30/2019  Subjective:  Patient ID: [name], male , DOB: 08-01-1981 , age 38 y.o. , MRN: [id number] , ADDRESS: [place] [address]   07/30/2019 -   Chief Complaint  Patient presents with  . Follow-up    Pt states he still has  problems with his breathing. Denies any problems with coughing. Pt is on oxygen and goes between 2-3L   Follow-up chronic hypoxemic and hypercapnic respiratory failure from severe pectus excavatum  HPI [name] II 38 y.o. -last seen by myself mid September 2020.  In the interim he was able to establish contact with Dr. [name] surgeon in [place].  He saw them probably this month 2020.  He says he is being scheduled for surgery early December 2020.  He says he was extremely pleased with the surgeon's visit.  Redo surgery by placing bars and opening up 6 inches of space inside his thoracic cavity has been discussed.  He says he does not want to see another institution like [facility] in [place] or [facility].  He is hopeful that he will have a good outcome after the surgery.  He says the surgery might be delayed because of the COVID-19 pandemic.  He is frustrated by this.  Overall he feels his health is stable.  He has gained some weight after stopping the Adderall.  His Lasix has been increased recently and this is helped his dyspnea.  He is on 3 L of oxygen and Trilegy ventilator at night.  He does  not feel he is hypercapnic.  He does not think he needs a blood gas.  But his symptom scores are worse compared to September 2020.   Venous CO2 was suggest that when the CO2 in the venous system is over 48 correlates with hypercapnia.  Currently it is 70 and still is a bit higher than before but it could also reflect metabolic alkalosis from Lasix.  SYMPTOM SCALE - 05/25/2019  07/30/2019   O2 use  2-3L at rest and 3-4 with exertion and TRiology qhs  Shortness of Breath 0 -> 5 scale with 5 being worst (score 6 If unable to do)   At rest 0 2  Simple tasks - showers, clothes change, eating, shaving 0 3  Household (dishes, doing bed, laundry) 3 4  Shopping 1 4  Walking level at own pace 2 3  Walking keeping up with others of same age 65 4  Walking up Stairs 4 5  Walking up  Hill 4 5  Total (40 - 48) Dyspnea Score 17 30  How bad is your cough? 0 n/a  How bad is your fatigue 0 3        Results for [name], [name] (MRN [id number]) as of 05/25/2019 09:48  Ref. Range 04/25/2019 07:19 04/26/2019 05:26 04/26/2019 08:05 04/27/2019 08:35 05/14/2019 14:54  pH, Arterial Latest Ref Range: 7.350 - 7.450  7.193 (LL) 7.181 (LL) 7.231 (L) 7.316 (L) 7.351  pCO2 arterial Latest Ref Range: 32.0 - 48.0 mmHg 110 (HH) 119 (HH) 103 (HH) 88.1 (HH) 59.1 (H)  pO2, Arterial Latest Ref Range: 83.0 - 108.0 mmHg 74.2 (L) 85.7 91.4 95.3 86.8   Results for [name], [name] (MRN [id number]) as of 07/30/2019 10:38  Ref. Range 06/14/2019 15:44 06/24/2019 10:51 06/28/2019 09:51 07/22/2019 11:43 07/22/2019 12:26  CO2 Latest Ref Range: 22 - 32 mmol/L 35 (H) 35 (H)   37 (H)   ROS - per HPI     has a past medical history of Asthma, CHF (congestive heart failure) ([facility]), Chronic hypercapnic respiratory failure ([place]), Pectus excavatum, and Restrictive lung disease.   reports that he quit smoking about 12 years ago. His smoking use included cigarettes. He has a 0.10 pack-year smoking history. He has never used smokeless tobacco.  Past Surgical History:  Procedure Laterality Date  . KNEE SURGERY     right  . PECTUS EXCAVATUM REPAIR    . RIGHT HEART CATH N/A 04/21/2019   Procedure: RIGHT HEART CATH;  Surgeon: Larey Dresser, MD;  Location: [facility];  Service: Cardiovascular;  Laterality: N/A;    Allergies  Allergen Reactions  . Other Itching    Pet Dander    Immunization History  Administered Date(s) Administered  . Influenza Split 06/16/2012  . Influenza Whole 06/09/2010, 06/10/2011  . Influenza,inj,Quad PF,6+ Mos 06/08/2013, 08/10/2015, 05/28/2018, 05/11/2019  . Influenza-Unspecified 06/09/2014    Family History  Problem Relation Age of Onset  . Hypertension Mother   . Heart attack Neg Hx   . Stroke Neg Hx      Current Outpatient Medications:  .  acetaminophen  (TYLENOL) 500 MG tablet, Take 1,000 mg by mouth every 6 (six) hours as needed for mild pain., Disp: , Rfl:  .  albuterol (PROAIR HFA) 108 (90 BASE) MCG/ACT inhaler, Inhale 2 puffs into the lungs every 6 (six) hours as needed for wheezing or shortness of breath., Disp: 8.5 each, Rfl: 0 .  bisoprolol (ZEBETA) 5 MG tablet, Take 1 tablet (5 mg total) by  mouth daily., Disp: 90 tablet, Rfl: 3 .  budesonide-formoterol (SYMBICORT) 80-4.5 MCG/ACT inhaler, Inhale 2 puffs into the lungs 2 (two) times a day., Disp: 2 Inhaler, Rfl: 0 .  cetirizine (ZYRTEC) 10 MG tablet, Take 1 tablet (10 mg total) by mouth daily., Disp: 30 tablet, Rfl: 2 .  furosemide (LASIX) 20 MG tablet, Take 60mg  twice daily till follow up. Take as prescribed on AVS., Disp: 14 tablet, Rfl: 0 .  isosorbide mononitrate (IMDUR) 30 MG 24 hr tablet, Take 1 tablet (30 mg total) by mouth daily. Need appt for future refills [phone], Disp: 30 tablet, Rfl: 11 .  Melatonin 10 MG TABS, Take 10 mg by mouth at bedtime., Disp: , Rfl:  .  Omeprazole (PRILOSEC PO), Take 1 capsule by mouth 2 (two) times daily. , Disp: , Rfl:  .  potassium chloride SA (KLOR-CON) 20 MEQ tablet, Take 1 tablet (20 mEq total) by mouth daily., Disp: 90 tablet, Rfl: 3 .  spironolactone (ALDACTONE) 25 MG tablet, Take 1 tablet (25 mg total) by mouth daily., Disp: 90 tablet, Rfl: 3      Objective:   Vitals:   07/30/19 1019  BP: 120/70  Pulse: 72  SpO2: 95%  Weight: 230 lb 9.6 oz (104.6 kg)  Height: 6\' 4"  (1.93 m)    Estimated body mass index is 28.07 kg/m as calculated from the following:   Height as of this encounter: 6\' 4"  (1.93 m).   Weight as of this encounter: 230 lb 9.6 oz (104.6 kg).  @WEIGHTCHANGE @  [employer]   07/30/19 1019  Weight: 230 lb 9.6 oz (104.6 kg)     Physical Exam  General Appearance:    Alert, cooperative, no distress, appears stated age - yes , Deconditioned looking - no , OBESE  - no, Sitting on Wheelchair -  no  Head:     Normocephalic, without obvious abnormality, atraumatic  Eyes:    PERRL, conjunctiva/corneas clear,  Ears:    Normal TM's and external ear canals, both ears  Nose:   Nares normal, septum midline, mucosa normal, no drainage    or sinus tenderness. OXYGEN ON  - yes . Patient is @ 3L   Throat:   Lips, mucosa, and tongue normal; teeth and gums normal. Cyanosis on lips - no  Neck:   Supple, symmetrical, trachea midline, no adenopathy;    thyroid:  no enlargement/tenderness/nodules; no carotid   bruit or JVD  Back:     Symmetric, no curvature, ROM normal, no CVA tenderness  Lungs:     Distress - ni , Wheeze no, Barrell Chest - no, Purse lip breathing - no, Crackles - no   Chest Wall:    PECTUS.    Heart:    Regular rate and rhythm, S1 and S2 normal, no rub   or gallop, Murmur - no  Breast Exam:    NOT DONE  Abdomen:     Soft, non-tender, bowel sounds active all four quadrants,    no masses, no organomegaly. Visceral obesity - no  Genitalia:   NOT DONE  Rectal:   NOT DONE  Extremities:   Extremities - normal, Has Cane - no, Clubbing - no, Edema - no  Pulses:   2+ and symmetric all extremities  Skin:   Stigmata of Connective Tissue Disease - no  Lymph nodes:   Cervical, supraclavicular, and axillary nodes normal  Psychiatric:  Neurologic:   Pleasant - yes, Anxious - no, Flat affect - no  CAm-ICU - neg,  Alert and Oriented x 3 - yes, Moves all 4s - yes, Speech - normal, Cognition - intact           Assessment:       ICD-10-CM   1. Chronic respiratory failure with hypoxia and hypercapnia (HCC)  J96.11    J96.12   2. Pectus excavatum  Q67.6        Plan:     Patient Instructions     ICD-10-CM   1. Chronic respiratory failure with hypoxia and hypercapnia (HCC)  J96.11    J96.12   2. Pectus excavatum  Q67.6     Stable clinically  Plan  - continue lasix per CHF Service, o2, triology ventilator at night  - best wishes for redo pectus surgery in [place] in a few weeks - discuss  Adderall with PCP [name], PA-C and Dr [name] in [place]  - avoid weight gain\ - check BMET - I will look at CO2 level in venous blood to see if theere is an up creep to 40  Followup  3 months or sooner if needed     SIGNATURE    Dr. Brand Males, M.D., F.C.C.P,  Pulmonary and Critical Care Medicine Staff Physician, [place] Director - Interstitial Lung Disease  Program  Pulmonary [place] at [place], [place], [address]  Pager: [phone], If no answer or between  15:00h - 7:00h: call [phone] Telephone: [phone]  10:48 AM 07/30/2019

## 2019-07-30 NOTE — Patient Instructions (Addendum)
ICD-10-CM   1. Chronic respiratory failure with hypoxia and hypercapnia (HCC)  J96.11    J96.12   2. Pectus excavatum  Q67.6     Stable clinically  Plan  - continue lasix per CHF Service, o2, triology ventilator at night  - best wishes for redo pectus surgery in [place] in a few weeks - discuss Adderall with PCP [name], PA-C and Dr [name] in [place]  - avoid weight gain\ - check BMET - I will look at CO2 level in venous blood to see if theere is an up creep to 40  Followup  3 months or sooner if needed

## 2019-07-30 NOTE — Telephone Encounter (Signed)
CO2 up on venous sample  Plan  - is he using triology properly?  - what setttings? And for how long? - do abg [facility] 07[facility]2019 or Monday 08/02/2019 - let me know  - if gets worse needs to go to ER   Recent Labs  Lab 07/30/19 1054  NA 141  K 3.4*  CL 92*  CO2 46*  GLUCOSE 111*  BUN 14  CREATININE 0.75  CALCIUM 9.0

## 2019-07-30 NOTE — Addendum Note (Signed)
Addended by: Lorretta Harp on: 07/30/2019 10:57 AM   Modules accepted: Orders

## 2019-07-30 NOTE — Addendum Note (Signed)
Addended by: Suzzanne Cloud E on: 07/30/2019 10:54 AM   Modules accepted: Orders

## 2019-08-02 ENCOUNTER — Encounter (HOSPITAL_COMMUNITY): Payer: Self-pay

## 2019-08-02 NOTE — Telephone Encounter (Signed)
Called and spoke with pt letting him know the results of the labwork. Asked pt if he is wearing the trilogy properly and he said that he is. Pt is unsure of the settings but said that it is on whatever settings we set him up with.  Stated to pt that MR wants ABG to be done so we can see what the results of that are in regards to amount of CO2 in bloodstream and pt verbalized understanding. Order placed. Nothing further needed.

## 2019-08-03 ENCOUNTER — Ambulatory Visit (HOSPITAL_COMMUNITY)
Admission: RE | Admit: 2019-08-03 | Discharge: 2019-08-03 | Disposition: A | Discharge status: Home / Self Care | Payer: Managed Care, Other (non HMO) | Source: Ambulatory Visit | Attending: Urology | Admitting: Urology

## 2019-08-03 ENCOUNTER — Other Ambulatory Visit: Payer: Self-pay

## 2019-08-03 DIAGNOSIS — J9612 Chronic respiratory failure with hypercapnia: Secondary | ICD-10-CM | POA: Insufficient documentation

## 2019-08-03 DIAGNOSIS — J9611 Chronic respiratory failure with hypoxia: Secondary | ICD-10-CM

## 2019-08-03 LAB — BLOOD GAS, ARTERIAL
Acid-Base Excess: 12.7 mmol/L — ABNORMAL HIGH (ref 0.0–2.0)
Bicarbonate: 39.5 mmol/L — ABNORMAL HIGH (ref 20.0–28.0)
Drawn by: 21179
FIO2: 32
O2 Saturation: 97.8 %
Patient temperature: 37
pCO2 arterial: 85 mmHg (ref 32.0–48.0)
pH, Arterial: 7.289 — ABNORMAL LOW (ref 7.350–7.450)
pO2, Arterial: 116 mmHg — ABNORMAL HIGH (ref 83.0–108.0)

## 2019-08-03 NOTE — Telephone Encounter (Signed)
   Recent Labs  Lab 08/03/19 1255  PHART 7.289*  PCO2ART 85.0*  PO2ART 116*  HCO3 39.5*  O2SAT 97.8   I got a call from the pulmonary function test lab that his blood gas results are as above.  Patient states he is completely alert.  He says he will have to change his diet.  He says he is not sleepy or wobbly.  He does not want an admission.  He has upcoming surgery for his pectus on August 12, 2019  Plan -Use trilogy ventilator noninvasive at night -Also use it 2  hour in the morning and 2 hour in the afternoon -Hopefully we can avoid an admission here locally -Get to [facility] as soon as possible for the surgery -Any worsening he needs to come to the hospital  I have communicated this with him.  Triage please also call him and let him know

## 2019-08-03 NOTE — Progress Notes (Signed)
ABG done Dr. [name] called and given results of ABG done today.  MD spoke with patient as well about  Increased CO  2 levels.  Pt. was released to go home.

## 2019-08-17 ENCOUNTER — Telehealth (HOSPITAL_COMMUNITY): Payer: Self-pay

## 2019-08-17 NOTE — Telephone Encounter (Signed)
Received a fax requesting Medical Records from [facility]. Faxed records to [phone].

## 2019-08-24 ENCOUNTER — Encounter (HOSPITAL_COMMUNITY): Payer: Self-pay

## 2019-08-25 ENCOUNTER — Encounter (HOSPITAL_COMMUNITY): Payer: Self-pay | Admitting: *Deleted

## 2019-09-15 ENCOUNTER — Encounter (HOSPITAL_COMMUNITY): Payer: Self-pay

## 2019-09-15 NOTE — Telephone Encounter (Signed)
MR- pt wanting to go back to pulmonary rehab and needs order sent to [facility] medical center please advise if this is okay thanks

## 2019-09-17 NOTE — Telephone Encounter (Signed)
Okay to put order for pulmonary rehab at [facility] if he still needs it but it appears that his surgeon already given order so we might be good there  Please give an appointment as soon as possible to see me in January 2021.  If that is too far out for him or you are not able to then please give an appointment with nurse practitioner as soon as possible and based on his convenience  I hope he is doing well after surgery

## 2019-10-06 ENCOUNTER — Encounter (HOSPITAL_COMMUNITY): Payer: Self-pay

## 2019-10-08 ENCOUNTER — Other Ambulatory Visit: Payer: Self-pay

## 2019-10-08 ENCOUNTER — Encounter (HOSPITAL_COMMUNITY): Payer: Self-pay | Admitting: Cardiology

## 2019-10-08 ENCOUNTER — Ambulatory Visit (HOSPITAL_COMMUNITY)
Admission: RE | Admit: 2019-10-08 | Discharge: 2019-10-08 | Disposition: A | Discharge status: Home / Self Care | Payer: BC Managed Care – PPO | Source: Ambulatory Visit | Attending: Cardiology | Admitting: Cardiology

## 2019-10-08 VITALS — BP 118/62 | HR 80 | Wt 229.0 lb

## 2019-10-08 DIAGNOSIS — I110 Hypertensive heart disease with heart failure: Secondary | ICD-10-CM | POA: Diagnosis not present

## 2019-10-08 DIAGNOSIS — I50812 Chronic right heart failure: Secondary | ICD-10-CM | POA: Diagnosis not present

## 2019-10-08 DIAGNOSIS — K7460 Unspecified cirrhosis of liver: Secondary | ICD-10-CM | POA: Insufficient documentation

## 2019-10-08 DIAGNOSIS — Z79899 Other long term (current) drug therapy: Secondary | ICD-10-CM | POA: Diagnosis not present

## 2019-10-08 DIAGNOSIS — I5032 Chronic diastolic (congestive) heart failure: Secondary | ICD-10-CM | POA: Insufficient documentation

## 2019-10-08 DIAGNOSIS — Z8249 Family history of ischemic heart disease and other diseases of the circulatory system: Secondary | ICD-10-CM | POA: Insufficient documentation

## 2019-10-08 DIAGNOSIS — Z87891 Personal history of nicotine dependence: Secondary | ICD-10-CM | POA: Insufficient documentation

## 2019-10-08 DIAGNOSIS — J9612 Chronic respiratory failure with hypercapnia: Secondary | ICD-10-CM | POA: Diagnosis not present

## 2019-10-08 DIAGNOSIS — J45909 Unspecified asthma, uncomplicated: Secondary | ICD-10-CM | POA: Insufficient documentation

## 2019-10-08 DIAGNOSIS — I272 Pulmonary hypertension, unspecified: Secondary | ICD-10-CM | POA: Insufficient documentation

## 2019-10-08 LAB — BASIC METABOLIC PANEL
Anion gap: 13 (ref 5–15)
BUN: 13 mg/dL (ref 6–20)
CO2: 37 mmol/L — ABNORMAL HIGH (ref 22–32)
Calcium: 8.5 mg/dL — ABNORMAL LOW (ref 8.9–10.3)
Chloride: 91 mmol/L — ABNORMAL LOW (ref 98–111)
Creatinine, Ser: 0.71 mg/dL (ref 0.61–1.24)
GFR calc Af Amer: >60 mL/min (ref >60)
GFR calc non Af Amer: >60 mL/min (ref >60)
Glucose, Bld: 121 mg/dL — ABNORMAL HIGH (ref 70–99)
Potassium: 4.2 mmol/L (ref 3.5–5.1)
Sodium: 141 mmol/L (ref 135–145)

## 2019-10-08 LAB — BRAIN NATRIURETIC PEPTIDE: B Natriuretic Peptide: 22.9 pg/mL (ref 0.0–100.0)

## 2019-10-08 NOTE — Patient Instructions (Signed)
Labs today We will only contact you if something comes back abnormal or we need to make some changes. Otherwise no news is good news!  USE Incentive Spirometry  6 times a day  Your physician has requested that you have an echocardiogram. Echocardiography is a painless test that uses sound waves to create images of your heart. It provides your doctor with information about the size and shape of your heart and how well your heart's chambers and valves are working. This procedure takes approximately one hour. There are no restrictions for this procedure.   Your physician recommends that you schedule a follow-up appointment in: 2 months with Dr Aundra Dubin. [facility] code 5009  Please call office at [phone] option 2 if you have any questions or concerns.   At the [facility], you and your health needs are our priority. As part of our continuing mission to provide you with exceptional heart care, we have created designated Provider Care Teams. These Care Teams include your primary Cardiologist (physician) and Advanced Practice Providers (APPs- Physician Assistants and Nurse Practitioners) who all work together to provide you with the care you need, when you need it.   You may see any of the following providers on your designated Care Team at your next follow up: [facility] Dr Glori Bickers . Dr Loralie Champagne . Darrick Grinder, NP . [name], PA . Audry Riles, PharmD   Please be sure to bring in all your medications bottles to every appointment.

## 2019-10-10 NOTE — Progress Notes (Signed)
Patient ID: [name], male   DOB: 25-Dec-1980, 39 y.o.   MRN: [id number] PCP: Dr. Tollie Pizza Cardiology: Dr. Aundra Dubin  39 y.o. with history of pectus excavatum s/p repair in childhood and redo pectus repair in 6/11 returns to cardiology clinic for followup of RV failure. He has chronic hypercarbic and and nocturnal hypoxemic respiratory failure.  He has been wearing Bipap at night.  Oxygen saturation drops with heavy exercise, so he uses oxygen when he will be exercising.  Echo 5/16 showed normal LV EF but did not show the RV well.  I had him get a cardiac MRI in 6/16 for better view of the RV.  This showed moderate to severe RV dilation with mild to moderate RV systolic dysfunction, RV EF 37%.  This looked very similar to the 2010 MRI.   In 8/20, he was admitted to the hospital in [place] with acute hypercarbic respiratory failure.  He was intubated, eventually able to extubate. He was extensively diuresed.  Cardiac MRI showed markedly dilated RV with moderately decreased systolic function, D-shaped septum, and left-ward shift of the heart.  The left atrium was compressed between the ascending and descending aorta.  RHC showed elevated right and left heart filling pressures.  Echo bubble study was negative. Aspiration PNA was also a concern at this admission.    He had redo pectus surgery 08/10/19 in [place] with Dr. [name].    Symptomatically, he seems about the same as before the surgery.  He says that the surgeon told him it would take several more months to see the full effect from surgery. Still short of breath if he does not wear his oxygen.  He is doing pulmonary rehab in [place]. Short of breath with moderate exertion.  No orthopnea/PND.  No chest pain.  No lightheadedness. He is using a Trilogy Bipap with full facemask.  He is wearing oxygen at all times.   Labs (8/10): BNP 9, K 3.6, creatinine 0.7  Labs (2/14): K 3.8, creatinine 0.8, BNP not elevated Labs (2/16): K 3.4,  creatinine 0.81 Labs (6/16): K 4, creatinine 0.73, BNP 12 Labs (11/18): K 4.1, creatinine 0.82, pro-BNP 6, hgb 15.2 Labs (7/20): pro-BNP 62, K 5, creatinine 0.85  Labs (8/20): K 4.6, creatinine 0.69 Labs (9/20): ABG 7.35/59/87  Labs (11/20): K 3.4, creatinine 0.75   Allergies (verified):  No Known Drug Allergies   Past Medical History:  1. Pectus excavatum status post repair in [place] in childhood and repeat repair at [facility] in 6/11. Borderline Marfanoid habitus.  - Pectus redo repair in [place] by Dr. [name] in 12/20.  2. Asthma.  3. CHF: Patient was admitted in 6/10 with CHF and volume overload. He was diuresed with IV lasix and felt better. Echo suggested that the RV was enlarged. RHC was done (after several doses of IV lasix) showing mean RA 13, RV 38/12, PA 42/23 mean 34, mean PCWP 15. PVR < 2 WU. Shunt run showed no O2 step up. Patient was hypoxic and was started O2. Cardiac MRI (6/10) showed normal LV size and systolic function EF [id number], D-shaped IV septum, moderate to severe RV dilation with RV EF 37%. The pulmonic valve opened normally. The heart was shifted leftward (pectus) with compression of the LA between the ascending and descending aorta. MRA of the chest showed normal pulmonary veins. CHF was primarily right-sided with mild pulmonary HTN noted. He is hypoxic, especially at night, so hypoxic pulmonary vasoconstriction could be playing a role. PCWP was only  mildly elevated but I wonder if LA compression could be playing a role. Repeat echo after pectus surgery in 11/11 showed normal LV systolic function and size with a D-shaped interventricular septum. The LA was still compressed between ascending and descending aorta. The RV was moderately dilated with mild systolic dysfunction.  Echo (5/16) with EF 5-60%, aortic root 4.0 cm, RV poorly vsiualized.  Cardiac MRI (6/16) with pectus excavatum, left-shifted heart, LA compressed between ascending and descending aorta, LV EF 58% with  D-shaped interventricular septum suggestive of RV pressure/volume overload, moderate to severe RV dilation with mild to moderate RV systolic dysfunction (RV EF 37%), dilated PA (this MRI is very similar to prior in 2010).  - RHC (8/20): mean RA 11, PA 51/30 mean 41, mean PCWP 18, PAPI 1.9, CVP/PCWP 0.61 - Limited echo for bubble study (8/20): Negative bubble study.  - Cardiac MRI (8/20): The left lung appears collapsed, compressed by leftwards shift of the heart and dilated RV.  Dilated main pulmonary artery. Normal LV size with mild diffuse hypokinesis, EF 52%. Markedly dilated RV with EF 31%, moderate hypokinesis. D-shaped interventricular septum, suggestive of RV pressure/volume overload. Small left atrium appears compressed between the aortic root/ascending aorta and the descending aorta/ventrebral column. Nonspecific LGE pattern at the RV insertion sites, suggestive of RV pressure/volume overload. 4. 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  - High resolution CT chest (8/19): Bilateral pleuroparenchymal scarring, cirrhosis noted. No ILD.  - CT chest (8/20): Severe pectus deformity, heart shifted left with extensive left lung compressive atelectasis.  5. 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 Patient is on Bipap.  6. Cirrhosis: Noted on RUQ [place] in 8/20.  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  -  MRI liver in 10/20 showed hepatic hemangioma and lobulated liver contours concerning for cardiogenic hepatic congestion, no frank cirrhosis.  Family History:  HTN-Mother   Social History:  The patient lives in [place] with his wife and son.  Patient currently out of work (prior in [employer]) => working towards disability.  He drinks 1-6 drinks per week, never having more than 3 drinks in 1 evening. No illicit drug use.  Patient states former smoker. Started at age 82. only smoked socially. Quit smoking at age 65.   Review of Systems  All  systems reviewed and negative except as per HPI.  Current Outpatient Medications  Medication Sig Dispense Refill  . acetaminophen (TYLENOL) 500 MG tablet Take 1,000 mg by mouth every 6 (six) hours as needed for mild pain.    Marland Kitchen albuterol (PROAIR HFA) 108 (90 BASE) MCG/ACT inhaler Inhale 2 puffs into the lungs every 6 (six) hours as needed for wheezing or shortness of breath. 8.5 each 0  . bisoprolol (ZEBETA) 5 MG tablet Take 1 tablet (5 mg total) by mouth daily. 90 tablet 3  . cetirizine (ZYRTEC) 10 MG tablet Take 1 tablet (10 mg total) by mouth daily. 30 tablet 2  . furosemide (LASIX) 20 MG tablet Take 60mg  twice daily till follow up. Take as prescribed on AVS. 14 tablet 0  . HYDROcodone-acetaminophen (NORCO/VICODIN) 5-325 MG tablet Take 1 tablet by mouth every 8 (eight) hours.    Marland Kitchen ibuprofen (ADVIL) 400 MG tablet Take 400 mg by mouth every 6 (six) hours as needed.    . isosorbide mononitrate (IMDUR) 30 MG 24 hr tablet Take 1 tablet (30 mg total) by mouth daily. Need appt for future refills [phone] 30 tablet 11  . Melatonin 10 MG  TABS Take 10 mg by mouth at bedtime.    . Omeprazole (PRILOSEC PO) Take 1 capsule by mouth 2 (two) times daily.     . potassium chloride SA (KLOR-CON) 20 MEQ tablet Take 1 tablet (20 mEq total) by mouth daily. 90 tablet 3  . spironolactone (ALDACTONE) 25 MG tablet Take 1 tablet (25 mg total) by mouth daily. 90 tablet 3   No current facility-administered medications for this encounter.    BP 118/62   Pulse 80   Wt 103.9 kg (229 lb)   SpO2 96%   BMI 27.87 kg/m  General: NAD Neck: No JVD, no thyromegaly or thyroid nodule.  Lungs: Clear to auscultation bilaterally with normal respiratory effort. CV: Nondisplaced PMI.  Heart regular S1/S2, no S3/S4, no murmur.  No peripheral edema.  No carotid bruit.  Normal pedal pulses.  Abdomen: Soft, nontender, no hepatosplenomegaly, no distention.  Skin: Intact without lesions or rashes.  Neurologic: Alert and oriented x  3.  Psych: Normal affect. Extremities: No clubbing or cyanosis.  HEENT: Normal.   Assessment/Plan: 1. Chronic hypercarbic respiratory failure: Last CTA chest showed collapsed left lung with compression from dilated RV, no PE, RLL infiltrate (aspiration versus PNA).  This appears to be primarily a mechanical problem with severe pectus and leftward shift of the heart with compression of the left lung.  He has now had a redo pectus repair (12/20) in [place] by Dr. [name].  Still requiring about the same oxygen.  - He is now doing pulmonary rehab in [place].   - I encouraged him to use incentive spirometry.  - To followup with Dr. Sabra Heck in [place] in several months and will have CT chest.  2.Chronic diastolic CHF with RV failure: Patient presented with primarily right-sided CHF in 2010, likely due to pulmonary hypertension from a combination of left atrial compression and nocturnal hypoxemia. He is now using Bipap every night. He had a redo pectus surgery in 2011. I suspect the surgery relieved some of the compression on his lungs but followup cardiac MRI in 6/16 did show continued compression of the left atrium between the ascending and descending aorta as well as a dilated RV with RV EF 37%. Cardiac MRI in 6/16 was very similar to the MRI in 2010. RHC in 8/20 showed elevated right and left heart filling pressures with primarily pulmonary venous hypertension.  PAPI low but not markedly low, suggesting mild-moderate RV dysfunction. The MRI repeated in 8/20 showed left-shifted heart with marked RV enlargement and RVEF 31%, LV EF 52%.  The LA is compressed between ascending and descending aorta, which may explain elevated PCWP.  He does not look volume overloaded on exam. NYHA class II symptoms as long as he wears oxygen.  - Continue Lasix 40 mg po bid, BMET/BNP today.  - Continue spironolactone 25 mg daily and KCL 20 mEq daily.  -Continue Bipap at night and oxygen during the day to lower drive  for hypoxemic pulmonary vasoconstriction. - I will arrange for echo at followup in 2 months to reassess RV and PA pressure.  2. 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 RHC in 8/20 moderate pulmonary hypertension, PA pressure 51/30. Significant component of pulmonary venous hypertension.  There is unlikely to be a group 1 component to his PH, so not sure pulmonary vasodilators would be helpful.  3. Restrictive lung disease:  Lung disease from restriction due to pectus. He has had chronic hypercarbic respiratory failure. This is a major player in his chronic dyspnea. He has now had 3 operations for his pectus deformity. CT in 8/20 showed heart shifted left with compression of left lung. Most recent operation in [place] in 12/20 by Dr. [name].   4. Asthma:Chronic. 5. Cirrhosis: Abdominal US was concerning for cirrhosis.  He is not a heavy drinker.  My suspicion is that this may be related to RV failure.  - MRI liver in 10/20 showed hepatic hemangioma and lobulated liver contours concerning for cardiogenic hepatic congestion, no frank cirrhosis.   Followup in 2 months with echo.     Loralie Champagne 10/10/2019

## 2019-10-14 ENCOUNTER — Ambulatory Visit (INDEPENDENT_AMBULATORY_CARE_PROVIDER_SITE_OTHER): Payer: BC Managed Care – PPO | Admitting: Internal Medicine

## 2019-10-14 ENCOUNTER — Encounter: Payer: Self-pay | Admitting: Internal Medicine

## 2019-10-14 ENCOUNTER — Other Ambulatory Visit: Payer: Self-pay

## 2019-10-14 VITALS — BP 120/66 | HR 67 | Temp 97.1°F | Ht 76.0 in | Wt 229.4 lb

## 2019-10-14 DIAGNOSIS — J9612 Chronic respiratory failure with hypercapnia: Secondary | ICD-10-CM | POA: Diagnosis not present

## 2019-10-14 DIAGNOSIS — Z0271 Encounter for disability determination: Secondary | ICD-10-CM

## 2019-10-14 DIAGNOSIS — Q676 Pectus excavatum: Secondary | ICD-10-CM | POA: Diagnosis not present

## 2019-10-14 DIAGNOSIS — J9611 Chronic respiratory failure with hypoxia: Secondary | ICD-10-CM

## 2019-10-14 NOTE — Progress Notes (Signed)
OV 05/25/2019  Subjective:  Patient ID: [name], male , DOB: 1981-01-05 , age 39 y.o. , MRN: [id number] , ADDRESS: [place] [address]   05/25/2019 -   Chief Complaint  Patient presents with  . Restrictive Lung Disease    Feels a little better since last visit.     ICD-10-CM   1. Chronic respiratory failure with hypoxia and hypercapnia (HCC)  J96.11    J96.12   2. Pectus excavatum  Q67.6      HPI [name] 39 y.o. -presents for follow-up of the above issues.  He was hospitalized approximately just over a month ago with significant acute on chronic hypercapnic and hypoxemic respiratory failure.  PCO2 was greater than 100 at that time.  He ultimately got discharged.  In between he saw my nurse practitioner and his PCO2 had come down to 59.  He is on trilogy ventilator at night along with 3 L of oxygen at night.  He is also on 1-2 L of oxygen in the daytime.  He has quit working.  He might be applying for disability him not so sure.  Is up-to-date with his vaccination status.  We have been looking at connecting him with a pectus excavatum redo surgeon.  A month ago I spoke to the surgeon Dr. Hinton Lovely at [facility].  She does not do anymore redo surgeries.  She referred [place] to Dr. Linna Hoff in [place].  Nurse practitioner has made a referral but neither as an outpatient have heard from him.  We also sent his CD-ROM of the CT scan to Dr. Threasa Alpha at [facility] in [place].  Waiting to hear.  Patient is interested in a referral to the [place] clinic because his family in the [place] area.  I have contacted Dr. West Carbo at the [facility] clinic to inquire about potential surgeon      OV 07/30/2019  Subjective:  Patient ID: [name], male , DOB: 08-01-1981 , age 39 y.o. , MRN: [id number] , ADDRESS: [place] [address]   07/30/2019 -   Chief Complaint  Patient presents with  . Follow-up    Pt states he still has  problems with his breathing. Denies any problems with coughing. Pt is on oxygen and goes between 2-3L   Follow-up chronic hypoxemic and hypercapnic respiratory failure from severe pectus excavatum  HPI [name] 39 y.o. -last seen by myself mid September 2020.  In the interim he was able to establish contact with Dr. [name] surgeon in [place].  He saw them probably this month 2020.  He says he is being scheduled for surgery early December 2020.  He says he was extremely pleased with the surgeon's visit.  Redo surgery by placing bars and opening up 6 inches of space inside his thoracic cavity has been discussed.  He says he does not want to see another institution like [facility] in [place] or [facility].  He is hopeful that he will have a good outcome after the surgery.  He says the surgery might be delayed because of the COVID-19 pandemic.  He is frustrated by this.  Overall he feels his health is stable.  He has gained some weight after stopping the Adderall.  His Lasix has been increased recently and this is helped his dyspnea.  He is on 3 L of oxygen and Trilegy ventilator at night.  He does  not feel he is hypercapnic.  He does not think he needs a blood gas.  But his symptom scores are worse compared to September 2020.   Venous CO2 was suggest that when the CO2 in the venous system is over 48 correlates with hypercapnia.  Currently it is 61 and still is a bit higher than before but it could also reflect metabolic alkalosis from Lasix.         Results for CHADWICK, KOSTRZEWSKI (MRN [id number]) as of 05/25/2019 09:48  Ref. Range 04/25/2019 07:19 04/26/2019 05:26 04/26/2019 08:05 04/27/2019 08:35 05/14/2019 14:54  pH, Arterial Latest Ref Range: 7.350 - 7.450  7.193 (LL) 7.181 (LL) 7.231 (L) 7.316 (L) 7.351  pCO2 arterial Latest Ref Range: 32.0 - 48.0 mmHg 110 (HH) 119 (HH) 103 (HH) 88.1 (HH) 59.1 (H)  pO2, Arterial Latest Ref Range: 83.0 - 108.0 mmHg 74.2 (L) 85.7 91.4  95.3 86.8   Results for BILL, WIED (MRN [id number]) as of 07/30/2019 10:38  Ref. Range 06/14/2019 15:44 06/24/2019 10:51 06/28/2019 09:51 07/22/2019 11:43 07/22/2019 12:26  CO2 Latest Ref Range: 22 - 32 mmol/L 35 (H) 35 (H)   37 (H)    OV 10/14/2019  Subjective:  Patient ID: [name], male , DOB: Jul 12, 1981 , age 3 y.o. , MRN: [id number] , ADDRESS: [place] [address]   10/14/2019 -  follolwup pectus and chronic hypoxemic hypercapnic respiratory failure  HPI [name] 39 y.o. -is a follow-up visit after undergoing redo pectus repair surgery in [place].  He says he had PLA bars and increased chest volume surgery was done with a redo repair.  He showed me his fresh scar.  It is healing well.  He had a video visit with the surgeon who is somewhat optimistic according to his history that he might be weaned off of oxygen and several months.  However at this point in time 1-2  month after the surgery he feels no subjective improvement.  He still on oxygen using trilogy ventilator.  He says his ECOG is now 3.  His dyspnea is class III.  He is not able to work.  He is asking for permanent disability application.  I agree with this.  He has not had a Covid vaccine asked him to apply for this through the state.  He had a chest x-ray done at [facility] in January 2021: I only reviewed the report.  He had an access to the image on his cell phone and he showed me the image which I visualized and agree with the formal interpretation and my own interpretation as well basically there is no change he is.  He might have increased chest volume.  There is generalized congestion.  Reviewed and interpreted recent blood work October 08, 2019: Creatinine 0.7 mg percent.  Carbon dioxide 37 which appears to be his baseline  His baseline blood gas shows CO2 between 60 and 88.    His most recent BNP was 23.  SYMPTOM SCALE - 05/25/2019  07/30/2019   O2 use  2-3L at  rest and 3-4 with exertion and TRiology qhs  Shortness of Breath 0 -> 5 scale with 5 being worst (score 6 If unable to do)   At rest 0 2  Simple tasks - showers, clothes change, eating, shaving 0 3  Household (dishes, doing bed, laundry) 3 4  Shopping 1 4  Walking level at own pace 2 3  Walking keeping up with  others of same age 25 4  Walking up Stairs 4 5  Walking up [place] 4 5  Total (40 - 48) Dyspnea Score 17 30  How bad is your cough? 0 n/a  How bad is your fatigue 0 3    ROS - per HPI     has a past medical history of Asthma, CHF (congestive heart failure) ([place]), Chronic hypercapnic respiratory failure ([place]), Pectus excavatum, and Restrictive lung disease.   reports that he quit smoking about 13 years ago. His smoking use included cigarettes. He has a 0.10 pack-year smoking history. He has never used smokeless tobacco.  Past Surgical History:  Procedure Laterality Date  . KNEE SURGERY     right  . PECTUS EXCAVATUM REPAIR    . RIGHT HEART CATH N/A 04/21/2019   Procedure: RIGHT HEART CATH;  Surgeon: Larey Dresser, MD;  Location: [facility];  Service: Cardiovascular;  Laterality: N/A;    Allergies  Allergen Reactions  . Other Itching    Pet Dander    Immunization History  Administered Date(s) Administered  . Influenza Split 06/16/2012  . Influenza Whole 06/09/2010, 06/10/2011  . Influenza,inj,Quad PF,6+ Mos 06/08/2013, 08/10/2015, 05/28/2018, 05/11/2019  . Influenza-Unspecified 06/09/2014    Family History  Problem Relation Age of Onset  . Hypertension Mother   . Heart attack Neg Hx   . Stroke Neg Hx      Current Outpatient Medications:  .  acetaminophen (TYLENOL) 500 MG tablet, Take 1,000 mg by mouth every 6 (six) hours as needed for mild pain., Disp: , Rfl:  .  albuterol (PROAIR HFA) 108 (90 BASE) MCG/ACT inhaler, Inhale 2 puffs into the lungs every 6 (six) hours as needed for wheezing or shortness of breath., Disp: 8.5 each, Rfl: 0 .  bisoprolol  (ZEBETA) 5 MG tablet, Take 1 tablet (5 mg total) by mouth daily., Disp: 90 tablet, Rfl: 3 .  cetirizine (ZYRTEC) 10 MG tablet, Take 1 tablet (10 mg total) by mouth daily., Disp: 30 tablet, Rfl: 2 .  furosemide (LASIX) 20 MG tablet, Take 60mg  twice daily till follow up. Take as prescribed on AVS., Disp: 14 tablet, Rfl: 0 .  HYDROcodone-acetaminophen (NORCO/VICODIN) 5-325 MG tablet, Take 1 tablet by mouth every 8 (eight) hours., Disp: , Rfl:  .  ibuprofen (ADVIL) 400 MG tablet, Take 400 mg by mouth every 6 (six) hours as needed., Disp: , Rfl:  .  isosorbide mononitrate (IMDUR) 30 MG 24 hr tablet, Take 1 tablet (30 mg total) by mouth daily. Need appt for future refills [phone], Disp: 30 tablet, Rfl: 11 .  Melatonin 10 MG TABS, Take 10 mg by mouth at bedtime., Disp: , Rfl:  .  Omeprazole (PRILOSEC PO), Take 1 capsule by mouth 2 (two) times daily. , Disp: , Rfl:  .  potassium chloride SA (KLOR-CON) 20 MEQ tablet, Take 1 tablet (20 mEq total) by mouth daily., Disp: 90 tablet, Rfl: 3 .  spironolactone (ALDACTONE) 25 MG tablet, Take 1 tablet (25 mg total) by mouth daily., Disp: 90 tablet, Rfl: 3      Objective:   Vitals:   10/14/19 0949  BP: 120/66  Pulse: 67  Temp: (!) 97.1 F (36.2 C)  TempSrc: Temporal  SpO2: 97%  Weight: 229 lb 6.4 oz (104.1 kg)  Height: 6\' 4"  (1.93 m)    Estimated body mass index is 27.92 kg/m as calculated from the following:   Height as of this encounter: 6\' 4"  (1.93 m).   Weight as  of this encounter: 229 lb 6.4 oz (104.1 kg).  @WEIGHTCHANGE @  [employer]   10/14/19 0949  Weight: 229 lb 6.4 oz (104.1 kg)     Physical Exam  Tall male with pectus.  Fresh scar that looks healthy.  No edema.  Overall air entry diminished but may be better than before.  Alert and oriented x3.  Is wearing a mask is on 3 L oxygen.         Assessment:       ICD-10-CM   1. Chronic respiratory failure with hypoxia and hypercapnia (HCC)  J96.11 Blood Gas, Arterial    J96.12   2. Pectus excavatum  Q67.6   3. Disability examination  Z02.71        Plan:     Patient Instructions     ICD-10-CM   1. Chronic respiratory failure with hypoxia and hypercapnia (HCC)  J96.11    J96.12   2. Pectus excavatum  Q67.6   3. Disability examination  Z02.71    It appears that the surgery from [facility] for a pectus repair redo has not helped so far at least in terms of his symptoms  I think a permanently disabled [new issue]  Plan  -Check arterial blood gas to see if there is any improvement in your carbon dioxide levels  -Do this anytime in the next few weeks -Continue oxygen and pulmonary rehabilitation and nighttime trilogy ventilator -Continue surgical follow-up with [facility] surgeon -Continue cardiac follow-up with Dr. Haroldine Laws as needed -Agree and support with disability application   Follow-up -We will call with arterial blood gas results -Return in 3 months for routine follow-up    SIGNATURE    Dr. Brand Males, M.D., F.C.C.P,  Pulmonary and Critical Care Medicine Staff Physician, [facility] Director - Interstitial Lung Disease  Program  Pulmonary [place] at [place], [place], [address]  Pager: [phone], If no answer or between  15:00h - 7:00h: call [phone] Telephone: [phone]  10:13 AM 10/14/2019

## 2019-10-14 NOTE — Patient Instructions (Addendum)
ICD-10-CM   1. Chronic respiratory failure with hypoxia and hypercapnia (HCC)  J96.11    J96.12   2. Pectus excavatum  Q67.6   3. Disability examination  Z02.71    It appears that the surgery from [facility] for a pectus repair redo has not helped so far at least in terms of his symptoms  I think a permanently disabled [new issue]  Plan  -Check arterial blood gas to see if there is any improvement in your carbon dioxide levels  -Do this anytime in the next few weeks -Continue oxygen and pulmonary rehabilitation and nighttime trilogy ventilator -Continue surgical follow-up with [facility] surgeon -Continue cardiac follow-up with Dr. Haroldine Laws as needed -Agree and support with disability application   Follow-up -We will call with arterial blood gas results -Return in 3 months for routine follow-up

## 2019-10-21 ENCOUNTER — Other Ambulatory Visit (HOSPITAL_COMMUNITY)
Admission: RE | Admit: 2019-10-21 | Payer: BC Managed Care – PPO | Source: Other Acute Inpatient Hospital | Admitting: Internal Medicine

## 2019-10-21 ENCOUNTER — Other Ambulatory Visit: Payer: Self-pay

## 2019-10-21 DIAGNOSIS — J9611 Chronic respiratory failure with hypoxia: Secondary | ICD-10-CM

## 2019-10-21 DIAGNOSIS — J9612 Chronic respiratory failure with hypercapnia: Secondary | ICD-10-CM

## 2019-10-22 ENCOUNTER — Encounter (HOSPITAL_COMMUNITY): Payer: Managed Care, Other (non HMO) | Admitting: Cardiology

## 2019-10-22 NOTE — Telephone Encounter (Signed)
Katie - scheduler in Respiratory Dept - called & left me vm.  She has pt scheduled for Monday at [place] states everything is taken care of.

## 2019-10-22 NOTE — Telephone Encounter (Addendum)
ABG order placed on 11/24 was done on 11/25.  There was an order placed on 10/14/19 for ABG to be done at [facility] and that order was canceled.  They don't do them there.  I have called over to Respiratory Dept & left vm for scheduler to call me back.  I think since order placed on 2/4 was canceled a new order will be needed.  To expedite things you may want to go ahead and place new order and that way I can call respiratory and leave a vm and ask them to just call the pt and schedule and they won't have to call us back and request new order if needed.

## 2019-10-22 NOTE — Telephone Encounter (Signed)
They are doing ABG's at [facility] now.  I checked and there isn't an order on file for him.  Looks like he went to lab on [place] yesterday but nothing done?  Will need ABG order and we can call & schedule for him.  Please put note in order pt would like done today and we will see if can be done.

## 2019-10-22 NOTE — Telephone Encounter (Signed)
mychart message which is shown below was sent by pt:   To: [facility]    From: [name]    Created: 10/21/2019 10:03 PM     *-*-*This message was handled on 10/22/2019 11:05 AM by Zina Pitzer P*-*-*  I tried to get my blood gas done Thursday and I went to the wrong place. I want to go get it done today can you let me know where to go and send in an order to get it done. I think they may have goofed up your initial order yesterday.    Please message me or call me in the morning to let me know where to go.   Thank you,  [name] [phone]      It looks like pt might have gone to [facility] lab yesterday 2/11 to try to get this done for some reason. PCCs, are ABGs even being done at this time over at the hospitals? If so, can you help [place] to get this scheduled for pt please?

## 2019-10-22 NOTE — Telephone Encounter (Signed)
ABG order has been placed.

## 2019-10-22 NOTE — Telephone Encounter (Signed)
Order showing 11/24 under procedure tab in the PFT order which has ABG selected to be done on pt's O2. Does this need to be fixed?

## 2019-10-25 ENCOUNTER — Ambulatory Visit (HOSPITAL_COMMUNITY)
Admission: RE | Admit: 2019-10-25 | Discharge: 2019-10-25 | Disposition: A | Discharge status: Home / Self Care | Payer: BC Managed Care – PPO | Source: Ambulatory Visit | Attending: Internal Medicine | Admitting: Internal Medicine

## 2019-10-25 ENCOUNTER — Other Ambulatory Visit: Payer: Self-pay

## 2019-10-25 DIAGNOSIS — J9611 Chronic respiratory failure with hypoxia: Secondary | ICD-10-CM | POA: Diagnosis not present

## 2019-10-25 DIAGNOSIS — J9612 Chronic respiratory failure with hypercapnia: Secondary | ICD-10-CM | POA: Diagnosis not present

## 2019-10-25 LAB — BLOOD GAS, ARTERIAL
Acid-Base Excess: 14.9 mmol/L — ABNORMAL HIGH (ref 0.0–2.0)
Bicarbonate: 41.4 mmol/L — ABNORMAL HIGH (ref 20.0–28.0)
Drawn by: 53547
FIO2: 28
O2 Saturation: 94.2 %
Patient temperature: 37
pCO2 arterial: 81.2 mmHg (ref 32.0–48.0)
pH, Arterial: 7.328 — ABNORMAL LOW (ref 7.350–7.450)
pO2, Arterial: 76.3 mmHg — ABNORMAL LOW (ref 83.0–108.0)

## 2019-11-10 IMAGING — DX CHEST - 2 VIEW
2 series · 2 of 2 positions shown · non-contrast
Comparison: 03/22/2019

CLINICAL DATA: Congestive failure

EXAM:
CHEST - 2 VIEW

[chest pa]
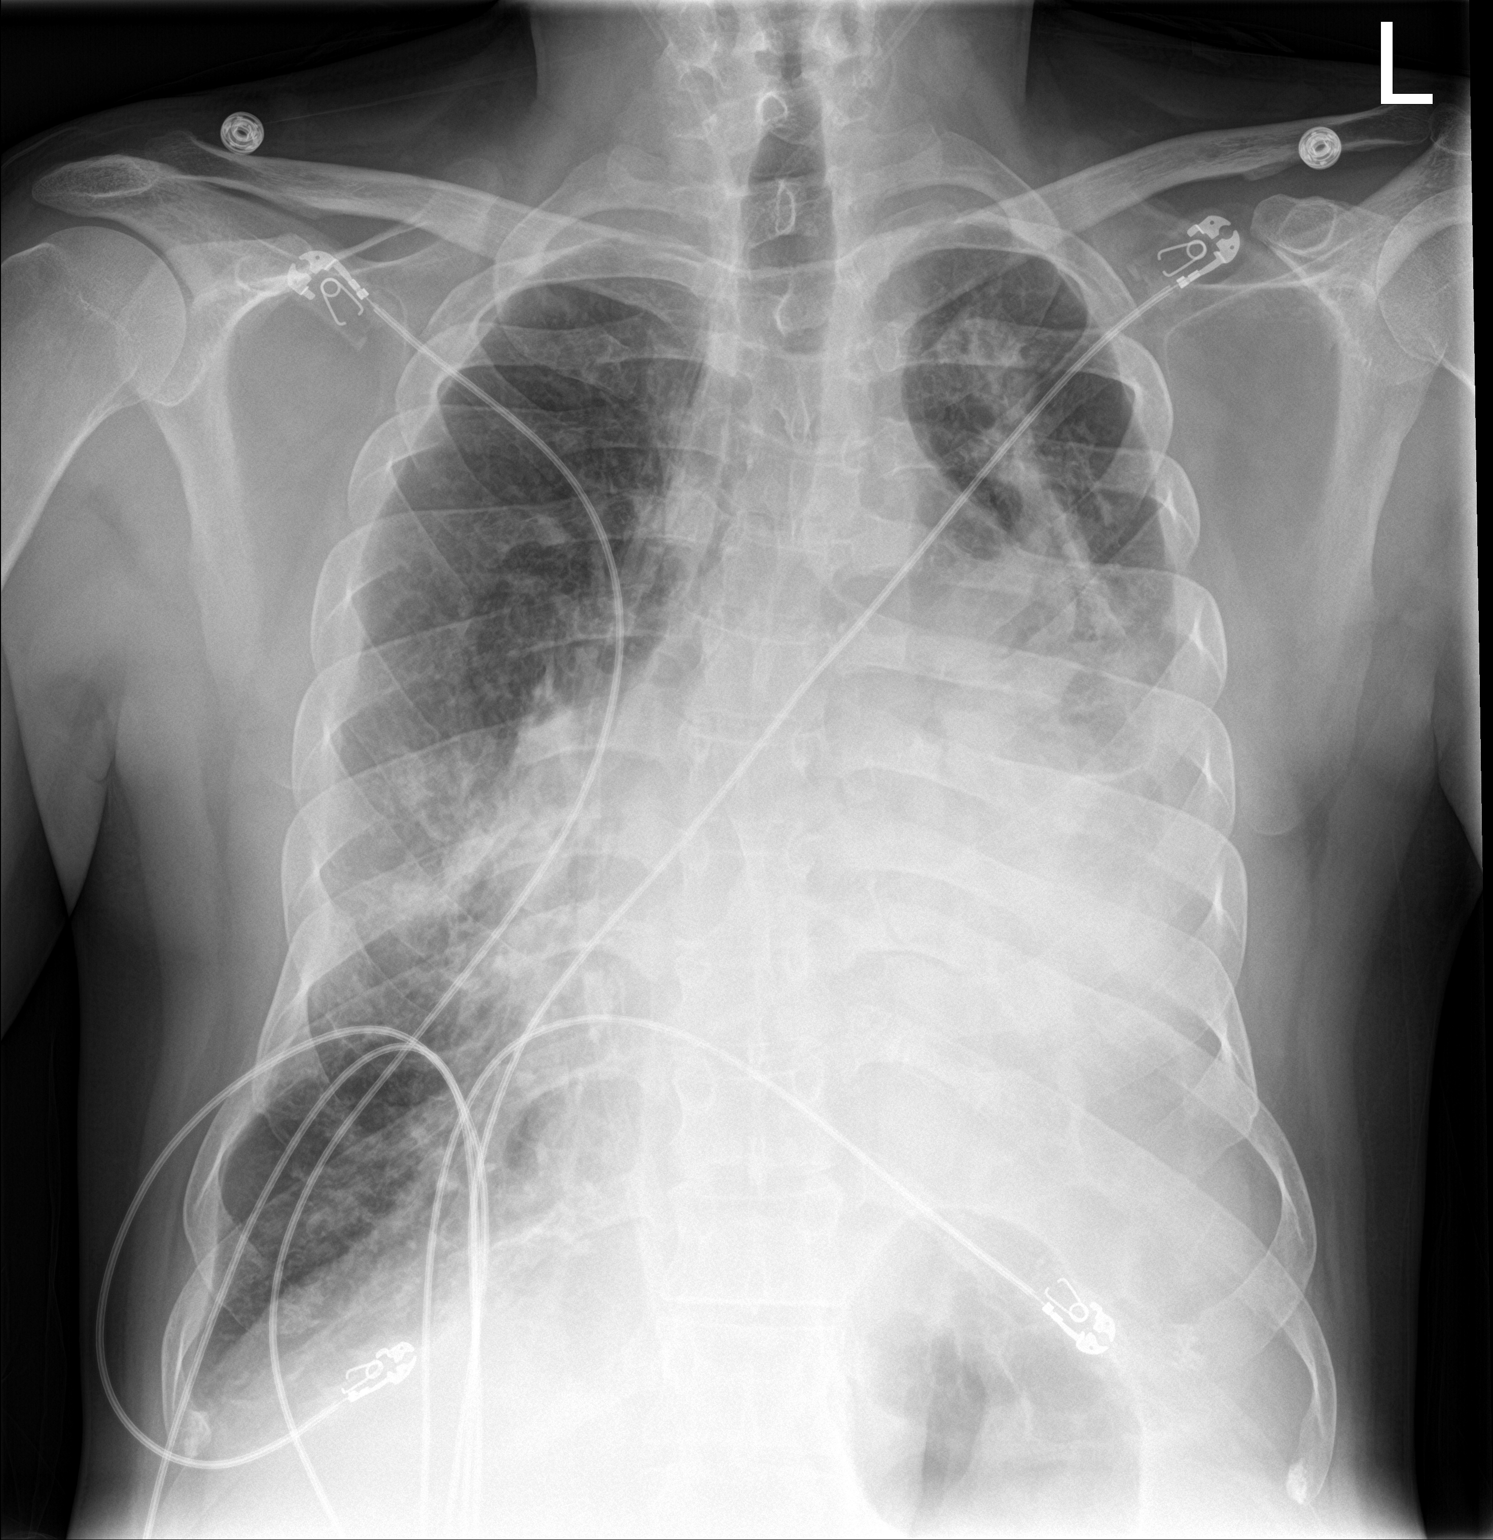

[chest lat]
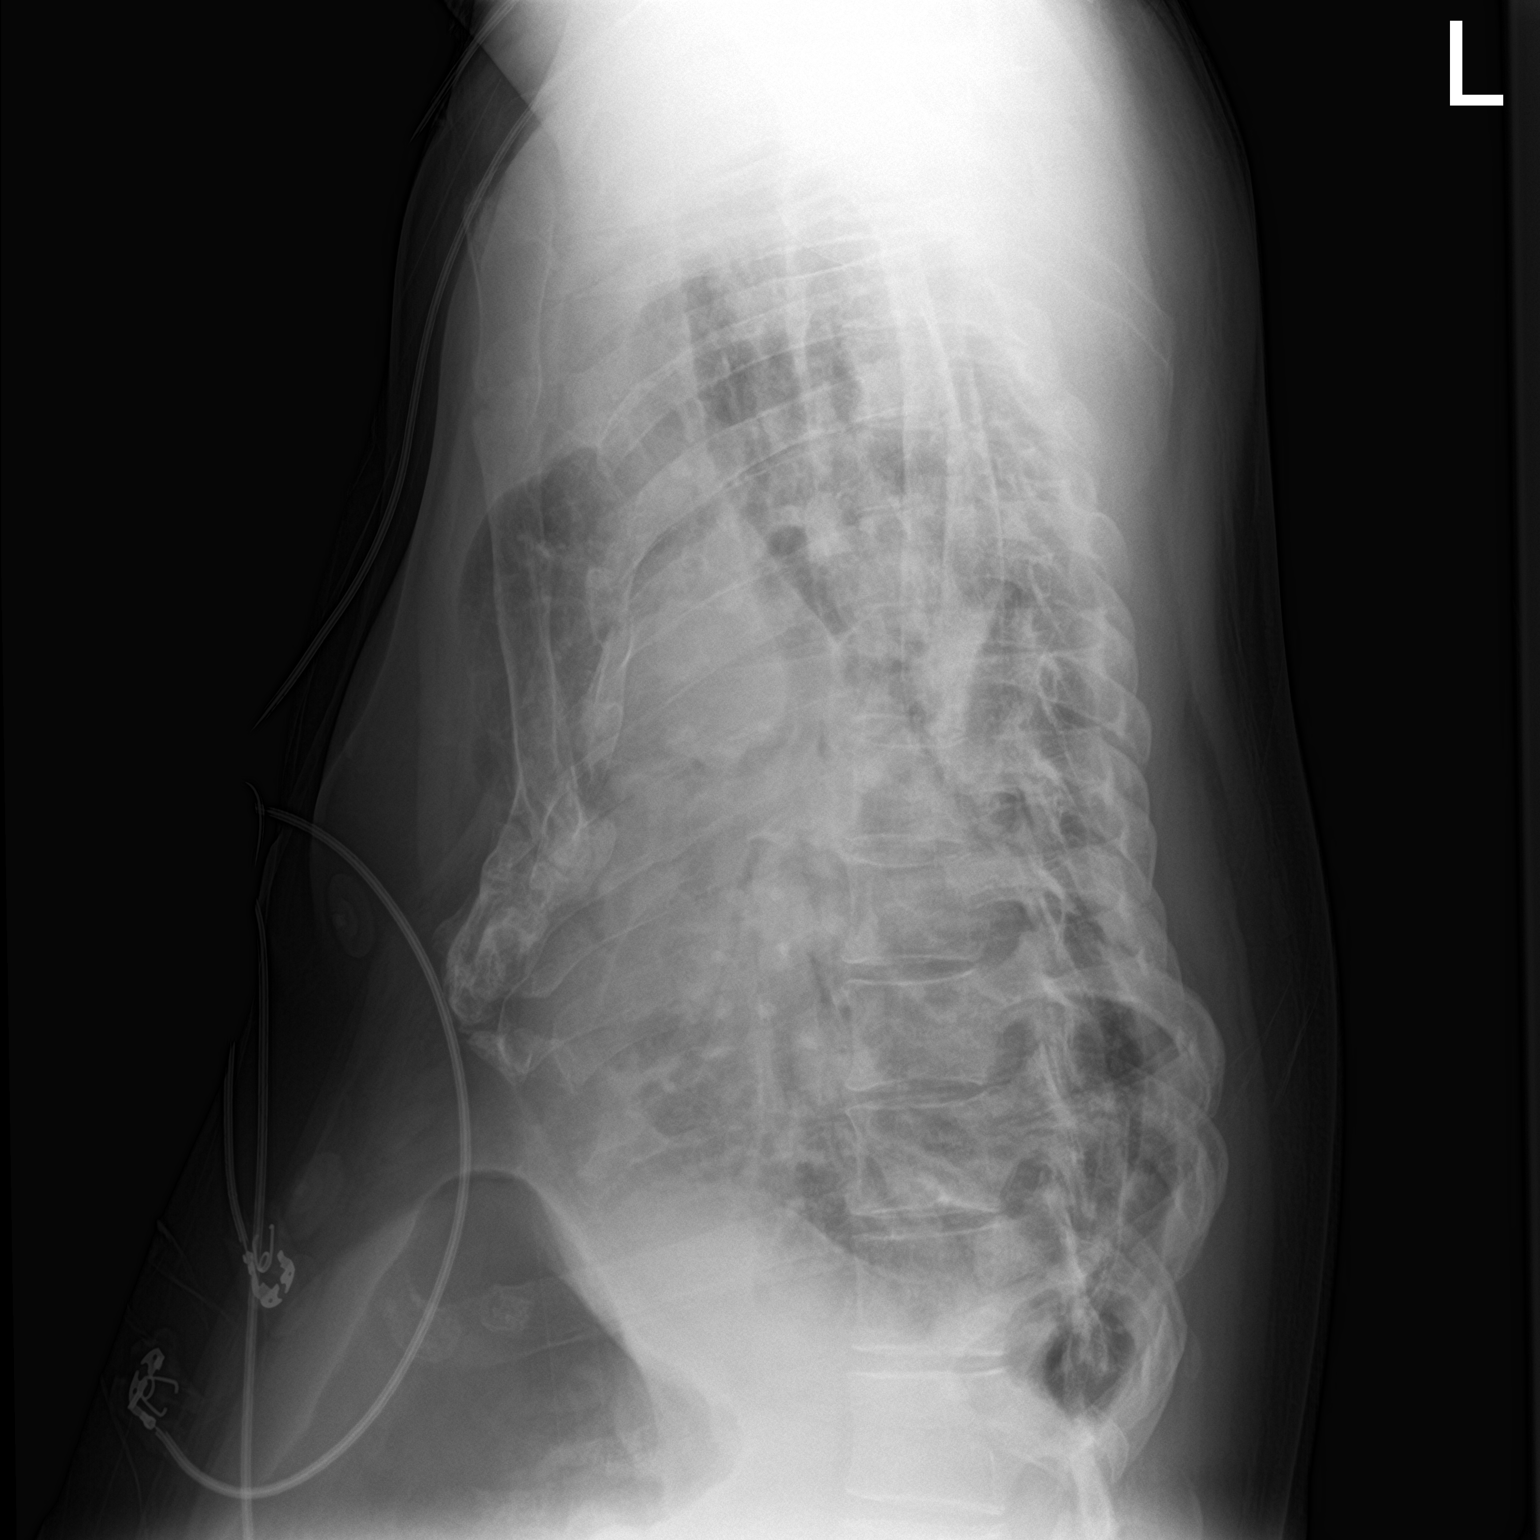

[2 of 2 positions shown; findings below may reference images not displayed]

FINDINGS: Cardiac shadow is stable. Patchy infiltrates are again identified
bilaterally worse on the left than the right but stable from the
prior study. These are increased however from a prior exam from
5673. No new focal abnormality is noted.
IMPRESSION: Stable appearance of the chest when compare with the prior exam.
Given the stability this is likely of a more chronic scarring
appearance. Correlation with CT of the chest may be helpful.

## 2019-12-14 ENCOUNTER — Ambulatory Visit (HOSPITAL_BASED_OUTPATIENT_CLINIC_OR_DEPARTMENT_OTHER)
Admission: RE | Admit: 2019-12-14 | Discharge: 2019-12-14 | Disposition: A | Discharge status: Home / Self Care | Payer: BC Managed Care – PPO | Source: Ambulatory Visit | Attending: Cardiology | Admitting: Cardiology

## 2019-12-14 ENCOUNTER — Ambulatory Visit (HOSPITAL_COMMUNITY)
Admission: RE | Admit: 2019-12-14 | Discharge: 2019-12-14 | Disposition: A | Discharge status: Home / Self Care | Payer: BC Managed Care – PPO | Source: Ambulatory Visit | Attending: Cardiology | Admitting: Cardiology

## 2019-12-14 ENCOUNTER — Other Ambulatory Visit: Payer: Self-pay

## 2019-12-14 ENCOUNTER — Encounter (HOSPITAL_COMMUNITY): Payer: Self-pay | Admitting: Cardiology

## 2019-12-14 DIAGNOSIS — I5032 Chronic diastolic (congestive) heart failure: Secondary | ICD-10-CM | POA: Diagnosis present

## 2019-12-14 DIAGNOSIS — Z87891 Personal history of nicotine dependence: Secondary | ICD-10-CM | POA: Diagnosis not present

## 2019-12-14 DIAGNOSIS — Z79899 Other long term (current) drug therapy: Secondary | ICD-10-CM | POA: Diagnosis not present

## 2019-12-14 DIAGNOSIS — I50812 Chronic right heart failure: Secondary | ICD-10-CM

## 2019-12-14 DIAGNOSIS — I110 Hypertensive heart disease with heart failure: Secondary | ICD-10-CM | POA: Insufficient documentation

## 2019-12-14 DIAGNOSIS — J45909 Unspecified asthma, uncomplicated: Secondary | ICD-10-CM | POA: Insufficient documentation

## 2019-12-14 DIAGNOSIS — Z8249 Family history of ischemic heart disease and other diseases of the circulatory system: Secondary | ICD-10-CM | POA: Diagnosis not present

## 2019-12-14 DIAGNOSIS — K7460 Unspecified cirrhosis of liver: Secondary | ICD-10-CM | POA: Insufficient documentation

## 2019-12-14 DIAGNOSIS — I272 Pulmonary hypertension, unspecified: Secondary | ICD-10-CM | POA: Insufficient documentation

## 2019-12-14 DIAGNOSIS — J9611 Chronic respiratory failure with hypoxia: Secondary | ICD-10-CM | POA: Diagnosis not present

## 2019-12-14 DIAGNOSIS — J9612 Chronic respiratory failure with hypercapnia: Secondary | ICD-10-CM | POA: Diagnosis not present

## 2019-12-14 LAB — BASIC METABOLIC PANEL
Anion gap: 7 (ref 5–15)
BUN: 12 mg/dL (ref 6–20)
CO2: 43 mmol/L — ABNORMAL HIGH (ref 22–32)
Calcium: 8.3 mg/dL — ABNORMAL LOW (ref 8.9–10.3)
Chloride: 91 mmol/L — ABNORMAL LOW (ref 98–111)
Creatinine, Ser: 0.75 mg/dL (ref 0.61–1.24)
GFR calc Af Amer: >60 mL/min (ref >60)
GFR calc non Af Amer: >60 mL/min (ref >60)
Glucose, Bld: 115 mg/dL — ABNORMAL HIGH (ref 70–99)
Potassium: 4.8 mmol/L (ref 3.5–5.1)
Sodium: 141 mmol/L (ref 135–145)

## 2019-12-14 MED ORDER — FUROSEMIDE 20 MG PO TABS: ORAL_TABLET | ORAL | 5 refills | Status: DC

## 2019-12-14 NOTE — Progress Notes (Signed)
  Echocardiogram 2D Echocardiogram has been performed.  [name] 12/14/2019, 11:51 AM

## 2019-12-14 NOTE — Patient Instructions (Addendum)
INCREASE Lasix to 40mg  (2 tabs) in the morning and 20 mg (1 tab) in the evening  Labs today We will only contact you if something comes back abnormal or we need to make some changes. Otherwise no news is good news!  Repeat labs in 2 weeks  Your physician recommends that you schedule a follow-up appointment in: 3 months with Dr Aundra Dubin  Please call office at [phone] option [phone] if you have any questions or concerns.   At the [facility], you and your health needs are our priority. As part of our continuing mission to provide you with exceptional heart care, we have created designated Provider Care Teams. These Care Teams include your primary Cardiologist (physician) and Advanced Practice Providers (APPs- Physician Assistants and Nurse Practitioners) who all work together to provide you with the care you need, when you need it.   You may see any of the following providers on your designated Care Team at your next follow up: Marland Kitchen Dr Glori Bickers . Dr Loralie Champagne . Darrick Grinder, NP . [name], PA . Audry Riles, PharmD   Please be sure to bring in all your medications bottles to every appointment.

## 2019-12-15 ENCOUNTER — Telehealth (HOSPITAL_COMMUNITY): Payer: Self-pay | Admitting: Licensed Clinical Social Worker

## 2019-12-15 NOTE — Progress Notes (Signed)
Patient ID: [name], male   DOB: 1981/07/13, 39 y.o.   MRN: [id number] PCP: Dr. Tollie Pizza Cardiology: Dr. Aundra Dubin  39 y.o. with history of pectus excavatum s/p repair in childhood and redo pectus repair in 6/11 returns to cardiology clinic for followup of RV failure. He has chronic hypercarbic and and nocturnal hypoxemic respiratory failure.  He has been wearing Bipap at night.  Oxygen saturation drops with heavy exercise, so he uses oxygen when he will be exercising.  Echo 5/16 showed normal LV EF but did not show the RV well.  I had him get a cardiac MRI in 6/16 for better view of the RV.  This showed moderate to severe RV dilation with mild to moderate RV systolic dysfunction, RV EF 37%.  This looked very similar to the 2010 MRI.   In 8/20, he was admitted to the hospital in [place] with acute hypercarbic respiratory failure.  He was intubated, eventually able to extubate. He was extensively diuresed.  Cardiac MRI showed markedly dilated RV with moderately decreased systolic function, D-shaped septum, and left-ward shift of the heart.  The left atrium was compressed between the ascending and descending aorta.  RHC showed elevated right and left heart filling pressures.  Echo bubble study was negative. Aspiration PNA was also a concern at this admission.    He had redo pectus surgery 08/10/19 in [place] with Dr. [name].    Echo was done today and reviewed, EF 55%, compressed LA, normal RV size with mildly decreased systolic function, D-shaped septum (mildly), CVP estimate 8, unable to estimate PASP.   Symptomatically, he seems about the same as before the surgery, he has not seen much improvement and is actually requiring more oxygen.  He is doing pulmonary rehab in [place]. Still short of breath with moderate exertion.  No orthopnea/PND.  No chest pain.  No lightheadedness. He is using a Trilogy Bipap with full facemask.  He is wearing oxygen at all times.  He can walk a mile but now  it takes him 30 minutes even when wearing oxygen.  He is thinking about applying for long-term disability.   ECG (personally reviewed): NSR, iRBBB, LAFB  Labs (8/10): BNP 9, K 3.6, creatinine 0.7  Labs (2/14): K 3.8, creatinine 0.8, BNP not elevated Labs (2/16): K 3.4, creatinine 0.81 Labs (6/16): K 4, creatinine 0.73, BNP 12 Labs (11/18): K 4.1, creatinine 0.82, pro-BNP 6, hgb 15.2 Labs (7/20): pro-BNP 62, K 5, creatinine 0.85  Labs (8/20): K 4.6, creatinine 0.69 Labs (9/20): ABG 7.35/59/87  Labs (11/20): K 3.4, creatinine 0.75 Labs (1/21): BNP 23, K 4.2, creatinine 0.7 Labs (2/21): ABG 7.33/81/76   Allergies (verified):  No Known Drug Allergies   Past Medical History:  1. Pectus excavatum status post repair in [place] in childhood and repeat repair at [facility] in 6/11. Borderline Marfanoid habitus.  - Pectus redo repair in [place] by Dr. [name] in 12/20.  2. Asthma.  3. CHF: Patient was admitted in 6/10 with CHF and volume overload. He was diuresed with IV lasix and felt better. Echo suggested that the RV was enlarged. RHC was done (after several doses of IV lasix) showing mean RA 13, RV 38/12, PA 42/23 mean 34, mean PCWP 15. PVR < 2 WU. Shunt run showed no O2 step up. Patient was hypoxic and was started O2. Cardiac MRI (6/10) showed normal LV size and systolic function EF [id number], D-shaped IV septum, moderate to severe RV dilation with RV EF 37%. The  pulmonic valve opened normally. The heart was shifted leftward (pectus) with compression of the LA between the ascending and descending aorta. MRA of the chest showed normal pulmonary veins. CHF was primarily right-sided with mild pulmonary HTN noted. He is hypoxic, especially at night, so hypoxic pulmonary vasoconstriction could be playing a role. PCWP was only mildly elevated but I wonder if LA compression could be playing a role. Repeat echo after pectus surgery in 11/11 showed normal LV systolic function and size with a D-shaped  interventricular septum. The LA was still compressed between ascending and descending aorta. The RV was moderately dilated with mild systolic dysfunction.  Echo (5/16) with EF 5-60%, aortic root 4.0 cm, RV poorly vsiualized.  Cardiac MRI (6/16) with pectus excavatum, left-shifted heart, LA compressed between ascending and descending aorta, LV EF 58% with D-shaped interventricular septum suggestive of RV pressure/volume overload, moderate to severe RV dilation with mild to moderate RV systolic dysfunction (RV EF 37%), dilated PA (this MRI is very similar to prior in 2010).  - RHC (8/20): mean RA 11, PA 51/30 mean 41, mean PCWP 18, PAPI 1.9, CVP/PCWP 0.61 - Limited echo for bubble study (8/20): Negative bubble study.  - Cardiac MRI (8/20): The left lung appears collapsed, compressed by leftwards shift of the heart and dilated RV.  Dilated main pulmonary artery. Normal LV size with mild diffuse hypokinesis, EF 52%. Markedly dilated RV with EF 31%, moderate hypokinesis. D-shaped interventricular septum, suggestive of RV pressure/volume overload. Small left atrium appears compressed between the aortic root/ascending aorta and the descending aorta/ventrebral column. Nonspecific LGE pattern at the RV insertion sites, suggestive of RV pressure/volume overload. - Echo (4/21): EF 55%, compressed LA, normal RV size with mildly decreased systolic function, D-shaped septum (mildly), CVP estimate 8, unable to estimate PASP.  4. 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  - High resolution CT chest (8/19): Bilateral pleuroparenchymal scarring, cirrhosis noted. No ILD.  - CT chest (8/20): Severe pectus deformity, heart shifted left with extensive left lung compressive atelectasis.  5. 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 Patient is on Bipap.  6. Cirrhosis: Noted on RUQ [place] in 8/20.  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  -  MRI liver in 10/20 showed hepatic  hemangioma and lobulated liver contours concerning for cardiogenic hepatic congestion, no frank cirrhosis.  Family History:  HTN-Mother   Social History:  The patient lives in [place] with his wife and son.  Patient currently out of work (prior in [employer]) => working towards disability.  He drinks 1-6 drinks per week, never having more than 3 drinks in 1 evening. No illicit drug use.  Patient states former smoker. Started at age 47. only smoked socially. Quit smoking at age 62.   Review of Systems  All systems reviewed and negative except as per HPI.  Current Outpatient Medications  Medication Sig Dispense Refill  . acetaminophen (TYLENOL) 500 MG tablet Take 1,000 mg by mouth every 6 (six) hours as needed for mild pain.    Marland Kitchen albuterol (PROAIR HFA) 108 (90 BASE) MCG/ACT inhaler Inhale 2 puffs into the lungs every 6 (six) hours as needed for wheezing or shortness of breath. 8.5 each 0  . bisoprolol (ZEBETA) 5 MG tablet Take 1 tablet (5 mg total) by mouth daily. 90 tablet 3  . cetirizine (ZYRTEC) 10 MG tablet Take 1 tablet (10 mg total) by mouth daily. 30 tablet 2  . furosemide (LASIX) 20 MG tablet Take 2 tablets (40  mg total) by mouth every morning AND 1 tablet (20 mg total) every evening. Please cancel all previous orders for current medication. Change in dosage or pill size.. 90 tablet 5  . ibuprofen (ADVIL) 400 MG tablet Take 400 mg by mouth every 6 (six) hours as needed.    . isosorbide mononitrate (IMDUR) 30 MG 24 hr tablet Take 1 tablet (30 mg total) by mouth daily. Need appt for future refills [phone] 30 tablet 11  . Melatonin 10 MG TABS Take 10 mg by mouth daily as needed.     . methocarbamol (ROBAXIN) 500 MG tablet Take 500 mg by mouth every 8 (eight) hours.    . Omeprazole (PRILOSEC PO) Take 1 capsule by mouth 2 (two) times daily.     . potassium chloride SA (KLOR-CON) 20 MEQ tablet Take 1 tablet (20 mEq total) by mouth daily. 90 tablet 3  . spironolactone (ALDACTONE) 25 MG  tablet Take 1 tablet (25 mg total) by mouth daily. 90 tablet 3   No current facility-administered medications for this encounter.    BP (!) 146/72   Pulse 81   Wt 105.7 kg (233 lb)   SpO2 96%   BMI 28.36 kg/m  General: NAD Neck: JVP 8 cm, no thyromegaly or thyroid nodule.  Lungs: Clear to auscultation bilaterally with normal respiratory effort. CV: Nondisplaced PMI.  Heart regular S1/S2, no S3/S4, no murmur.  No peripheral edema.  No carotid bruit.  Normal pedal pulses.  Abdomen: Soft, nontender, no hepatosplenomegaly, mild distention.  Skin: Intact without lesions or rashes.  Neurologic: Alert and oriented x 3.  Psych: Normal affect. Extremities: No clubbing or cyanosis.  HEENT: Normal.   Assessment/Plan: 1. Chronic hypercarbic respiratory failure: Last CTA chest showed collapsed left lung with compression from dilated RV, no PE, RLL infiltrate (aspiration versus PNA).  This appears to be primarily a mechanical problem with severe pectus and leftward shift of the heart with compression of the left lung.  He has now had a redo pectus repair (12/20) in [place] by Dr. [name].  He does not seem any better post-surgery, actually has been using more oxygen.  - He is now doing pulmonary rehab in [place].   - I encouraged him to use incentive spirometry.  - To followup with Dr. Sabra Heck in [place] in several months and will have CT chest.  - He needs to work on weight loss.  2.Chronic diastolic CHF with RV failure: Patient presented with primarily right-sided CHF in 2010, likely due to pulmonary hypertension from a combination of left atrial compression and nocturnal hypoxemia. He is now using Bipap every night. He had a redo pectus surgery in 2011. I suspect the surgery relieved some of the compression on his lungs but followup cardiac MRI in 6/16 did show continued compression of the left atrium between the ascending and descending aorta as well as a dilated RV with RV EF 37%.  Cardiac MRI in 6/16 was very similar to the MRI in 2010. RHC in 8/20 showed elevated right and left heart filling pressures with primarily pulmonary venous hypertension.  PAPI low but not markedly low, suggesting mild-moderate RV dysfunction. The MRI repeated in 8/20 showed left-shifted heart with marked RV enlargement and RVEF 31%, LV EF 52%.  The LA is compressed between ascending and descending aorta, which may explain elevated PCWP.  Echo was done today and reviewed, EF 55% with compressed LA, D-shaped septum, normal RV size with mildly decreased systolic function.  Mild volume overload, NYHA  class III symptoms.  - Increase Lasix to 40 qam/20 qpm.  BMET today and again in 10 days.   - Continue spironolactone 25 mg daily and KCL 20 mEq daily.  -Continue Bipap at night and oxygen during the day to lower drive for hypoxemic pulmonary vasoconstriction. 2. 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 RHC in 8/20 moderate pulmonary hypertension, PA pressure 51/30. Significant component of pulmonary venous hypertension.  There is unlikely to be a group 1 component to his PH, so not sure pulmonary vasodilators would be helpful.  3. 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 He has had chronic hypercarbic respiratory failure. This is a major player in his chronic dyspnea. He has now had 3 operations for his pectus deformity. CT in 8/20 showed heart shifted left with compression of left lung. Most recent operation in [place] in 12/20 by Dr. [name].  He has not had significant improvement since surgery.  - Continue followup with Dr. [name].   4. Asthma:Chronic. 5. Cirrhosis: Abdominal US was concerning for cirrhosis.  He is not a heavy drinker.   My suspicion is that this may be related to RV failure.  - MRI liver in 10/20 showed hepatic hemangioma and lobulated liver contours concerning for cardiogenic hepatic congestion, no frank cirrhosis.   Followup in 3 months.     Loralie Champagne 12/15/2019

## 2019-12-15 NOTE — Telephone Encounter (Signed)
CSW consulted to speak with pt regarding disability.  Unable to reach- left message requesting return call.  Jorge Ny, LCSW Clinical Social Worker [facility] Desk#: [phone] Cell#: [phone]

## 2019-12-16 ENCOUNTER — Telehealth (HOSPITAL_COMMUNITY): Payer: Self-pay | Admitting: Licensed Clinical Social Worker

## 2019-12-16 NOTE — Telephone Encounter (Signed)
CSW called pt back to assist with disability questions.  Pt reports he already applied online for Disability very recently but hadn't heard anything back yet- CSW encouraged pt to inform me when he is assigned a case worker so I could be in contact with them and help get them required documentation quickly.  Pt then inquired if he could have Long Term disability through [place] at the same time.  CSW encouraged him to contact his [employer] rep through his former employer to discuss but that I didn't think there should be a restriction on this as he paid to get that plan so he should be entitled to it regardless.  Pt also stated that he needed some more paperwork filled out for his disability by the MDs so CSW provided with office fax numbers and encouraged him to reach out to me when it is faxed so I can ensure it gets to the right person.  CSW will continue to follow and assist as needed  Jorge Ny, [facility] Desk#: [phone] Cell#: [phone]

## 2019-12-28 ENCOUNTER — Other Ambulatory Visit (HOSPITAL_COMMUNITY): Payer: BC Managed Care – PPO

## 2020-03-10 ENCOUNTER — Telehealth (HOSPITAL_COMMUNITY): Payer: Self-pay | Admitting: Cardiology

## 2020-03-10 ENCOUNTER — Other Ambulatory Visit (HOSPITAL_COMMUNITY): Payer: Self-pay | Admitting: Internal Medicine

## 2020-03-10 DIAGNOSIS — I272 Pulmonary hypertension, unspecified: Secondary | ICD-10-CM

## 2020-03-10 DIAGNOSIS — I50812 Chronic right heart failure: Secondary | ICD-10-CM

## 2020-03-10 DIAGNOSIS — J9611 Chronic respiratory failure with hypoxia: Secondary | ICD-10-CM

## 2020-03-10 DIAGNOSIS — I509 Heart failure, unspecified: Secondary | ICD-10-CM

## 2020-03-10 MED ORDER — SPIRONOLACTONE 25 MG PO TABS: 25.0000 mg | ORAL_TABLET | Freq: Every day | ORAL | 0 refills | Status: DC

## 2020-03-10 MED ORDER — FUROSEMIDE 20 MG PO TABS: ORAL_TABLET | ORAL | 0 refills | Status: DC

## 2020-03-10 MED ORDER — ISOSORBIDE MONONITRATE ER 30 MG PO TB24: 30.0000 mg | ORAL_TABLET | Freq: Every day | ORAL | 0 refills | Status: DC

## 2020-03-10 MED ORDER — POTASSIUM CHLORIDE CRYS ER 20 MEQ PO TBCR: 20.0000 meq | EXTENDED_RELEASE_TABLET | Freq: Every day | ORAL | 0 refills | Status: DC

## 2020-03-10 MED ORDER — BISOPROLOL FUMARATE 5 MG PO TABS: 5.0000 mg | ORAL_TABLET | Freq: Every day | ORAL | 0 refills | Status: DC

## 2020-03-10 NOTE — Telephone Encounter (Signed)
Patient called to request medications transferred to local pharmacy in [facility] Reports he is out of town and forgot medications at home.  Advised I could refill medications we manage  meds sent to pharmacy .

## 2020-03-14 ENCOUNTER — Encounter (HOSPITAL_COMMUNITY): Payer: BC Managed Care – PPO | Admitting: Cardiology

## 2020-03-14 ENCOUNTER — Other Ambulatory Visit: Payer: Self-pay

## 2020-03-14 ENCOUNTER — Ambulatory Visit (HOSPITAL_COMMUNITY)
Admission: RE | Admit: 2020-03-14 | Discharge: 2020-03-14 | Disposition: A | Discharge status: Home / Self Care | Payer: BC Managed Care – PPO | Source: Ambulatory Visit | Attending: Cardiology | Admitting: Cardiology

## 2020-03-14 DIAGNOSIS — I50812 Chronic right heart failure: Secondary | ICD-10-CM | POA: Insufficient documentation

## 2020-03-14 LAB — BASIC METABOLIC PANEL
Anion gap: 8 (ref 5–15)
BUN: 13 mg/dL (ref 6–20)
CO2: 44 mmol/L — ABNORMAL HIGH (ref 22–32)
Calcium: 8.7 mg/dL — ABNORMAL LOW (ref 8.9–10.3)
Chloride: 90 mmol/L — ABNORMAL LOW (ref 98–111)
Creatinine, Ser: 0.89 mg/dL (ref 0.61–1.24)
GFR calc Af Amer: >60 mL/min (ref >60)
GFR calc non Af Amer: >60 mL/min (ref >60)
Glucose, Bld: 116 mg/dL — ABNORMAL HIGH (ref 70–99)
Potassium: 4.1 mmol/L (ref 3.5–5.1)
Sodium: 142 mmol/L (ref 135–145)

## 2020-04-01 ENCOUNTER — Other Ambulatory Visit (HOSPITAL_COMMUNITY): Payer: Self-pay | Admitting: Internal Medicine

## 2020-04-04 ENCOUNTER — Other Ambulatory Visit (HOSPITAL_COMMUNITY): Payer: Self-pay | Admitting: Cardiology

## 2020-04-04 DIAGNOSIS — I509 Heart failure, unspecified: Secondary | ICD-10-CM

## 2020-04-14 ENCOUNTER — Encounter (HOSPITAL_COMMUNITY): Payer: Self-pay | Admitting: *Deleted

## 2020-04-14 NOTE — Progress Notes (Signed)
Received pt's disability form from [employer], form is in process

## 2020-04-18 ENCOUNTER — Other Ambulatory Visit: Payer: Self-pay

## 2020-04-18 ENCOUNTER — Encounter (HOSPITAL_COMMUNITY): Payer: Self-pay | Admitting: Cardiology

## 2020-04-18 ENCOUNTER — Ambulatory Visit (HOSPITAL_COMMUNITY)
Admission: RE | Admit: 2020-04-18 | Discharge: 2020-04-18 | Disposition: A | Discharge status: Home / Self Care | Payer: BC Managed Care – PPO | Source: Ambulatory Visit | Attending: Cardiology | Admitting: Cardiology

## 2020-04-18 VITALS — BP 134/80 | HR 80 | Wt 236.2 lb

## 2020-04-18 DIAGNOSIS — I5032 Chronic diastolic (congestive) heart failure: Secondary | ICD-10-CM | POA: Diagnosis not present

## 2020-04-18 DIAGNOSIS — Z8249 Family history of ischemic heart disease and other diseases of the circulatory system: Secondary | ICD-10-CM | POA: Diagnosis not present

## 2020-04-18 DIAGNOSIS — J984 Other disorders of lung: Secondary | ICD-10-CM | POA: Insufficient documentation

## 2020-04-18 DIAGNOSIS — Z8774 Personal history of (corrected) congenital malformations of heart and circulatory system: Secondary | ICD-10-CM | POA: Diagnosis not present

## 2020-04-18 DIAGNOSIS — J45909 Unspecified asthma, uncomplicated: Secondary | ICD-10-CM | POA: Diagnosis not present

## 2020-04-18 DIAGNOSIS — K7460 Unspecified cirrhosis of liver: Secondary | ICD-10-CM | POA: Diagnosis not present

## 2020-04-18 DIAGNOSIS — D1803 Hemangioma of intra-abdominal structures: Secondary | ICD-10-CM | POA: Insufficient documentation

## 2020-04-18 DIAGNOSIS — J9811 Atelectasis: Secondary | ICD-10-CM | POA: Insufficient documentation

## 2020-04-18 DIAGNOSIS — G4736 Sleep related hypoventilation in conditions classified elsewhere: Secondary | ICD-10-CM | POA: Diagnosis not present

## 2020-04-18 DIAGNOSIS — J9612 Chronic respiratory failure with hypercapnia: Secondary | ICD-10-CM | POA: Diagnosis not present

## 2020-04-18 DIAGNOSIS — Z87891 Personal history of nicotine dependence: Secondary | ICD-10-CM | POA: Insufficient documentation

## 2020-04-18 DIAGNOSIS — I110 Hypertensive heart disease with heart failure: Secondary | ICD-10-CM | POA: Diagnosis not present

## 2020-04-18 DIAGNOSIS — I272 Pulmonary hypertension, unspecified: Secondary | ICD-10-CM | POA: Diagnosis not present

## 2020-04-18 DIAGNOSIS — Z791 Long term (current) use of non-steroidal anti-inflammatories (NSAID): Secondary | ICD-10-CM | POA: Insufficient documentation

## 2020-04-18 DIAGNOSIS — Z9981 Dependence on supplemental oxygen: Secondary | ICD-10-CM | POA: Diagnosis not present

## 2020-04-18 DIAGNOSIS — F909 Attention-deficit hyperactivity disorder, unspecified type: Secondary | ICD-10-CM | POA: Insufficient documentation

## 2020-04-18 DIAGNOSIS — Q676 Pectus excavatum: Secondary | ICD-10-CM | POA: Insufficient documentation

## 2020-04-18 DIAGNOSIS — Z79899 Other long term (current) drug therapy: Secondary | ICD-10-CM | POA: Diagnosis not present

## 2020-04-18 DIAGNOSIS — Z6828 Body mass index (BMI) 28.0-28.9, adult: Secondary | ICD-10-CM | POA: Diagnosis not present

## 2020-04-18 DIAGNOSIS — Z9989 Dependence on other enabling machines and devices: Secondary | ICD-10-CM | POA: Diagnosis not present

## 2020-04-18 DIAGNOSIS — E6609 Other obesity due to excess calories: Secondary | ICD-10-CM

## 2020-04-18 LAB — BASIC METABOLIC PANEL
Anion gap: 7 (ref 5–15)
BUN: 13 mg/dL (ref 6–20)
CO2: 42 mmol/L — ABNORMAL HIGH (ref 22–32)
Calcium: 8.7 mg/dL — ABNORMAL LOW (ref 8.9–10.3)
Chloride: 92 mmol/L — ABNORMAL LOW (ref 98–111)
Creatinine, Ser: 0.69 mg/dL (ref 0.61–1.24)
GFR calc Af Amer: >60 mL/min (ref >60)
GFR calc non Af Amer: >60 mL/min (ref >60)
Glucose, Bld: 118 mg/dL — ABNORMAL HIGH (ref 70–99)
Potassium: 4.2 mmol/L (ref 3.5–5.1)
Sodium: 141 mmol/L (ref 135–145)

## 2020-04-18 LAB — BRAIN NATRIURETIC PEPTIDE: B Natriuretic Peptide: 36.8 pg/mL (ref 0.0–100.0)

## 2020-04-18 NOTE — Patient Instructions (Addendum)
Labs done today, your results will be available in MyChart, we will contact you for abnormal readings.  You have been referred to Dr Leafy Ro for diet and weight management, her office will call you for an appointment  Please call our office in January 2022 to schedule your follow up appointment  If you have any questions or concerns before your next appointment please send [place] a message through [place] or call our office at [phone].    TO LEAVE A MESSAGE FOR THE NURSE SELECT OPTION 2, PLEASE LEAVE A MESSAGE INCLUDING:  YOUR NAME  DATE OF BIRTH  CALL BACK NUMBER  REASON FOR CALL**this is important as we prioritize the call backs  YOU WILL RECEIVE A CALL BACK THE SAME DAY AS LONG AS YOU CALL BEFORE 4:00 PM  At the [facility], you and your health needs are our priority. As part of our continuing mission to provide you with exceptional heart care, we have created designated Provider Care Teams. These Care Teams include your primary Cardiologist (physician) and Advanced Practice Providers (APPs- Physician Assistants and Nurse Practitioners) who all work together to provide you with the care you need, when you need it.   You may see any of the following providers on your designated Care Team at your next follow up:  Dr Glori Bickers  Dr Haynes Kerns, NP  [name], [place]  Audry Riles, PharmD   Please be sure to bring in all your medications bottles to every appointment.

## 2020-04-18 NOTE — Progress Notes (Signed)
Patient ID: [name], male   DOB: 11/26/1980, 39 y.o.   MRN: [id number] PCP: Dr. Tollie Pizza Cardiology: Dr. Aundra Dubin  39 y.o. with history of pectus excavatum s/p repair in childhood and redo pectus repair in 6/11 returns to cardiology clinic for followup of RV failure. He has chronic hypercarbic and and nocturnal hypoxemic respiratory failure.  He has been wearing Bipap at night.  Oxygen saturation drops with heavy exercise, so he uses oxygen when he will be exercising.  Echo 5/16 showed normal LV EF but did not show the RV well.  I had him get a cardiac MRI in 6/16 for better view of the RV.  This showed moderate to severe RV dilation with mild to moderate RV systolic dysfunction, RV EF 37%.  This looked very similar to the 2010 MRI.   In 8/20, he was admitted to the hospital in [place] with acute hypercarbic respiratory failure.  He was intubated, eventually able to extubate. He was extensively diuresed.  Cardiac MRI showed markedly dilated RV with moderately decreased systolic function, D-shaped septum, and left-ward shift of the heart.  The left atrium was compressed between the ascending and descending aorta.  RHC showed elevated right and left heart filling pressures.  Echo bubble study was negative. Aspiration PNA was also a concern at this admission.    He had redo pectus surgery 08/10/19 in [place] with Dr. [name].    Echo 4/21 showed EF 55%, compressed LA, normal RV size with mildly decreased systolic function, D-shaped septum (mildly), CVP estimate 8, unable to estimate PASP.   Symptomatically, he seems about the same as before the surgery, he has not seen much improvement.  He had repeat CT chest in [place] in 6/21, was told that he needs to lose weight.  He is doing pulmonary rehab in [place]. Short of breath with moderate exertion. No orthopnea/PND.  Weight up 3 lbs. He is using a Trilogy Bipap with full facemask.  He is wearing oxygen at all times.    Labs (8/10): BNP 9, K  3.6, creatinine 0.7  Labs (2/14): K 3.8, creatinine 0.8, BNP not elevated Labs (2/16): K 3.4, creatinine 0.81 Labs (6/16): K 4, creatinine 0.73, BNP 12 Labs (11/18): K 4.1, creatinine 0.82, pro-BNP 6, hgb 15.2 Labs (7/20): pro-BNP 62, K 5, creatinine 0.85  Labs (8/20): K 4.6, creatinine 0.69 Labs (9/20): ABG 7.35/59/87  Labs (11/20): K 3.4, creatinine 0.75 Labs (1/21): BNP 23, K 4.2, creatinine 0.7 Labs (2/21): ABG 7.33/81/76 Labs (7/21): K 4.1, creatinine 0.89   Allergies (verified):  No Known Drug Allergies   Past Medical History:  1. Pectus excavatum status post repair in [place] in childhood and repeat repair at [facility] in 6/11. Borderline Marfanoid habitus.  - Pectus redo repair in [place] by Dr. [name] in 12/20.  2. Asthma.  3. CHF: Patient was admitted in 6/10 with CHF and volume overload. He was diuresed with IV lasix and felt better. Echo suggested that the RV was enlarged. RHC was done (after several doses of IV lasix) showing mean RA 13, RV 38/12, PA 42/23 mean 34, mean PCWP 15. PVR < 2 WU. Shunt run showed no O2 step up. Patient was hypoxic and was started O2. Cardiac MRI (6/10) showed normal LV size and systolic function EF 38%, D-shaped IV septum, moderate to severe RV dilation with RV EF 37%. The pulmonic valve opened normally. The heart was shifted leftward (pectus) with compression of the LA between the ascending and descending aorta.  MRA of the chest showed normal pulmonary veins. CHF was primarily right-sided with mild pulmonary HTN noted. He is hypoxic, especially at night, so hypoxic pulmonary vasoconstriction could be playing a role. PCWP was only mildly elevated but I wonder if LA compression could be playing a role. Repeat echo after pectus surgery in 11/11 showed normal LV systolic function and size with a D-shaped interventricular septum. The LA was still compressed between ascending and descending aorta. The RV was moderately dilated with mild systolic dysfunction.   Echo (5/16) with EF 5-60%, aortic root 4.0 cm, RV poorly vsiualized.  Cardiac MRI (6/16) with pectus excavatum, left-shifted heart, LA compressed between ascending and descending aorta, LV EF 58% with D-shaped interventricular septum suggestive of RV pressure/volume overload, moderate to severe RV dilation with mild to moderate RV systolic dysfunction (RV EF 37%), dilated PA (this MRI is very similar to prior in 2010).  - RHC (8/20): mean RA 11, PA 51/30 mean 41, mean PCWP 18, PAPI 1.9, CVP/PCWP 0.61 - Limited echo for bubble study (8/20): Negative bubble study.  - Cardiac MRI (8/20): The left lung appears collapsed, compressed by leftwards shift of the heart and dilated RV.  Dilated main pulmonary artery. Normal LV size with mild diffuse hypokinesis, EF 52%. Markedly dilated RV with EF 31%, moderate hypokinesis. D-shaped interventricular septum, suggestive of RV pressure/volume overload. Small left atrium appears compressed between the aortic root/ascending aorta and the descending aorta/ventrebral column. Nonspecific LGE pattern at the RV insertion sites, suggestive of RV pressure/volume overload. - Echo (4/21): EF 55%, compressed LA, normal RV size with mildly decreased systolic function, D-shaped septum (mildly), CVP estimate 8, unable to estimate PASP.  4. 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  - High resolution CT chest (8/19): Bilateral pleuroparenchymal scarring, cirrhosis noted. No ILD.  - CT chest (8/20): Severe pectus deformity, heart shifted left with extensive left lung compressive atelectasis.  5. 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 Patient is on Bipap.  6. Cirrhosis: Noted on RUQ [place] in 8/20.  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  -  MRI liver in 10/20 showed hepatic hemangioma and lobulated liver contours concerning for cardiogenic hepatic congestion, no frank cirrhosis.  Family History:  HTN-Mother   Social History:   The patient lives in [place] with his wife and son.  Patient currently out of work (prior in [employer]) => working towards disability.  He drinks 1-6 drinks per week, never having more than 3 drinks in 1 evening. No illicit drug use.  Patient states former smoker. Started at age 68. only smoked socially. Quit smoking at age 53.   Review of Systems  All systems reviewed and negative except as per HPI.  Current Outpatient Medications  Medication Sig Dispense Refill  . acetaminophen (TYLENOL) 500 MG tablet Take 1,000 mg by mouth every 6 (six) hours as needed for mild pain.    Marland Kitchen albuterol (PROAIR HFA) 108 (90 BASE) MCG/ACT inhaler Inhale 2 puffs into the lungs every 6 (six) hours as needed for wheezing or shortness of breath. 8.5 each 0  . bisoprolol (ZEBETA) 5 MG tablet Take 1 tablet (5 mg total) by mouth daily. 30 tablet 0  . cetirizine (ZYRTEC) 10 MG tablet Take 1 tablet (10 mg total) by mouth daily. 30 tablet 2  . furosemide (LASIX) 20 MG tablet TAKE 2 TABLETS (40 MG TOTAL) BY MOUTH EVERY MORNING AND 1 TABLET (20 MG TOTAL) EVERY EVENING. PLEASE CANCEL ALL PREVIOUS ORDERS FOR CURRENT  MEDICATION. CHANGE IN DOSAGE OR PILL SIZE.. 270 tablet 1  . ibuprofen (ADVIL) 800 MG tablet Take 800 mg by mouth every 12 (twelve) hours.     . isosorbide mononitrate (IMDUR) 30 MG 24 hr tablet TAKE 1 TABLET (30 MG TOTAL) BY MOUTH DAILY. NEED APPT FOR FUTURE REFILLS [phone] 90 tablet 3  . Melatonin 10 MG TABS Take 10 mg by mouth daily as needed.     . Omeprazole (PRILOSEC PO) Take 1 capsule by mouth daily.     . Potassium Chloride ER 20 MEQ TBCR Take 2 tablets by mouth daily.    Marland Kitchen spironolactone (ALDACTONE) 25 MG tablet Take 1 tablet (25 mg total) by mouth daily. 30 tablet 0   No current facility-administered medications for this encounter.    BP 134/80   Pulse 80   Wt 107.1 kg (236 lb 3.2 oz)   SpO2 97% Comment: 3L of oxygen  BMI 28.75 kg/m  General: NAD Neck: No JVD, no thyromegaly or thyroid  nodule.  Lungs: Clear to auscultation bilaterally with normal respiratory effort. CV: Nondisplaced PMI.  Heart regular S1/S2, no S3/S4, no murmur.  No peripheral edema.  No carotid bruit.  Normal pedal pulses.  Abdomen: Soft, nontender, no hepatosplenomegaly, no distention.  Skin: Intact without lesions or rashes.  Neurologic: Alert and oriented x 3.  Psych: Normal affect. Extremities: No clubbing or cyanosis.  HEENT: Normal.   Assessment/Plan: 1. Chronic hypercarbic respiratory failure: Last CTA chest showed collapsed left lung with compression from dilated RV, no PE, RLL infiltrate (aspiration versus PNA).  This appears to be primarily a mechanical problem with severe pectus and leftward shift of the heart with compression of the left lung.  He has now had a redo pectus repair (12/20) in [place] by Dr. [name].  He does not seem any better post-surgery, actually has been using more oxygen.  - He is now doing pulmonary rehab in [place].   - I encouraged him to use incentive spirometry.  - Weight loss should help with lung restriction => [facility].   2.Chronic diastolic CHF with RV failure: Patient presented with primarily right-sided CHF in 2010, likely due to pulmonary hypertension from a combination of left atrial compression and nocturnal hypoxemia. He is now using Bipap every night. He had a redo pectus surgery in 2011. I suspect the surgery relieved some of the compression on his lungs but followup cardiac MRI in 6/16 did show continued compression of the left atrium between the ascending and descending aorta as well as a dilated RV with RV EF 37%. Cardiac MRI in 6/16 was very similar to the MRI in 2010. RHC in 8/20 showed elevated right and left heart filling pressures with primarily pulmonary venous hypertension.  PAPI low but not markedly low, suggesting mild-moderate RV dysfunction. The MRI repeated in 8/20 showed left-shifted heart with marked RV  enlargement and RVEF 31%, LV EF 52%.  The LA is compressed between ascending and descending aorta, which may explain elevated PCWP.  Echo in 4/21 showed EF 55% with compressed LA, D-shaped septum, normal RV size with mildly decreased systolic function. He is not volume overloaded, stable NYHA class III symptoms.  - Continue Lasix 40 qam/20 qpm and KCl 40 daily.  BMET/BNP today.    - Continue spironolactone 25 mg daily.  -Continue Bipap at night and oxygen during the day to lower drive for hypoxemic pulmonary vasoconstriction. 2. Pulmonary hypertension: Patient likely has had a combination of  secondary pulmonary hypertension from LA compression (Group 2, pulmonary venous hypertension) as well as hypoxemic pulmonary vasoconstriction fromchronic hypoxemic/hypercarbic respiratory failure(Group 3). RHC in 8/20 moderate pulmonary hypertension, PA pressure 51/30. Significant component of pulmonary venous hypertension.  There is unlikely to be a group 1 component to his PH, so not sure pulmonary vasodilators would be helpful.  3. 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 He has had chronic hypercarbic respiratory failure. This is a major player in his chronic dyspnea. He has now had 3 operations for his pectus deformity. CT in 8/20 showed heart shifted left with compression of left lung. Most recent operation in [place] in 12/20 by Dr. [name].  He has not had significant improvement since surgery.  - Continue followup with Dr. [name].   - Weight loss should help decrease mechanical restriction => referral to [facility] as above.  4. Asthma:Chronic. 5. Cirrhosis: Abdominal US was concerning for cirrhosis.  He is not a heavy drinker.  My suspicion is that this may be related to RV failure.  - MRI liver in 10/20 showed hepatic hemangioma and lobulated liver contours concerning for cardiogenic hepatic congestion, no frank cirrhosis.  6. ADHD: Ok  to restart Adderall.    Loralie Champagne 04/18/2020

## 2020-04-26 ENCOUNTER — Telehealth (HOSPITAL_COMMUNITY): Payer: Self-pay

## 2020-04-26 NOTE — Telephone Encounter (Signed)
Patients disability forms filled out,signed and faxed. Forms will be scanned into patients chart,orginals sent to patients address on file.

## 2020-05-23 ENCOUNTER — Encounter (HOSPITAL_COMMUNITY): Payer: Self-pay

## 2020-05-23 NOTE — Progress Notes (Unsigned)
Records request received from [facility]. Requested information for timeframe 04/09/2020 to present.  Records faxed to [phone]

## 2020-05-27 ENCOUNTER — Other Ambulatory Visit (HOSPITAL_COMMUNITY): Payer: Self-pay | Admitting: Internal Medicine

## 2020-06-25 ENCOUNTER — Other Ambulatory Visit (HOSPITAL_COMMUNITY): Payer: Self-pay

## 2020-06-25 DIAGNOSIS — I509 Heart failure, unspecified: Secondary | ICD-10-CM

## 2020-06-27 MED ORDER — SPIRONOLACTONE 25 MG PO TABS: 25.0000 mg | ORAL_TABLET | Freq: Every day | ORAL | 3 refills | Status: DC

## 2020-06-27 MED ORDER — BISOPROLOL FUMARATE 5 MG PO TABS: 5.0000 mg | ORAL_TABLET | Freq: Every day | ORAL | 3 refills | Status: DC

## 2020-07-28 ENCOUNTER — Telehealth: Payer: Self-pay | Admitting: Internal Medicine

## 2020-07-28 DIAGNOSIS — J9611 Chronic respiratory failure with hypoxia: Secondary | ICD-10-CM

## 2020-07-28 DIAGNOSIS — J9612 Chronic respiratory failure with hypercapnia: Secondary | ICD-10-CM

## 2020-07-28 NOTE — Telephone Encounter (Signed)
Called and spoke with patient advised that order has been sent to [employer] for new concentrator.  Appointment scheduled with Aaron Edelman in December and 30 min appointment in January with MR as requested by Aaron Edelman.  Nothing further needed.

## 2020-07-28 NOTE — Telephone Encounter (Signed)
Spoke with patient. He stated that his machine with [employer] stopped working last year (2020) and he had received a concentrator from a friend. The concentrator he received from a friend stopped working 3 days ago. He called [employer] but they told him that they needed an order since they haven't billed him in over a year. They were kind enough to give him 4 small tanks to last during the weekend. He stated that he had plans this weekend and the 4 tanks would not be enough.   He verified that he is on 3L of O2.   Aaron Edelman, MR is unavailable. Are you ok with signing the order on his behalf?

## 2020-07-28 NOTE — Telephone Encounter (Signed)
Yes I am okay with signing this order.  Please make sure patient is also scheduled for available 30-minute follow-up with Dr. Madelaine Etienne, FNP

## 2020-07-31 ENCOUNTER — Telehealth: Payer: Self-pay | Admitting: Internal Medicine

## 2020-07-31 DIAGNOSIS — J849 Interstitial pulmonary disease, unspecified: Secondary | ICD-10-CM

## 2020-07-31 DIAGNOSIS — J9611 Chronic respiratory failure with hypoxia: Secondary | ICD-10-CM

## 2020-07-31 DIAGNOSIS — I272 Pulmonary hypertension, unspecified: Secondary | ICD-10-CM

## 2020-07-31 NOTE — Telephone Encounter (Signed)
[employer] did receive the order for O2.  However, Per Leah with [employer], pt returned his O2 equipment in 02/2019 saying he no longer needed it, signing an [place].  Pt will need to be restarted including the testing & OV so the order can be processed.

## 2020-08-01 ENCOUNTER — Encounter: Payer: Self-pay | Admitting: Primary Care

## 2020-08-01 ENCOUNTER — Telehealth (INDEPENDENT_AMBULATORY_CARE_PROVIDER_SITE_OTHER): Payer: Self-pay | Admitting: Primary Care

## 2020-08-01 DIAGNOSIS — J9611 Chronic respiratory failure with hypoxia: Secondary | ICD-10-CM

## 2020-08-01 NOTE — Progress Notes (Signed)
Virtual Visit via Video Note  I connected with [name] on 08/01/20 at  3:00 PM EST by a video enabled telemedicine application and verified that I am speaking with the correct person using two identifiers.  Location: Patient: Home Provider: Office   I discussed the limitations of evaluation and management by telemedicine and the availability of in person appointments. The patient expressed understanding and agreed to proceed.  History of Present Illness: 39 year old male, former smoker quit 2008 (1 pack year history).  Past medical history significant for pectus excavatum, restrictive lung disease, chronic respiratory failure, diastolic CHF, pulmonary hypertension (group 2, group 3). Former patient of Dr. Lake Bells, now established with Dr. Creed Copper. Last seen in our office on 10/14/19.   He has had 2 reconstructive surgeries for his pectus excavatum, first in childhood at the age of 68 (Dr. Lilia Pro in [place]) and again in 2011 with [facility].  He was reevaluated in 2019 for further surgical options at [facility] where it was determined that there were none.  He is maintained on continuous oxygen 24/7 at 3 L and BiPAP QHS.  Compliance with oxygen has always been a little bit of a struggle.  He also follows with heart failure clinic and is on oral Lasix.   08/01/2020- Interim hx Patient contacted today virtually to renew oxygen. Patient called our office on 07/28/20 stating that his oxygen concentrator from a friend stopped working on 07/25/20. A new order was sent to his DME company but they never received it. Per [employer] he returned his oxygen equipment in June 2020 stating that he no longer needed it and signed an AMA. He needs OV today for O2 re-qualification to restart oxygen.    He uses 3-4L continuously. Triology ventilator  Starting doing pulmonary rehab   Confirmed O2 86% at rest by video    Observations/Objective:  - Able to speak in full sentences; mild  dyspnea with speak. No cough or wheezing - Wearing nasal canula oxygen  Patient reported:  - O2 86% RA at rest via pulse ox (visualized by myself during video visit)  Assessment and Plan:  Chronic respiratory failure - Confirmed O2 86% on RA at rest via pulse oximeter during video visit today. We will put a rush order to renew oxygen with [employer].  - Continue oxygen 3-4L/min, nighttime trilogy ventilator and pulmonary rehab   Follow Up Instructions:  - He has an apt in office with Wyn Quaker on 08/08/20 and Dr. [name] on 09/27/19   I discussed the assessment and treatment plan with the patient. The patient was provided an opportunity to ask questions and all were answered. The patient agreed with the plan and demonstrated an understanding of the instructions.   The patient was advised to call back or seek an in-person evaluation if the symptoms worsen or if the condition fails to improve as anticipated.  I provided 15 minutes of non-face-to-face time during this encounter.   Martyn Ehrich, NP

## 2020-08-01 NOTE — Telephone Encounter (Signed)
Called and spoke to pt. Pt states he only has a few tanks of oxygen, pt doesn't have a working Conservation officer, nature. There are no openings until Friday, pt will likely be out of oxygen by then.   LMTCB for Melissa with [employer] to see if pt can get oxygen now and qualify later, sign a waiver.   PCC's do you know if this can be done? Thanks.

## 2020-08-01 NOTE — Addendum Note (Signed)
Addended by: Collier Salina on: 08/01/2020 03:20 PM   Modules accepted: Orders

## 2020-08-01 NOTE — Telephone Encounter (Signed)
No pt turned in the 02 and now he has to requalify ne heed an appt to do this [name]

## 2020-08-01 NOTE — Telephone Encounter (Signed)
Pt already has an appointment for this on 08/08/20.

## 2020-08-01 NOTE — Telephone Encounter (Signed)
Called and spoke to [facility] with [employer] and was advised they can offer the waiver and give pt the O2 then qualify later but if pt were to not qualify then he will be private pay OR the pt can do a televisit and show desaturation during the virtual visit and the provider can document as such then insurance will accept it.   Spoke with Derl Barrow, NP, and was advised the appt can be made today for a virtual visit. Called and spoke to pt. Appt has been made for today 08/01/20. Will forward message to myself to assure pt gets O2.

## 2020-08-01 NOTE — Patient Instructions (Signed)
-   Continue oxygen 3-4L/min, nighttime trilogy ventilator and pulmonary rehab   Follow Up Instructions:  - Apt in office with Wyn Quaker on 08/08/20 and Dr. [name] on 09/27/19

## 2020-08-01 NOTE — Telephone Encounter (Signed)
New order placed for O2, as urgent.   Will send to PCCs to send this ASAP. Thanks.

## 2020-08-08 ENCOUNTER — Ambulatory Visit: Payer: BC Managed Care – PPO | Admitting: Primary Care

## 2020-08-08 ENCOUNTER — Encounter: Payer: Self-pay | Admitting: Primary Care

## 2020-08-08 ENCOUNTER — Other Ambulatory Visit: Payer: Self-pay

## 2020-08-08 ENCOUNTER — Inpatient Hospital Stay: Payer: BC Managed Care – PPO | Admitting: Pulmonary Disease

## 2020-08-08 VITALS — BP 128/78 | HR 72 | Ht 76.0 in | Wt 234.0 lb

## 2020-08-08 DIAGNOSIS — J9611 Chronic respiratory failure with hypoxia: Secondary | ICD-10-CM | POA: Diagnosis not present

## 2020-08-08 DIAGNOSIS — J449 Chronic obstructive pulmonary disease, unspecified: Secondary | ICD-10-CM

## 2020-08-08 DIAGNOSIS — J9612 Chronic respiratory failure with hypercapnia: Secondary | ICD-10-CM

## 2020-08-08 NOTE — Progress Notes (Signed)
$'@Patient'N$  ID: [name], male    DOB: 1981/04/26, 40 y.o.   MRN: [id number]  Chief Complaint  Patient presents with  . Follow-up    Pt states he still has SOB with activities and also has some cough due to throat irritation. Pt wears 4L pulse with POC and 4L with home concentrator. Pt wears trilogy vent at night.    Referring provider: [name], PA-C  HPI: 39 year old male, former smoker quit 2008 (1 pack year history).  Past medical history significant for pectus excavatum, restrictive lung disease, chronic respiratory failure, diastolic CHF, pulmonary hypertension (group 2, group 3). Former patient of Dr. Lake Bells, now established with Dr. Creed Copper. Last seen in our office on 10/14/19.   He has had 2 reconstructive surgeries for his pectus excavatum, first in childhood at the age of 32 (Dr. Lilia Pro in [place]) and again in 2011 with [facility].  He was reevaluated in 2019 for further surgical options at [facility] where it was determined that there were none.  He is maintained on continuous oxygen 24/7 at 3 L and Trilogy ventilator QHS.  Compliance with oxygen has always been a little bit of a struggle.  He also follows with heart failure clinic and is on oral Lasix.   Previous [facility] pulmonary encounter: 08/01/2020 Patient contacted today for video visit to renew oxygen. Patient called our office on 07/28/20 stating that his oxygen concentrator from a friend stopped working on 07/25/20. A new order was sent to his DME company but they never received it. Per [employer] he returned his oxygen equipment in June 2020 stating that he no longer needed it and signed an AMA. He needs OV today for O2 re-qualification to restart oxygen. He uses 3-4L oxygen continuously. He is wearing Triology ventilator at night. Confirmed O2 86% at rest by video. Continue pulmonary rehab.   08/08/2020- Interim hx Patient presents today for 1 week follow-up chronic respiratory failure. He is doing  well. He has a slight np cough, mostly related to PND symptoms. States that he was outside at a football game Friday 08/04/20. No hospitalizations since last December 2020 when he had redo pectus repair surgery in [place]. He is 100% compliant with oxygen use and Trilogy ventilator. He uses 3-4L oxygen at rest and 4L on exertion. He will occasionally pump O2 up to 6L with heavy exercise. Patient clarified today that he is looking to purchase a portable oxygen concentrator. He received a new machine last week but needs order and qualifying walk today. Patient is looking to buy POC outright and not rent it as he has already met his deductible. He is scheduled for routine follow-up with Dr. [name] in January 2021. He needs ABGs prior.    SYMPTOM SCALE - 05/25/2019  07/30/2019  08/08/2020   O2 use  2-3L at rest and 3-4 with exertion and TRiology qhs 3-4L at rest; 4L on exertion; 4-6L with heavy exercise; wearing triology at night   Shortness of Breath 0 -> 5 scale with 5 being worst (score 6 If unable to do)  0 -> 5 scale with 5 being worst (score 6 If unable to do)  At rest 0 2 1  Simple tasks - showers, clothes change, eating, shaving 0 3 1  Household (dishes, doing bed, laundry) $RemoveBefor'3 4 3  'fJKIBtNLtObR$ Shopping $RemoveBefo'1 4 2  'wbWpEWLiblW$ Walking level at own pace $Remove'2 3 1  'yQSTLas$ Walking keeping up with others of same age $Remove'3 4 3  'ApiKYKB$ Walking  up Stairs $RemoveB'4 5 4  'zjptOrdp$ Walking up [place] $Remov'4 5 4  'NPqNsN$ Total (40 - 48) Dyspnea Score $RemoveBefor'17 30 19  'cateaaSnaqjP$ How bad is your cough? 0 n/a 2 (secondary to PND, typically he does not have a cough)  How bad is your fatigue 0 3 3     Allergies  Allergen Reactions  . Other Itching    Pet Dander    Immunization History  Administered Date(s) Administered  . Influenza Split 06/16/2012  . Influenza Whole 06/09/2010, 06/10/2011  . Influenza,inj,Quad PF,6+ Mos 06/08/2013, 08/10/2015, 05/28/2018, 05/11/2019  . Influenza,inj,Quad PF,6-35 Mos 05/29/2020  . Influenza-Unspecified 06/09/2014  . Moderna SARS-COVID-2  Vaccination 11/26/2019, 12/27/2019  . Tdap 09/10/2009    Past Medical History:  Diagnosis Date  . Asthma   . CHF (congestive heart failure) ([place])   . Chronic hypercapnic respiratory failure (HCC)    nocturnal  hypoxemic resp failure, on bipap  . Pectus excavatum   . Restrictive lung disease     Tobacco History: Social History   Tobacco Use  Smoking Status Former Smoker  . Packs/day: 0.10  . Years: 1.00  . Pack years: 0.10  . Types: Cigarettes  . Quit date: 09/09/2006  . Years since quitting: 13.9  Smokeless Tobacco Never Used   Counseling given: Not Answered   Outpatient Medications Prior to Visit  Medication Sig Dispense Refill  . acetaminophen (TYLENOL) 500 MG tablet Take 1,000 mg by mouth every 6 (six) hours as needed for mild pain.    Marland Kitchen albuterol (PROAIR HFA) 108 (90 BASE) MCG/ACT inhaler Inhale 2 puffs into the lungs every 6 (six) hours as needed for wheezing or shortness of breath. 8.5 each 0  . amphetamine-dextroamphetamine (ADDERALL) 10 MG tablet One pill four times per day    . bisoprolol (ZEBETA) 5 MG tablet Take 1 tablet (5 mg total) by mouth daily. 30 tablet 3  . cetirizine (ZYRTEC) 10 MG tablet Take 1 tablet (10 mg total) by mouth daily. 30 tablet 2  . FLUoxetine (PROZAC) 20 MG capsule Take 20 mg by mouth daily.    . furosemide (LASIX) 20 MG tablet TAKE 2 TABLETS (40 MG TOTAL) BY MOUTH EVERY MORNING AND 1 TABLET (20 MG TOTAL) EVERY EVENING. PLEASE CANCEL ALL PREVIOUS ORDERS FOR CURRENT MEDICATION. CHANGE IN DOSAGE OR PILL SIZE.. 270 tablet 1  . ibuprofen (ADVIL) 800 MG tablet Take 800 mg by mouth every 12 (twelve) hours.     . isosorbide mononitrate (IMDUR) 30 MG 24 hr tablet TAKE 1 TABLET (30 MG TOTAL) BY MOUTH DAILY. NEED APPT FOR FUTURE REFILLS [id number] 90 tablet 3  . KLOR-CON M20 20 MEQ tablet TAKE 1 TABLET BY MOUTH EVERY DAY 30 tablet 11  . Melatonin 10 MG TABS Take 10 mg by mouth daily as needed.     . mirtazapine (REMERON) 15 MG tablet Take 15 mg by  mouth at bedtime.    . Omeprazole (PRILOSEC PO) Take 1 capsule by mouth daily.     Marland Kitchen spironolactone (ALDACTONE) 25 MG tablet Take 1 tablet (25 mg total) by mouth daily. 30 tablet 3  . Potassium Chloride ER 20 MEQ TBCR Take 2 tablets by mouth daily.     No facility-administered medications prior to visit.   Review of Systems  Review of Systems  Constitutional: Negative.   HENT: Positive for postnasal drip.   Respiratory: Positive for cough and shortness of breath. Negative for chest tightness and wheezing.        Dyspnea on exertion- baseline  Cardiovascular: Negative.     Physical Exam  BP 128/78 (BP Location: Left Arm, Cuff Size: Normal)   Pulse 72   Ht $R'6\' 4"'wi$  (1.93 m)   Wt 234 lb (106.1 kg)   SpO2 96%   BMI 28.48 kg/m  Physical Exam Constitutional:      Appearance: Normal appearance.  HENT:     Head: Normocephalic and atraumatic.  Cardiovascular:     Rate and Rhythm: Normal rate and regular rhythm.  Pulmonary:     Effort: Pulmonary effort is normal.     Breath sounds: Normal breath sounds. No wheezing, rhonchi or rales.     Comments: CTA Neurological:     General: No focal deficit present.     Mental Status: He is alert and oriented to person, place, and time. Mental status is at baseline.  Psychiatric:        Mood and Affect: Mood normal.        Behavior: Behavior normal.        Thought Content: Thought content normal.        Judgment: Judgment normal.      Lab Results:  CBC    Component Value Date/Time   WBC 4.1 04/27/2019 0522   RBC 4.18 (L) 04/27/2019 0522   HGB 12.7 (L) 04/27/2019 0522   HCT 40.6 04/27/2019 0522   PLT 133 (L) 04/27/2019 0522   MCV 97.1 04/27/2019 0522   MCH 30.4 04/27/2019 0522   MCHC 31.3 04/27/2019 0522   RDW 13.2 04/27/2019 0522   LYMPHSABS 1.0 04/27/2019 0522   MONOABS 0.6 04/27/2019 0522   EOSABS 0.0 04/27/2019 0522   BASOSABS 0.0 04/27/2019 0522    BMET    Component Value Date/Time   NA 141 04/18/2020 1244   K 4.2  04/18/2020 1244   CL 92 (L) 04/18/2020 1244   CO2 42 (H) 04/18/2020 1244   GLUCOSE 118 (H) 04/18/2020 1244   BUN 13 04/18/2020 1244   CREATININE 0.69 04/18/2020 1244   CALCIUM 8.7 (L) 04/18/2020 1244   GFRNONAA >60 04/18/2020 1244   GFRAA >60 04/18/2020 1244    BNP    Component Value Date/Time   BNP 36.8 04/18/2020 1244    ProBNP    Component Value Date/Time   PROBNP 62.0 03/22/2019 1012    Imaging: No results found.   Assessment & Plan:   Chronic respiratory failure with hypoxia and hypercapnia (HCC) He is doing well. No recent hospitalizations. Symptoms scale is improved compared to November 2020. Presents today to qualify for portable oxygen concentrator. O2 desaturated to 86% on RA at rest. Requiring 4L pulsed to maintain O2 >90%. Continue oxygen at 4L/min continuously. Sending new orderd for POC to [employer], he would like to buy machine instead of renting. Continue Trilogy ventilator every night. We will obtain ABGs prior to annually follow-up with Dr. [name] in January 2022.   Orders: - POC with [employer] 4L continuously (option to buy machine) - Needs ABGs done prior to next appointment   Follow-up: - Dr. [name] on 09/27/19      Martyn Ehrich, NP 08/08/2020

## 2020-08-08 NOTE — Patient Instructions (Addendum)
Chronic respiratory failure - Continue oxygen at 3-4L/min  - Continue Trilogy ventilator every night   Orders: - POC with [employer] (option to buy machine) - Needs ABGs done prior to appointment with Dr. [name]   Follow-up: - Please keep apt with Dr. [name] on 09/27/19 (can move this up sooner if he has availability)

## 2020-08-08 NOTE — Assessment & Plan Note (Signed)
He is doing well. No recent hospitalizations. Symptoms scale is improved compared to November 2020. Presents today to qualify for portable oxygen concentrator. O2 desaturated to 86% on RA at rest. Requiring 4L pulsed to maintain O2 >90%. Continue oxygen at 4L/min continuously. Sending new orderd for POC to [employer], he would like to buy machine instead of renting. Continue Trilogy ventilator every night. We will obtain ABGs prior to annually follow-up with Dr. [name] in January 2022.   Orders: - POC with [employer] 4L continuously (option to buy machine) - Needs ABGs done prior to next appointment   Follow-up: - Dr. [name] on 09/27/19

## 2020-09-26 ENCOUNTER — Encounter (INDEPENDENT_AMBULATORY_CARE_PROVIDER_SITE_OTHER): Payer: Self-pay

## 2020-09-26 ENCOUNTER — Other Ambulatory Visit: Payer: Self-pay

## 2020-09-26 ENCOUNTER — Ambulatory Visit (INDEPENDENT_AMBULATORY_CARE_PROVIDER_SITE_OTHER): Payer: BC Managed Care – PPO | Admitting: Internal Medicine

## 2020-09-26 ENCOUNTER — Encounter: Payer: Self-pay | Admitting: Internal Medicine

## 2020-09-26 VITALS — BP 124/84 | HR 104 | Temp 97.3°F | Ht 76.0 in | Wt 244.0 lb

## 2020-09-26 DIAGNOSIS — J9612 Chronic respiratory failure with hypercapnia: Secondary | ICD-10-CM | POA: Diagnosis not present

## 2020-09-26 DIAGNOSIS — R635 Abnormal weight gain: Secondary | ICD-10-CM

## 2020-09-26 DIAGNOSIS — J9611 Chronic respiratory failure with hypoxia: Secondary | ICD-10-CM | POA: Diagnosis not present

## 2020-09-26 DIAGNOSIS — Q676 Pectus excavatum: Secondary | ICD-10-CM

## 2020-09-26 NOTE — Patient Instructions (Addendum)
ICD-10-CM   1. Chronic respiratory failure with hypoxia and hypercapnia (HCC)  J96.11    J96.12   2. Pectus excavatum  Q67.6     Clincially stable  Plan  -Check arterial blood gas to see if there is any improvement in your carbon dioxide levels  -Do this anytime in the next few weeks -Continue oxygen - Continue nighttime trilogy ventilator -  Continue pulmonary rehabilitation and  -Continue surgical follow-up with [facility] surgeon who is now in [place] -re-refer to [place] and wellness clinic OR new referral  [place] [facility] doctor - Dr Janeal Holmes for weight loss  Follow-up -We will call with arterial blood gas results -Return in 6-9  months for routine follow-up

## 2020-09-26 NOTE — Progress Notes (Signed)
OV 05/25/2019  Subjective:  Patient ID: [name], male , DOB: 1981-01-05 , age 40 y.o. , MRN: [id number] , ADDRESS: [place] [address]   05/25/2019 -   Chief Complaint  Patient presents with  . Restrictive Lung Disease    Feels a little better since last visit.     ICD-10-CM   1. Chronic respiratory failure with hypoxia and hypercapnia (HCC)  J96.11    J96.12   2. Pectus excavatum  Q67.6      HPI [name] 40 y.o. -presents for follow-up of the above issues.  He was hospitalized approximately just over a month ago with significant acute on chronic hypercapnic and hypoxemic respiratory failure.  PCO2 was greater than 100 at that time.  He ultimately got discharged.  In between he saw my nurse practitioner and his PCO2 had come down to 59.  He is on trilogy ventilator at night along with 3 L of oxygen at night.  He is also on 1-2 L of oxygen in the daytime.  He has quit working.  He might be applying for disability him not so sure.  Is up-to-date with his vaccination status.  We have been looking at connecting him with a pectus excavatum redo surgeon.  A month ago I spoke to the surgeon Dr. Hinton Lovely at [facility].  She does not do anymore redo surgeries.  She referred [place] to Dr. Linna Hoff in [place].  Nurse practitioner has made a referral but neither as an outpatient have heard from him.  We also sent his CD-ROM of the CT scan to Dr. Threasa Alpha at [facility] in [place].  Waiting to hear.  Patient is interested in a referral to the [place] clinic because his family in the [place] area.  I have contacted Dr. West Carbo at the [facility] clinic to inquire about potential surgeon      OV 07/30/2019  Subjective:  Patient ID: [name], male , DOB: 08-01-1981 , age 40 y.o. , MRN: [id number] , ADDRESS: [place] [address]   07/30/2019 -   Chief Complaint  Patient presents with  . Follow-up    Pt states he still has  problems with his breathing. Denies any problems with coughing. Pt is on oxygen and goes between 2-3L   Follow-up chronic hypoxemic and hypercapnic respiratory failure from severe pectus excavatum  HPI [name] II 40 y.o. -last seen by myself mid September 2020.  In the interim he was able to establish contact with Dr. [name] surgeon in [place].  He saw them probably this month 2020.  He says he is being scheduled for surgery early December 2020.  He says he was extremely pleased with the surgeon's visit.  Redo surgery by placing bars and opening up 6 inches of space inside his thoracic cavity has been discussed.  He says he does not want to see another institution like [facility] in [place] or [facility].  He is hopeful that he will have a good outcome after the surgery.  He says the surgery might be delayed because of the COVID-19 pandemic.  He is frustrated by this.  Overall he feels his health is stable.  He has gained some weight after stopping the Adderall.  His Lasix has been increased recently and this is helped his dyspnea.  He is on 3 L of oxygen and Trilegy ventilator at night.  He does  not feel he is hypercapnic.  He does not think he needs a blood gas.  But his symptom scores are worse compared to September 2020.   Venous CO2 was suggest that when the CO2 in the venous system is over 48 correlates with hypercapnia.  Currently it is 16 and still is a bit higher than before but it could also reflect metabolic alkalosis from Lasix.         Results for [name], [name] (MRN [id number]) as of 05/25/2019 09:48  Ref. Range 04/25/2019 07:19 04/26/2019 05:26 04/26/2019 08:05 04/27/2019 08:35 05/14/2019 14:54  pH, Arterial Latest Ref Range: 7.350 - 7.450  7.193 (LL) 7.181 (LL) 7.231 (L) 7.316 (L) 7.351  pCO2 arterial Latest Ref Range: 32.0 - 48.0 mmHg 110 (HH) 119 (HH) 103 (HH) 88.1 (HH) 59.1 (H)  pO2, Arterial Latest Ref Range: 83.0 - 108.0 mmHg 74.2 (L) 85.7 91.4  95.3 86.8   Results for [name], [name] (MRN [id number]) as of 07/30/2019 10:38  Ref. Range 06/14/2019 15:44 06/24/2019 10:51 06/28/2019 09:51 07/22/2019 11:43 07/22/2019 12:26  CO2 Latest Ref Range: 22 - 32 mmol/L 35 (H) 35 (H)   37 (H)    OV 10/14/2019  Subjective:  Patient ID: [name], male , DOB: [birth year] , age 12 y.o. , MRN: [id number] , ADDRESS: [place] [address]   10/14/2019 -  follolwup pectus and chronic hypoxemic hypercapnic respiratory failure  HPI [name] II 40 y.o. -is a follow-up visit after undergoing redo pectus repair surgery in [place].  He says he had PLA bars and increased chest volume surgery was done with a redo repair.  He showed me his fresh scar.  It is healing well.  He had a video visit with the surgeon who is somewhat optimistic according to his history that he might be weaned off of oxygen and several months.  However at this point in time 1-2  month after the surgery he feels no subjective improvement.  He still on oxygen using trilogy ventilator.  He says his ECOG is now 3.  His dyspnea is class III.  He is not able to work.  He is asking for permanent disability application.  I agree with this.  He has not had a Covid vaccine asked him to apply for this through the state.  He had a chest x-ray done at [facility] in January 2021: I only reviewed the report.  He had an access to the image on his cell phone and he showed me the image which I visualized and agree with the formal interpretation and my own interpretation as well basically there is no change he is.  He might have increased chest volume.  There is generalized congestion.  Reviewed and interpreted recent blood work October 08, 2019: Creatinine 0.7 mg percent.  Carbon dioxide 37 which appears to be his baseline  His baseline blood gas shows CO2 between 60 and 88.    His most recent BNP was 23.     ROS - per HPI     has a past medical history of  Asthma, CHF (congestive heart failure) ([place]), Chronic hypercapnic respiratory failure ([place]), Pectus excavatum, and Restrictive lung disease.   OV 09/26/2020  Subjective:  Patient ID: [name], male , DOB: 04-14-1981 , age 16 y.o. , MRN: [id number] , ADDRESS: [address] [place] [address] PCP [name], PA-C Patient Care Team: Amie Critchley as PCP -  General (Family Medicine) Larey Dresser, MD as Consulting Physician (Cardiology) Clance, Armando Reichert, MD as Consulting Physician (Pulmonary Disease)  This Provider for this visit: Treatment Team:  Attending Provider: Brand Males, MD    09/26/2020 -   Chief Complaint  Patient presents with  . Follow-up    Still staying SOB with exertion   Follow-up chronic hypoxemic and hypercapnic respiratory failure with pectus excavatum  HPI [name] II 40 y.o. -presents for follow-up.  Is almost a year since I last saw him.  In the interim he saw a nurse practitioner in November 2021.  In December 2020 when he had COVID despite being vaccinated with 2 doses of mRNA vaccine.  We are presuming this was OMICRON.  He got better fast.  He uses the same oxygen at rest with exam and exertion.  He also uses his trilogy ventilator at night.  Overall he feels stable.  His symptom score is stable.  He did see cardiologist Dr. Loralie Champagne in August 2021.  He was referred to weight loss program.  He wants to be referred because since then he has gained more weight.  Apparently initially in the summer 2020 when he did not qualify.  He continues to pulmonary rehabilitation.  His last blood gas was nearly a year ago.  He is willing to get this repeated.  Of note his surgeon in [place] Dr. Sabra Heck has moved to [place].  He is trying to get himself established with a surgeon in August.   SYMPTOM SCALE - 05/25/2019  07/30/2019  09/26/2020   O2 use  2-3L at rest and 3-4 with exertion and TRiology qhs Same o2 and triology  use. Has gained weight  Shortness of Breath 0 -> 5 scale with 5 being worst (score 6 If unable to do)    At rest 0 2 2  Simple tasks - showers, clothes change, eating, shaving 0 3 3  Household (dishes, doing bed, laundry) 3 4 4   Shopping 1 4 3   Walking level at own pace 2 3 3   Walking up Stairs 4 5 4   Total (40 - 48) Dyspnea Score 10 21 20   How bad is your cough? 0 n/a 0  How bad is your fatigue 0 3 2  nausea   0  vomit   0  diarrhea   0  anxiety   2  depression   2    CT Chest data  No results found.  Results for BIRL, LOBELLO (MRN [id number]) as of 09/26/2020 10:16  Ref. Range 04/26/2019 08:05 04/27/2019 08:35 05/14/2019 14:54 08/03/2019 12:55 10/25/2019 14:35  pH, Arterial Latest Ref Range: 7.350 - 7.450  7.231 (L) 7.316 (L) 7.351 7.289 (L) 7.328 (L)  pCO2 arterial Latest Ref Range: 32.0 - 48.0 mmHg 103 (HH) 88.1 (HH) 59.1 (H) 85.0 (HH) 81.2 (HH)  pO2, Arterial Latest Ref Range: 83.0 - 108.0 mmHg 91.4 95.3 86.8 116 (H) 76.3 (L)    PFT  PFT Results Latest Ref Rng & Units 08/21/2017  FVC-Pre L 1.02  FVC-Predicted Pre % 16  FVC-Post L 1.05  FVC-Predicted Post % 16  Pre FEV1/FVC % % 72  Post FEV1/FCV % % 71  FEV1-Pre L 0.73  FEV1-Predicted Pre % 14  FEV1-Post L 0.74  DLCO uncorrected ml/min/mmHg 21.18  DLCO UNC% % 54  DLCO corrected ml/min/mmHg 21.75  DLCO COR %Predicted % 55  DLVA Predicted % 163  TLC L 2.81  TLC % Predicted % 35  RV %  Predicted % 83    has a past medical history of Asthma, CHF (congestive heart failure) ([place]), Chronic hypercapnic respiratory failure ([place]), Pectus excavatum, and Restrictive lung disease.   reports that he quit smoking about 14 years ago. His smoking use included cigarettes. He has a 0.10 pack-year smoking history. He has never used smokeless tobacco.  Past Surgical History:  Procedure Laterality Date  . KNEE SURGERY     right  . PECTUS EXCAVATUM REPAIR    . RIGHT HEART CATH N/A 04/21/2019   Procedure: RIGHT HEART CATH;   Surgeon: Larey Dresser, MD;  Location: [facility];  Service: Cardiovascular;  Laterality: N/A;    Allergies  Allergen Reactions  . Other Itching    Pet Dander    Immunization History  Administered Date(s) Administered  . Influenza Split 06/16/2012, 06/27/2012, 06/08/2013, 06/09/2014, 06/28/2014, 08/10/2015, 05/28/2018, 05/11/2019  . Influenza Whole 06/09/2010, 06/10/2011  . Influenza,inj,Quad PF,6+ Mos 06/08/2013, 08/10/2015, 05/28/2018, 05/11/2019  . Influenza,inj,Quad PF,6-35 Mos 05/29/2020  . Influenza,inj,quad, With Preservative 06/08/2013, 08/10/2015  . Influenza-Unspecified 06/09/2014, 05/22/2020  . Moderna Sars-Covid-2 Vaccination 11/26/2019, 12/27/2019  . Tdap 09/10/2009    Family History  Problem Relation Age of Onset  . Hypertension Mother   . Heart attack Neg Hx   . Stroke Neg Hx      Current Outpatient Medications:  .  acetaminophen (TYLENOL) 500 MG tablet, Take 1,000 mg by mouth every 6 (six) hours as needed for mild pain., Disp: , Rfl:  .  albuterol (PROAIR HFA) 108 (90 BASE) MCG/ACT inhaler, Inhale 2 puffs into the lungs every 6 (six) hours as needed for wheezing or shortness of breath., Disp: 8.5 each, Rfl: 0 .  amphetamine-dextroamphetamine (ADDERALL) 10 MG tablet, One pill four times per day, Disp: , Rfl:  .  bisoprolol (ZEBETA) 5 MG tablet, Take 1 tablet (5 mg total) by mouth daily., Disp: 30 tablet, Rfl: 3 .  busPIRone (BUSPAR) 10 MG tablet, Take 10 mg by mouth 3 (three) times daily., Disp: , Rfl:  .  cetirizine (ZYRTEC) 10 MG tablet, Take 1 tablet (10 mg total) by mouth daily., Disp: 30 tablet, Rfl: 2 .  FLUoxetine (PROZAC) 20 MG capsule, Take 20 mg by mouth daily., Disp: , Rfl:  .  furosemide (LASIX) 20 MG tablet, TAKE 2 TABLETS (40 MG TOTAL) BY MOUTH EVERY MORNING AND 1 TABLET (20 MG TOTAL) EVERY EVENING. PLEASE CANCEL ALL PREVIOUS ORDERS FOR CURRENT MEDICATION. CHANGE IN DOSAGE OR PILL SIZE.., Disp: 270 tablet, Rfl: 1 .  ibuprofen (ADVIL) 800  MG tablet, Take 800 mg by mouth every 12 (twelve) hours. , Disp: , Rfl:  .  isosorbide mononitrate (IMDUR) 30 MG 24 hr tablet, TAKE 1 TABLET (30 MG TOTAL) BY MOUTH DAILY. NEED APPT FOR FUTURE REFILLS [id number], Disp: 90 tablet, Rfl: 3 .  KLOR-CON M20 20 MEQ tablet, TAKE 1 TABLET BY MOUTH EVERY DAY, Disp: 30 tablet, Rfl: 11 .  Melatonin 10 MG TABS, Take 10 mg by mouth daily as needed. , Disp: , Rfl:  .  mirtazapine (REMERON) 15 MG tablet, Take 15 mg by mouth at bedtime., Disp: , Rfl:  .  Omeprazole (PRILOSEC PO), Take 1 capsule by mouth daily. , Disp: , Rfl:  .  spironolactone (ALDACTONE) 25 MG tablet, Take 1 tablet (25 mg total) by mouth daily., Disp: 30 tablet, Rfl: 3      Objective:   Vitals:   09/26/20 1004  BP: 124/84  Pulse: (!) 104  Temp: (!) 97.3 F (  36.3 C)  TempSrc: Oral  SpO2: (!) 88%  Weight: 244 lb (110.7 kg)  Height: 6\' 4"  (1.93 m)    Estimated body mass index is 29.7 kg/m as calculated from the following:   Height as of this encounter: 6\' 4"  (1.93 m).   Weight as of this encounter: 244 lb (110.7 kg).  @WEIGHTCHANGE @  [employer]   09/26/20 1004  Weight: 244 lb (110.7 kg)     Physical ExamGeneral: No distress. tall Neuro: Alert and Oriented x 3. GCS 15. Speech normal Psych: Pleasant Resp:  Barrel Chest - no.  Wheeze - no, Crackles - no, No overt respiratory distress. Has surgical pectus CVS: Normal heart sounds. Murmurs - no Ext: Stigmata of Connective Tissue Disease - no HEENT: Normal upper airway. PEERL +. No post nasal drip ABD: VISCERAL OBESITY       Assessment:       ICD-10-CM   1. Chronic respiratory failure with hypoxia and hypercapnia (HCC)  J96.11 Pulmonary function test   J96.12   2. Pectus excavatum  Q67.6   3. Weight gain  R63.5 Amb Ref to Medical Weight Management       Plan:     Patient Instructions     ICD-10-CM   1. Chronic respiratory failure with hypoxia and hypercapnia (HCC)  J96.11    J96.12   2. Pectus excavatum   Q67.6     Clincially stable  Plan  -Check arterial blood gas to see if there is any improvement in your carbon dioxide levels  -Do this anytime in the next few weeks -Continue oxygen - Continue nighttime trilogy ventilator -  Continue pulmonary rehabilitation and  -Continue surgical follow-up with [facility] surgeon who is now in [place] -re-refer to [place] and wellness clinic OR new referral  [place] [facility] doctor - Dr Janeal Holmes for weight loss  Follow-up -We will call with arterial blood gas results -Return in 6-9  months for routine follow-up     SIGNATURE    Dr. Brand Males, M.D., F.C.C.P,  Pulmonary and Critical Care Medicine Staff Physician, [place] Director - Interstitial Lung Disease  Program  Pulmonary [place] at [place], [place], [address]  Pager: [phone], If no answer or between  15:00h - 7:00h: call [phone] Telephone: [phone]  10:26 AM 09/26/2020

## 2020-10-04 ENCOUNTER — Other Ambulatory Visit (HOSPITAL_COMMUNITY): Payer: Self-pay

## 2020-10-04 MED ORDER — SPIRONOLACTONE 25 MG PO TABS: 25.0000 mg | ORAL_TABLET | Freq: Every day | ORAL | 0 refills | Status: DC

## 2020-10-09 NOTE — Telephone Encounter (Signed)
Dr. Lavell Anchors please see blood gas results below:   Blood Gas, Arterial Specimen:  Blood, Arterial  Ref Range & Units Today  PH ARTERIAL 7.350 - 7.450 7.297Low Panic   PCO2 35.0 - 45.0 mmHg 86.9High Panic   PO2 ARTERIAL 80.0 - 100.0 mmHg 66.4Low   HCO3 21 - 28 mmol/L 42High Panic   BASE EXCESS -2 - 2 mmol/L 12High   OXYGEN SATURATION MEASURED ART % 91.3   PATIENT TEMPERATURE Deg 98.6   VENTILATION MODE ARTERIAL  [place]   LITER FLOW ARTERIAL L/min 4   Resulting Agency  [facility]  Specimen Collected: 10/09/20 15:14 Last Resulted: 10/09/20 15:20  Received From: [facility]  Result Received: 10/09/20 15:55  View Encounter

## 2020-10-10 NOTE — Telephone Encounter (Signed)
Dr. Elsworth Soho, Please see patient comment and ABG from yesterday.  I sent this to Dr. [name] yesterday, but received no response.  Please advise.

## 2020-10-11 ENCOUNTER — Telehealth: Payer: Self-pay | Admitting: Internal Medicine

## 2020-10-11 DIAGNOSIS — J9611 Chronic respiratory failure with hypoxia: Secondary | ICD-10-CM

## 2020-10-11 NOTE — Telephone Encounter (Signed)
Called and spoke with Patient.  Patient is calling requesting recommendations on 10/09/20 ABG. Patient is concerned about results being abnormal.   Dr. [name], please advise   Blood Gas, Arterial Specimen: Blood, Arterial  Ref Range & Units Today  PH ARTERIAL 7.350 - 7.450 7.297Low Panic   PCO2 35.0 - 45.0 mmHg 86.9High Panic   PO2 ARTERIAL 80.0 - 100.0 mmHg 66.4Low   HCO3 21 - 28 mmol/L 42High Panic   BASE EXCESS -2 - 2 mmol/L 12High   OXYGEN SATURATION MEASURED ART % 91.3   PATIENT TEMPERATURE Deg 98.6   VENTILATION MODE ARTERIAL  [place]   LITER FLOW ARTERIAL L/min 4   Resulting Agency  [facility]  Specimen Collected: 10/09/20 15:14 Last Resulted: 10/09/20 15:20  Received From: [facility]  Result Received: 10/09/20 15:55  View Encounter

## 2020-10-11 NOTE — Telephone Encounter (Signed)
Dr. [name] please advise on patient message about ABG  Dr. Lavell Anchors please see blood gas results below:   Blood Gas, Arterial Specimen:  Blood, Arterial  Ref Range & Units Today  PH ARTERIAL 7.350 - 7.450 7.297Low Panic   PCO2 35.0 - 45.0 mmHg 86.9High Panic   PO2 ARTERIAL 80.0 - 100.0 mmHg 66.4Low   HCO3 21 - 28 mmol/L 42High Panic   BASE EXCESS -2 - 2 mmol/L 12High   OXYGEN SATURATION MEASURED ART % 91.3   PATIENT TEMPERATURE Deg 98.6   VENTILATION MODE ARTERIAL  [place]   LITER FLOW ARTERIAL L/min 4   Resulting Agency  [facility]  Specimen Collected: 10/09/20 15:14 Last Resulted: 10/09/20 15:20  Received From: [facility]  Result Received: 10/09/20 15:55  View Encounter

## 2020-10-11 NOTE — Telephone Encounter (Signed)
His blood gases have been running similar.  If he is feeling okay without any pedal edema there is worsening there is no indication for admission.  On the other hand I would recommend that he use BiPAP on the trilogy machine at least for 2 hours during the daytime in addition to nighttime.  What are his settings on the trilogy machine and who is the DME company?  I can try to get someone to adjust the settings  Results for [name], [name] (MRN [id number]) as of 10/11/2020 15:05  Ref. Range 04/26/2019 08:05 04/27/2019 08:35 05/14/2019 14:54 08/03/2019 12:55 10/25/2019 14:35  FIO2 Unknown    32.00 28.00  O2 Content Latest Units: L/min 3.0 3.0 2.0    pH, Arterial Latest Ref Range: 7.350 - 7.450  7.231 (L) 7.316 (L) 7.351 7.289 (L) 7.328 (L)  pCO2 arterial Latest Ref Range: 32.0 - 48.0 mmHg 103 (HH) 88.1 (HH) 59.1 (H) 85.0 (HH) 81.2 (HH)

## 2020-10-11 NOTE — Telephone Encounter (Signed)
Called and spoke with patient to let him know of message from Dr. [name]. Patient expressed understanding and stated that he has his Trilogy through [place] number for his representative is [phone]. Patient also states he may need to add the humidified option to his oxygen and would like to see if he can get the option to fill his tanks at home he does not think that his concentrator has the capability to do that.   Dr. [name] please advise

## 2020-10-11 NOTE — Telephone Encounter (Signed)
There is a telephone encounter in regards to this as well will close this encounter   Brand Males, MD Just now (3:06 PM)       His blood gases have been running similar.  If he is feeling okay without any pedal edema there is worsening there is no indication for admission.  On the other hand I would recommend that he use BiPAP on the trilogy machine at least for 2 hours during the daytime in addition to nighttime.  What are his settings on the trilogy machine and who is the DME company?  I can try to get someone to adjust the settings  Results for CELEDONIO, SORTINO (MRN [id number]) as of 10/11/2020 15:05  Ref. Range 04/26/2019 08:05 04/27/2019 08:35 05/14/2019 14:54 08/03/2019 12:55 10/25/2019 14:35  FIO2 Unknown    32.00 28.00  O2 Content Latest Units: L/min 3.0 3.0 2.0    pH, Arterial Latest Ref Range: 7.350 - 7.450  7.231 (L) 7.316 (L) 7.351 7.289 (L) 7.328 (L)  pCO2 arterial Latest Ref Range: 32.0 - 48.0 mmHg 103 (HH) 88.1 (HH) 59.1 (H) 85.0 (HH) 81.2 (HH)

## 2020-10-13 NOTE — Telephone Encounter (Signed)
Yes that Is fine

## 2020-10-16 NOTE — Telephone Encounter (Signed)
Orders have been placed. Patient is aware. Nothing further needed.

## 2020-10-23 NOTE — Telephone Encounter (Signed)
Please advise on patient mychart message     I had my trilogy rep from [employer] come by last Friday and he adjusted the settings to move more air. He has the numbers and can give them to you.   Also the weight lose people from hospital said my BMI was still a few tenths off to qualify. My weight has since gone to almost 250. Thank you for your help in advance because my breathing has to improve soon.   [name]

## 2020-10-25 ENCOUNTER — Telehealth: Payer: Self-pay | Admitting: Internal Medicine

## 2020-10-25 MED ORDER — DOXYCYCLINE HYCLATE 100 MG PO TABS: 100.0000 mg | ORAL_TABLET | Freq: Two times a day (BID) | ORAL | 0 refills | Status: DC

## 2020-10-25 MED ORDER — PREDNISONE 20 MG PO TABS: ORAL_TABLET | ORAL | 0 refills | Status: AC

## 2020-10-25 NOTE — Telephone Encounter (Signed)
Needs to go get  A rapid covid test and if negative -> go get PCR Test -> if positive needs referral for MAB or anti viral  For now Take doxycycline 100mg  po twice daily x8 days; take after meals and avoid sunlight  Take prednisone 40 mg daily x 2 days, then 20mg  daily x 2 days, then 10mg  daily x 2 days, then 5mg  daily x 2 days and stop   Go to ER if worse  Allergies  Allergen Reactions  . Other Itching    Pet Dander

## 2020-10-25 NOTE — Telephone Encounter (Addendum)
Spoke with the pt  He is c/o severe HA off and on over the past 2 days  He takes tylenol for this w/o relief  He feels pressure and pain behind his eyes He states that he has had mild body aches  He states having increased SOB for a while now, same as last ov  He states his nose feels very crusted over and dry  o2 sats 88-93% on 4-6 lpm o2  He feels he needs something for a sinus infection  Please advise, thank you!

## 2020-10-25 NOTE — Telephone Encounter (Signed)
Called and spoke with patient, advised of recommendations per Dr. [name].  He verbalized understanding.  Verified pharmacy and prescriptions sent.  Nothing further needed.

## 2020-10-27 ENCOUNTER — Telehealth: Payer: Self-pay | Admitting: Internal Medicine

## 2020-10-27 NOTE — Telephone Encounter (Signed)
pls advise on what the ask is on most recent thread?  Seem all set

## 2020-10-29 ENCOUNTER — Other Ambulatory Visit (HOSPITAL_COMMUNITY): Payer: Self-pay | Admitting: Cardiology

## 2020-10-29 DIAGNOSIS — I272 Pulmonary hypertension, unspecified: Secondary | ICD-10-CM

## 2020-10-29 DIAGNOSIS — I50812 Chronic right heart failure: Secondary | ICD-10-CM

## 2020-10-29 DIAGNOSIS — J9611 Chronic respiratory failure with hypoxia: Secondary | ICD-10-CM

## 2020-10-29 DIAGNOSIS — J9612 Chronic respiratory failure with hypercapnia: Secondary | ICD-10-CM

## 2020-11-13 ENCOUNTER — Other Ambulatory Visit (HOSPITAL_COMMUNITY): Payer: Self-pay | Admitting: Cardiology

## 2020-11-13 DIAGNOSIS — I272 Pulmonary hypertension, unspecified: Secondary | ICD-10-CM

## 2020-11-13 DIAGNOSIS — J9611 Chronic respiratory failure with hypoxia: Secondary | ICD-10-CM

## 2020-11-13 DIAGNOSIS — I50812 Chronic right heart failure: Secondary | ICD-10-CM

## 2020-11-13 DIAGNOSIS — J9612 Chronic respiratory failure with hypercapnia: Secondary | ICD-10-CM

## 2020-11-24 ENCOUNTER — Telehealth (HOSPITAL_COMMUNITY): Payer: Self-pay

## 2020-11-24 NOTE — Telephone Encounter (Signed)
Late Entry:Received a fax requesting medical records from [place]. Records were successfully faxed to: [phone] on 11/23/2020 ,which was the number provided. Medical request form will be scanned into patients chart.

## 2020-12-06 ENCOUNTER — Other Ambulatory Visit (HOSPITAL_COMMUNITY): Payer: Self-pay | Admitting: Cardiology

## 2020-12-06 DIAGNOSIS — I509 Heart failure, unspecified: Secondary | ICD-10-CM

## 2020-12-13 DIAGNOSIS — Z736 Limitation of activities due to disability: Secondary | ICD-10-CM

## 2021-01-16 ENCOUNTER — Telehealth: Payer: Self-pay | Admitting: Internal Medicine

## 2021-01-17 NOTE — Telephone Encounter (Signed)
lmtcb for [employer].

## 2021-01-19 NOTE — Telephone Encounter (Signed)
Left message for [name] to call back.

## 2021-01-22 ENCOUNTER — Telehealth: Payer: Self-pay | Admitting: Internal Medicine

## 2021-01-22 NOTE — Telephone Encounter (Signed)
Attempted to call Lovena Le but unable to reach. Left message for her to return call. Due to multiple attempts trying to reach her and not being able to do so, encounter will be closed.

## 2021-01-22 NOTE — Telephone Encounter (Signed)
Called and spoke with Lovena Le and she wanted to follow up and see if MR needed any additional records on this pt sent over to the office.   She is the person to reach out to if MR needs to speak with any provider that is in her office and she can coordinate that.    Lovena Le would like to chat with one of the PCC's to give them some information that we may use later one.  IF one of you guys could call her back.  Her direct number is listed in this message. Thanks

## 2021-01-22 NOTE — Telephone Encounter (Signed)
Other encounter was closed.[name]

## 2021-01-24 NOTE — Telephone Encounter (Signed)
I have called & left a VM for [name] to return my call.

## 2021-01-26 NOTE — Telephone Encounter (Signed)
[name] is off today and she left vm for Lovena Le to call me.  I spoke to her.  She is from [place] in [place] - she is referral specialist and is following on all of their pt's they have seen int he past several years.  They saw pt in Sept 2020.  She is wanting to make sure we had received all of the records we need for him and to make sure we are aware of them as a resource.  She has e-mailed [facility] her info.  I told her message is still open and was routed to MR to make sure he has all he needs.

## 2021-02-09 ENCOUNTER — Telehealth (HOSPITAL_COMMUNITY): Payer: Self-pay | Admitting: Cardiology

## 2021-02-13 NOTE — Telephone Encounter (Signed)
MR, please advise if you need any additional clinical information from pt's cancer treatment center. Thanks. If not message can be closed.

## 2021-02-14 ENCOUNTER — Ambulatory Visit (HOSPITAL_COMMUNITY)
Admission: RE | Admit: 2021-02-14 | Discharge: 2021-02-14 | Disposition: A | Discharge status: Home / Self Care | Payer: BC Managed Care – PPO | Source: Ambulatory Visit | Attending: Cardiology | Admitting: Cardiology

## 2021-02-14 ENCOUNTER — Encounter (HOSPITAL_COMMUNITY): Payer: Self-pay | Admitting: Cardiology

## 2021-02-14 ENCOUNTER — Other Ambulatory Visit (HOSPITAL_COMMUNITY): Payer: Self-pay

## 2021-02-14 ENCOUNTER — Other Ambulatory Visit: Payer: Self-pay

## 2021-02-14 VITALS — BP 116/70 | HR 60 | Wt 247.4 lb

## 2021-02-14 DIAGNOSIS — Z8616 Personal history of COVID-19: Secondary | ICD-10-CM | POA: Diagnosis not present

## 2021-02-14 DIAGNOSIS — Z79899 Other long term (current) drug therapy: Secondary | ICD-10-CM | POA: Insufficient documentation

## 2021-02-14 DIAGNOSIS — I5032 Chronic diastolic (congestive) heart failure: Secondary | ICD-10-CM

## 2021-02-14 DIAGNOSIS — K7460 Unspecified cirrhosis of liver: Secondary | ICD-10-CM | POA: Insufficient documentation

## 2021-02-14 DIAGNOSIS — J9611 Chronic respiratory failure with hypoxia: Secondary | ICD-10-CM | POA: Diagnosis not present

## 2021-02-14 DIAGNOSIS — I272 Pulmonary hypertension, unspecified: Secondary | ICD-10-CM | POA: Diagnosis not present

## 2021-02-14 DIAGNOSIS — I444 Left anterior fascicular block: Secondary | ICD-10-CM | POA: Diagnosis not present

## 2021-02-14 DIAGNOSIS — I50812 Chronic right heart failure: Secondary | ICD-10-CM | POA: Diagnosis present

## 2021-02-14 DIAGNOSIS — J9612 Chronic respiratory failure with hypercapnia: Secondary | ICD-10-CM | POA: Diagnosis not present

## 2021-02-14 DIAGNOSIS — J45909 Unspecified asthma, uncomplicated: Secondary | ICD-10-CM | POA: Insufficient documentation

## 2021-02-14 DIAGNOSIS — Z8249 Family history of ischemic heart disease and other diseases of the circulatory system: Secondary | ICD-10-CM | POA: Insufficient documentation

## 2021-02-14 DIAGNOSIS — R188 Other ascites: Secondary | ICD-10-CM | POA: Diagnosis not present

## 2021-02-14 DIAGNOSIS — Z87891 Personal history of nicotine dependence: Secondary | ICD-10-CM | POA: Insufficient documentation

## 2021-02-14 LAB — BASIC METABOLIC PANEL
Anion gap: 6 (ref 5–15)
BUN: 9 mg/dL (ref 6–20)
CO2: 43 mmol/L — ABNORMAL HIGH (ref 22–32)
Calcium: 8.9 mg/dL (ref 8.9–10.3)
Chloride: 92 mmol/L — ABNORMAL LOW (ref 98–111)
Creatinine, Ser: 0.66 mg/dL (ref 0.61–1.24)
GFR, Estimated: >60 mL/min (ref >60)
Glucose, Bld: 105 mg/dL — ABNORMAL HIGH (ref 70–99)
Potassium: 3.7 mmol/L (ref 3.5–5.1)
Sodium: 141 mmol/L (ref 135–145)

## 2021-02-14 LAB — BRAIN NATRIURETIC PEPTIDE: B Natriuretic Peptide: 18.5 pg/mL (ref 0.0–100.0)

## 2021-02-14 MED ORDER — DAPAGLIFLOZIN PROPANEDIOL 10 MG PO TABS: 10.0000 mg | ORAL_TABLET | Freq: Every day | ORAL | 3 refills | Status: DC

## 2021-02-14 NOTE — Patient Instructions (Signed)
EKG done today.   Labs done today. We will contact you only if your labs are abnormal.  START Wilder Glade 10mg  (1 tablet) by mouth daily.   No other medication changes were made. Please continue all current medications as prescribed.  Your provider has requested that you have an abdominal ultrasound done. This has to be approved through your insurance company prior to scheduling, once approved we will contact you to schedule an appointment.   Your physician recommends that you schedule a follow-up appointment soon for an echo, 10 days for a lab only appointment and in 3 months with Dr. Aundra Dubin.   Your physician has requested that you have an echocardiogram. Echocardiography is a painless test that uses sound waves to create images of your heart. It provides your doctor with information about the size and shape of your heart and how well your heart's chambers and valves are working. This procedure takes approximately one hour. There are no restrictions for this procedure.  If you have any questions or concerns before your next appointment please send [place] a message through [place] or call our office at [phone].    TO LEAVE A MESSAGE FOR THE NURSE SELECT OPTION 2, PLEASE LEAVE A MESSAGE INCLUDING: . YOUR NAME . DATE OF BIRTH . CALL BACK NUMBER . REASON FOR CALL**this is important as we prioritize the call backs  YOU WILL RECEIVE A CALL BACK THE SAME DAY AS LONG AS YOU CALL BEFORE 4:00 PM   Do the following things EVERYDAY: 1) Weigh yourself in the morning before breakfast. Write it down and keep it in a log. 2) Take your medicines as prescribed 3) Eat low salt foods--Limit salt (sodium) to 2000 mg per day.  4) Stay as active as you can everyday 5) Limit all fluids for the day to less than 2 liters   At the [facility], you and your health needs are our priority. As part of our continuing mission to provide you with exceptional heart care, we have created designated  Provider Care Teams. These Care Teams include your primary Cardiologist (physician) and Advanced Practice Providers (APPs- Physician Assistants and Nurse Practitioners) who all work together to provide you with the care you need, when you need it.   You may see any of the following providers on your designated Care Team at your next follow up: Marland Kitchen Dr Glori Bickers . Dr Loralie Champagne . Darrick Grinder, NP . [name], PA . Audry Riles, PharmD   Please be sure to bring in all your medications bottles to every appointment.

## 2021-02-14 NOTE — Progress Notes (Signed)
CSW requested to meet with patient to discuss disability. Patient reports he worked for [employer] and became disables in August, 2020. He started receiving short term disability and then transitioned to long term disability. He reports that [place] who is the distributor of the disability program through his employer has started a Lawyer on his behalf. Patient asked if he could work a part time job in the future and CSW advised patient to explore with lawyer involved. Patient mentioned that he is attending a support group 2x weekly via zoom and finds it helpful and supportive. CSW discussed the [facility] and patient is very interested. Flyer for group provided. Patient denies any other concerns at this time. CSW available as needed. Raquel Sarna, [place], [place]

## 2021-02-15 NOTE — Progress Notes (Signed)
Patient ID: [name], male   DOB: 22-Jun-1981, 40 y.o.   MRN: [id number] PCP: Dr. Tollie Pizza Cardiology: Dr. Aundra Dubin  40 y.o. with history of pectus excavatum s/p repair in childhood and redo pectus repair in 6/11 returns to cardiology clinic for followup of RV failure. He has chronic hypercarbic and and nocturnal hypoxemic respiratory failure.  He has been wearing Bipap at night.  Oxygen saturation drops with heavy exercise, so he uses oxygen when he will be exercising.  Echo 5/16 showed normal LV EF but did not show the RV well.  I had him get a cardiac MRI in 6/16 for better view of the RV.  This showed moderate to severe RV dilation with mild to moderate RV systolic dysfunction, RV EF 37%.  This looked very similar to the 2010 MRI.   In 8/20, he was admitted to the hospital in [place] with acute hypercarbic respiratory failure.  He was intubated, eventually able to extubate. He was extensively diuresed.  Cardiac MRI showed markedly dilated RV with moderately decreased systolic function, D-shaped septum, and left-ward shift of the heart.  The left atrium was compressed between the ascending and descending aorta.  RHC showed elevated right and left heart filling pressures.  Echo bubble study was negative. Aspiration PNA was also a concern at this admission.    He had redo pectus surgery 08/10/19 in [place] with Dr. [name].    Echo 4/21 showed EF 55%, compressed LA, normal RV size with mildly decreased systolic function, D-shaped septum (mildly), CVP estimate 8, unable to estimate PASP.   Symptomatically, he seems about the same as before the surgery, he has not seen much improvement.  He had repeat CT chest in [place] in 6/21, was told that he needs to lose weight.  He continues with pulmonary rehab in [place]. Short of breath with moderate exertion, has to rest after 50-100 feet. Somewhat worse since he had COVID in 1/22.  No orthopnea/PND.  Weight up 11 lbs. He is using a Trilogy Bipap  with full facemask.  He is wearing oxygen at all times.  He is not drinking ETOH.   ECG (personally reviewed): NSR, LAFB, poor RWP  Labs (8/10): BNP 9, K 3.6, creatinine 0.7  Labs (2/14): K 3.8, creatinine 0.8, BNP not elevated Labs (2/16): K 3.4, creatinine 0.81 Labs (6/16): K 4, creatinine 0.73, BNP 12 Labs (11/18): K 4.1, creatinine 0.82, pro-BNP 6, hgb 15.2 Labs (7/20): pro-BNP 62, K 5, creatinine 0.85  Labs (8/20): K 4.6, creatinine 0.69 Labs (9/20): ABG 7.35/59/87  Labs (11/20): K 3.4, creatinine 0.75 Labs (1/21): BNP 23, K 4.2, creatinine 0.7 Labs (2/21): ABG 7.33/81/76 Labs (7/21): K 4.1, creatinine 0.89 Labs (8/21): K 4.2, creatinine 0.69, BNP 37   Allergies (verified):  No Known Drug Allergies   Past Medical History:  1. Pectus excavatum status post repair in [place] in childhood and repeat repair at [facility] in 6/11. Borderline Marfanoid habitus.  - Pectus redo repair in [place] by Dr. [name] in 12/20.  2. Asthma.  3. CHF: Patient was admitted in 6/10 with CHF and volume overload. He was diuresed with IV lasix and felt better. Echo suggested that the RV was enlarged. RHC was done (after several doses of IV lasix) showing mean RA 13, RV 38/12, PA 42/23 mean 34, mean PCWP 15. PVR < 2 WU. Shunt run showed no O2 step up. Patient was hypoxic and was started O2. Cardiac MRI (6/10) showed normal LV size and systolic function  EF 62%, D-shaped IV septum, moderate to severe RV dilation with RV EF 37%. The pulmonic valve opened normally. The heart was shifted leftward (pectus) with compression of the LA between the ascending and descending aorta. MRA of the chest showed normal pulmonary veins. CHF was primarily right-sided with mild pulmonary HTN noted. He is hypoxic, especially at night, so hypoxic pulmonary vasoconstriction could be playing a role. PCWP was only mildly elevated but I wonder if LA compression could be playing a role. Repeat echo after pectus surgery in 11/11 showed normal  LV systolic function and size with a D-shaped interventricular septum. The LA was still compressed between ascending and descending aorta. The RV was moderately dilated with mild systolic dysfunction.  Echo (5/16) with EF 5-60%, aortic root 4.0 cm, RV poorly vsiualized.  Cardiac MRI (6/16) with pectus excavatum, left-shifted heart, LA compressed between ascending and descending aorta, LV EF 58% with D-shaped interventricular septum suggestive of RV pressure/volume overload, moderate to severe RV dilation with mild to moderate RV systolic dysfunction (RV EF 37%), dilated PA (this MRI is very similar to prior in 2010).  - RHC (8/20): mean RA 11, PA 51/30 mean 41, mean PCWP 18, PAPI 1.9, CVP/PCWP 0.61 - Limited echo for bubble study (8/20): Negative bubble study.  - Cardiac MRI (8/20): The left lung appears collapsed, compressed by leftwards shift of the heart and dilated RV.  Dilated main pulmonary artery. Normal LV size with mild diffuse hypokinesis, EF 52%. Markedly dilated RV with EF 31%, moderate hypokinesis. D-shaped interventricular septum, suggestive of RV pressure/volume overload. Small left atrium appears compressed between the aortic root/ascending aorta and the descending aorta/ventrebral column. Nonspecific LGE pattern at the RV insertion sites, suggestive of RV pressure/volume overload. - Echo (4/21): EF 55%, compressed LA, normal RV size with mildly decreased systolic function, D-shaped septum (mildly), CVP estimate 8, unable to estimate PASP.  4. 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  - High resolution CT chest (8/19): Bilateral pleuroparenchymal scarring, cirrhosis noted. No ILD.  - CT chest (8/20): Severe pectus deformity, heart shifted left with extensive left lung compressive atelectasis.  5. 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 Patient is on Bipap.  6. Cirrhosis: Noted on RUQ [place] in 8/20.  Likely due to RV  failure.  -  MRI liver in 10/20 showed hepatic hemangioma and lobulated liver contours concerning for cardiogenic hepatic congestion, no frank cirrhosis. 7. COVID-19 in 1/22  Family History:  HTN-Mother   Social History:  The patient lives in [place] with his wife and son.  Patient currently out of work (prior in [employer]) => working towards disability.  He drinks 1-6 drinks per week, never having more than 3 drinks in 1 evening. No illicit drug use.  Patient states former smoker. Started at age 1. only smoked socially. Quit smoking at age 60.   Review of Systems  All systems reviewed and negative except as per HPI.  Current Outpatient Medications  Medication Sig Dispense Refill   acetaminophen (TYLENOL) 500 MG tablet Take 1,000 mg by mouth every 6 (six) hours as needed for mild pain.     albuterol (PROAIR HFA) 108 (90 BASE) MCG/ACT inhaler Inhale 2 puffs into the lungs every 6 (six) hours as needed for wheezing or shortness of breath. 8.5 each 0   bisoprolol (ZEBETA) 5 MG tablet TAKE 1 TABLET BY MOUTH EVERY DAY 90 tablet 3   busPIRone (BUSPAR) 10 MG tablet Take 10 mg by mouth 3 (  three) times daily.     cetirizine (ZYRTEC) 10 MG tablet Take 1 tablet (10 mg total) by mouth daily. 30 tablet 2   dapagliflozin propanediol (FARXIGA) 10 MG TABS tablet Take 1 tablet (10 mg total) by mouth daily before breakfast. 90 tablet 3   FLUoxetine (PROZAC) 20 MG capsule Take 20 mg by mouth daily.     furosemide (LASIX) 20 MG tablet TAKE 2 TABLETS (40 MG TOTAL) BY MOUTH EVERY MORNING AND 1 TABLET (20 MG TOTAL) EVERY EVENING. NEEDS DOCTOR APPOINTMENT FOR FURTHER REFILL.. 270 tablet 1   ibuprofen (ADVIL) 800 MG tablet Take 800 mg by mouth every 12 (twelve) hours.      isosorbide mononitrate (IMDUR) 30 MG 24 hr tablet TAKE 1 TABLET (30 MG TOTAL) BY MOUTH DAILY. NEED APPT FOR FUTURE REFILLS [phone] 90 tablet 3   Melatonin 10 MG TABS Take 10 mg by mouth daily as needed.      mirtazapine (REMERON) 15 MG  tablet Take 15 mg by mouth at bedtime.     Omeprazole (PRILOSEC PO) Take 1 capsule by mouth daily.      Potassium Chloride ER 20 MEQ TBCR TAKE 2 TABLETS BY MOUTH EVERY DAY 180 tablet 3   spironolactone (ALDACTONE) 25 MG tablet TAKE 1 TABLET BY MOUTH EVERY DAY 90 tablet 1   No current facility-administered medications for this encounter.    BP 116/70   Pulse 60   Wt 112.2 kg (247 lb 6.4 oz)   SpO2 98% Comment: 5 L N/C  BMI 30.11 kg/m  General: NAD Neck: JVP 8-9 cm, no thyromegaly or thyroid nodule.  Lungs: Clear to auscultation bilaterally with normal respiratory effort. CV: Nondisplaced PMI.  Heart regular S1/S2, no S3/S4, no murmur.  No peripheral edema.  No carotid bruit.  Normal pedal pulses.  Abdomen: Soft, nontender, no hepatosplenomegaly, moderate distention.  Skin: Intact without lesions or rashes.  Neurologic: Alert and oriented x 3.  Psych: Normal affect. Extremities: No clubbing or cyanosis.  HEENT: Normal.   Assessment/Plan: 1. Chronic hypercarbic respiratory failure:  Last CTA chest showed collapsed left lung with compression from dilated RV, no PE, RLL infiltrate (aspiration versus PNA).  This appears to be primarily a mechanical problem with severe pectus and leftward shift of the heart with compression of the left lung.  He has now had a redo pectus repair (12/20) in [place] by Dr. [name].  He does not seem any better post-surgery, possibly worse.  - He is now doing pulmonary rehab in [place].   - I encouraged him to use incentive spirometry.  - Weight loss should help with lung restriction => [facility].   2. Chronic diastolic CHF with RV failure:  Patient presented with primarily right-sided CHF in 2010, likely due to pulmonary hypertension from a combination of left atrial compression and nocturnal hypoxemia. He is now using Bipap every night. He had a redo pectus surgery in 2011. I suspect the surgery relieved some of the  compression on his lungs but followup cardiac MRI in 6/16 did show continued compression of the left atrium between the ascending and descending aorta as well as a dilated RV with RV EF 37%.  Cardiac MRI in 6/16 was very similar to the MRI in 2010. RHC in 8/20 showed elevated right and left heart filling pressures with primarily pulmonary venous hypertension.  PAPI low but not markedly low, suggesting mild-moderate RV dysfunction. The MRI repeated in 8/20 showed left-shifted heart with  marked RV enlargement and RVEF 31%, LV EF 52%.  The LA is compressed between ascending and descending aorta, which may explain elevated PCWP.  Echo in 4/21 showed EF 55% with compressed LA, D-shaped septum, normal RV size with mildly decreased systolic function. Mild volume overload on exam, stable NYHA class III symptoms.  - Continue Lasix 40 qam/20 qpm and KCl 40 daily.  BMET/BNP today.    - Continue spironolactone 25 mg daily.  - I will add Farxiga 10 mg daily.  This will likely allow some extra diuresis.  BMET 10 days.  - Continue Bipap at night and oxygen during the day to lower drive for hypoxemic pulmonary vasoconstriction.     - Due for repeat echo, will arrange.  2. 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  RHC in 8/20 moderate pulmonary hypertension, PA pressure 51/30. Significant component of pulmonary venous hypertension.  There is unlikely to be a group 1 component to his PH, so not sure pulmonary vasodilators would be helpful.   3. Restrictive lung disease:  Lung disease from restriction due to pectus. He has had chronic hypercarbic respiratory failure. This is a major player in his chronic dyspnea. He has now had 3 operations for his pectus deformity. CT in 8/20 showed heart shifted  left with compression of left lung. Most recent operation in [place] in 12/20 by Dr. [name].  He has not had significant improvement since surgery.  - Continue followup with Dr. [name].   - Weight loss should help decrease mechanical restriction => referral [facility] as above.  4. Asthma: Chronic.   5. Cirrhosis: Abdominal US was concerning for cirrhosis.  He is not a heavy drinker.  My suspicion is that this may be related to RV failure. MRI liver in 10/20 showed hepatic hemangioma and lobulated liver contours concerning for cardiogenic hepatic congestion, no frank cirrhosis.  Abdomen is distended today.  - I will repeat abdominal US to assess for cirrhosis and to look for ascites.   Followup 3 months.    Loralie Champagne 02/15/2021

## 2021-02-16 NOTE — Telephone Encounter (Signed)
Doesn't look like we have any records from the cancer center, will route to [facility] as it looks like they were in contact with cancer center.  PCCs, please advise if anything needs to be done on clinical side. Thanks!

## 2021-02-16 NOTE — Telephone Encounter (Signed)
He does not have cancaer. ARe they the team who did his chest surgery? If so, need their initial consult, op note and most recent office note that is all. Maybe is already in care everywhere. IF so, no prob     has a past medical history of Asthma, CHF (congestive heart failure) ([place]), Chronic hypercapnic respiratory failure ([place]), Pectus excavatum, and Restrictive lung disease.   has a past surgical history that includes Pectus excavatum repair; Knee surgery; and RIGHT HEART CATH (N/A, 04/21/2019).

## 2021-02-19 NOTE — Telephone Encounter (Signed)
Left message for [name] to call back.

## 2021-02-22 ENCOUNTER — Ambulatory Visit (HOSPITAL_COMMUNITY): Payer: BC Managed Care – PPO

## 2021-02-26 ENCOUNTER — Other Ambulatory Visit (HOSPITAL_COMMUNITY): Payer: BC Managed Care – PPO

## 2021-02-28 ENCOUNTER — Ambulatory Visit (HOSPITAL_COMMUNITY)
Admission: RE | Admit: 2021-02-28 | Discharge: 2021-02-28 | Disposition: A | Discharge status: Home / Self Care | Payer: BC Managed Care – PPO | Source: Ambulatory Visit | Attending: Cardiology | Admitting: Cardiology

## 2021-02-28 ENCOUNTER — Other Ambulatory Visit: Payer: Self-pay

## 2021-02-28 ENCOUNTER — Ambulatory Visit (HOSPITAL_COMMUNITY)
Admission: RE | Admit: 2021-02-28 | Discharge: 2021-02-28 | Disposition: A | Discharge status: Home / Self Care | Payer: BC Managed Care – PPO | Source: Ambulatory Visit

## 2021-02-28 DIAGNOSIS — R188 Other ascites: Secondary | ICD-10-CM

## 2021-02-28 DIAGNOSIS — I509 Heart failure, unspecified: Secondary | ICD-10-CM | POA: Insufficient documentation

## 2021-02-28 DIAGNOSIS — I50812 Chronic right heart failure: Secondary | ICD-10-CM | POA: Diagnosis present

## 2021-02-28 DIAGNOSIS — K824 Cholesterolosis of gallbladder: Secondary | ICD-10-CM | POA: Diagnosis not present

## 2021-02-28 DIAGNOSIS — I272 Pulmonary hypertension, unspecified: Secondary | ICD-10-CM | POA: Diagnosis not present

## 2021-02-28 DIAGNOSIS — J984 Other disorders of lung: Secondary | ICD-10-CM | POA: Diagnosis not present

## 2021-02-28 LAB — BASIC METABOLIC PANEL
Anion gap: 8 (ref 5–15)
BUN: 6 mg/dL (ref 6–20)
CO2: 42 mmol/L — ABNORMAL HIGH (ref 22–32)
Calcium: 8.9 mg/dL (ref 8.9–10.3)
Chloride: 90 mmol/L — ABNORMAL LOW (ref 98–111)
Creatinine, Ser: 0.76 mg/dL (ref 0.61–1.24)
GFR, Estimated: >60 mL/min (ref >60)
Glucose, Bld: 101 mg/dL — ABNORMAL HIGH (ref 70–99)
Potassium: 4 mmol/L (ref 3.5–5.1)
Sodium: 140 mmol/L (ref 135–145)

## 2021-02-28 LAB — ECHOCARDIOGRAM COMPLETE: S' Lateral: 3.6 cm

## 2021-02-28 NOTE — Progress Notes (Signed)
  Echocardiogram 2D Echocardiogram has been performed.  [name] 02/28/2021, 10:25 AM

## 2021-03-13 ENCOUNTER — Telehealth: Payer: Self-pay | Admitting: Internal Medicine

## 2021-03-13 NOTE — Telephone Encounter (Signed)
Paper work has been placed in MR sign papers.  Will forward to EP to follow up on.

## 2021-03-15 NOTE — Telephone Encounter (Signed)
Form done and given to [facility] yesterday I think

## 2021-03-15 NOTE — Telephone Encounter (Signed)
Form was filled out and given back to me by MR and placed for it to be faxed.

## 2021-04-03 ENCOUNTER — Encounter (HOSPITAL_COMMUNITY): Payer: Self-pay

## 2021-04-03 ENCOUNTER — Other Ambulatory Visit: Payer: Self-pay

## 2021-04-03 ENCOUNTER — Emergency Department (HOSPITAL_COMMUNITY): Payer: BC Managed Care – PPO

## 2021-04-03 ENCOUNTER — Telehealth (HOSPITAL_COMMUNITY): Payer: Self-pay | Admitting: *Deleted

## 2021-04-03 ENCOUNTER — Inpatient Hospital Stay (HOSPITAL_COMMUNITY)
Admission: EM | Admit: 2021-04-03 | Discharge: 2021-04-07 | DRG: 193 | Disposition: A | Discharge status: Home / Self Care | Payer: BC Managed Care – PPO | Attending: Internal Medicine | Admitting: Internal Medicine

## 2021-04-03 DIAGNOSIS — J9622 Acute and chronic respiratory failure with hypercapnia: Secondary | ICD-10-CM | POA: Diagnosis present

## 2021-04-03 DIAGNOSIS — Z87891 Personal history of nicotine dependence: Secondary | ICD-10-CM

## 2021-04-03 DIAGNOSIS — J470 Bronchiectasis with acute lower respiratory infection: Secondary | ICD-10-CM | POA: Diagnosis present

## 2021-04-03 DIAGNOSIS — Z79899 Other long term (current) drug therapy: Secondary | ICD-10-CM | POA: Diagnosis not present

## 2021-04-03 DIAGNOSIS — J9621 Acute and chronic respiratory failure with hypoxia: Secondary | ICD-10-CM | POA: Diagnosis present

## 2021-04-03 DIAGNOSIS — J45909 Unspecified asthma, uncomplicated: Secondary | ICD-10-CM | POA: Diagnosis present

## 2021-04-03 DIAGNOSIS — R0602 Shortness of breath: Secondary | ICD-10-CM | POA: Diagnosis present

## 2021-04-03 DIAGNOSIS — J9601 Acute respiratory failure with hypoxia: Secondary | ICD-10-CM | POA: Diagnosis not present

## 2021-04-03 DIAGNOSIS — J9602 Acute respiratory failure with hypercapnia: Secondary | ICD-10-CM | POA: Diagnosis not present

## 2021-04-03 DIAGNOSIS — J189 Pneumonia, unspecified organism: Principal | ICD-10-CM

## 2021-04-03 DIAGNOSIS — I5032 Chronic diastolic (congestive) heart failure: Secondary | ICD-10-CM | POA: Diagnosis present

## 2021-04-03 DIAGNOSIS — K219 Gastro-esophageal reflux disease without esophagitis: Secondary | ICD-10-CM | POA: Diagnosis present

## 2021-04-03 DIAGNOSIS — J984 Other disorders of lung: Secondary | ICD-10-CM | POA: Diagnosis not present

## 2021-04-03 DIAGNOSIS — Z20822 Contact with and (suspected) exposure to covid-19: Secondary | ICD-10-CM | POA: Diagnosis present

## 2021-04-03 DIAGNOSIS — I272 Pulmonary hypertension, unspecified: Secondary | ICD-10-CM | POA: Diagnosis not present

## 2021-04-03 DIAGNOSIS — Q676 Pectus excavatum: Secondary | ICD-10-CM

## 2021-04-03 DIAGNOSIS — I110 Hypertensive heart disease with heart failure: Secondary | ICD-10-CM | POA: Diagnosis present

## 2021-04-03 DIAGNOSIS — Z8249 Family history of ischemic heart disease and other diseases of the circulatory system: Secondary | ICD-10-CM | POA: Diagnosis not present

## 2021-04-03 DIAGNOSIS — K7460 Unspecified cirrhosis of liver: Secondary | ICD-10-CM | POA: Diagnosis present

## 2021-04-03 LAB — BASIC METABOLIC PANEL
Anion gap: 9 (ref 5–15)
BUN: 8 mg/dL (ref 6–20)
CO2: 42 mmol/L — ABNORMAL HIGH (ref 22–32)
Calcium: 8.2 mg/dL — ABNORMAL LOW (ref 8.9–10.3)
Chloride: 89 mmol/L — ABNORMAL LOW (ref 98–111)
Creatinine, Ser: 0.7 mg/dL (ref 0.61–1.24)
GFR, Estimated: >60 mL/min (ref >60)
Glucose, Bld: 128 mg/dL — ABNORMAL HIGH (ref 70–99)
Potassium: 4.1 mmol/L (ref 3.5–5.1)
Sodium: 140 mmol/L (ref 135–145)

## 2021-04-03 LAB — CBC
HCT: 44.7 % (ref 39.0–52.0)
Hemoglobin: 13.3 g/dL (ref 13.0–17.0)
MCH: 29.3 pg (ref 26.0–34.0)
MCHC: 29.8 g/dL — ABNORMAL LOW (ref 30.0–36.0)
MCV: 98.5 fL (ref 80.0–100.0)
Platelets: 174 10*3/uL (ref 150–400)
RBC: 4.54 MIL/uL (ref 4.22–5.81)
RDW: 14.5 % (ref 11.5–15.5)
WBC: 10.5 10*3/uL (ref 4.0–10.5)
nRBC: 0 % (ref 0.0–0.2)

## 2021-04-03 LAB — HEPATIC FUNCTION PANEL
ALT: 33 U/L (ref 0–44)
AST: 24 U/L (ref 15–41)
Albumin: 4.4 g/dL (ref 3.5–5.0)
Alkaline Phosphatase: 85 U/L (ref 38–126)
Bilirubin, Direct: 0.1 mg/dL (ref 0.0–0.2)
Indirect Bilirubin: 0.5 mg/dL (ref 0.3–0.9)
Total Bilirubin: 0.6 mg/dL (ref 0.3–1.2)
Total Protein: 8.3 g/dL — ABNORMAL HIGH (ref 6.5–8.1)

## 2021-04-03 LAB — RAPID URINE DRUG SCREEN, HOSP PERFORMED
Amphetamines: NOT DETECTED
Barbiturates: NOT DETECTED
Benzodiazepines: NOT DETECTED
Cocaine: NOT DETECTED
Opiates: NOT DETECTED
Tetrahydrocannabinol: NOT DETECTED

## 2021-04-03 LAB — SARS CORONAVIRUS 2 (TAT 6-24 HRS): SARS Coronavirus 2: NEGATIVE

## 2021-04-03 LAB — TROPONIN I (HIGH SENSITIVITY)
Troponin I (High Sensitivity): 4 ng/L (ref <18)
Troponin I (High Sensitivity): 4 ng/L (ref <18)

## 2021-04-03 LAB — RESP PANEL BY RT-PCR (FLU A&B, COVID) ARPGX2
Influenza A by PCR: NEGATIVE
Influenza B by PCR: NEGATIVE
SARS Coronavirus 2 by RT PCR: NEGATIVE

## 2021-04-03 LAB — BRAIN NATRIURETIC PEPTIDE: B Natriuretic Peptide: 39.6 pg/mL (ref 0.0–100.0)

## 2021-04-03 MED ORDER — SPIRONOLACTONE 25 MG PO TABS
25.0000 mg | ORAL_TABLET | Freq: Every day | ORAL | Status: DC
  Administered 2021-04-04 – 2021-04-07 (×4): 25 mg via ORAL
  Filled 2021-04-03 (×5): qty 1

## 2021-04-03 MED ORDER — ACETAMINOPHEN 650 MG RE SUPP: 650.0000 mg | Freq: Four times a day (QID) | RECTAL | Status: DC | PRN

## 2021-04-03 MED ORDER — FLUOXETINE HCL 20 MG PO CAPS
20.0000 mg | ORAL_CAPSULE | Freq: Every day | ORAL | Status: DC
  Administered 2021-04-04 – 2021-04-07 (×4): 20 mg via ORAL
  Filled 2021-04-03 (×4): qty 1

## 2021-04-03 MED ORDER — ISOSORBIDE MONONITRATE ER 30 MG PO TB24
30.0000 mg | ORAL_TABLET | Freq: Every day | ORAL | Status: DC
  Administered 2021-04-04 – 2021-04-07 (×4): 30 mg via ORAL
  Filled 2021-04-03 (×4): qty 1

## 2021-04-03 MED ORDER — ENOXAPARIN SODIUM 40 MG/0.4ML IJ SOSY
40.0000 mg | PREFILLED_SYRINGE | INTRAMUSCULAR | Status: DC
  Administered 2021-04-04 – 2021-04-06 (×3): 40 mg via SUBCUTANEOUS
  Filled 2021-04-03 (×3): qty 0.4

## 2021-04-03 MED ORDER — POTASSIUM CHLORIDE CRYS ER 20 MEQ PO TBCR
40.0000 meq | EXTENDED_RELEASE_TABLET | Freq: Two times a day (BID) | ORAL | Status: DC
  Administered 2021-04-04 – 2021-04-07 (×7): 40 meq via ORAL
  Filled 2021-04-03 (×7): qty 2

## 2021-04-03 MED ORDER — BUSPIRONE HCL 10 MG PO TABS
15.0000 mg | ORAL_TABLET | Freq: Three times a day (TID) | ORAL | Status: DC
  Administered 2021-04-04 – 2021-04-07 (×9): 15 mg via ORAL
  Filled 2021-04-03 (×10): qty 2

## 2021-04-03 MED ORDER — ALBUTEROL (5 MG/ML) CONTINUOUS INHALATION SOLN
10.0000 mg/h | INHALATION_SOLUTION | RESPIRATORY_TRACT | Status: DC
  Administered 2021-04-03: 10 mg/h via RESPIRATORY_TRACT
  Filled 2021-04-03: qty 20

## 2021-04-03 MED ORDER — FUROSEMIDE 20 MG PO TABS
20.0000 mg | ORAL_TABLET | Freq: Every evening | ORAL | Status: DC
  Administered 2021-04-04: 20 mg via ORAL
  Filled 2021-04-03: qty 1

## 2021-04-03 MED ORDER — POTASSIUM CHLORIDE ER 20 MEQ PO TBCR: 40.0000 meq | EXTENDED_RELEASE_TABLET | Freq: Every day | ORAL | Status: DC

## 2021-04-03 MED ORDER — SODIUM CHLORIDE 0.9 % IV SOLN
500.0000 mg | Freq: Once | INTRAVENOUS | Status: AC
  Administered 2021-04-03: 500 mg via INTRAVENOUS
  Filled 2021-04-03: qty 500

## 2021-04-03 MED ORDER — METHYLPREDNISOLONE SODIUM SUCC 125 MG IJ SOLR
125.0000 mg | Freq: Once | INTRAMUSCULAR | Status: AC
  Administered 2021-04-03: 125 mg via INTRAVENOUS
  Filled 2021-04-03: qty 2

## 2021-04-03 MED ORDER — ACETAMINOPHEN 325 MG PO TABS
650.0000 mg | ORAL_TABLET | Freq: Four times a day (QID) | ORAL | Status: DC | PRN
  Administered 2021-04-05 – 2021-04-07 (×4): 650 mg via ORAL
  Filled 2021-04-03 (×6): qty 2

## 2021-04-03 MED ORDER — BISOPROLOL FUMARATE 5 MG PO TABS
5.0000 mg | ORAL_TABLET | Freq: Every day | ORAL | Status: DC
  Administered 2021-04-04 – 2021-04-07 (×4): 5 mg via ORAL
  Filled 2021-04-03 (×5): qty 1

## 2021-04-03 MED ORDER — PANTOPRAZOLE SODIUM 40 MG PO TBEC
40.0000 mg | DELAYED_RELEASE_TABLET | Freq: Every day | ORAL | Status: DC
  Administered 2021-04-04 – 2021-04-07 (×4): 40 mg via ORAL
  Filled 2021-04-03 (×3): qty 1
  Filled 2021-04-03: qty 2

## 2021-04-03 MED ORDER — MIRTAZAPINE 15 MG PO TABS
15.0000 mg | ORAL_TABLET | Freq: Every day | ORAL | Status: DC
  Administered 2021-04-04 – 2021-04-06 (×3): 15 mg via ORAL
  Filled 2021-04-03 (×5): qty 1

## 2021-04-03 MED ORDER — FUROSEMIDE 40 MG PO TABS
40.0000 mg | ORAL_TABLET | Freq: Every day | ORAL | Status: DC
  Administered 2021-04-04 – 2021-04-05 (×2): 40 mg via ORAL
  Filled 2021-04-03: qty 1
  Filled 2021-04-03: qty 2

## 2021-04-03 MED ORDER — IPRATROPIUM-ALBUTEROL 0.5-2.5 (3) MG/3ML IN SOLN
3.0000 mL | Freq: Once | RESPIRATORY_TRACT | Status: AC
  Administered 2021-04-03: 3 mL via RESPIRATORY_TRACT
  Filled 2021-04-03: qty 3

## 2021-04-03 MED ORDER — ALBUTEROL SULFATE (2.5 MG/3ML) 0.083% IN NEBU: 2.5000 mg | INHALATION_SOLUTION | RESPIRATORY_TRACT | Status: DC | PRN

## 2021-04-03 MED ORDER — SODIUM CHLORIDE 0.9 % IV SOLN
1.0000 g | Freq: Once | INTRAVENOUS | Status: AC
  Administered 2021-04-03: 1 g via INTRAVENOUS
  Filled 2021-04-03: qty 10

## 2021-04-03 MED ORDER — IOHEXOL 300 MG/ML  SOLN
75.0000 mL | Freq: Once | INTRAMUSCULAR | Status: AC | PRN
  Administered 2021-04-03: 75 mL via INTRAVENOUS

## 2021-04-03 MED ORDER — OMEPRAZOLE MAGNESIUM 20 MG PO TBEC: 20.0000 mg | DELAYED_RELEASE_TABLET | Freq: Every day | ORAL | Status: DC

## 2021-04-03 MED ORDER — SODIUM CHLORIDE 0.9 % IV SOLN
2.0000 g | INTRAVENOUS | Status: DC
  Administered 2021-04-04 – 2021-04-06 (×3): 2 g via INTRAVENOUS
  Filled 2021-04-03: qty 20
  Filled 2021-04-03 (×2): qty 2

## 2021-04-03 MED ORDER — SODIUM CHLORIDE 0.9 % IV SOLN
500.0000 mg | INTRAVENOUS | Status: DC
  Administered 2021-04-04: 500 mg via INTRAVENOUS
  Filled 2021-04-03 (×3): qty 500

## 2021-04-03 MED ORDER — ALBUTEROL SULFATE (2.5 MG/3ML) 0.083% IN NEBU
2.5000 mg | INHALATION_SOLUTION | Freq: Four times a day (QID) | RESPIRATORY_TRACT | Status: DC
  Administered 2021-04-04 – 2021-04-06 (×10): 2.5 mg via RESPIRATORY_TRACT
  Filled 2021-04-03 (×10): qty 3

## 2021-04-03 NOTE — Progress Notes (Signed)
Pt. Placed back on bipap due to Co2 remaining high. MD aware.

## 2021-04-03 NOTE — ED Triage Notes (Signed)
Pt reports he is here today due to increase sob,headache.Pt reports that at baseline he requests 5L. Pt reports now he is requiring 8L.Pt reports he also had an episode of tachycardia.Pt reports he sp02 last night was in the 60%. On 5 L.

## 2021-04-03 NOTE — H&P (Signed)
History and Physical    [name] [id number] DOB: Aug 15, 1981 DOA: 04/03/2021  PCP: [name], PA-C  Patient coming from: Home.  Chief Complaint: Shortness of breath.  HPI: [name] is a 40 y.o. male with history of chronic hypercarbic respiratory failure requiring BiPAP at bedtime with history of chronic diastolic CHF and pulmonary hypertension and cirrhosis of the liver with history of pectus excavatum requiring surgery presents to the ER with complaints of shortness of breath.  As per the patient and his wife patient has been getting progressively short of breath over the last few weeks.  Shortness of breath is present even at rest increased on exertion with some nonproductive cough denies any chest pain.  Today patient had been sleeping and more obtunded and EMS was called and brought to the ER.  ED Course: In the ER patient initially was obtunded and ABG showed pH of 7.165 with PCO2 of more than 110.  Patient was placed on BiPAP after which patient became more alert awake.  Repeat ABG showed pH of 7.25 with PCO2 still more than 110.  I did discuss with on-call pulmonary critical care who advised that since patient's symptoms are improving despite PCO2 being more than 110 and pH is improving to continue on BiPAP for now and will be seeing patient later.  CT chest shows bilateral consolidation for which patient was started on antibiotics.  BNP and high since troponins were negative.  COVID test was negative.  Review of Systems: As per HPI, rest all negative.   Past Medical History:  Diagnosis Date   Asthma    CHF (congestive heart failure) (HCC)    Chronic hypercapnic respiratory failure (HCC)    nocturnal  hypoxemic resp failure, on bipap   Pectus excavatum    Restrictive lung disease     Past Surgical History:  Procedure Laterality Date   KNEE SURGERY     right   PECTUS EXCAVATUM REPAIR     RIGHT HEART CATH N/A 04/21/2019   Procedure: RIGHT HEART  CATH;  Surgeon: Larey Dresser, MD;  Location: [facility];  Service: Cardiovascular;  Laterality: N/A;     reports that he quit smoking about 14 years ago. His smoking use included cigarettes. He has a 0.10 pack-year smoking history. He has never used smokeless tobacco. He reports that he does not drink alcohol and does not use drugs.  Allergies  Allergen Reactions   Other Itching and Other (See Comments)    Pet Dander    Family History  Problem Relation Age of Onset   Hypertension Mother    Heart attack Neg Hx    Stroke Neg Hx     Prior to Admission medications   Medication Sig Start Date End Date Taking? Authorizing Provider  acetaminophen (TYLENOL) 500 MG tablet Take 1,000 mg by mouth every 6 (six) hours as needed for mild pain.   Yes [provider]  albuterol (PROAIR HFA) 108 (90 BASE) MCG/ACT inhaler Inhale 2 puffs into the lungs every 6 (six) hours as needed for wheezing or shortness of breath. 04/29/14  Yes Clance, Armando Reichert, MD  bisoprolol (ZEBETA) 5 MG tablet TAKE 1 TABLET BY MOUTH EVERY DAY Patient taking differently: Take 5 mg by mouth daily. 12/07/20  Yes Larey Dresser, MD  busPIRone (BUSPAR) 15 MG tablet Take 15 mg by mouth 3 (three) times daily. 03/27/21  Yes [provider]  cetirizine (ZYRTEC) 10 MG tablet Take  1 tablet (10 mg total) by mouth daily. 08/18/12  Yes Clance, Armando Reichert, MD  dapagliflozin propanediol (FARXIGA) 10 MG TABS tablet Take 1 tablet (10 mg total) by mouth daily before breakfast. 02/14/21  Yes Larey Dresser, MD  FLUoxetine (PROZAC) 20 MG capsule Take 20 mg by mouth daily. 07/19/20  Yes [provider]  furosemide (LASIX) 20 MG tablet TAKE 2 TABLETS (40 MG TOTAL) BY MOUTH EVERY MORNING AND 1 TABLET (20 MG TOTAL) EVERY EVENING. NEEDS DOCTOR APPOINTMENT FOR FURTHER REFILL.Marland Kitchen 11/14/20  Yes Larey Dresser, MD  isosorbide mononitrate (IMDUR) 30 MG 24 hr tablet TAKE 1 TABLET (30 MG TOTAL) BY MOUTH DAILY. NEED APPT FOR FUTURE  REFILLS [phone] Patient taking differently: Take 30 mg by mouth daily. 04/05/20  Yes Bensimhon, Shaune Pascal, MD  Melatonin 10 MG TABS Take 10 mg by mouth daily as needed (sleep).   Yes [provider]  mirtazapine (REMERON) 15 MG tablet Take 15 mg by mouth at bedtime. 07/19/20  Yes [provider]  omeprazole (PRILOSEC OTC) 20 MG tablet Take 20 mg by mouth daily.   Yes [provider]  Potassium Chloride ER 20 MEQ TBCR TAKE 2 TABLETS BY MOUTH EVERY DAY Patient taking differently: Take 40 mEq by mouth daily. 12/07/20  Yes Larey Dresser, MD  spironolactone (ALDACTONE) 25 MG tablet TAKE 1 TABLET BY MOUTH EVERY DAY Patient taking differently: Take 25 mg by mouth daily. 12/07/20  Yes Larey Dresser, MD    Physical Exam: Constitutional: Moderately built and nourished. Vitals:   04/03/21 2100 04/03/21 2110 04/03/21 2130 04/03/21 2145  BP: 129/79  134/80   Pulse: (!) 102  96   Resp: (!) 23  19   Temp:      TempSrc:      SpO2: 98% 98% 90% 95%   Eyes: Anicteric no pallor. ENMT: No discharge from the ears eyes nose and mouth. Neck: No mass felt.  No neck rigidity.  No JVD appreciated. Respiratory: No rhonchi or crepitations. Cardiovascular: S1-S2 heard. Abdomen: Soft nontender bowel sound present. Musculoskeletal: No edema. Skin: No rash. Neurologic: Alert awake oriented time place and person.  Moves all extremities. Psychiatric: Appears normal.  Normal affect.   Labs on Admission: I have personally reviewed following labs and imaging studies  CBC: Recent Labs  Lab 04/03/21 1400  WBC 10.5  HGB 13.3  HCT 44.7  MCV 98.5  PLT [id number]   Basic Metabolic Panel: Recent Labs  Lab 04/03/21 1220  NA 140  K 4.1  CL 89*  CO2 42*  GLUCOSE 128*  BUN 8  CREATININE 0.70  CALCIUM 8.2*   GFR: CrCl cannot be calculated (Unknown ideal weight.). Liver Function Tests: Recent Labs  Lab 04/03/21 1728  AST 24  ALT 33  ALKPHOS 85  BILITOT 0.6  PROT 8.3*   ALBUMIN 4.4   No results for input(s): LIPASE, AMYLASE in the last 168 hours. No results for input(s): AMMONIA in the last 168 hours. Coagulation Profile: No results for input(s): INR, PROTIME in the last 168 hours. Cardiac Enzymes: No results for input(s): CKTOTAL, CKMB, CKMBINDEX, TROPONINI in the last 168 hours. BNP (last 3 results) No results for input(s): PROBNP in the last 8760 hours. HbA1C: No results for input(s): HGBA1C in the last 72 hours. CBG: No results for input(s): GLUCAP in the last 168 hours. Lipid Profile: No results for input(s): CHOL, HDL, LDLCALC, TRIG, CHOLHDL, LDLDIRECT in the last 72 hours. Thyroid Function Tests: No results for input(s):  TSH, T4TOTAL, FREET4, T3FREE, THYROIDAB in the last 72 hours. Anemia Panel: No results for input(s): VITAMINB12, FOLATE, FERRITIN, TIBC, IRON, RETICCTPCT in the last 72 hours. Urine analysis: No results found for: COLORURINE, APPEARANCEUR, LABSPEC, PHURINE, GLUCOSEU, HGBUR, BILIRUBINUR, KETONESUR, PROTEINUR, UROBILINOGEN, NITRITE, LEUKOCYTESUR Sepsis Labs: '@LABRCNTIP'$ (procalcitonin:4,lacticidven:4) )No results found for this or any previous visit (from the past 240 hour(s)).   Radiological Exams on Admission: DG Chest 2 View  Result Date: 04/03/2021 CLINICAL DATA:  Shortness of breath.  History of asthma. EXAM: CHEST - 2 VIEW COMPARISON:  09/29/2019. FINDINGS: New/increased consolidation in the left greater than right perihilar regions and left lung base, which is superimposed on chronic bilateral perihilar opacities. Enlarged cardiac silhouette that is partly obscured. Central pulmonary vascular dilation. No visible pleural effusions or pneumothorax. Chronic pectus deformity. Markedly dilated stomach, partially imaged. IMPRESSION: 1. New/increased left greater than right perihilar and left basilar consolidation, which is superimposed on chronic perihilar opacities. 2. Chronic cardiomegaly and dilated central pulmonary  vasculature. 3. Markedly dilated stomach, partially imaged. Considered decompression. Electronically Signed   By: Margaretha Sheffield MD   On: 04/03/2021 13:43   CT Head Wo Contrast  Result Date: 04/03/2021 CLINICAL DATA:  40 year old male with headache. EXAM: CT HEAD WITHOUT CONTRAST TECHNIQUE: Contiguous axial images were obtained from the base of the skull through the vertex without intravenous contrast. COMPARISON:  CT of the paranasal sinuses dated 08/07/2011. FINDINGS: Brain: The ventricles and sulci are appropriate size for patient's age. The gray-white matter discrimination is preserved. There is no acute intracranial hemorrhage. No mass effect or midline shift no extra-axial fluid collection. Vascular: No hyperdense vessel or unexpected calcification. Skull: Normal. Negative for fracture or focal lesion. Sinuses/Orbits: No acute finding. Other: None IMPRESSION: Unremarkable noncontrast CT of the brain. Electronically Signed   By: Anner Crete M.D.   On: 04/03/2021 21:20   CT Chest W Contrast  Result Date: 04/03/2021 CLINICAL DATA:  Headache and abnormal chest x-ray. EXAM: CT CHEST WITH CONTRAST TECHNIQUE: Multidetector CT imaging of the chest was performed during intravenous contrast administration. CONTRAST:  20m OMNIPAQUE IOHEXOL 300 MG/ML  SOLN COMPARISON:  April 21, 2019 FINDINGS: Cardiovascular: There is stable dilatation of the main pulmonary artery (5.1 cm). The pulmonary arteries are limited in evaluation secondary to suboptimal opacification with intravenous contrast. No intraluminal filling defects are identified. There is marked severity cardiomegaly. Mass effect from an existing pectus deformity is again noted. No pericardial effusion. Mediastinum/Nodes: No enlarged mediastinal, hilar, or axillary lymph nodes. Thyroid gland, trachea, and esophagus demonstrate no significant findings. Lungs/Pleura: Stable moderate to marked severity areas of consolidation and/or atelectasis are seen  within the right middle lobe, right lower lobe and throughout the left lung. Associated bronchiectasis is again seen with stable left-sided volume loss. A stable 7 mm noncalcified lung nodule is seen within the lateral aspect of the right lower lobe. There is no evidence of a pleural effusion or pneumothorax. Upper Abdomen: No acute abnormality. Musculoskeletal: A pectus deformity of the anterior chest wall is seen. IMPRESSION: 1. Stable moderate to marked severity areas of bilateral consolidation and atelectasis, which may represent sequelae associated with a chronic infectious or chronic inflammatory process. 2. Stable 7 mm noncalcified right lower lobe lung nodule. Non-contrast chest CT at 6-12 months is recommended. If the nodule is stable at time of repeat CT, then future CT at 18-24 months (from today's scan) is considered optional for low-risk patients, but is recommended for high-risk patients. This recommendation follows the consensus statement: Guidelines for  Management of Incidental Pulmonary Nodules Detected on CT Images: From the Fleischner Society 2017; Radiology 2017; 284:228-243. 3. Pectus excavatum deformity with subsequent mass effect on the heart. 4. Stable central pulmonary arterial enlargement consistent with pulmonary arterial hypertension. Electronically Signed   By: Virgina Norfolk M.D.   On: 04/03/2021 21:31    EKG: Independently reviewed.  Sinus tachycardia right bundle branch block.  Assessment/Plan Principal Problem:   Acute respiratory failure with hypoxia and hypercapnia (HCC) Active Problems:   Pulmonary hypertension (HCC)   Restrictive lung disease   Chronic diastolic CHF (congestive heart failure) (HCC)   Pectus excavatum   CAP (community acquired pneumonia)    Acute respiratory failure with hypoxia and hypercarbia likely could have been respiratory by pneumonia with history of restrictive lung disease and pectus excavatum for which patient is on antibiotics and we  will continue with BiPAP as discussed with pulmonary critical care.  We will repeat ABG in another few hours.  Patient mental status has considerably improved.  We will also continue patient's Lasix which patient takes for CHF and pulmonary hypertension. History of chronic diastolic CHF and pulmonary hypertension being followed by Dr Aundra Dubin cardiologist.  Last EF was 50 to 55% in February 28, 2021.  On Lasix 40 mg in the morning and 20 mg in the evening with spironolactone and Jardiance. History of asthma presently not wheezing.  We will continue nebulizer as needed. History of cirrhosis.  Since patient has hypercarbic and hypoxic respiratory failure with possible pneumonia with history of hypertension we will need close monitoring and inpatient status.   DVT prophylaxis: Lovenox. Code Status: Full code. Family Communication: Patient's wife. Disposition Plan: Home. Consults called: Pulmonary critical care. Admission status: Inpatient.   Rise Patience MD [facility] Pager [phone].  If 7PM-7AM, please contact night-coverage [email or web address] Password [facility]  04/03/2021, 10:08 PM

## 2021-04-03 NOTE — ED Provider Notes (Signed)
Emergency Medicine Provider Triage Evaluation Note  [name] , a 40 y.o. male  was evaluated in triage.  Pt complains of h/o SOB, increasing O2 requirement over the past 2 days. States O2 was in 60's last night. Compliant with diuretic. Cardiologist Dr. Aundra Dubin.  Review of Systems  Positive: SOB, HA Negative: fever  Physical Exam  BP (!) 143/81   Pulse 98   Temp 98 F (36.7 C) (Oral)   Resp 16   SpO2 100%  Gen:   Awake, no distress   Resp:  Normal effort, crackles noted MSK:   Moves extremities without difficulty  Other:    Medical Decision Making  Medically screening exam initiated at 12:01 PM.  Appropriate orders placed.  [name] was informed that the remainder of the evaluation will be completed by another provider, this initial triage assessment does not replace that evaluation, and the importance of remaining in the ED until their evaluation is complete.     Carlisle Cater, PA-C 04/03/21 1202    Lajean Saver, MD 04/05/21 1311

## 2021-04-03 NOTE — Telephone Encounter (Signed)
pt called stating his hands are very shaky, he feels, terrible, using 5L of O2 94%, heart rate 103, c/o bad headache can hear his heart beating in his ears and head. Message sent to Dr.McLean to advise.   Pts mother called stating pt looks bad and he needs to go to the emergency room she wanted to notify her office she will be bringing him to the ED.

## 2021-04-03 NOTE — ED Provider Notes (Signed)
[facility] EMERGENCY DEPARTMENT Provider Note   CSN: [id number] Arrival date & time: 04/03/21  1152     History Chief Complaint  Patient presents with   Shortness of Breath   Headache    [name] is a 40 y.o. male.   Shortness of Breath Associated symptoms: headaches   Headache     Past Medical History:  Diagnosis Date   Asthma    CHF (congestive heart failure) (HCC)    Chronic hypercapnic respiratory failure (HCC)    nocturnal  hypoxemic resp failure, on bipap   Pectus excavatum    Restrictive lung disease     Patient Active Problem List   Diagnosis Date Noted   Acute respiratory failure with hypoxia and hypercapnia ([place]) 04/03/2021   CAP (community acquired pneumonia) 04/03/2021   Pectus excavatum 05/11/2019   Left upper lobe pneumonia 05/11/2019   Neuropathy of left hand 05/11/2019   Acute respiratory failure with hypoxia ([place]) 04/21/2019   Chronic right-sided CHF (congestive heart failure) ([place]) 04/20/2019   Right heart failure ([place]) 04/20/2019   Dyspnea 03/22/2019   Bloating 12/11/2017   Gastroesophageal reflux disease 12/11/2017   Asthmatic bronchitis 10/14/2016   Chronic diastolic CHF (congestive heart failure) ([place]) 01/23/2015   Chronic respiratory failure with hypoxia and hypercapnia ([place]) 01/06/2015   Sinusitis, chronic 08/13/2012   Restrictive lung disease 03/05/2009   Pulmonary hypertension ([place]) 02/24/2009   Congestive heart failure ([place]) 02/24/2009   CHEST PAIN UNSPECIFIED 02/24/2009   Moderate persistent asthma in adult without complication [id number]    Past Surgical History:  Procedure Laterality Date   KNEE SURGERY     right   PECTUS EXCAVATUM REPAIR     RIGHT HEART CATH N/A 04/21/2019   Procedure: RIGHT HEART CATH;  Surgeon: Larey Dresser, MD;  Location: [facility];  Service: Cardiovascular;  Laterality: N/A;       Family History  Problem Relation Age of Onset   Hypertension Mother     Heart attack Neg Hx    Stroke Neg Hx     Social History   Tobacco Use   Smoking status: Former    Packs/day: 0.10    Years: 1.00    Pack years: 0.10    Types: Cigarettes    Quit date: 09/09/2006    Years since quitting: 14.5   Smokeless tobacco: Never  Vaping Use   Vaping Use: Never used  Substance Use Topics   Alcohol use: No   Drug use: No    Home Medications Prior to Admission medications   Medication Sig Start Date End Date Taking? Authorizing Provider  acetaminophen (TYLENOL) 500 MG tablet Take 1,000 mg by mouth every 6 (six) hours as needed for mild pain.   Yes [provider]  albuterol (PROAIR HFA) 108 (90 BASE) MCG/ACT inhaler Inhale 2 puffs into the lungs every 6 (six) hours as needed for wheezing or shortness of breath. 04/29/14  Yes Clance, Armando Reichert, MD  bisoprolol (ZEBETA) 5 MG tablet TAKE 1 TABLET BY MOUTH EVERY DAY Patient taking differently: Take 5 mg by mouth daily. 12/07/20  Yes Larey Dresser, MD  busPIRone (BUSPAR) 15 MG tablet Take 15 mg by mouth 3 (three) times daily. 03/27/21  Yes [provider]  cetirizine (ZYRTEC) 10 MG tablet Take 1 tablet (10 mg total) by mouth daily. 08/18/12  Yes Clance, Armando Reichert, MD  dapagliflozin propanediol (FARXIGA) 10 MG TABS tablet Take 1 tablet (10 mg total) by mouth  daily before breakfast. 02/14/21  Yes Larey Dresser, MD  FLUoxetine (PROZAC) 20 MG capsule Take 20 mg by mouth daily. 07/19/20  Yes [provider]  furosemide (LASIX) 20 MG tablet TAKE 2 TABLETS (40 MG TOTAL) BY MOUTH EVERY MORNING AND 1 TABLET (20 MG TOTAL) EVERY EVENING. NEEDS DOCTOR APPOINTMENT FOR FURTHER REFILL.Marland Kitchen 11/14/20  Yes Larey Dresser, MD  isosorbide mononitrate (IMDUR) 30 MG 24 hr tablet TAKE 1 TABLET (30 MG TOTAL) BY MOUTH DAILY. NEED APPT FOR FUTURE REFILLS [phone] Patient taking differently: Take 30 mg by mouth daily. 04/05/20  Yes Bensimhon, Shaune Pascal, MD  Melatonin 10 MG TABS Take 10 mg by mouth daily as needed (sleep).    Yes [provider]  mirtazapine (REMERON) 15 MG tablet Take 15 mg by mouth at bedtime. 07/19/20  Yes [provider]  omeprazole (PRILOSEC OTC) 20 MG tablet Take 20 mg by mouth daily.   Yes [provider]  Potassium Chloride ER 20 MEQ TBCR TAKE 2 TABLETS BY MOUTH EVERY DAY Patient taking differently: Take 40 mEq by mouth daily. 12/07/20  Yes Larey Dresser, MD  spironolactone (ALDACTONE) 25 MG tablet TAKE 1 TABLET BY MOUTH EVERY DAY Patient taking differently: Take 25 mg by mouth daily. 12/07/20  Yes Larey Dresser, MD    Allergies    Other  Review of Systems   Review of Systems  Respiratory:  Positive for shortness of breath.   Neurological:  Positive for headaches.   Physical Exam Updated Vital Signs BP 140/85 (BP Location: Left Arm)   Pulse 94   Temp 98.1 F (36.7 C) (Oral)   Resp 16   SpO2 99%   Physical Exam Vitals and nursing note reviewed.  Constitutional:      General: He is in acute distress.     Appearance: He is well-developed. He is ill-appearing. He is not diaphoretic.     Comments: Patient is very somnolent and ill-appearing, minimally responsive and in acute distress  HENT:     Head: Normocephalic and atraumatic.     Mouth/Throat:     Mouth: Mucous membranes are moist.     Pharynx: Oropharynx is clear.  Eyes:     General:        Right eye: No discharge.        Left eye: No discharge.     Comments: Pupils are dilated bilaterally but responsive to light  Cardiovascular:     Rate and Rhythm: Normal rate and regular rhythm.     Pulses: Normal pulses.     Heart sounds: Normal heart sounds. No murmur heard.   No friction rub. No gallop.  Pulmonary:     Comments: Patient is tachypneic with respiratory rate in the 30s, increased work of breathing, somnolent and only able to say a few words before falling back asleep, on auscultation patient has very diminished air movement in all lung fields, no wheezes or rhonchi noted, no  crackles Abdominal:     General: Bowel sounds are normal. There is no distension.     Palpations: Abdomen is soft. There is no mass.     Tenderness: There is no abdominal tenderness. There is no guarding.     Comments: Abdomen soft, nondistended, nontender to palpation in all quadrants without guarding or peritoneal signs  Musculoskeletal:        General: No deformity.     Cervical back: Neck supple.     Right lower leg: No edema.  Left lower leg: No edema.     Comments: No edema noted bilaterally  Skin:    General: Skin is warm and dry.     Capillary Refill: Capillary refill takes less than 2 seconds.  Neurological:     Mental Status: He is oriented to person, place, and time.     Coordination: Coordination normal.     Comments: Obtunded and having difficulty following commands Moves extremities without ataxia, coordination intact  Psychiatric:        Mood and Affect: Mood normal.        Behavior: Behavior normal.    ED Results / Procedures / Treatments   Labs (all labs ordered are listed, but only abnormal results are displayed) Labs Reviewed  BASIC METABOLIC PANEL - Abnormal; Notable for the following components:      Result Value   Chloride 89 (*)    CO2 42 (*)    Glucose, Bld 128 (*)    Calcium 8.2 (*)    All other components within normal limits  CBC - Abnormal; Notable for the following components:   MCHC 29.8 (*)    All other components within normal limits  HEPATIC FUNCTION PANEL - Abnormal; Notable for the following components:   Total Protein 8.3 (*)    All other components within normal limits  RESP PANEL BY RT-PCR (FLU A&B, COVID) [id number]  SARS CORONAVIRUS 2 (TAT 6-24 HRS)  CULTURE, BLOOD (ROUTINE X 2)  CULTURE, BLOOD (ROUTINE X 2)  BRAIN NATRIURETIC PEPTIDE  RAPID URINE DRUG SCREEN, HOSP PERFORMED  HIV ANTIBODY (ROUTINE TESTING W REFLEX)  CBC  CREATININE, SERUM  BASIC METABOLIC PANEL  CBC  I-STAT ARTERIAL BLOOD GAS, ED  I-STAT ARTERIAL BLOOD GAS,  ED  TROPONIN I (HIGH SENSITIVITY)  TROPONIN I (HIGH SENSITIVITY)    EKG None  Radiology DG Chest 2 View  Result Date: 04/03/2021 CLINICAL DATA:  Shortness of breath.  History of asthma. EXAM: CHEST - 2 VIEW COMPARISON:  09/29/2019. FINDINGS: New/increased consolidation in the left greater than right perihilar regions and left lung base, which is superimposed on chronic bilateral perihilar opacities. Enlarged cardiac silhouette that is partly obscured. Central pulmonary vascular dilation. No visible pleural effusions or pneumothorax. Chronic pectus deformity. Markedly dilated stomach, partially imaged. IMPRESSION: 1. New/increased left greater than right perihilar and left basilar consolidation, which is superimposed on chronic perihilar opacities. 2. Chronic cardiomegaly and dilated central pulmonary vasculature. 3. Markedly dilated stomach, partially imaged. Considered decompression. Electronically Signed   By: Margaretha Sheffield MD   On: 04/03/2021 13:43   CT Head Wo Contrast  Result Date: 04/03/2021 CLINICAL DATA:  40 year old male with headache. EXAM: CT HEAD WITHOUT CONTRAST TECHNIQUE: Contiguous axial images were obtained from the base of the skull through the vertex without intravenous contrast. COMPARISON:  CT of the paranasal sinuses dated 08/07/2011. FINDINGS: Brain: The ventricles and sulci are appropriate size for patient's age. The gray-white matter discrimination is preserved. There is no acute intracranial hemorrhage. No mass effect or midline shift no extra-axial fluid collection. Vascular: No hyperdense vessel or unexpected calcification. Skull: Normal. Negative for fracture or focal lesion. Sinuses/Orbits: No acute finding. Other: None IMPRESSION: Unremarkable noncontrast CT of the brain. Electronically Signed   By: Anner Crete M.D.   On: 04/03/2021 21:20   CT Chest W Contrast  Result Date: 04/03/2021 CLINICAL DATA:  Headache and abnormal chest x-ray. EXAM: CT CHEST WITH  CONTRAST TECHNIQUE: Multidetector CT imaging of the chest was performed during intravenous contrast administration. CONTRAST:  75m OMNIPAQUE IOHEXOL 300 MG/ML  SOLN COMPARISON:  April 21, 2019 FINDINGS: Cardiovascular: There is stable dilatation of the main pulmonary artery (5.1 cm). The pulmonary arteries are limited in evaluation secondary to suboptimal opacification with intravenous contrast. No intraluminal filling defects are identified. There is marked severity cardiomegaly. Mass effect from an existing pectus deformity is again noted. No pericardial effusion. Mediastinum/Nodes: No enlarged mediastinal, hilar, or axillary lymph nodes. Thyroid gland, trachea, and esophagus demonstrate no significant findings. Lungs/Pleura: Stable moderate to marked severity areas of consolidation and/or atelectasis are seen within the right middle lobe, right lower lobe and throughout the left lung. Associated bronchiectasis is again seen with stable left-sided volume loss. A stable 7 mm noncalcified lung nodule is seen within the lateral aspect of the right lower lobe. There is no evidence of a pleural effusion or pneumothorax. Upper Abdomen: No acute abnormality. Musculoskeletal: A pectus deformity of the anterior chest wall is seen. IMPRESSION: 1. Stable moderate to marked severity areas of bilateral consolidation and atelectasis, which may represent sequelae associated with a chronic infectious or chronic inflammatory process. 2. Stable 7 mm noncalcified right lower lobe lung nodule. Non-contrast chest CT at 6-12 months is recommended. If the nodule is stable at time of repeat CT, then future CT at 18-24 months (from today's scan) is considered optional for low-risk patients, but is recommended for high-risk patients. This recommendation follows the consensus statement: Guidelines for Management of Incidental Pulmonary Nodules Detected on CT Images: From the Fleischner Society 2017; Radiology 2017; 284:228-243. 3. Pectus  excavatum deformity with subsequent mass effect on the heart. 4. Stable central pulmonary arterial enlargement consistent with pulmonary arterial hypertension. Electronically Signed   By: TVirgina NorfolkM.D.   On: 04/03/2021 21:31    Procedures .Critical Care  Date/Time: 04/03/2021 6:03 PM Performed by: FJacqlyn Larsen PA-C Authorized by: FJacqlyn Larsen PA-C   Critical care provider statement:    Critical care time (minutes):  45   Critical care was necessary to treat or prevent imminent or life-threatening deterioration of the following conditions:  Respiratory failure   Critical care was time spent personally by me on the following activities:  Discussions with consultants, evaluation of patient's response to treatment, examination of patient, ordering and performing treatments and interventions, ordering and review of laboratory studies, ordering and review of radiographic studies, pulse oximetry, re-evaluation of patient's condition, obtaining history from patient or surrogate and review of old charts   Medications Ordered in ED Medications  bisoprolol (ZEBETA) tablet 5 mg (has no administration in time range)  furosemide (LASIX) tablet 40 mg (has no administration in time range)  isosorbide mononitrate (IMDUR) 24 hr tablet 30 mg (has no administration in time range)  spironolactone (ALDACTONE) tablet 25 mg (has no administration in time range)  FLUoxetine (PROZAC) capsule 20 mg (has no administration in time range)  busPIRone (BUSPAR) tablet 15 mg (has no administration in time range)  mirtazapine (REMERON) tablet 15 mg (15 mg Oral Not Given 04/03/21 2221)  cefTRIAXone (ROCEPHIN) 2 g in sodium chloride 0.9 % 100 mL IVPB (has no administration in time range)  azithromycin (ZITHROMAX) 500 mg in sodium chloride 0.9 % 250 mL IVPB (has no administration in time range)  enoxaparin (LOVENOX) injection 40 mg (has no administration in time range)  acetaminophen (TYLENOL) tablet 650 mg (has no  administration in time range)    Or  acetaminophen (TYLENOL) suppository 650 mg (has no administration in time range)  albuterol (PROVENTIL) (2.5 MG/3ML) 0.083% nebulizer  solution 2.5 mg (2.5 mg Nebulization Not Given 04/03/21 2339)  albuterol (PROVENTIL) (2.5 MG/3ML) 0.083% nebulizer solution 2.5 mg (has no administration in time range)  furosemide (LASIX) tablet 20 mg (20 mg Oral Not Given 04/03/21 2221)  pantoprazole (PROTONIX) EC tablet 40 mg (has no administration in time range)  potassium chloride SA (KLOR-CON) CR tablet 40 mEq (40 mEq Oral Not Given 04/03/21 2221)  cefTRIAXone (ROCEPHIN) 1 g in sodium chloride 0.9 % 100 mL IVPB (0 g Intravenous Stopped 04/03/21 1924)  azithromycin (ZITHROMAX) 500 mg in sodium chloride 0.9 % 250 mL IVPB (0 mg Intravenous Stopped 04/03/21 2028)  methylPREDNISolone sodium succinate (SOLU-MEDROL) 125 mg/2 mL injection 125 mg (125 mg Intravenous Given 04/03/21 1829)  ipratropium-albuterol (DUONEB) 0.5-2.5 (3) MG/3ML nebulizer solution 3 mL (3 mLs Nebulization Given 04/03/21 1802)  iohexol (OMNIPAQUE) 300 MG/ML solution 75 mL (75 mLs Intravenous Contrast Given 04/03/21 2056)    ED Course  I have reviewed the triage vital signs and the nursing notes.  Pertinent labs & imaging results that were available during my care of the patient were reviewed by me and considered in my medical decision making (see chart for details).     MDM Rules/Calculators/A&P                          Called in by nursing staff as soon as patient arrived to her room, he is very somnolent with increased work of breathing.  Per nursing tach that saw him initially in triage he was not somnolent on arrival and was able to talk and provide history himself.  Patient initially presented for worsening shortness of breath and increased oxygen requirement.  Wears 5 L O2 at baseline but over the past 2 days has needed up to 8 L.  Patient was on higher level of oxygen while in the waiting room waiting  for bed and became very somnolent.  On my evaluation he cannot answer my questions and only responds "lets go".  Patient with increased respiratory effort and very diminished air movement throughout.  No significant edema to suggest fluid overload and patient does have heart failure history but is compliant with diuretic and has not had significant weight gain per patient's wife who is now at bedside.  I reviewed patient's lab work that was done from triage: CBC: No leukocytosis and normal hemoglobin BMP: CO2 chronically elevated at baseline at 42 upon arrival, glucose 128, no other significant electrolyte derangements and creatinine at baseline.  High clinical suspicion for hypercapnia in the setting of increased oxygen use leading to altered mental status.  Will get stat ABG. Troponin: Negative x2 BNP: WNL COVID: Pending  Chest x-ray reviewed, concerning for new/increased perihilar and left basilar infiltrates, worsened when compared to previous chest x-rays, chronic cardiomegaly and dilated central vascular congestion.  ABG returned with pH of 7.165 and PCO2 > 110.  On chart review of patient's pulmonology notes it looks like patient has chronic hypercapnia that often ranges between 60-80 on ABGs as an outpatient.  Feel patient will likely need BiPAP given severe hypercapnia and altered mental status.  Will discuss with pulmonology.  Case discussed with NP Marni Griffon with pulmonology agrees with plan for BiPAP, would like to avoid intubation, especially since patient is not too far off from his baseline.  Can be seen by pulmonary critical care if patient not improving on BiPAP.  Patient started on BiPAP with respiratory therapy at bedside also given DuoNeb, Solu-Medrol,  Rocephin and azithromycin for potentially worsening infiltrates on chest x-ray.  Will get CT of the head given altered mental status and pupillary dilation, and CT chest to better characterize acute on chronic infiltrates.  CT  of the head without any acute abnormalities.  Chest CT with areas of bilateral consolidation, some of which appear stable, may represent chronic infectious versus inflammatory process, some pulmonary nodules noted as well.  Repeat ABG with improving pH now 7.250, PCO2 is still elevated, mental status is improving and patient is now more alert, able to talk with me and has significantly improved air movement after nebulizer treatment.  Still having some expiratory wheezing will give continuous albuterol neb.  Respiratory therapy will put patient back on BiPAP to give continuous neb.  Given improvement feel patient will likely be stable for admission to stepdown unit with hospitalist.  Case discussed with Dr. Hal Hope with [facility] hospitalist who will see and admit the patient.  Final Clinical Impression(s) / ED Diagnoses Final diagnoses:  Acute respiratory failure with hypoxia and hypercapnia [facility])    Rx / DC Orders ED Discharge Orders     None        Jacqlyn Larsen, PA-C 04/04/21 0015    Lorelle Gibbs, DO 04/05/21 1513

## 2021-04-03 NOTE — ED Notes (Signed)
Changed pt's O2 tank. Currently on 6L of O2

## 2021-04-04 ENCOUNTER — Encounter (HOSPITAL_COMMUNITY): Payer: Self-pay | Admitting: Internal Medicine

## 2021-04-04 DIAGNOSIS — I5032 Chronic diastolic (congestive) heart failure: Secondary | ICD-10-CM | POA: Diagnosis not present

## 2021-04-04 DIAGNOSIS — I272 Pulmonary hypertension, unspecified: Secondary | ICD-10-CM | POA: Diagnosis not present

## 2021-04-04 DIAGNOSIS — J9602 Acute respiratory failure with hypercapnia: Secondary | ICD-10-CM

## 2021-04-04 DIAGNOSIS — J9601 Acute respiratory failure with hypoxia: Secondary | ICD-10-CM | POA: Diagnosis not present

## 2021-04-04 DIAGNOSIS — J9621 Acute and chronic respiratory failure with hypoxia: Secondary | ICD-10-CM | POA: Diagnosis present

## 2021-04-04 DIAGNOSIS — J189 Pneumonia, unspecified organism: Secondary | ICD-10-CM | POA: Diagnosis not present

## 2021-04-04 LAB — CBC
HCT: 39.8 % (ref 39.0–52.0)
HCT: 40.5 % (ref 39.0–52.0)
Hemoglobin: 11.9 g/dL — ABNORMAL LOW (ref 13.0–17.0)
Hemoglobin: 12.1 g/dL — ABNORMAL LOW (ref 13.0–17.0)
MCH: 29.5 pg (ref 26.0–34.0)
MCH: 29.2 pg (ref 26.0–34.0)
MCHC: 29.9 g/dL — ABNORMAL LOW (ref 30.0–36.0)
MCHC: 29.9 g/dL — ABNORMAL LOW (ref 30.0–36.0)
MCV: 98.8 fL (ref 80.0–100.0)
MCV: 97.8 fL (ref 80.0–100.0)
Platelets: 138 10*3/uL — ABNORMAL LOW (ref 150–400)
Platelets: 143 10*3/uL — ABNORMAL LOW (ref 150–400)
RBC: 4.03 MIL/uL — ABNORMAL LOW (ref 4.22–5.81)
RBC: 4.14 MIL/uL — ABNORMAL LOW (ref 4.22–5.81)
RDW: 14.4 % (ref 11.5–15.5)
RDW: 14.3 % (ref 11.5–15.5)
WBC: 10.5 10*3/uL (ref 4.0–10.5)
WBC: 7.2 10*3/uL (ref 4.0–10.5)
nRBC: 0 % (ref 0.0–0.2)
nRBC: 0 % (ref 0.0–0.2)

## 2021-04-04 LAB — BASIC METABOLIC PANEL
Anion gap: 8 (ref 5–15)
BUN: 9 mg/dL (ref 6–20)
CO2: 43 mmol/L — ABNORMAL HIGH (ref 22–32)
Calcium: 8.4 mg/dL — ABNORMAL LOW (ref 8.9–10.3)
Chloride: 89 mmol/L — ABNORMAL LOW (ref 98–111)
Creatinine, Ser: 0.7 mg/dL (ref 0.61–1.24)
GFR, Estimated: >60 mL/min (ref >60)
Glucose, Bld: 126 mg/dL — ABNORMAL HIGH (ref 70–99)
Potassium: 3.9 mmol/L (ref 3.5–5.1)
Sodium: 140 mmol/L (ref 135–145)

## 2021-04-04 LAB — PROCALCITONIN: Procalcitonin: <0.1 ng/mL

## 2021-04-04 LAB — CREATININE, SERUM
Creatinine, Ser: 0.67 mg/dL (ref 0.61–1.24)
GFR, Estimated: >60 mL/min (ref >60)

## 2021-04-04 LAB — HIV ANTIBODY (ROUTINE TESTING W REFLEX): HIV Screen 4th Generation wRfx: NONREACTIVE

## 2021-04-04 NOTE — Plan of Care (Signed)
  Problem: Education: Goal: Knowledge of General Education information will improve Description: Including pain rating scale, medication(s)/side effects and non-pharmacologic comfort measures Outcome: Progressing   Problem: Clinical Measurements: Goal: Respiratory complications will improve Outcome: Progressing   Problem: Activity: Goal: Risk for activity intolerance will decrease Outcome: Progressing   

## 2021-04-04 NOTE — Consult Note (Addendum)
NAME:  [name], MRN:  [id number], DOB:  16-Jun-1981, LOS: 1 ADMISSION DATE:  04/03/2021, CONSULTATION DATE:  04/04/21 REFERRING MD:  TRH, CHIEF COMPLAINT:  SOB   History of Present Illness:  40 year old man with history of severe pectus excavatum s/p repair x 2 with resultant profound restrictive lung disease, secondary RV failure, cardiac cirrhosis, chronic hypercarbia presenting with worsening respiratory status.  Started insidiously, no sick contacts, cough, fevers, chills.    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  Objective   Blood pressure 138/78, pulse 82, temperature 98.1 F (36.7 C), temperature source Oral, resp. rate (!) 22, SpO2 100 %.    Vent Mode: BIPAP FiO2 (%):  [40 %] 40 % Set Rate:  [10 bmp-15 bmp] 15 bmp PEEP:  [6 cmH20] 6 cmH20   Intake/Output Summary (Last 24 hours) at 04/04/2021 0736 Last data filed at 04/03/2021 2231 Gross per 24 hour  Intake --  Output 600 ml  Net -600 ml   There were no vitals filed for this visit.  Examination: General: no distress on BIPAP HENT: BIPAP in place with good seal Lungs: Diminished, especially on L Cardiovascular: displaced heart sounds to left, regular, ext warm Abdomen: soft, +BS Extremities: not much edema Neuro: moves all 4 ext to command Skin: no rashes or concerning lesions  Resolved Hospital Problem list   N/a  Assessment & Plan:  Acute on chronic hypercarbic respiratory failure, question of pneumonia on CT but this could all be atelectasis Asthma which I suspect is just compression of bronchi from his  pectus Pectus>>restrictive lung disease>>RV failure>>cirrhosis, seems euvolemic  - Abx sounds reasonable, IS/flutter - Wean off BIPAP as able during day - Long term he is probably headed for trach and 24/7 ventilation unfortunately - Will let Dr. [name] know of his admission  Best Practice (right click and "Reselect all SmartList Selections" daily)  Per primary  Labs   CBC: Recent Labs  Lab 04/03/21 1400 04/03/21 2317 04/04/21 0336  WBC 10.5 10.5 7.2  HGB 13.3 11.9* 12.1*  HCT 44.7 39.8 40.5  MCV 98.5 98.8 97.8  PLT 174 138* 143*    Basic Metabolic Panel: Recent Labs  Lab 04/03/21 1220 04/03/21 2317 04/04/21 0336  NA 140  --  140  K 4.1  --  3.9  CL 89*  --  89*  CO2 42*  --  43*  GLUCOSE 128*  --  126*  BUN 8  --  9  CREATININE 0.70 0.67 0.70  CALCIUM 8.2*  --  8.4*   GFR: CrCl cannot be calculated (Unknown ideal weight.). Recent Labs  Lab 04/03/21 1400 04/03/21 2317 04/04/21 0336  WBC 10.5 10.5 7.2    Liver Function Tests: Recent Labs  Lab 04/03/21 1728  AST 24  ALT 33  ALKPHOS 85  BILITOT 0.6  PROT 8.3*  ALBUMIN 4.4   No results for input(s): LIPASE, AMYLASE in the last 168 hours. No results for input(s): AMMONIA in the last 168 hours.  ABG    Component Value Date/Time   PHART 7.328 (L) 10/25/2019 1435   PCO2ART 81.2 (HH) 10/25/2019 1435   PO2ART 76.3 (  L) 10/25/2019 1435   HCO3 41.4 (H) 10/25/2019 1435   TCO2 34 06/12/2010 1327   O2SAT 94.2 10/25/2019 1435     Coagulation Profile: No results for input(s): INR, PROTIME in the last 168 hours.  Cardiac Enzymes: No results for input(s): CKTOTAL, CKMB, CKMBINDEX, TROPONINI in the last 168 hours.  HbA1C: Hgb A1c MFr Bld  Date/Time Value Ref Range Status  04/25/2019 02:38 AM 5.4 4.8 - 5.6 % Final    Comment:    (NOTE) Pre diabetes:          5.7%-6.4% Diabetes:              >6.4% Glycemic control for   <7.0% adults with diabetes     CBG: No results for input(s): GLUCAP in  the last 168 hours.  Review of Systems:    Positive Symptoms in bold:  Constitutional fevers, chills, weight loss, fatigue, anorexia, malaise  Eyes decreased vision, double vision, eye irritation  Ears, Nose, Mouth, Throat sore throat, trouble swallowing, sinus congestion  Cardiovascular chest pain, paroxysmal nocturnal dyspnea, lower ext edema, palpitations   Respiratory SOB, cough, DOE, hemoptysis, wheezing  Gastrointestinal nausea, vomiting, diarrhea  Genitourinary burning with urination, trouble urinating  Musculoskeletal joint aches, joint swelling, back pain  Integumentary  rashes, skin lesions  Neurological focal weakness, focal numbness, trouble speaking, headaches  Psychiatric depression, anxiety, confusion  Endocrine polyuria, polydipsia, cold intolerance, heat intolerance  Hematologic abnormal bruising, abnormal bleeding, unexplained nose bleeds  Allergic/Immunologic recurrent infections, hives, swollen lymph nodes     Past Medical History:  He,  has a past medical history of Asthma, CHF (congestive heart failure) (HCC), Chronic hypercapnic respiratory failure ([facility]), Pectus excavatum, and Restrictive lung disease.   Surgical History:   Past Surgical History:  Procedure Laterality Date   KNEE SURGERY     right   PECTUS EXCAVATUM REPAIR     RIGHT HEART CATH N/A 04/21/2019   Procedure: RIGHT HEART CATH;  Surgeon: Larey Dresser, MD;  Location: [facility];  Service: Cardiovascular;  Laterality: N/A;     Social History:   reports that he quit smoking about 14 years ago. His smoking use included cigarettes. He has a 0.10 pack-year smoking history. He has never used smokeless tobacco. He reports that he does not drink alcohol and does not use drugs.   Family History:  His family history includes Hypertension in his mother. There is no history of Heart attack or Stroke.   Allergies Allergies  Allergen Reactions   Other Itching and Other (See Comments)     Pet Dander     Home Medications  Prior to Admission medications   Medication Sig Start Date End Date Taking? Authorizing Provider  acetaminophen (TYLENOL) 500 MG tablet Take 1,000 mg by mouth every 6 (six) hours as needed for mild pain.   Yes [provider]  albuterol (PROAIR HFA) 108 (90 BASE) MCG/ACT inhaler Inhale 2 puffs into the lungs every 6 (six) hours as needed for wheezing or shortness of breath. 04/29/14  Yes Clance, Armando Reichert, MD  bisoprolol (ZEBETA) 5 MG tablet TAKE 1 TABLET BY MOUTH EVERY DAY Patient taking differently: Take 5 mg by mouth daily. 12/07/20  Yes Larey Dresser, MD  busPIRone (BUSPAR) 15 MG tablet Take 15 mg by mouth 3 (three) times daily. 03/27/21  Yes [provider]  cetirizine (ZYRTEC) 10 MG tablet Take 1 tablet (10 mg total) by mouth daily. 08/18/12  Yes Clance, Armando Reichert, MD  dapagliflozin  propanediol (FARXIGA) 10 MG TABS tablet Take 1 tablet (10 mg total) by mouth daily before breakfast. 02/14/21  Yes Larey Dresser, MD  FLUoxetine (PROZAC) 20 MG capsule Take 20 mg by mouth daily. 07/19/20  Yes [provider]  furosemide (LASIX) 20 MG tablet TAKE 2 TABLETS (40 MG TOTAL) BY MOUTH EVERY MORNING AND 1 TABLET (20 MG TOTAL) EVERY EVENING. NEEDS DOCTOR APPOINTMENT FOR FURTHER REFILL.Marland Kitchen 11/14/20  Yes Larey Dresser, MD  isosorbide mononitrate (IMDUR) 30 MG 24 hr tablet TAKE 1 TABLET (30 MG TOTAL) BY MOUTH DAILY. NEED APPT FOR FUTURE REFILLS [phone] Patient taking differently: Take 30 mg by mouth daily. 04/05/20  Yes Bensimhon, Shaune Pascal, MD  Melatonin 10 MG TABS Take 10 mg by mouth daily as needed (sleep).   Yes [provider]  mirtazapine (REMERON) 15 MG tablet Take 15 mg by mouth at bedtime. 07/19/20  Yes [provider]  omeprazole (PRILOSEC OTC) 20 MG tablet Take 20 mg by mouth daily.   Yes [provider]  Potassium Chloride ER 20 MEQ TBCR TAKE 2 TABLETS BY MOUTH EVERY DAY Patient taking differently: Take 40 mEq  by mouth daily. 12/07/20  Yes Larey Dresser, MD  spironolactone (ALDACTONE) 25 MG tablet TAKE 1 TABLET BY MOUTH EVERY DAY Patient taking differently: Take 25 mg by mouth daily. 12/07/20  Yes Larey Dresser, MD

## 2021-04-04 NOTE — ED Notes (Signed)
RT and MD at the bedside. Attempting to place on [place] and come off Bipap at this time

## 2021-04-04 NOTE — Progress Notes (Signed)
[facility] PROGRESS NOTE    Progress Note  [name]  [id number] DOB: 01/09/1981 DOA: 04/03/2021 PCP: [name], PA-C     Brief Narrative:   [name] is an 40 y.o. male past medical history hyperbaric respiratory failure requiring BiPAP at bedtime, chronic diastolic heart failure pulmonary hypertension cirrhosis of the liver comes to the ED complaining of shortness of breath was found to be in acute respiratory failure with hypoxia and hypercarbia likely due to pneumonia in the setting of restrictive lung disease.  Was started on BiPAP in the ED saturations improved.   Assessment/Plan:   Acute respiratory failure with hypoxia and hypercapnia due to probably pneumonia in the setting of restrictive lung disease and chronic diastolic heart failure: Was started on BiPAP in the ED saturations and mentation improved. BNP is 40 cardiac biomarkers are unremarkable. Will start empirically on antibiotics, Ceftin and azithromycin. Blood cultures have been ordered influenza and SARS-CoV-2 PCR negative. Check a procalcitonin. We will try him on 8 L high flow nasal cannula and try to titrate him off the BiPAP.  Chronic diastolic heart failure and pulmonary hypertension: Last 2D echo showed an EF of 50% in June 2022. Was given IV Lasix and continued on Aldactone and Jardiance. Restrict fluids.  History of asthma: Stable not wheezing.  History of cirrhosis: Cont aldactone.   DVT prophylaxis: lovenox Family Communication:none Status is: Inpatient  Remains inpatient appropriate because:Hemodynamically unstable  Dispo: The patient is from: Home              Anticipated d/c is to: Home              Patient currently is not medically stable to d/c.   Difficult to place patient No        Code Status:     Code Status Orders  (From admission, onward)           Start     Ordered   04/03/21 2206  Full code  Continuous        04/03/21 2206            Code Status History     Date Active Date Inactive Code Status Order ID Comments User Context   04/20/2019 2003 04/27/2019 1919 Full Code [id number]  Vianne Bulls, MD Inpatient         IV Access:   Peripheral IV   Procedures and diagnostic studies:   DG Chest 2 View  Result Date: 04/03/2021 CLINICAL DATA:  Shortness of breath.  History of asthma. EXAM: CHEST - 2 VIEW COMPARISON:  09/29/2019. FINDINGS: New/increased consolidation in the left greater than right perihilar regions and left lung base, which is superimposed on chronic bilateral perihilar opacities. Enlarged cardiac silhouette that is partly obscured. Central pulmonary vascular dilation. No visible pleural effusions or pneumothorax. Chronic pectus deformity. Markedly dilated stomach, partially imaged. IMPRESSION: 1. New/increased left greater than right perihilar and left basilar consolidation, which is superimposed on chronic perihilar opacities. 2. Chronic cardiomegaly and dilated central pulmonary vasculature. 3. Markedly dilated stomach, partially imaged. Considered decompression. Electronically Signed   By: Margaretha Sheffield MD   On: 04/03/2021 13:43   CT Head Wo Contrast  Result Date: 04/03/2021 CLINICAL DATA:  40 year old male with headache. EXAM: CT HEAD WITHOUT CONTRAST TECHNIQUE: Contiguous axial images were obtained from the base of the skull through the vertex without intravenous contrast. COMPARISON:  CT of the paranasal sinuses dated 08/07/2011. FINDINGS: Brain: The ventricles and  sulci are appropriate size for patient's age. The gray-white matter discrimination is preserved. There is no acute intracranial hemorrhage. No mass effect or midline shift no extra-axial fluid collection. Vascular: No hyperdense vessel or unexpected calcification. Skull: Normal. Negative for fracture or focal lesion. Sinuses/Orbits: No acute finding. Other: None IMPRESSION: Unremarkable noncontrast CT of the brain.  Electronically Signed   By: Anner Crete M.D.   On: 04/03/2021 21:20   CT Chest W Contrast  Result Date: 04/03/2021 CLINICAL DATA:  Headache and abnormal chest x-ray. EXAM: CT CHEST WITH CONTRAST TECHNIQUE: Multidetector CT imaging of the chest was performed during intravenous contrast administration. CONTRAST:  31m OMNIPAQUE IOHEXOL 300 MG/ML  SOLN COMPARISON:  April 21, 2019 FINDINGS: Cardiovascular: There is stable dilatation of the main pulmonary artery (5.1 cm). The pulmonary arteries are limited in evaluation secondary to suboptimal opacification with intravenous contrast. No intraluminal filling defects are identified. There is marked severity cardiomegaly. Mass effect from an existing pectus deformity is again noted. No pericardial effusion. Mediastinum/Nodes: No enlarged mediastinal, hilar, or axillary lymph nodes. Thyroid gland, trachea, and esophagus demonstrate no significant findings. Lungs/Pleura: Stable moderate to marked severity areas of consolidation and/or atelectasis are seen within the right middle lobe, right lower lobe and throughout the left lung. Associated bronchiectasis is again seen with stable left-sided volume loss. A stable 7 mm noncalcified lung nodule is seen within the lateral aspect of the right lower lobe. There is no evidence of a pleural effusion or pneumothorax. Upper Abdomen: No acute abnormality. Musculoskeletal: A pectus deformity of the anterior chest wall is seen. IMPRESSION: 1. Stable moderate to marked severity areas of bilateral consolidation and atelectasis, which may represent sequelae associated with a chronic infectious or chronic inflammatory process. 2. Stable 7 mm noncalcified right lower lobe lung nodule. Non-contrast chest CT at 6-12 months is recommended. If the nodule is stable at time of repeat CT, then future CT at 18-24 months (from today's scan) is considered optional for low-risk patients, but is recommended for high-risk patients. This  recommendation follows the consensus statement: Guidelines for Management of Incidental Pulmonary Nodules Detected on CT Images: From the Fleischner Society 2017; Radiology 2017; 284:228-243. 3. Pectus excavatum deformity with subsequent mass effect on the heart. 4. Stable central pulmonary arterial enlargement consistent with pulmonary arterial hypertension. Electronically Signed   By: TVirgina NorfolkM.D.   On: 04/03/2021 21:31     Medical Consultants:   None.   Subjective:    CThad RangerII he relates his breathing is significantly better.  Objective:    Vitals:   04/04/21 0500 04/04/21 0530 04/04/21 0600 04/04/21 0630  BP: (!) 143/72 134/76 139/86 (!) 148/88  Pulse: 91 91 85 93  Resp: (!) 22 (!) 25 (!) 26 (!) 22  Temp:      TempSrc:      SpO2: 90% 94% 95% 92%   SpO2: 92 % O2 Flow Rate (L/min): 5 L/min FiO2 (%): 40 %   Intake/Output Summary (Last 24 hours) at 04/04/2021 0701 Last data filed at 04/03/2021 2231 Gross per 24 hour  Intake --  Output 600 ml  Net -600 ml   There were no vitals filed for this visit.  Exam: General exam: In no acute distress. Respiratory system: Good air movement and clear to auscultation. Cardiovascular system: S1 & S2 heard, RRR. No JVD, murmurs, rubs, gallops or clicks.  Gastrointestinal system: Abdomen is nondistended, soft and nontender.  Central nervous system: Alert and oriented. No focal neurological deficits. Extremities:  No pedal edema. Skin: No rashes, lesions or ulcers Psychiatry: Judgement and insight appear normal. Mood & affect appropriate.    Data Reviewed:    Labs: Basic Metabolic Panel: Recent Labs  Lab 04/03/21 1220 04/03/21 2317 04/04/21 0336  NA 140  --  140  K 4.1  --  3.9  CL 89*  --  89*  CO2 42*  --  43*  GLUCOSE 128*  --  126*  BUN 8  --  9  CREATININE 0.70 0.67 0.70  CALCIUM 8.2*  --  8.4*   GFR CrCl cannot be calculated (Unknown ideal weight.). Liver Function Tests: Recent Labs  Lab  04/03/21 1728  AST 24  ALT 33  ALKPHOS 85  BILITOT 0.6  PROT 8.3*  ALBUMIN 4.4   No results for input(s): LIPASE, AMYLASE in the last 168 hours. No results for input(s): AMMONIA in the last 168 hours. Coagulation profile No results for input(s): INR, PROTIME in the last 168 hours. COVID-19 Labs  No results for input(s): DDIMER, FERRITIN, LDH, CRP in the last 72 hours.  Lab Results  Component Value Date   SARSCOV2NAA NEGATIVE 04/03/2021   [place] NEGATIVE 04/03/2021   [place] Not Detected 03/05/2019    CBC: Recent Labs  Lab 04/03/21 1400 04/03/21 2317 04/04/21 0336  WBC 10.5 10.5 7.2  HGB 13.3 11.9* 12.1*  HCT 44.7 39.8 40.5  MCV 98.5 98.8 97.8  PLT 174 138* 143*   Cardiac Enzymes: No results for input(s): CKTOTAL, CKMB, CKMBINDEX, TROPONINI in the last 168 hours. BNP (last 3 results) No results for input(s): PROBNP in the last 8760 hours. CBG: No results for input(s): GLUCAP in the last 168 hours. D-Dimer: No results for input(s): DDIMER in the last 72 hours. Hgb A1c: No results for input(s): HGBA1C in the last 72 hours. Lipid Profile: No results for input(s): CHOL, HDL, LDLCALC, TRIG, CHOLHDL, LDLDIRECT in the last 72 hours. Thyroid function studies: No results for input(s): TSH, T4TOTAL, T3FREE, THYROIDAB in the last 72 hours.  Invalid input(s): FREET3 Anemia work up: No results for input(s): VITAMINB12, FOLATE, FERRITIN, TIBC, IRON, RETICCTPCT in the last 72 hours. Sepsis Labs: Recent Labs  Lab 04/03/21 1400 04/03/21 2317 04/04/21 0336  WBC 10.5 10.5 7.2   Microbiology Recent Results (from the past 240 hour(s))  SARS CORONAVIRUS 2 (TAT 6-24 HRS) Nasopharyngeal Nasopharyngeal Swab     Status: None   Collection Time: 04/03/21  5:25 PM   Specimen: Nasopharyngeal Swab  Result Value Ref Range Status   SARS Coronavirus 2 NEGATIVE NEGATIVE Final    Comment: (NOTE) SARS-CoV-2 target nucleic acids are NOT DETECTED.  The SARS-CoV-2 RNA is  generally detectable in upper and lower respiratory specimens during the acute phase of infection. Negative results do not preclude SARS-CoV-2 infection, do not rule out co-infections with other pathogens, and should not be used as the sole basis for treatment or other patient management decisions. Negative results must be combined with clinical observations, patient history, and epidemiological information. The expected result is Negative.  Fact Sheet for Patients: SugarRoll.be  Fact Sheet for Healthcare Providers: https://www.woods-mathews.com/  This test is not yet approved or cleared by the [place] FDA and  has been authorized for detection and/or diagnosis of SARS-CoV-2 by FDA under an Emergency Use Authorization (EUA). This EUA will remain  in effect (meaning this test can be used) for the duration of the COVID-19 declaration under Se ction 564(b)(1) of the Act, 21 U.S.C. section 360bbb-3(b)(1), unless the authorization is terminated  or revoked sooner.  Performed at [facility], [place] [address] [place], [place] [address]   Resp Panel by RT-PCR (Flu A&B, Covid) Nasopharyngeal Swab     Status: None   Collection Time: 04/03/21  9:45 PM   Specimen: Nasopharyngeal Swab; Nasopharyngeal(NP) swabs in vial transport medium  Result Value Ref Range Status   SARS Coronavirus 2 by RT PCR NEGATIVE NEGATIVE Final    Comment: (NOTE) SARS-CoV-2 target nucleic acids are NOT DETECTED.  The SARS-CoV-2 RNA is generally detectable in upper respiratory specimens during the acute phase of infection. The lowest concentration of SARS-CoV-2 viral copies this assay can detect is 138 copies/mL. A negative result does not preclude SARS-Cov-2 infection and should not be used as the sole basis for treatment or other patient management decisions. A negative result may occur with  improper specimen collection/handling, submission of specimen  other than nasopharyngeal swab, presence of viral mutation(s) within the areas targeted by this assay, and inadequate number of viral copies(<138 copies/mL). A negative result must be combined with clinical observations, patient history, and epidemiological information. The expected result is Negative.  Fact Sheet for Patients:  EntrepreneurPulse.com.au  Fact Sheet for Healthcare Providers:  IncredibleEmployment.be  This test is no t yet approved or cleared by the [place] FDA and  has been authorized for detection and/or diagnosis of SARS-CoV-2 by FDA under an Emergency Use Authorization (EUA). This EUA will remain  in effect (meaning this test can be used) for the duration of the COVID-19 declaration under Section 564(b)(1) of the Act, 21 U.S.C.section 360bbb-3(b)(1), unless the authorization is terminated  or revoked sooner.       Influenza A by PCR NEGATIVE NEGATIVE Final   Influenza B by PCR NEGATIVE NEGATIVE Final    Comment: (NOTE) The Xpert Xpress SARS-CoV-2/FLU/RSV plus assay is intended as an aid in the diagnosis of influenza from Nasopharyngeal swab specimens and should not be used as a sole basis for treatment. Nasal washings and aspirates are unacceptable for Xpert Xpress SARS-CoV-2/FLU/RSV testing.  Fact Sheet for Patients: EntrepreneurPulse.com.au  Fact Sheet for Healthcare Providers: IncredibleEmployment.be  This test is not yet approved or cleared by the [place] FDA and has been authorized for detection and/or diagnosis of SARS-CoV-2 by FDA under an Emergency Use Authorization (EUA). This EUA will remain in effect (meaning this test can be used) for the duration of the COVID-19 declaration under Section 564(b)(1) of the Act, 21 U.S.C. section 360bbb-3(b)(1), unless the authorization is terminated or revoked.  Performed at [facility], [place] [address] [place],  [place] [address]      Medications:    albuterol  2.5 mg Nebulization Q6H   bisoprolol  5 mg Oral Daily   busPIRone  15 mg Oral TID   enoxaparin (LOVENOX) injection  40 mg Subcutaneous Q24H   FLUoxetine  20 mg Oral Daily   furosemide  20 mg Oral QPM   furosemide  40 mg Oral Daily   isosorbide mononitrate  30 mg Oral Daily   mirtazapine  15 mg Oral QHS   pantoprazole  40 mg Oral Daily   potassium chloride  40 mEq Oral BID   spironolactone  25 mg Oral Daily   Continuous Infusions:  azithromycin     cefTRIAXone (ROCEPHIN)  IV        LOS: 1 day   Charlynne Cousins  [facility]  04/04/2021, 7:01 AM

## 2021-04-05 ENCOUNTER — Telehealth: Payer: Self-pay | Admitting: Internal Medicine

## 2021-04-05 ENCOUNTER — Encounter (HOSPITAL_COMMUNITY): Payer: Self-pay

## 2021-04-05 DIAGNOSIS — I272 Pulmonary hypertension, unspecified: Secondary | ICD-10-CM

## 2021-04-05 DIAGNOSIS — R0600 Dyspnea, unspecified: Secondary | ICD-10-CM

## 2021-04-05 DIAGNOSIS — J9601 Acute respiratory failure with hypoxia: Secondary | ICD-10-CM | POA: Diagnosis not present

## 2021-04-05 DIAGNOSIS — I5032 Chronic diastolic (congestive) heart failure: Secondary | ICD-10-CM | POA: Diagnosis not present

## 2021-04-05 DIAGNOSIS — J189 Pneumonia, unspecified organism: Secondary | ICD-10-CM | POA: Diagnosis not present

## 2021-04-05 DIAGNOSIS — R0609 Other forms of dyspnea: Secondary | ICD-10-CM

## 2021-04-05 LAB — BASIC METABOLIC PANEL
Anion gap: 7 (ref 5–15)
BUN: 13 mg/dL (ref 6–20)
CO2: 45 mmol/L — ABNORMAL HIGH (ref 22–32)
Calcium: 8.6 mg/dL — ABNORMAL LOW (ref 8.9–10.3)
Chloride: 89 mmol/L — ABNORMAL LOW (ref 98–111)
Creatinine, Ser: 0.67 mg/dL (ref 0.61–1.24)
GFR, Estimated: >60 mL/min (ref >60)
Glucose, Bld: 130 mg/dL — ABNORMAL HIGH (ref 70–99)
Potassium: 4.1 mmol/L (ref 3.5–5.1)
Sodium: 141 mmol/L (ref 135–145)

## 2021-04-05 LAB — PROCALCITONIN: Procalcitonin: <0.1 ng/mL

## 2021-04-05 MED ORDER — DAPAGLIFLOZIN PROPANEDIOL 10 MG PO TABS: 10.0000 mg | ORAL_TABLET | Freq: Every day | ORAL | Status: DC

## 2021-04-05 MED ORDER — DAPAGLIFLOZIN PROPANEDIOL 10 MG PO TABS
10.0000 mg | ORAL_TABLET | Freq: Every day | ORAL | Status: DC
  Administered 2021-04-06 – 2021-04-07 (×2): 10 mg via ORAL
  Filled 2021-04-05 (×4): qty 1

## 2021-04-05 MED ORDER — FUROSEMIDE 40 MG PO TABS
40.0000 mg | ORAL_TABLET | Freq: Every day | ORAL | Status: DC
  Administered 2021-04-07: 40 mg via ORAL
  Filled 2021-04-05: qty 1

## 2021-04-05 MED ORDER — AZITHROMYCIN 250 MG PO TABS
500.0000 mg | ORAL_TABLET | Freq: Every evening | ORAL | Status: DC
  Administered 2021-04-05 – 2021-04-06 (×2): 500 mg via ORAL
  Filled 2021-04-05 (×2): qty 2

## 2021-04-05 MED ORDER — FUROSEMIDE 20 MG PO TABS: 20.0000 mg | ORAL_TABLET | Freq: Every evening | ORAL | Status: DC

## 2021-04-05 NOTE — Telephone Encounter (Signed)
Dyspnea, pulm hypertension, should all work

## 2021-04-05 NOTE — Progress Notes (Signed)
[facility] PROGRESS NOTE    Progress Note  [name]  [id number] DOB: Jan 13, 1981 DOA: 04/03/2021 PCP: [name], PA-C     Brief Narrative:   [name] is an 40 y.o. male past medical history hyperbaric respiratory failure requiring BiPAP at bedtime, chronic diastolic heart failure pulmonary hypertension cirrhosis of the liver comes to the ED complaining of shortness of breath was found to be in acute respiratory failure with hypoxia and hypercarbia likely due to pneumonia in the setting of restrictive lung disease.  Was started on BiPAP in the ED saturations improved.   Assessment/Plan:   Acute respiratory failure with hypoxia and hypercapnia due to probably pneumonia in the setting of restrictive lung disease and chronic diastolic heart failure: Was started on BiPAP in the ED saturations and mentation improved. BNP is 39 cardiac biomarkers are unremarkable. Will start empirically on antibiotics, Ceftin and azithromycin. Procalcitonin negative now on 6 L of oxygen. Appreciate pulmonary's assistance.  Chronic diastolic heart failure and pulmonary hypertension: Last 2D echo showed an EF of 50% in June 2022. Negative about 2 L continue IV Lasix and Aldactone. Basic metabolic panels pending continue fluid restriction.  History of asthma: Stable not wheezing.  History of cirrhosis: Cont aldactone.   DVT prophylaxis: lovenox Family Communication:none Status is: Inpatient  Remains inpatient appropriate because:Hemodynamically unstable  Dispo: The patient is from: Home              Anticipated d/c is to: Home              Patient currently is not medically stable to d/c.   Difficult to place patient No        Code Status:     Code Status Orders  (From admission, onward)           Start     Ordered   04/03/21 2206  Full code  Continuous        04/03/21 2206           Code Status History     Date Active Date  Inactive Code Status Order ID Comments User Context   04/20/2019 2003 04/27/2019 1919 Full Code [id number]  Vianne Bulls, MD Inpatient         IV Access:   Peripheral IV   Procedures and diagnostic studies:   DG Chest 2 View  Result Date: 04/03/2021 CLINICAL DATA:  Shortness of breath.  History of asthma. EXAM: CHEST - 2 VIEW COMPARISON:  09/29/2019. FINDINGS: New/increased consolidation in the left greater than right perihilar regions and left lung base, which is superimposed on chronic bilateral perihilar opacities. Enlarged cardiac silhouette that is partly obscured. Central pulmonary vascular dilation. No visible pleural effusions or pneumothorax. Chronic pectus deformity. Markedly dilated stomach, partially imaged. IMPRESSION: 1. New/increased left greater than right perihilar and left basilar consolidation, which is superimposed on chronic perihilar opacities. 2. Chronic cardiomegaly and dilated central pulmonary vasculature. 3. Markedly dilated stomach, partially imaged. Considered decompression. Electronically Signed   By: Margaretha Sheffield MD   On: 04/03/2021 13:43   CT Head Wo Contrast  Result Date: 04/03/2021 CLINICAL DATA:  40 year old male with headache. EXAM: CT HEAD WITHOUT CONTRAST TECHNIQUE: Contiguous axial images were obtained from the base of the skull through the vertex without intravenous contrast. COMPARISON:  CT of the paranasal sinuses dated 08/07/2011. FINDINGS: Brain: The ventricles and sulci are appropriate size for patient's age. The gray-white matter discrimination is preserved. There is no acute  intracranial hemorrhage. No mass effect or midline shift no extra-axial fluid collection. Vascular: No hyperdense vessel or unexpected calcification. Skull: Normal. Negative for fracture or focal lesion. Sinuses/Orbits: No acute finding. Other: None IMPRESSION: Unremarkable noncontrast CT of the brain. Electronically Signed   By: Anner Crete M.D.   On: 04/03/2021 21:20    CT Chest W Contrast  Result Date: 04/03/2021 CLINICAL DATA:  Headache and abnormal chest x-ray. EXAM: CT CHEST WITH CONTRAST TECHNIQUE: Multidetector CT imaging of the chest was performed during intravenous contrast administration. CONTRAST:  27m OMNIPAQUE IOHEXOL 300 MG/ML  SOLN COMPARISON:  April 21, 2019 FINDINGS: Cardiovascular: There is stable dilatation of the main pulmonary artery (5.1 cm). The pulmonary arteries are limited in evaluation secondary to suboptimal opacification with intravenous contrast. No intraluminal filling defects are identified. There is marked severity cardiomegaly. Mass effect from an existing pectus deformity is again noted. No pericardial effusion. Mediastinum/Nodes: No enlarged mediastinal, hilar, or axillary lymph nodes. Thyroid gland, trachea, and esophagus demonstrate no significant findings. Lungs/Pleura: Stable moderate to marked severity areas of consolidation and/or atelectasis are seen within the right middle lobe, right lower lobe and throughout the left lung. Associated bronchiectasis is again seen with stable left-sided volume loss. A stable 7 mm noncalcified lung nodule is seen within the lateral aspect of the right lower lobe. There is no evidence of a pleural effusion or pneumothorax. Upper Abdomen: No acute abnormality. Musculoskeletal: A pectus deformity of the anterior chest wall is seen. IMPRESSION: 1. Stable moderate to marked severity areas of bilateral consolidation and atelectasis, which may represent sequelae associated with a chronic infectious or chronic inflammatory process. 2. Stable 7 mm noncalcified right lower lobe lung nodule. Non-contrast chest CT at 6-12 months is recommended. If the nodule is stable at time of repeat CT, then future CT at 18-24 months (from today's scan) is considered optional for low-risk patients, but is recommended for high-risk patients. This recommendation follows the consensus statement: Guidelines for Management of  Incidental Pulmonary Nodules Detected on CT Images: From the Fleischner Society 2017; Radiology 2017; 284:228-243. 3. Pectus excavatum deformity with subsequent mass effect on the heart. 4. Stable central pulmonary arterial enlargement consistent with pulmonary arterial hypertension. Electronically Signed   By: TVirgina NorfolkM.D.   On: 04/03/2021 21:31     Medical Consultants:   None.   Subjective:    [name] relates his breathing is better.  Objective:    Vitals:   04/05/21 0507 04/05/21 0625 04/05/21 0805 04/05/21 0937  BP: 121/81   123/75  Pulse: 89   89  Resp: (!) 22     Temp: 97.8 F (36.6 C)     TempSrc: Axillary     SpO2: 97%  99% 97%  Weight:  111.2 kg    Height:       SpO2: 97 % O2 Flow Rate (L/min): 6 L/min FiO2 (%): 40 %   Intake/Output Summary (Last 24 hours) at 04/05/2021 1114 Last data filed at 04/05/2021 0500 Gross per 24 hour  Intake 586.67 ml  Output 900 ml  Net -313.33 ml    Filed Weights   04/04/21 2100 04/05/21 0625  Weight: 111.2 kg 111.2 kg    Exam: General exam: In no acute distress. Respiratory system: Good air movement and clear to auscultation. Cardiovascular system: S1 & S2 heard, RRR. No JVD. Gastrointestinal system: Abdomen is nondistended, soft and nontender.  Extremities: No pedal edema. Skin: No rashes, lesions or ulcers Psychiatry: Judgement and insight appear  normal. Mood & affect appropriate.  Data Reviewed:    Labs: Basic Metabolic Panel: Recent Labs  Lab 04/03/21 1220 04/03/21 2317 04/04/21 0336  NA 140  --  140  K 4.1  --  3.9  CL 89*  --  89*  CO2 42*  --  43*  GLUCOSE 128*  --  126*  BUN 8  --  9  CREATININE 0.70 0.67 0.70  CALCIUM 8.2*  --  8.4*    GFR Estimated Creatinine Clearance: 166 mL/min (by C-G formula based on SCr of 0.7 mg/dL). Liver Function Tests: Recent Labs  Lab 04/03/21 1728  AST 24  ALT 33  ALKPHOS 85  BILITOT 0.6  PROT 8.3*  ALBUMIN 4.4    No results for  input(s): LIPASE, AMYLASE in the last 168 hours. No results for input(s): AMMONIA in the last 168 hours. Coagulation profile No results for input(s): INR, PROTIME in the last 168 hours. COVID-19 Labs  No results for input(s): DDIMER, FERRITIN, LDH, CRP in the last 72 hours.  Lab Results  Component Value Date   SARSCOV2NAA NEGATIVE 04/03/2021   [place] NEGATIVE 04/03/2021   [place] Not Detected 03/05/2019    CBC: Recent Labs  Lab 04/03/21 1400 04/03/21 2317 04/04/21 0336  WBC 10.5 10.5 7.2  HGB 13.3 11.9* 12.1*  HCT 44.7 39.8 40.5  MCV 98.5 98.8 97.8  PLT 174 138* 143*    Cardiac Enzymes: No results for input(s): CKTOTAL, CKMB, CKMBINDEX, TROPONINI in the last 168 hours. BNP (last 3 results) No results for input(s): PROBNP in the last 8760 hours. CBG: No results for input(s): GLUCAP in the last 168 hours. D-Dimer: No results for input(s): DDIMER in the last 72 hours. Hgb A1c: No results for input(s): HGBA1C in the last 72 hours. Lipid Profile: No results for input(s): CHOL, HDL, LDLCALC, TRIG, CHOLHDL, LDLDIRECT in the last 72 hours. Thyroid function studies: No results for input(s): TSH, T4TOTAL, T3FREE, THYROIDAB in the last 72 hours.  Invalid input(s): FREET3 Anemia work up: No results for input(s): VITAMINB12, FOLATE, FERRITIN, TIBC, IRON, RETICCTPCT in the last 72 hours. Sepsis Labs: Recent Labs  Lab 04/03/21 1400 04/03/21 2317 04/04/21 0336 04/04/21 0705 04/05/21 0336  PROCALCITON  --   --   --  <0.10 <0.10  WBC 10.5 10.5 7.2  --   --     Microbiology Recent Results (from the past 240 hour(s))  SARS CORONAVIRUS 2 (TAT 6-24 HRS) Nasopharyngeal Nasopharyngeal Swab     Status: None   Collection Time: 04/03/21  5:25 PM   Specimen: Nasopharyngeal Swab  Result Value Ref Range Status   SARS Coronavirus 2 NEGATIVE NEGATIVE Final    Comment: (NOTE) SARS-CoV-2 target nucleic acids are NOT DETECTED.  The SARS-CoV-2 RNA is generally detectable in  upper and lower respiratory specimens during the acute phase of infection. Negative results do not preclude SARS-CoV-2 infection, do not rule out co-infections with other pathogens, and should not be used as the sole basis for treatment or other patient management decisions. Negative results must be combined with clinical observations, patient history, and epidemiological information. The expected result is Negative.  Fact Sheet for Patients: SugarRoll.be  Fact Sheet for Healthcare Providers: https://www.woods-mathews.com/  This test is not yet approved or cleared by the [place] FDA and  has been authorized for detection and/or diagnosis of SARS-CoV-2 by FDA under an Emergency Use Authorization (EUA). This EUA will remain  in effect (meaning this test can be used) for the duration of the  COVID-19 declaration under Se ction 564(b)(1) of the Act, 21 U.S.C. section 360bbb-3(b)(1), unless the authorization is terminated or revoked sooner.  Performed at [facility], [place] [address], [place], [place] [address]   Culture, blood (routine x 2)     Status: None (Preliminary result)   Collection Time: 04/03/21  5:27 PM   Specimen: BLOOD  Result Value Ref Range Status   Specimen Description BLOOD BLOOD RIGHT FOREARM  Final   Special Requests   Final    BOTTLES DRAWN AEROBIC AND ANAEROBIC Blood Culture results may not be optimal due to an inadequate volume of blood received in culture bottles   Culture   Final    NO GROWTH < 24 HOURS Performed at [facility], [place] [address], [place], [place] [address]    Report Status PENDING  Incomplete  Culture, blood (routine x 2)     Status: None (Preliminary result)   Collection Time: 04/03/21  6:52 PM   Specimen: BLOOD  Result Value Ref Range Status   Specimen Description BLOOD SITE NOT SPECIFIED  Final   Special Requests   Final    BOTTLES DRAWN AEROBIC AND ANAEROBIC Blood Culture  results may not be optimal due to an inadequate volume of blood received in culture bottles   Culture   Final    NO GROWTH < 24 HOURS Performed at [facility], [place] [address], [place], [place] [address]    Report Status PENDING  Incomplete  Resp Panel by RT-PCR (Flu A&B, Covid) Nasopharyngeal Swab     Status: None   Collection Time: 04/03/21  9:45 PM   Specimen: Nasopharyngeal Swab; Nasopharyngeal(NP) swabs in vial transport medium  Result Value Ref Range Status   SARS Coronavirus 2 by RT PCR NEGATIVE NEGATIVE Final    Comment: (NOTE) SARS-CoV-2 target nucleic acids are NOT DETECTED.  The SARS-CoV-2 RNA is generally detectable in upper respiratory specimens during the acute phase of infection. The lowest concentration of SARS-CoV-2 viral copies this assay can detect is 138 copies/mL. A negative result does not preclude SARS-Cov-2 infection and should not be used as the sole basis for treatment or other patient management decisions. A negative result may occur with  improper specimen collection/handling, submission of specimen other than nasopharyngeal swab, presence of viral mutation(s) within the areas targeted by this assay, and inadequate number of viral copies(<138 copies/mL). A negative result must be combined with clinical observations, patient history, and epidemiological information. The expected result is Negative.  Fact Sheet for Patients:  EntrepreneurPulse.com.au  Fact Sheet for Healthcare Providers:  IncredibleEmployment.be  This test is no t yet approved or cleared by the [place] FDA and  has been authorized for detection and/or diagnosis of SARS-CoV-2 by FDA under an Emergency Use Authorization (EUA). This EUA will remain  in effect (meaning this test can be used) for the duration of the COVID-19 declaration under Section 564(b)(1) of the Act, 21 U.S.C.section 360bbb-3(b)(1), unless the authorization is terminated   or revoked sooner.       Influenza A by PCR NEGATIVE NEGATIVE Final   Influenza B by PCR NEGATIVE NEGATIVE Final    Comment: (NOTE) The Xpert Xpress SARS-CoV-2/FLU/RSV plus assay is intended as an aid in the diagnosis of influenza from Nasopharyngeal swab specimens and should not be used as a sole basis for treatment. Nasal washings and aspirates are unacceptable for Xpert Xpress SARS-CoV-2/FLU/RSV testing.  Fact Sheet for Patients: EntrepreneurPulse.com.au  Fact Sheet for Healthcare Providers: IncredibleEmployment.be  This test is not yet approved or cleared by the [place] and has been authorized for detection and/or diagnosis of SARS-CoV-2 by FDA under an Emergency Use Authorization (EUA). This EUA will remain in effect (meaning this test can be used) for the duration of the COVID-19 declaration under Section 564(b)(1) of the Act, 21 U.S.C. section 360bbb-3(b)(1), unless the authorization is terminated or revoked.  Performed at [facility], [place] [address], [place], [place] [address]      Medications:    albuterol  2.5 mg Nebulization Q6H   bisoprolol  5 mg Oral Daily   busPIRone  15 mg Oral TID   enoxaparin (LOVENOX) injection  40 mg Subcutaneous Q24H   FLUoxetine  20 mg Oral Daily   furosemide  20 mg Oral QPM   furosemide  40 mg Oral Daily   isosorbide mononitrate  30 mg Oral Daily   mirtazapine  15 mg Oral QHS   pantoprazole  40 mg Oral Daily   potassium chloride  40 mEq Oral BID   spironolactone  25 mg Oral Daily   Continuous Infusions:  azithromycin 500 mg (04/04/21 2054)   cefTRIAXone (ROCEPHIN)  IV 2 g (04/05/21 0947)      LOS: 2 days   Charlynne Cousins  [facility]  04/05/2021, 11:14 AM

## 2021-04-05 NOTE — Telephone Encounter (Signed)
DOE, pulm HTN Referral to cardiopulmonary rehab

## 2021-04-05 NOTE — Evaluation (Signed)
Physical Therapy Evaluation Patient Details Name: [name] MRN: [id number] DOB: April 11, 1981 Today's Date: 04/05/2021   History of Present Illness  [name] is a 40 y.o. male with history of chronic hypercarbic respiratory failure requiring BiPAP at bedtime with history of chronic diastolic CHF and pulmonary hypertension and cirrhosis of the liver with history of pectus excavatum requiring surgery presents to the ER with complaints of shortness of breath.   Clinical Impression  Patient received in bed, he reports he is not quite back to baseline, but breathing better. Patient is currently on 6 lpm, which is his baseline. Patient is independent with bed mobility and transfers. He ambulated 60 feet in room with supervision. No lob. Patient is safe to ambulate in room or hallway independently/with family with O2. O2 tank left in patient's room. RN notified. Patient does not require PT follow up acutely. He may benefit from pulmonary rehab on outpatient basis. PT to sign off.     Follow Up Recommendations No PT follow up;Other (comment) (pulmonary rehab?)    Equipment Recommendations  None recommended by PT    Recommendations for Other Services       Precautions / Restrictions Precautions Precautions: None Restrictions Weight Bearing Restrictions: No      Mobility  Bed Mobility Overal bed mobility: Independent                  Transfers Overall transfer level: Independent                  Ambulation/Gait Ambulation/Gait assistance: Supervision Gait Distance (Feet): 60 Feet Assistive device: None Gait Pattern/deviations: WFL(Within Functional Limits);Step-through pattern Gait velocity: decreased, functional   General Gait Details: patient ambulating without ad and no lob, O2 saturations remained in 90%s with mobility on 6 lpm O2  Stairs            Wheelchair Mobility    Modified Rankin (Stroke Patients Only)       Balance Overall  balance assessment: Independent                                           Pertinent Vitals/Pain Pain Assessment: No/denies pain    Home Living Family/patient expects to be discharged to:: Private residence Living Arrangements: Spouse/significant other;Children Available Help at Discharge: Family;Available PRN/intermittently Type of Home: House Home Access: Stairs to enter   [employer] of Steps: 1 Home Layout: Two level Home Equipment: Shower seat - built in      Prior Function Level of Independence: Independent         Comments: on disability now     Hand Dominance        Extremity/Trunk Assessment   Upper Extremity Assessment Upper Extremity Assessment: Overall WFL for tasks assessed    Lower Extremity Assessment Lower Extremity Assessment: Overall WFL for tasks assessed    Cervical / Trunk Assessment Cervical / Trunk Assessment: Normal  Communication   Communication: No difficulties  Cognition Arousal/Alertness: Awake/alert Behavior During Therapy: WFL for tasks assessed/performed Overall Cognitive Status: Within Functional Limits for tasks assessed                                 General Comments: slow processing      General Comments      Exercises  Assessment/Plan    PT Assessment Patent does not need any further PT services  PT Problem List Decreased activity tolerance       PT Treatment Interventions      PT Goals (Current goals can be found in the Care Plan section)  Acute Rehab PT Goals Patient Stated Goal: to go home tomorrow PT Goal Formulation: With patient Time For Goal Achievement: 04/05/21 Potential to Achieve Goals: Good    Frequency     Barriers to discharge        Co-evaluation               AM-PAC PT "6 Clicks" Mobility  Outcome Measure Help needed turning from your back to your side while in a flat bed without using bedrails?: None Help needed moving from  lying on your back to sitting on the side of a flat bed without using bedrails?: None Help needed moving to and from a bed to a chair (including a wheelchair)?: None Help needed standing up from a chair using your arms (e.g., wheelchair or bedside chair)?: None Help needed to walk in hospital room?: None Help needed climbing 3-5 steps with a railing? : A Little 6 Click Score: 23    End of Session Equipment Utilized During Treatment: Oxygen Activity Tolerance: Patient tolerated treatment well Patient left: in bed Nurse Communication: Mobility status      Time: 1255-1310 PT Time Calculation (min) (ACUTE ONLY): 15 min   Charges:   PT Evaluation $PT Eval Low Complexity: 1 Low          Kazandra Forstrom, PT, [facility] 04/05/21,1:20 PM

## 2021-04-06 DIAGNOSIS — I272 Pulmonary hypertension, unspecified: Secondary | ICD-10-CM | POA: Diagnosis not present

## 2021-04-06 DIAGNOSIS — I5032 Chronic diastolic (congestive) heart failure: Secondary | ICD-10-CM | POA: Diagnosis not present

## 2021-04-06 DIAGNOSIS — J9601 Acute respiratory failure with hypoxia: Secondary | ICD-10-CM | POA: Diagnosis not present

## 2021-04-06 DIAGNOSIS — J189 Pneumonia, unspecified organism: Secondary | ICD-10-CM | POA: Diagnosis not present

## 2021-04-06 LAB — BASIC METABOLIC PANEL
Anion gap: 6 (ref 5–15)
BUN: 11 mg/dL (ref 6–20)
CO2: 47 mmol/L — ABNORMAL HIGH (ref 22–32)
Calcium: 8.7 mg/dL — ABNORMAL LOW (ref 8.9–10.3)
Chloride: 87 mmol/L — ABNORMAL LOW (ref 98–111)
Creatinine, Ser: 0.69 mg/dL (ref 0.61–1.24)
GFR, Estimated: >60 mL/min (ref >60)
Glucose, Bld: 112 mg/dL — ABNORMAL HIGH (ref 70–99)
Potassium: 3.9 mmol/L (ref 3.5–5.1)
Sodium: 140 mmol/L (ref 135–145)

## 2021-04-06 MED ORDER — ALBUTEROL SULFATE (2.5 MG/3ML) 0.083% IN NEBU
2.5000 mg | INHALATION_SOLUTION | Freq: Two times a day (BID) | RESPIRATORY_TRACT | Status: DC
  Administered 2021-04-06 – 2021-04-07 (×2): 2.5 mg via RESPIRATORY_TRACT
  Filled 2021-04-06 (×2): qty 3

## 2021-04-06 MED ORDER — AMOXICILLIN-POT CLAVULANATE 875-125 MG PO TABS
1.0000 | ORAL_TABLET | Freq: Two times a day (BID) | ORAL | Status: DC
  Administered 2021-04-06 – 2021-04-07 (×3): 1 via ORAL
  Filled 2021-04-06 (×3): qty 1

## 2021-04-06 NOTE — Telephone Encounter (Signed)
Dr Tamala Julian contacted me. Wants him to try rehab. We discussed that. I am back on service at [facility] next week and if patient [name] wants Tamala Julian to contact me again then that is cool

## 2021-04-06 NOTE — Telephone Encounter (Signed)
Mychart message sent by pt letting us know that he is currently in the hospital.  Routing to MR as an FYI.

## 2021-04-06 NOTE — Progress Notes (Signed)
[facility] PROGRESS NOTE    Progress Note  [name]  [id number] DOB: 31-May-1981 DOA: 04/03/2021 PCP: [name], PA-C     Brief Narrative:   [name] is an 40 y.o. male past medical history hyperbaric respiratory failure requiring BiPAP at bedtime, chronic diastolic heart failure pulmonary hypertension cirrhosis of the liver comes to the ED complaining of shortness of breath was found to be in acute respiratory failure with hypoxia and hypercarbia likely due to pneumonia in the setting of restrictive lung disease.  Was started on BiPAP in the ED saturations improved.   Assessment/Plan:   Acute respiratory failure with hypoxia and hypercapnia due to probably pneumonia in the setting of restrictive lung disease and chronic diastolic heart failure: Now satting 95% on 5 L of oxygen which is his baseline at home. We will go ahead and switch him to oral antibiotics monitor overnight if he remains stable can be discharged in the morning.  Chronic diastolic heart failure and pulmonary hypertension: Last 2D echo showed an EF of 50% in June 2022. Negative about 2 L continue IV Lasix and Aldactone. Basic metabolic panels pending continue fluid restriction.  History of asthma: Stable not wheezing.  History of cirrhosis: Cont aldactone.   DVT prophylaxis: lovenox Family Communication:none Status is: Inpatient  Remains inpatient appropriate because:Hemodynamically unstable  Dispo: The patient is from: Home              Anticipated d/c is to: Home              Patient currently is not medically stable to d/c.   Difficult to place patient No        Code Status:     Code Status Orders  (From admission, onward)           Start     Ordered   04/03/21 2206  Full code  Continuous        04/03/21 2206           Code Status History     Date Active Date Inactive Code Status Order ID Comments User Context   04/20/2019 2003 04/27/2019  1919 Full Code [id number]  Brian Bulls, MD Inpatient         IV Access:   Peripheral IV   Procedures and diagnostic studies:   No results found.   Medical Consultants:   None.   Subjective:    [name]  relates breathing is better.  Objective:    Vitals:   04/06/21 0135 04/06/21 0421 04/06/21 0741 04/06/21 0817  BP:  (!) 142/83  138/84  Pulse: 88 91  98  Resp: 20 18    Temp:  98.1 F (36.7 C)    TempSrc:  Oral    SpO2: 99% 98% 94%   Weight:  114.5 kg    Height:       SpO2: 94 % O2 Flow Rate (L/min): 5 L/min FiO2 (%): 40 %   Intake/Output Summary (Last 24 hours) at 04/06/2021 1027 Last data filed at 04/06/2021 0700 Gross per 24 hour  Intake --  Output 600 ml  Net -600 ml    Filed Weights   04/04/21 2100 04/05/21 0625 04/06/21 0421  Weight: 111.2 kg 111.2 kg 114.5 kg    Exam: General exam: In no acute distress. Respiratory system: Good air movement and clear to auscultation. Cardiovascular system: S1 & S2 heard, RRR. No JVD. Gastrointestinal system: Abdomen is nondistended, soft  and nontender.  Extremities: No pedal edema. Skin: No rashes, lesions or ulcers  Data Reviewed:    Labs: Basic Metabolic Panel: Recent Labs  Lab 04/03/21 1220 04/03/21 2317 04/04/21 0336 04/05/21 1208 04/06/21 0229  NA 140  --  140 141 140  K 4.1  --  3.9 4.1 3.9  CL 89*  --  89* 89* 87*  CO2 42*  --  43* 45* 47*  GLUCOSE 128*  --  126* 130* 112*  BUN 8  --  '9 13 11  '$ CREATININE 0.70 0.67 0.70 0.67 0.69  CALCIUM 8.2*  --  8.4* 8.6* 8.7*    GFR Estimated Creatinine Clearance: 168.3 mL/min (by C-G formula based on SCr of 0.69 mg/dL). Liver Function Tests: Recent Labs  Lab 04/03/21 1728  AST 24  ALT 33  ALKPHOS 85  BILITOT 0.6  PROT 8.3*  ALBUMIN 4.4    No results for input(s): LIPASE, AMYLASE in the last 168 hours. No results for input(s): AMMONIA in the last 168 hours. Coagulation profile No results for input(s): INR, PROTIME in  the last 168 hours. COVID-19 Labs  No results for input(s): DDIMER, FERRITIN, LDH, CRP in the last 72 hours.  Lab Results  Component Value Date   SARSCOV2NAA NEGATIVE 04/03/2021   [place] NEGATIVE 04/03/2021   [place] Not Detected 03/05/2019    CBC: Recent Labs  Lab 04/03/21 1400 04/03/21 2317 04/04/21 0336  WBC 10.5 10.5 7.2  HGB 13.3 11.9* 12.1*  HCT 44.7 39.8 40.5  MCV 98.5 98.8 97.8  PLT 174 138* 143*    Cardiac Enzymes: No results for input(s): CKTOTAL, CKMB, CKMBINDEX, TROPONINI in the last 168 hours. BNP (last 3 results) No results for input(s): PROBNP in the last 8760 hours. CBG: No results for input(s): GLUCAP in the last 168 hours. D-Dimer: No results for input(s): DDIMER in the last 72 hours. Hgb A1c: No results for input(s): HGBA1C in the last 72 hours. Lipid Profile: No results for input(s): CHOL, HDL, LDLCALC, TRIG, CHOLHDL, LDLDIRECT in the last 72 hours. Thyroid function studies: No results for input(s): TSH, T4TOTAL, T3FREE, THYROIDAB in the last 72 hours.  Invalid input(s): FREET3 Anemia work up: No results for input(s): VITAMINB12, FOLATE, FERRITIN, TIBC, IRON, RETICCTPCT in the last 72 hours. Sepsis Labs: Recent Labs  Lab 04/03/21 1400 04/03/21 2317 04/04/21 0336 04/04/21 0705 04/05/21 0336  PROCALCITON  --   --   --  <0.10 <0.10  WBC 10.5 10.5 7.2  --   --     Microbiology Recent Results (from the past 240 hour(s))  SARS CORONAVIRUS 2 (TAT 6-24 HRS) Nasopharyngeal Nasopharyngeal Swab     Status: None   Collection Time: 04/03/21  5:25 PM   Specimen: Nasopharyngeal Swab  Result Value Ref Range Status   SARS Coronavirus 2 NEGATIVE NEGATIVE Final    Comment: (NOTE) SARS-CoV-2 target nucleic acids are NOT DETECTED.  The SARS-CoV-2 RNA is generally detectable in upper and lower respiratory specimens during the acute phase of infection. Negative results do not preclude SARS-CoV-2 infection, do not rule out co-infections with  other pathogens, and should not be used as the sole basis for treatment or other patient management decisions. Negative results must be combined with clinical observations, patient history, and epidemiological information. The expected result is Negative.  Fact Sheet for Patients: SugarRoll.be  Fact Sheet for Healthcare Providers: https://www.woods-mathews.com/  This test is not yet approved or cleared by the [place] FDA and  has been authorized for detection and/or diagnosis  of SARS-CoV-2 by FDA under an Emergency Use Authorization (EUA). This EUA will remain  in effect (meaning this test can be used) for the duration of the COVID-19 declaration under Se ction 564(b)(1) of the Act, 21 U.S.C. section 360bbb-3(b)(1), unless the authorization is terminated or revoked sooner.  Performed at [facility], [place] [address] [place], [place] [address]   Culture, blood (routine x 2)     Status: None (Preliminary result)   Collection Time: 04/03/21  5:27 PM   Specimen: BLOOD  Result Value Ref Range Status   Specimen Description BLOOD BLOOD RIGHT FOREARM  Final   Special Requests   Final    BOTTLES DRAWN AEROBIC AND ANAEROBIC Blood Culture results may not be optimal due to an inadequate volume of blood received in culture bottles   Culture   Final    NO GROWTH 2 DAYS Performed at [facility], [place] [address], [place], [place] [address]    Report Status PENDING  Incomplete  Culture, blood (routine x 2)     Status: None (Preliminary result)   Collection Time: 04/03/21  6:52 PM   Specimen: BLOOD  Result Value Ref Range Status   Specimen Description BLOOD SITE NOT SPECIFIED  Final   Special Requests   Final    BOTTLES DRAWN AEROBIC AND ANAEROBIC Blood Culture results may not be optimal due to an inadequate volume of blood received in culture bottles   Culture   Final    NO GROWTH 2 DAYS Performed at [facility], [place]  [address], [place], [place] [address]    Report Status PENDING  Incomplete  Resp Panel by RT-PCR (Flu A&B, Covid) Nasopharyngeal Swab     Status: None   Collection Time: 04/03/21  9:45 PM   Specimen: Nasopharyngeal Swab; Nasopharyngeal(NP) swabs in vial transport medium  Result Value Ref Range Status   SARS Coronavirus 2 by RT PCR NEGATIVE NEGATIVE Final    Comment: (NOTE) SARS-CoV-2 target nucleic acids are NOT DETECTED.  The SARS-CoV-2 RNA is generally detectable in upper respiratory specimens during the acute phase of infection. The lowest concentration of SARS-CoV-2 viral copies this assay can detect is 138 copies/mL. A negative result does not preclude SARS-Cov-2 infection and should not be used as the sole basis for treatment or other patient management decisions. A negative result may occur with  improper specimen collection/handling, submission of specimen other than nasopharyngeal swab, presence of viral mutation(s) within the areas targeted by this assay, and inadequate number of viral copies(<138 copies/mL). A negative result must be combined with clinical observations, patient history, and epidemiological information. The expected result is Negative.  Fact Sheet for Patients:  EntrepreneurPulse.com.au  Fact Sheet for Healthcare Providers:  IncredibleEmployment.be  This test is no t yet approved or cleared by the [place] FDA and  has been authorized for detection and/or diagnosis of SARS-CoV-2 by FDA under an Emergency Use Authorization (EUA). This EUA will remain  in effect (meaning this test can be used) for the duration of the COVID-19 declaration under Section 564(b)(1) of the Act, 21 U.S.C.section 360bbb-3(b)(1), unless the authorization is terminated  or revoked sooner.       Influenza A by PCR NEGATIVE NEGATIVE Final   Influenza B by PCR NEGATIVE NEGATIVE Final    Comment: (NOTE) The Xpert Xpress SARS-CoV-2/FLU/RSV plus  assay is intended as an aid in the diagnosis of influenza from Nasopharyngeal swab specimens and should not be used as a sole basis  for treatment. Nasal washings and aspirates are unacceptable for Xpert Xpress SARS-CoV-2/FLU/RSV testing.  Fact Sheet for Patients: EntrepreneurPulse.com.au  Fact Sheet for Healthcare Providers: IncredibleEmployment.be  This test is not yet approved or cleared by the [place] FDA and has been authorized for detection and/or diagnosis of SARS-CoV-2 by FDA under an Emergency Use Authorization (EUA). This EUA will remain in effect (meaning this test can be used) for the duration of the COVID-19 declaration under Section 564(b)(1) of the Act, 21 U.S.C. section 360bbb-3(b)(1), unless the authorization is terminated or revoked.  Performed at [facility], [place] [address], [place], [place] [address]      Medications:    albuterol  2.5 mg Nebulization Q6H   azithromycin  500 mg Oral QPM   bisoprolol  5 mg Oral Daily   busPIRone  15 mg Oral TID   dapagliflozin propanediol  10 mg Oral QAC breakfast   enoxaparin (LOVENOX) injection  40 mg Subcutaneous Q24H   FLUoxetine  20 mg Oral Daily   [START ON 04/07/2021] furosemide  20 mg Oral QPM   [START ON 04/07/2021] furosemide  40 mg Oral Daily   isosorbide mononitrate  30 mg Oral Daily   mirtazapine  15 mg Oral QHS   pantoprazole  40 mg Oral Daily   potassium chloride  40 mEq Oral BID   spironolactone  25 mg Oral Daily   Continuous Infusions:  cefTRIAXone (ROCEPHIN)  IV 2 g (04/06/21 0943)      LOS: 3 days   Charlynne Cousins  [facility]  04/06/2021, 10:27 AM

## 2021-04-06 NOTE — Progress Notes (Signed)
   04/06/21 2016  BiPAP/CPAP/SIPAP  BiPAP/CPAP/SIPAP Pt Type Adult  Mask Type Full face mask  Mask Size Large  Set Rate 13 breaths/min  Respiratory Rate 26 breaths/min  IPAP 12 cmH20  EPAP 6 cmH2O  Minute Ventilation 11.5  Leak 89  Peak Inspiratory Pressure (PIP) 12  Tidal Volume (Vt) 480  BiPAP/CPAP/SIPAP BiPAP  Press High Alarm 25 cmH2O  Press Low Alarm 5 cmH2O

## 2021-04-07 DIAGNOSIS — I272 Pulmonary hypertension, unspecified: Secondary | ICD-10-CM | POA: Diagnosis not present

## 2021-04-07 DIAGNOSIS — I5032 Chronic diastolic (congestive) heart failure: Secondary | ICD-10-CM | POA: Diagnosis not present

## 2021-04-07 DIAGNOSIS — J9601 Acute respiratory failure with hypoxia: Secondary | ICD-10-CM | POA: Diagnosis not present

## 2021-04-07 DIAGNOSIS — J984 Other disorders of lung: Secondary | ICD-10-CM

## 2021-04-07 DIAGNOSIS — J189 Pneumonia, unspecified organism: Secondary | ICD-10-CM | POA: Diagnosis not present

## 2021-04-07 LAB — BASIC METABOLIC PANEL
Anion gap: 5 (ref 5–15)
BUN: 10 mg/dL (ref 6–20)
CO2: 45 mmol/L — ABNORMAL HIGH (ref 22–32)
Calcium: 8.8 mg/dL — ABNORMAL LOW (ref 8.9–10.3)
Chloride: 90 mmol/L — ABNORMAL LOW (ref 98–111)
Creatinine, Ser: 0.77 mg/dL (ref 0.61–1.24)
GFR, Estimated: >60 mL/min (ref >60)
Glucose, Bld: 94 mg/dL (ref 70–99)
Potassium: 4.7 mmol/L (ref 3.5–5.1)
Sodium: 140 mmol/L (ref 135–145)

## 2021-04-07 MED ORDER — AZITHROMYCIN 250 MG PO TABS: ORAL_TABLET | ORAL | 0 refills | Status: DC

## 2021-04-07 MED ORDER — AMOXICILLIN-POT CLAVULANATE 875-125 MG PO TABS: 1.0000 | ORAL_TABLET | Freq: Two times a day (BID) | ORAL | 0 refills | Status: AC

## 2021-04-07 NOTE — Discharge Summary (Signed)
Physician Discharge Summary  Brian ARNOTT II [id number] DOB: [birth year] [name] DOA: 04/03/2021  PCP: [name], PA-C  Admit date: 04/03/2021 Discharge date: 04/07/2021  Admitted From: Home  Disposition:  home  Recommendations for Outpatient Follow-up:  Follow up with PCP in 1-2 weeks Please obtain BMP/CBC in one week   Home Health:no Equipment/Devices:none  Discharge Condition:Guarded CODE STATUS:Full Diet recommendation: Heart Healthy  Brief/Interim Summary: 40 y.o. male past medical history hyperbaric respiratory failure requiring BiPAP at bedtime, chronic diastolic heart failure pulmonary hypertension cirrhosis of the liver comes to the ED complaining of shortness of breath was found to be in acute respiratory failure with hypoxia and hypercarbia likely due to pneumonia in the setting of restrictive lung disease.  Was started on BiPAP in the ED saturations improved.  Discharge Diagnoses:  Principal Problem:   Acute respiratory failure with hypoxia and hypercapnia (HCC) Active Problems:   Pulmonary hypertension (HCC)   Restrictive lung disease   Chronic diastolic CHF (congestive heart failure) (HCC)   Pectus excavatum   CAP (community acquired pneumonia)   Acute on chronic respiratory failure with hypoxia (HCC)  Acute respiratory failure with hypoxia and hypercarbia due to probably pneumonia in the setting of restrictive lung disease and chronic diastolic heart failure: Had to be placed on BiPAP in the ED started empirically on antibiotics he was weaned down to his baseline of 5 L of oxygen at home. He was transitioned to oral antibiotic regimen continue as an outpatient.  Chronic diastolic heart failure pulmonary hypertension: He was diuresed about 2 L his home regimen was resumed he will continue as an outpatient no changes made.  History of asthma: Appears stable no wheezing at the time of admission.  History of cirrhosis: Continue Aldactone.  Discharge  Instructions  Discharge Instructions     Diet - low sodium heart healthy   Complete by: As directed    Increase activity slowly   Complete by: As directed       Allergies as of 04/07/2021       Reactions   Other Itching, Other (See Comments)   Pet Dander        Medication List     TAKE these medications    acetaminophen 500 MG tablet Commonly known as: TYLENOL Take 1,000 mg by mouth every 6 (six) hours as needed for mild pain.   albuterol 108 (90 Base) MCG/ACT inhaler Commonly known as: ProAir HFA Inhale 2 puffs into the lungs every 6 (six) hours as needed for wheezing or shortness of breath.   amoxicillin-clavulanate 875-125 MG tablet Commonly known as: AUGMENTIN Take 1 tablet by mouth every 12 (twelve) hours for 2 days.   azithromycin 250 MG tablet Commonly known as: ZITHROMAX Take 1 tab daily   bisoprolol 5 MG tablet Commonly known as: ZEBETA TAKE 1 TABLET BY MOUTH EVERY DAY   busPIRone 15 MG tablet Commonly known as: BUSPAR Take 15 mg by mouth 3 (three) times daily.   cetirizine 10 MG tablet Commonly known as: ZYRTEC Take 1 tablet (10 mg total) by mouth daily.   dapagliflozin propanediol 10 MG Tabs tablet Commonly known as: Farxiga Take 1 tablet (10 mg total) by mouth daily before breakfast.   FLUoxetine 20 MG capsule Commonly known as: PROZAC Take 20 mg by mouth daily.   furosemide 20 MG tablet Commonly known as: LASIX TAKE 2 TABLETS (40 MG TOTAL) BY MOUTH EVERY MORNING AND 1 TABLET (20 MG TOTAL) EVERY EVENING. NEEDS DOCTOR APPOINTMENT FOR FURTHER REFILL.Marland Kitchen  isosorbide mononitrate 30 MG 24 hr tablet Commonly known as: IMDUR TAKE 1 TABLET (30 MG TOTAL) BY MOUTH DAILY. NEED APPT FOR FUTURE REFILLS [phone] What changed: See the new instructions.   Melatonin 10 MG Tabs Take 10 mg by mouth daily as needed (sleep).   mirtazapine 15 MG tablet Commonly known as: REMERON Take 15 mg by mouth at bedtime.   omeprazole 20 MG tablet Commonly  known as: PRILOSEC OTC Take 20 mg by mouth daily.   Potassium Chloride ER 20 MEQ Tbcr TAKE 2 TABLETS BY MOUTH EVERY DAY What changed:  how much to take how to take this when to take this additional instructions   spironolactone 25 MG tablet Commonly known as: ALDACTONE TAKE 1 TABLET BY MOUTH EVERY DAY        Allergies  Allergen Reactions   Other Itching and Other (See Comments)    Pet Dander    Consultations: Pulmonary and critical care   Procedures/Studies: DG Chest 2 View  Result Date: 04/03/2021 CLINICAL DATA:  Shortness of breath.  History of asthma. EXAM: CHEST - 2 VIEW COMPARISON:  09/29/2019. FINDINGS: New/increased consolidation in the left greater than right perihilar regions and left lung base, which is superimposed on chronic bilateral perihilar opacities. Enlarged cardiac silhouette that is partly obscured. Central pulmonary vascular dilation. No visible pleural effusions or pneumothorax. Chronic pectus deformity. Markedly dilated stomach, partially imaged. IMPRESSION: 1. New/increased left greater than right perihilar and left basilar consolidation, which is superimposed on chronic perihilar opacities. 2. Chronic cardiomegaly and dilated central pulmonary vasculature. 3. Markedly dilated stomach, partially imaged. Considered decompression. Electronically Signed   By: Margaretha Sheffield MD   On: 04/03/2021 13:43   CT Head Wo Contrast  Result Date: 04/03/2021 CLINICAL DATA:  40 year old male with headache. EXAM: CT HEAD WITHOUT CONTRAST TECHNIQUE: Contiguous axial images were obtained from the base of the skull through the vertex without intravenous contrast. COMPARISON:  CT of the paranasal sinuses dated 08/07/2011. FINDINGS: Brain: The ventricles and sulci are appropriate size for patient's age. The gray-white matter discrimination is preserved. There is no acute intracranial hemorrhage. No mass effect or midline shift no extra-axial fluid collection. Vascular: No  hyperdense vessel or unexpected calcification. Skull: Normal. Negative for fracture or focal lesion. Sinuses/Orbits: No acute finding. Other: None IMPRESSION: Unremarkable noncontrast CT of the brain. Electronically Signed   By: Anner Crete M.D.   On: 04/03/2021 21:20   CT Chest W Contrast  Result Date: 04/03/2021 CLINICAL DATA:  Headache and abnormal chest x-ray. EXAM: CT CHEST WITH CONTRAST TECHNIQUE: Multidetector CT imaging of the chest was performed during intravenous contrast administration. CONTRAST:  66m OMNIPAQUE IOHEXOL 300 MG/ML  SOLN COMPARISON:  April 21, 2019 FINDINGS: Cardiovascular: There is stable dilatation of the main pulmonary artery (5.1 cm). The pulmonary arteries are limited in evaluation secondary to suboptimal opacification with intravenous contrast. No intraluminal filling defects are identified. There is marked severity cardiomegaly. Mass effect from an existing pectus deformity is again noted. No pericardial effusion. Mediastinum/Nodes: No enlarged mediastinal, hilar, or axillary lymph nodes. Thyroid gland, trachea, and esophagus demonstrate no significant findings. Lungs/Pleura: Stable moderate to marked severity areas of consolidation and/or atelectasis are seen within the right middle lobe, right lower lobe and throughout the left lung. Associated bronchiectasis is again seen with stable left-sided volume loss. A stable 7 mm noncalcified lung nodule is seen within the lateral aspect of the right lower lobe. There is no evidence of a pleural effusion or pneumothorax. Upper Abdomen:  No acute abnormality. Musculoskeletal: A pectus deformity of the anterior chest wall is seen. IMPRESSION: 1. Stable moderate to marked severity areas of bilateral consolidation and atelectasis, which may represent sequelae associated with a chronic infectious or chronic inflammatory process. 2. Stable 7 mm noncalcified right lower lobe lung nodule. Non-contrast chest CT at 6-12 months is  recommended. If the nodule is stable at time of repeat CT, then future CT at 18-24 months (from today's scan) is considered optional for low-risk patients, but is recommended for high-risk patients. This recommendation follows the consensus statement: Guidelines for Management of Incidental Pulmonary Nodules Detected on CT Images: From the Fleischner Society 2017; Radiology 2017; 284:228-243. 3. Pectus excavatum deformity with subsequent mass effect on the heart. 4. Stable central pulmonary arterial enlargement consistent with pulmonary arterial hypertension. Electronically Signed   By: Virgina Norfolk M.D.   On: 04/03/2021 21:31   (Echo, Carotid, EGD, Colonoscopy, ERCP)    Subjective: No complaints.  Discharge Exam: Vitals:   04/07/21 0500 04/07/21 0733  BP: (!) 154/98   Pulse: 81 86  Resp: (!) 27 (!) 25  Temp: 97.9 F (36.6 C)   SpO2: 96% 95%   Vitals:   04/06/21 1546 04/06/21 2130 04/07/21 0500 04/07/21 0733  BP: 131/75 131/78 (!) 154/98   Pulse: 80 78 81 86  Resp: 13 18 (!) 27 (!) 25  Temp: (!) 97.5 F (36.4 C) 98.5 F (36.9 C) 97.9 F (36.6 C)   TempSrc: Axillary Oral Axillary   SpO2: 99% 98% 96% 95%  Weight:   113.5 kg   Height:        General: Pt is alert, awake, not in acute distress Cardiovascular: RRR, S1/S2 +, no rubs, no gallops Respiratory: CTA bilaterally, no wheezing, no rhonchi Abdominal: Soft, NT, ND, bowel sounds + Extremities: no edema, no cyanosis    The results of significant diagnostics from this hospitalization (including imaging, microbiology, ancillary and laboratory) are listed below for reference.     Microbiology: Recent Results (from the past 240 hour(s))  SARS CORONAVIRUS 2 (TAT 6-24 HRS) Nasopharyngeal Nasopharyngeal Swab     Status: None   Collection Time: 04/03/21  5:25 PM   Specimen: Nasopharyngeal Swab  Result Value Ref Range Status   SARS Coronavirus 2 NEGATIVE NEGATIVE Final    Comment: (NOTE) SARS-CoV-2 target nucleic acids  are NOT DETECTED.  The SARS-CoV-2 RNA is generally detectable in upper and lower respiratory specimens during the acute phase of infection. Negative results do not preclude SARS-CoV-2 infection, do not rule out co-infections with other pathogens, and should not be used as the sole basis for treatment or other patient management decisions. Negative results must be combined with clinical observations, patient history, and epidemiological information. The expected result is Negative.  Fact Sheet for Patients: SugarRoll.be  Fact Sheet for Healthcare Providers: https://www.woods-mathews.com/  This test is not yet approved or cleared by the [place] FDA and  has been authorized for detection and/or diagnosis of SARS-CoV-2 by FDA under an Emergency Use Authorization (EUA). This EUA will remain  in effect (meaning this test can be used) for the duration of the COVID-19 declaration under Se ction 564(b)(1) of the Act, 21 U.S.C. section 360bbb-3(b)(1), unless the authorization is terminated or revoked sooner.  Performed at [facility], [place] [address], [place], [place] [address]   Culture, blood (routine x 2)     Status: None (Preliminary result)   Collection Time: 04/03/21  5:27 PM   Specimen: BLOOD  Result  Value Ref Range Status   Specimen Description BLOOD BLOOD RIGHT FOREARM  Final   Special Requests   Final    BOTTLES DRAWN AEROBIC AND ANAEROBIC Blood Culture results may not be optimal due to an inadequate volume of blood received in culture bottles   Culture   Final    NO GROWTH 2 DAYS Performed at [facility], [place] [address] [place], [place] [address]    Report Status PENDING  Incomplete  Culture, blood (routine x 2)     Status: None (Preliminary result)   Collection Time: 04/03/21  6:52 PM   Specimen: BLOOD  Result Value Ref Range Status   Specimen Description BLOOD SITE NOT SPECIFIED  Final   Special Requests    Final    BOTTLES DRAWN AEROBIC AND ANAEROBIC Blood Culture results may not be optimal due to an inadequate volume of blood received in culture bottles   Culture   Final    NO GROWTH 2 DAYS Performed at [facility], [place] [address], [place], [place] [address]    Report Status PENDING  Incomplete  Resp Panel by RT-PCR (Flu A&B, Covid) Nasopharyngeal Swab     Status: None   Collection Time: 04/03/21  9:45 PM   Specimen: Nasopharyngeal Swab; Nasopharyngeal(NP) swabs in vial transport medium  Result Value Ref Range Status   SARS Coronavirus 2 by RT PCR NEGATIVE NEGATIVE Final    Comment: (NOTE) SARS-CoV-2 target nucleic acids are NOT DETECTED.  The SARS-CoV-2 RNA is generally detectable in upper respiratory specimens during the acute phase of infection. The lowest concentration of SARS-CoV-2 viral copies this assay can detect is 138 copies/mL. A negative result does not preclude SARS-Cov-2 infection and should not be used as the sole basis for treatment or other patient management decisions. A negative result may occur with  improper specimen collection/handling, submission of specimen other than nasopharyngeal swab, presence of viral mutation(s) within the areas targeted by this assay, and inadequate number of viral copies(<138 copies/mL). A negative result must be combined with clinical observations, patient history, and epidemiological information. The expected result is Negative.  Fact Sheet for Patients:  EntrepreneurPulse.com.au  Fact Sheet for Healthcare Providers:  IncredibleEmployment.be  This test is no t yet approved or cleared by the [place] FDA and  has been authorized for detection and/or diagnosis of SARS-CoV-2 by FDA under an Emergency Use Authorization (EUA). This EUA will remain  in effect (meaning this test can be used) for the duration of the COVID-19 declaration under Section 564(b)(1) of the Act, 21 U.S.C.section  360bbb-3(b)(1), unless the authorization is terminated  or revoked sooner.       Influenza A by PCR NEGATIVE NEGATIVE Final   Influenza B by PCR NEGATIVE NEGATIVE Final    Comment: (NOTE) The Xpert Xpress SARS-CoV-2/FLU/RSV plus assay is intended as an aid in the diagnosis of influenza from Nasopharyngeal swab specimens and should not be used as a sole basis for treatment. Nasal washings and aspirates are unacceptable for Xpert Xpress SARS-CoV-2/FLU/RSV testing.  Fact Sheet for Patients: EntrepreneurPulse.com.au  Fact Sheet for Healthcare Providers: IncredibleEmployment.be  This test is not yet approved or cleared by the [place] FDA and has been authorized for detection and/or diagnosis of SARS-CoV-2 by FDA under an Emergency Use Authorization (EUA). This EUA will remain in effect (meaning this test can be used) for the duration of the COVID-19 declaration under Section 564(b)(1) of the Act, 21 U.S.C. section 360bbb-3(b)(1), unless the authorization is terminated  or revoked.  Performed at [facility], [place] [address], [place], [place] [address]      Labs: BNP (last 3 results) Recent Labs    04/18/20 1244 02/14/21 1151 04/03/21 1400  BNP 36.8 18.5 [id number]   Basic Metabolic Panel: Recent Labs  Lab 04/03/21 1220 04/03/21 2317 04/04/21 0336 04/05/21 1208 04/06/21 0229 04/07/21 0040  NA 140  --  140 141 140 140  K 4.1  --  3.9 4.1 3.9 4.7  CL 89*  --  89* 89* 87* 90*  CO2 42*  --  43* 45* 47* 45*  GLUCOSE 128*  --  126* 130* 112* 94  BUN 8  --  '9 13 11 10  '$ CREATININE 0.70 0.67 0.70 0.67 0.69 0.77  CALCIUM 8.2*  --  8.4* 8.6* 8.7* 8.8*   Liver Function Tests: Recent Labs  Lab 04/03/21 1728  AST 24  ALT 33  ALKPHOS 85  BILITOT 0.6  PROT 8.3*  ALBUMIN 4.4   No results for input(s): LIPASE, AMYLASE in the last 168 hours. No results for input(s): AMMONIA in the last 168 hours. CBC: Recent Labs  Lab  04/03/21 1400 04/03/21 2317 04/04/21 0336  WBC 10.5 10.5 7.2  HGB 13.3 11.9* 12.1*  HCT 44.7 39.8 40.5  MCV 98.5 98.8 97.8  PLT 174 138* 143*   Cardiac Enzymes: No results for input(s): CKTOTAL, CKMB, CKMBINDEX, TROPONINI in the last 168 hours. BNP: Invalid input(s): POCBNP CBG: No results for input(s): GLUCAP in the last 168 hours. D-Dimer No results for input(s): DDIMER in the last 72 hours. Hgb A1c No results for input(s): HGBA1C in the last 72 hours. Lipid Profile No results for input(s): CHOL, HDL, LDLCALC, TRIG, CHOLHDL, LDLDIRECT in the last 72 hours. Thyroid function studies No results for input(s): TSH, T4TOTAL, T3FREE, THYROIDAB in the last 72 hours.  Invalid input(s): FREET3 Anemia work up No results for input(s): VITAMINB12, FOLATE, FERRITIN, TIBC, IRON, RETICCTPCT in the last 72 hours. Urinalysis No results found for: COLORURINE, APPEARANCEUR, [place], [place], [place], [place], [place], [place], PROTEINUR, UROBILINOGEN, NITRITE, LEUKOCYTESUR Sepsis Labs Invalid input(s): PROCALCITONIN,  WBC,  LACTICIDVEN Microbiology Recent Results (from the past 240 hour(s))  SARS CORONAVIRUS 2 (TAT 6-24 HRS) Nasopharyngeal Nasopharyngeal Swab     Status: None   Collection Time: 04/03/21  5:25 PM   Specimen: Nasopharyngeal Swab  Result Value Ref Range Status   SARS Coronavirus 2 NEGATIVE NEGATIVE Final    Comment: (NOTE) SARS-CoV-2 target nucleic acids are NOT DETECTED.  The SARS-CoV-2 RNA is generally detectable in upper and lower respiratory specimens during the acute phase of infection. Negative results do not preclude SARS-CoV-2 infection, do not rule out co-infections with other pathogens, and should not be used as the sole basis for treatment or other patient management decisions. Negative results must be combined with clinical observations, patient history, and epidemiological information. The expected result is Negative.  Fact Sheet for  Patients: SugarRoll.be  Fact Sheet for Healthcare Providers: https://www.woods-mathews.com/  This test is not yet approved or cleared by the [place] FDA and  has been authorized for detection and/or diagnosis of SARS-CoV-2 by FDA under an Emergency Use Authorization (EUA). This EUA will remain  in effect (meaning this test can be used) for the duration of the COVID-19 declaration under Se ction 564(b)(1) of the Act, 21 U.S.C. section 360bbb-3(b)(1), unless the authorization is terminated or revoked sooner.  Performed at [facility], [place] [address] [place], [place] [address]   Culture,  blood (routine x 2)     Status: None (Preliminary result)   Collection Time: 04/03/21  5:27 PM   Specimen: BLOOD  Result Value Ref Range Status   Specimen Description BLOOD BLOOD RIGHT FOREARM  Final   Special Requests   Final    BOTTLES DRAWN AEROBIC AND ANAEROBIC Blood Culture results may not be optimal due to an inadequate volume of blood received in culture bottles   Culture   Final    NO GROWTH 2 DAYS Performed at [facility], [place] [address] [place], [place] [address]    Report Status PENDING  Incomplete  Culture, blood (routine x 2)     Status: None (Preliminary result)   Collection Time: 04/03/21  6:52 PM   Specimen: BLOOD  Result Value Ref Range Status   Specimen Description BLOOD SITE NOT SPECIFIED  Final   Special Requests   Final    BOTTLES DRAWN AEROBIC AND ANAEROBIC Blood Culture results may not be optimal due to an inadequate volume of blood received in culture bottles   Culture   Final    NO GROWTH 2 DAYS Performed at [facility], [place] [address] [place], [place] [address]    Report Status PENDING  Incomplete  Resp Panel by RT-PCR (Flu A&B, Covid) Nasopharyngeal Swab     Status: None   Collection Time: 04/03/21  9:45 PM   Specimen: Nasopharyngeal Swab; Nasopharyngeal(NP) swabs in vial transport medium  Result  Value Ref Range Status   SARS Coronavirus 2 by RT PCR NEGATIVE NEGATIVE Final    Comment: (NOTE) SARS-CoV-2 target nucleic acids are NOT DETECTED.  The SARS-CoV-2 RNA is generally detectable in upper respiratory specimens during the acute phase of infection. The lowest concentration of SARS-CoV-2 viral copies this assay can detect is 138 copies/mL. A negative result does not preclude SARS-Cov-2 infection and should not be used as the sole basis for treatment or other patient management decisions. A negative result may occur with  improper specimen collection/handling, submission of specimen other than nasopharyngeal swab, presence of viral mutation(s) within the areas targeted by this assay, and inadequate number of viral copies(<138 copies/mL). A negative result must be combined with clinical observations, patient history, and epidemiological information. The expected result is Negative.  Fact Sheet for Patients:  EntrepreneurPulse.com.au  Fact Sheet for Healthcare Providers:  IncredibleEmployment.be  This test is no t yet approved or cleared by the [place] FDA and  has been authorized for detection and/or diagnosis of SARS-CoV-2 by FDA under an Emergency Use Authorization (EUA). This EUA will remain  in effect (meaning this test can be used) for the duration of the COVID-19 declaration under Section 564(b)(1) of the Act, 21 U.S.C.section 360bbb-3(b)(1), unless the authorization is terminated  or revoked sooner.       Influenza A by PCR NEGATIVE NEGATIVE Final   Influenza B by PCR NEGATIVE NEGATIVE Final    Comment: (NOTE) The Xpert Xpress SARS-CoV-2/FLU/RSV plus assay is intended as an aid in the diagnosis of influenza from Nasopharyngeal swab specimens and should not be used as a sole basis for treatment. Nasal washings and aspirates are unacceptable for Xpert Xpress SARS-CoV-2/FLU/RSV testing.  Fact Sheet for  Patients: EntrepreneurPulse.com.au  Fact Sheet for Healthcare Providers: IncredibleEmployment.be  This test is not yet approved or cleared by the [place] FDA and has been authorized for detection and/or diagnosis of SARS-CoV-2 by FDA under an Emergency Use Authorization (EUA). This EUA will remain in effect (meaning this  test can be used) for the duration of the COVID-19 declaration under Section 564(b)(1) of the Act, 21 U.S.C. section 360bbb-3(b)(1), unless the authorization is terminated or revoked.  Performed at [facility], [place] [address] [place], [place] [address]      Time coordinating discharge: Over 30 minutes  SIGNED:   Charlynne Cousins, MD  [facility] 04/07/2021, 7:52 AM Pager   If 7PM-7AM, please contact night-coverage [email or web address] Password [email or web address]

## 2021-04-07 NOTE — Plan of Care (Signed)
  Problem: Clinical Measurements: Goal: Diagnostic test results will improve Outcome: Progressing   Problem: Nutrition: Goal: Adequate nutrition will be maintained Outcome: Progressing   Problem: Activity: Goal: Risk for activity intolerance will decrease Outcome: Progressing   Problem: Clinical Measurements: Goal: Diagnostic test results will improve Outcome: Progressing   Problem: Pain Managment: Goal: General experience of comfort will improve Outcome: Progressing   Problem: Safety: Goal: Ability to remain free from injury will improve Outcome: Progressing   Problem: Skin Integrity: Goal: Risk for impaired skin integrity will decrease Outcome: Progressing

## 2021-04-08 LAB — CULTURE, BLOOD (ROUTINE X 2)
Culture: NO GROWTH
Culture: NO GROWTH

## 2021-04-09 ENCOUNTER — Telehealth (HOSPITAL_COMMUNITY): Payer: Self-pay | Admitting: *Deleted

## 2021-04-09 NOTE — Telephone Encounter (Signed)
Received referral for this pt to participate in Pulmonary Rehab. Pt discharged from hospital on 7/30. Follow up with pulmonary 9/19.  Noted that pt lives in [place].  [facility] has a pulmonary rehab program. Called and left message for pt to determine his preference in location.  Requested call back. Cherre Huger, BSN Cardiac and Training and development officer

## 2021-04-11 NOTE — Progress Notes (Signed)
$'@Patient'F$  ID: [name], male    DOB: 1980-09-29, 40 y.o.   MRN: [id number]  Chief Complaint  Patient presents with   Hospitalization Follow-up    Patient stated he is feeling better since D/C, back to baseline    Referring provider: [name], PA-C  HPI: 40 year old male, former smoker quit 2008 (1 pack year history).  Past medical history significant for pectus excavatum, restrictive lung disease, chronic respiratory failure, diastolic CHF, pulmonary hypertension (group 2, group 3). Former patient of Dr. Lake Bells, now established with Dr. Creed Copper.    He has had 2 reconstructive surgeries for his pectus excavatum, first in childhood at the age of 45 (Dr. Lilia Pro in [place]) and again in 2011 with [facility].  He was reevaluated in 2019 for further surgical options at [facility] where it was determined that there were none.  He is maintained on continuous oxygen 24/7 at 3 L and Trilogy ventilator QHS.  Compliance with oxygen has always been a little bit of a struggle.  He also follows with heart failure clinic and is on oral Lasix.   Previous [facility] pulmonary encounter: 08/01/2020 Patient contacted today for video visit to renew oxygen. Patient called our office on 07/28/20 stating that his oxygen concentrator from a friend stopped working on 07/25/20. A new order was sent to his DME company but they never received it. Per [employer] he returned his oxygen equipment in June 2020 stating that he no longer needed it and signed an AMA. He needs OV today for O2 re-qualification to restart oxygen. He uses 3-4L oxygen continuously. He is wearing Triology ventilator at night. Confirmed O2 86% at rest by video. Continue pulmonary rehab.   08/08/2020 Patient presents today for 1 week follow-up chronic respiratory failure. He is doing well. He has a slight np cough, mostly related to PND symptoms. States that he was outside at a football game Friday 08/04/20. No hospitalizations  since last December 2020 when he had redo pectus repair surgery in [place]. He is 100% compliant with oxygen use and Trilogy ventilator. He uses 3-4L oxygen at rest and 4L on exertion. He will occasionally pump O2 up to 6L with heavy exercise. Patient clarified today that he is looking to purchase a portable oxygen concentrator. He received a new machine last week but needs order and qualifying walk today. Patient is looking to buy POC outright and not rent it as he has already met his deductible. He is scheduled for routine follow-up with Dr. [name] in January 2021. He needs ABGs prior.    04/13/2021- Interim hx  Patient presents today for hospital follow-up. He was admitted from 04/03/21-04/07/21 for acute respiratory failure with hypoxia and hypercapnia d/t probable pneumonia in the setting of restrictive lung disease and chronic diastolic heart failure. He was initially treated with BIPAP and started on empiric antibiotics. Weaned to baseline 5L and transitioned back to oral antibiotics.   He is doing well today, states that his breathing is better. He has chronic chest discomfort/chest tightness. He is using Trilogy at night as directed, wearing on average 8-10 hours a night. Baseline O2 5L pulsed. He would like to switch rehab programs, he is current attending [facility]. He is currently on disability. Completed additional paperwork today. Denies fever, chills, confusion, cough, wheezing, N/V/D.   Allergies  Allergen Reactions   Other Itching and Other (See Comments)    Pet Dander    Immunization History  Administered Date(s) Administered  Influenza Split 06/16/2012, 06/27/2012, 06/08/2013, 06/09/2014, 06/28/2014, 08/10/2015, 05/28/2018, 05/11/2019   Influenza Whole 06/09/2010, 06/10/2011   Influenza,inj,Quad PF,6+ Mos 06/08/2013, 08/10/2015, 05/28/2018, 05/11/2019   Influenza,inj,Quad PF,6-35 Mos 05/29/2020   Influenza,inj,quad, With Preservative 06/08/2013,  08/10/2015   Influenza-Unspecified 06/09/2014, 05/22/2020   Moderna Sars-Covid-2 Vaccination 11/26/2019, 12/27/2019   Tdap 09/10/2009    Past Medical History:  Diagnosis Date   Asthma    CHF (congestive heart failure) (HCC)    Chronic hypercapnic respiratory failure (HCC)    nocturnal  hypoxemic resp failure, on bipap   Pectus excavatum    Restrictive lung disease     Tobacco History: Social History   Tobacco Use  Smoking Status Former   Packs/day: 0.10   Years: 1.00   Pack years: 0.10   Types: Cigarettes   Quit date: 09/09/2006   Years since quitting: 14.6  Smokeless Tobacco Never   Counseling given: Yes   Outpatient Medications Prior to Visit  Medication Sig Dispense Refill   acetaminophen (TYLENOL) 500 MG tablet Take 1,000 mg by mouth every 6 (six) hours as needed for mild pain.     albuterol (PROAIR HFA) 108 (90 BASE) MCG/ACT inhaler Inhale 2 puffs into the lungs every 6 (six) hours as needed for wheezing or shortness of breath. 8.5 each 0   bisoprolol (ZEBETA) 5 MG tablet TAKE 1 TABLET BY MOUTH EVERY DAY 90 tablet 3   busPIRone (BUSPAR) 15 MG tablet Take 15 mg by mouth 3 (three) times daily.     cetirizine (ZYRTEC) 10 MG tablet Take 1 tablet (10 mg total) by mouth daily. 30 tablet 2   dapagliflozin propanediol (FARXIGA) 10 MG TABS tablet Take 1 tablet (10 mg total) by mouth daily before breakfast. 90 tablet 3   FLUoxetine (PROZAC) 20 MG capsule Take 20 mg by mouth daily.     furosemide (LASIX) 20 MG tablet TAKE 2 TABLETS (40 MG TOTAL) BY MOUTH EVERY MORNING AND 1 TABLET (20 MG TOTAL) EVERY EVENING. NEEDS DOCTOR APPOINTMENT FOR FURTHER REFILL.. 270 tablet 1   isosorbide mononitrate (IMDUR) 30 MG 24 hr tablet TAKE 1 TABLET (30 MG TOTAL) BY MOUTH DAILY. NEED APPT FOR FUTURE REFILLS [phone] 90 tablet 3   Melatonin 10 MG TABS Take 10 mg by mouth daily as needed (sleep).     mirtazapine (REMERON) 15 MG tablet Take 15 mg by mouth at bedtime.     omeprazole (PRILOSEC OTC)  20 MG tablet Take 20 mg by mouth daily.     Potassium Chloride ER 20 MEQ TBCR TAKE 2 TABLETS BY MOUTH EVERY DAY 180 tablet 3   spironolactone (ALDACTONE) 25 MG tablet TAKE 1 TABLET BY MOUTH EVERY DAY 90 tablet 1   azithromycin (ZITHROMAX) 250 MG tablet Take 1 tab daily 1 each 0   No facility-administered medications prior to visit.   Review of Systems  Review of Systems  Constitutional: Negative.   HENT: Negative.    Respiratory:  Negative for cough, chest tightness, shortness of breath and wheezing.     Physical Exam  BP 124/64 (BP Location: Left Arm, Cuff Size: Normal)   Pulse 63   Temp 98.2 F (36.8 C) (Oral)   Ht $R'6\' 4"'xp$  (1.93 m)   Wt 245 lb 9.6 oz (111.4 kg)   SpO2 93%   BMI 29.90 kg/m  Physical Exam Constitutional:      General: He is not in acute distress.    Appearance: Normal appearance. He is not ill-appearing.  HENT:     Head:  Normocephalic and atraumatic.  Cardiovascular:     Rate and Rhythm: Normal rate and regular rhythm.  Pulmonary:     Effort: Pulmonary effort is normal.     Breath sounds: Normal breath sounds. No wheezing, rhonchi or rales.     Comments: Concave chest s/p pectus surgery; 5L POC  Neurological:     General: No focal deficit present.     Mental Status: He is alert and oriented to person, place, and time. Mental status is at baseline.  Psychiatric:        Mood and Affect: Mood normal.        Behavior: Behavior normal.        Thought Content: Thought content normal.        Judgment: Judgment normal.     Lab Results:  CBC    Component Value Date/Time   WBC 7.2 04/04/2021 0336   RBC 4.14 (L) 04/04/2021 0336   HGB 12.1 (L) 04/04/2021 0336   HCT 40.5 04/04/2021 0336   PLT 143 (L) 04/04/2021 0336   MCV 97.8 04/04/2021 0336   MCH 29.2 04/04/2021 0336   MCHC 29.9 (L) 04/04/2021 0336   RDW 14.3 04/04/2021 0336   LYMPHSABS 1.0 04/27/2019 0522   MONOABS 0.6 04/27/2019 0522   EOSABS 0.0 04/27/2019 0522   BASOSABS 0.0 04/27/2019 0522     BMET    Component Value Date/Time   NA 140 04/07/2021 0040   K 4.7 04/07/2021 0040   CL 90 (L) 04/07/2021 0040   CO2 45 (H) 04/07/2021 0040   GLUCOSE 94 04/07/2021 0040   BUN 10 04/07/2021 0040   CREATININE 0.77 04/07/2021 0040   CALCIUM 8.8 (L) 04/07/2021 0040   GFRNONAA >60 04/07/2021 0040   GFRAA >60 04/18/2020 1244    BNP    Component Value Date/Time   BNP 39.6 04/03/2021 1400    ProBNP    Component Value Date/Time   PROBNP 62.0 03/22/2019 1012    Imaging: DG Chest 2 View  Result Date: 04/03/2021 CLINICAL DATA:  Shortness of breath.  History of asthma. EXAM: CHEST - 2 VIEW COMPARISON:  09/29/2019. FINDINGS: New/increased consolidation in the left greater than right perihilar regions and left lung base, which is superimposed on chronic bilateral perihilar opacities. Enlarged cardiac silhouette that is partly obscured. Central pulmonary vascular dilation. No visible pleural effusions or pneumothorax. Chronic pectus deformity. Markedly dilated stomach, partially imaged. IMPRESSION: 1. New/increased left greater than right perihilar and left basilar consolidation, which is superimposed on chronic perihilar opacities. 2. Chronic cardiomegaly and dilated central pulmonary vasculature. 3. Markedly dilated stomach, partially imaged. Considered decompression. Electronically Signed   By: Margaretha Sheffield MD   On: 04/03/2021 13:43   CT Head Wo Contrast  Result Date: 04/03/2021 CLINICAL DATA:  40 year old male with headache. EXAM: CT HEAD WITHOUT CONTRAST TECHNIQUE: Contiguous axial images were obtained from the base of the skull through the vertex without intravenous contrast. COMPARISON:  CT of the paranasal sinuses dated 08/07/2011. FINDINGS: Brain: The ventricles and sulci are appropriate size for patient's age. The gray-white matter discrimination is preserved. There is no acute intracranial hemorrhage. No mass effect or midline shift no extra-axial fluid collection. Vascular:  No hyperdense vessel or unexpected calcification. Skull: Normal. Negative for fracture or focal lesion. Sinuses/Orbits: No acute finding. Other: None IMPRESSION: Unremarkable noncontrast CT of the brain. Electronically Signed   By: Anner Crete M.D.   On: 04/03/2021 21:20   CT Chest W Contrast  Result Date: 04/03/2021 CLINICAL DATA:  Headache and abnormal chest x-ray. EXAM: CT CHEST WITH CONTRAST TECHNIQUE: Multidetector CT imaging of the chest was performed during intravenous contrast administration. CONTRAST:  41mL OMNIPAQUE IOHEXOL 300 MG/ML  SOLN COMPARISON:  April 21, 2019 FINDINGS: Cardiovascular: There is stable dilatation of the main pulmonary artery (5.1 cm). The pulmonary arteries are limited in evaluation secondary to suboptimal opacification with intravenous contrast. No intraluminal filling defects are identified. There is marked severity cardiomegaly. Mass effect from an existing pectus deformity is again noted. No pericardial effusion. Mediastinum/Nodes: No enlarged mediastinal, hilar, or axillary lymph nodes. Thyroid gland, trachea, and esophagus demonstrate no significant findings. Lungs/Pleura: Stable moderate to marked severity areas of consolidation and/or atelectasis are seen within the right middle lobe, right lower lobe and throughout the left lung. Associated bronchiectasis is again seen with stable left-sided volume loss. A stable 7 mm noncalcified lung nodule is seen within the lateral aspect of the right lower lobe. There is no evidence of a pleural effusion or pneumothorax. Upper Abdomen: No acute abnormality. Musculoskeletal: A pectus deformity of the anterior chest wall is seen. IMPRESSION: 1. Stable moderate to marked severity areas of bilateral consolidation and atelectasis, which may represent sequelae associated with a chronic infectious or chronic inflammatory process. 2. Stable 7 mm noncalcified right lower lobe lung nodule. Non-contrast chest CT at 6-12 months is  recommended. If the nodule is stable at time of repeat CT, then future CT at 18-24 months (from today's scan) is considered optional for low-risk patients, but is recommended for high-risk patients. This recommendation follows the consensus statement: Guidelines for Management of Incidental Pulmonary Nodules Detected on CT Images: From the Fleischner Society 2017; Radiology 2017; 284:228-243. 3. Pectus excavatum deformity with subsequent mass effect on the heart. 4. Stable central pulmonary arterial enlargement consistent with pulmonary arterial hypertension. Electronically Signed   By: Virgina Norfolk M.D.   On: 04/03/2021 21:31     Assessment & Plan:   CAP (community acquired pneumonia) - Recently hospitalized for 4 days in July 2022 for acute on chronic respiratory failure secondary to CAP and diastolic HF. Treated with empiric antibiotics. He is clinically doing much better. He reports improvement in his breathing. He will need repeat CXR in 4 weeks.   Chronic respiratory failure with hypoxia and hypercapnia (HCC) - Continue Trilogy ventilator at night and 5L oxygen continuous - Disability paperwork filled out for patient today   Restrictive lung disease - Continue PRN albuterol hfa q 6 hours for breakthrough shortness of breath or wheezing   Sinusitis, chronic - Referring him back to ENT. He had a slight septal deviation to the left on prior imaging, possible surgical intervention. He would need pre-op assessment from pulmonary and would likely need any surgical procedure done in a hospital setting.    >40 mins spent, 50% face to face with patient    Martyn Ehrich, NP 04/13/2021

## 2021-04-13 ENCOUNTER — Encounter: Payer: Self-pay | Admitting: Primary Care

## 2021-04-13 ENCOUNTER — Ambulatory Visit: Payer: BC Managed Care – PPO

## 2021-04-13 ENCOUNTER — Ambulatory Visit (INDEPENDENT_AMBULATORY_CARE_PROVIDER_SITE_OTHER): Payer: BC Managed Care – PPO | Admitting: Primary Care

## 2021-04-13 ENCOUNTER — Other Ambulatory Visit: Payer: Self-pay

## 2021-04-13 VITALS — BP 124/64 | HR 63 | Temp 98.2°F | Ht 76.0 in | Wt 245.6 lb

## 2021-04-13 DIAGNOSIS — J984 Other disorders of lung: Secondary | ICD-10-CM

## 2021-04-13 DIAGNOSIS — J9611 Chronic respiratory failure with hypoxia: Secondary | ICD-10-CM

## 2021-04-13 DIAGNOSIS — J329 Chronic sinusitis, unspecified: Secondary | ICD-10-CM

## 2021-04-13 DIAGNOSIS — J9621 Acute and chronic respiratory failure with hypoxia: Secondary | ICD-10-CM | POA: Diagnosis not present

## 2021-04-13 DIAGNOSIS — J9601 Acute respiratory failure with hypoxia: Secondary | ICD-10-CM

## 2021-04-13 DIAGNOSIS — J189 Pneumonia, unspecified organism: Secondary | ICD-10-CM

## 2021-04-13 DIAGNOSIS — J9612 Chronic respiratory failure with hypercapnia: Secondary | ICD-10-CM

## 2021-04-13 DIAGNOSIS — J9602 Acute respiratory failure with hypercapnia: Secondary | ICD-10-CM

## 2021-04-13 NOTE — Assessment & Plan Note (Addendum)
-   Continue Trilogy ventilator at night and 5L oxygen continuous - Disability paperwork filled out for patient today

## 2021-04-13 NOTE — Patient Instructions (Addendum)
    Recommendations: Continue Trilogy at bedtime Continue 5L oxygen 24/7 Disability paperwork filled out  Referral: Pulmonary rehab (not [place]) ENT re: chronic rhinitis/sinusitis   Follow-up: September 19th with Dr. [name] (needs chest xray 30 mins prior)

## 2021-04-13 NOTE — Assessment & Plan Note (Signed)
-   Recently hospitalized for 4 days in July 2022 for acute on chronic respiratory failure secondary to CAP and diastolic HF. Treated with empiric antibiotics. He is clinically doing much better. He reports improvement in his breathing. He will need repeat CXR in 4 weeks.

## 2021-04-13 NOTE — Assessment & Plan Note (Addendum)
-   Referring him back to ENT. He had a slight septal deviation to the left on prior imaging, possible surgical intervention. He would need pre-op assessment from pulmonary and would likely need any surgical procedure done in a hospital setting.

## 2021-04-13 NOTE — Assessment & Plan Note (Signed)
-   Continue PRN albuterol hfa q 6 hours for breakthrough shortness of breath or wheezing

## 2021-04-16 ENCOUNTER — Other Ambulatory Visit (HOSPITAL_COMMUNITY): Payer: Self-pay | Admitting: Cardiology

## 2021-04-16 ENCOUNTER — Other Ambulatory Visit (HOSPITAL_COMMUNITY): Payer: Self-pay | Admitting: Internal Medicine

## 2021-04-16 DIAGNOSIS — I272 Pulmonary hypertension, unspecified: Secondary | ICD-10-CM

## 2021-04-16 DIAGNOSIS — I50812 Chronic right heart failure: Secondary | ICD-10-CM

## 2021-04-16 DIAGNOSIS — J9611 Chronic respiratory failure with hypoxia: Secondary | ICD-10-CM

## 2021-04-16 DIAGNOSIS — I509 Heart failure, unspecified: Secondary | ICD-10-CM

## 2021-05-05 ENCOUNTER — Other Ambulatory Visit (HOSPITAL_COMMUNITY): Payer: Self-pay | Admitting: Cardiology

## 2021-05-08 ENCOUNTER — Other Ambulatory Visit (HOSPITAL_COMMUNITY): Payer: Self-pay

## 2021-05-21 ENCOUNTER — Encounter (HOSPITAL_COMMUNITY): Payer: BC Managed Care – PPO | Admitting: Cardiology

## 2021-05-23 ENCOUNTER — Telehealth (HOSPITAL_COMMUNITY): Payer: Self-pay | Admitting: *Deleted

## 2021-05-23 DIAGNOSIS — J9611 Chronic respiratory failure with hypoxia: Secondary | ICD-10-CM

## 2021-05-23 NOTE — Telephone Encounter (Signed)
Received referral from Dr. [name] for this pt to participate in Pulmonary rehab with the diagnosis of Chronic Respiratory failure with Hypoxia and Hypercapnia.  Called and left message for pt to please return call.  Will advise him that presently we are on hold for scheduling  new patients for pulmonary rehab.  Once able to resume there is a significant wait list of pt referred prior and may be November more than likely December. Contact information provided. Cherre Huger, BSN Cardiac and Training and development officer

## 2021-05-24 ENCOUNTER — Telehealth (HOSPITAL_COMMUNITY): Payer: Self-pay

## 2021-05-24 ENCOUNTER — Encounter (HOSPITAL_COMMUNITY): Payer: Self-pay | Admitting: *Deleted

## 2021-05-24 NOTE — Progress Notes (Signed)
Received referral from Dr. [name] for this pt to participate in pulmonary rehab with the the diagnosis of Chronic Respiratory Failure with Hypoxia and Hypercapnia.  Pt presently participating in the program at [facility] and express that he wants something "different". Clinical review of pt follow up appt on 8/5 with Geral[facility] P[facility] Pulmonary office note.  Pt with Covid Risk Score - 4. Pt appropriate for scheduling for Pulmonary rehab when able, pt is aware that there is a waitlist.  Will forward to support staff for scheduling and verification of insurance eligibility/benefits with pt consent. Cherre Huger, BSN Cardiac and Training and development officer

## 2021-05-24 NOTE — Telephone Encounter (Signed)
Pt called back and stated that he is interested in the Pulmonary rehab program and I explained him about the backlog of the pulmonary rehab program and that the program is placed on hold as of right now pt understood. Will contact pt at a later date for scheduling,

## 2021-05-27 ENCOUNTER — Other Ambulatory Visit: Payer: Self-pay | Admitting: *Deleted

## 2021-05-27 MED ORDER — ALBUTEROL SULFATE HFA 108 (90 BASE) MCG/ACT IN AERS: 2.0000 | INHALATION_SPRAY | Freq: Four times a day (QID) | RESPIRATORY_TRACT | 6 refills | Status: AC | PRN

## 2021-05-28 ENCOUNTER — Ambulatory Visit (INDEPENDENT_AMBULATORY_CARE_PROVIDER_SITE_OTHER): Payer: BC Managed Care – PPO | Admitting: Internal Medicine

## 2021-05-28 ENCOUNTER — Ambulatory Visit (INDEPENDENT_AMBULATORY_CARE_PROVIDER_SITE_OTHER): Payer: BC Managed Care – PPO

## 2021-05-28 ENCOUNTER — Encounter: Payer: Self-pay | Admitting: Internal Medicine

## 2021-05-28 ENCOUNTER — Other Ambulatory Visit: Payer: Self-pay

## 2021-05-28 VITALS — BP 122/70 | HR 69 | Temp 98.1°F | Ht 76.0 in | Wt 251.6 lb

## 2021-05-28 DIAGNOSIS — Q676 Pectus excavatum: Secondary | ICD-10-CM

## 2021-05-28 DIAGNOSIS — J189 Pneumonia, unspecified organism: Secondary | ICD-10-CM

## 2021-05-28 DIAGNOSIS — J9611 Chronic respiratory failure with hypoxia: Secondary | ICD-10-CM

## 2021-05-28 DIAGNOSIS — Z6830 Body mass index (BMI) 30.0-30.9, adult: Secondary | ICD-10-CM

## 2021-05-28 DIAGNOSIS — E669 Obesity, unspecified: Secondary | ICD-10-CM

## 2021-05-28 DIAGNOSIS — J9612 Chronic respiratory failure with hypercapnia: Secondary | ICD-10-CM

## 2021-05-28 NOTE — Progress Notes (Signed)
OV 05/25/2019  Subjective:  Patient ID: [name], male , DOB: Feb 23, 1981 , age 40 y.o. , MRN: [id number] , ADDRESS: [place] [address]   05/25/2019 -   Chief Complaint  Patient presents with   Restrictive Lung Disease    Feels [name] little better since last visit.     ICD-10-CM   1. Chronic respiratory failure with hypoxia and hypercapnia (HCC)  J96.11    J96.12   2. Pectus excavatum  Q67.6      HPI DOMINIQ NEALY 40 y.o. -presents for follow-up of the above issues.  He was hospitalized approximately just over [name] month ago with significant acute on chronic hypercapnic and hypoxemic respiratory failure.  PCO2 was greater than 100 at that time.  He ultimately got discharged.  In between he saw my nurse practitioner and his PCO2 had come down to 59.  He is on trilogy ventilator at night along with 3 L of oxygen at night.  He is also on 1-2 L of oxygen in the daytime.  He has quit working.  He might be applying for disability him not so sure.  Is up-to-date with his vaccination status.  We have been looking at connecting him with [name] pectus excavatum redo surgeon.  [name] month ago I spoke to the surgeon Dr. Hinton Lovely at [facility].  She does not do anymore redo surgeries.  She referred [place] to Dr. Linna Hoff in [place].  Nurse practitioner has made [name] referral but neither as an outpatient have heard from him.  We also sent his CD-ROM of the CT scan to Dr. Threasa Alpha at [facility] in [place].  Waiting to hear.  Patient is interested in [name] referral to the [place] clinic because his family in the [place] area.  I have contacted Dr. West Carbo at the [facility] to inquire about potential surgeon      OV 07/30/2019  Subjective:  Patient ID: [name], male , DOB: [birth year] , age 66 y.o. , MRN: [id number] , ADDRESS: [place] [address]   07/30/2019 -   Chief Complaint  Patient presents with   Follow-up    Pt states he still has  problems with his breathing. Denies any problems with coughing. Pt is on oxygen and goes between 2-3L   Follow-up chronic hypoxemic and hypercapnic respiratory failure from severe pectus excavatum  HPI [name] [name] 40 y.o. -last seen by myself mid September 2020.  In the interim he was able to establish contact with Dr. [name] surgeon in [place].  He saw them probably this month 2020.  He says he is being scheduled for surgery early December 2020.  He says he was extremely pleased with the surgeon's visit.  Redo surgery by placing bars and opening up 6 inches of space inside his thoracic cavity has been discussed.  He says he does not want to see another institution like [place] [facility] in [place] or [place] [facility].  He is hopeful that he will have [name] good outcome after the surgery.  He says the surgery might be delayed because of the COVID-19 pandemic.  He is frustrated by this.  Overall he feels his health is stable.  He has gained some weight after stopping the Adderall.  His Lasix has been increased recently and this is helped his dyspnea.  He is on 3 L of oxygen and Trilegy ventilator at night.  He does  not feel he is hypercapnic.  He does not think he needs [name] blood gas.  But his symptom scores are worse compared to September 2020.   Venous CO2 was suggest that when the CO2 in the venous system is over 48 correlates with hypercapnia.  Currently it is 16 and still is [name] bit higher than before but it could also reflect metabolic alkalosis from Lasix.         Results for [name], [name] (MRN [id number]) as of 05/25/2019 09:48  Ref. Range 04/25/2019 07:19 04/26/2019 05:26 04/26/2019 08:05 04/27/2019 08:35 05/14/2019 14:54  pH, Arterial Latest Ref Range: 7.350 - 7.450  7.193 (LL) 7.181 (LL) 7.231 (L) 7.316 (L) 7.351  pCO2 arterial Latest Ref Range: 32.0 - 48.0 mmHg 110 (HH) 119 (HH) 103 (HH) 88.1 (HH) 59.1 (H)  pO2, Arterial Latest Ref Range: 83.0 - 108.0 mmHg 74.2 (L) 85.7 91.4  95.3 86.8   Results for [name], [name] (MRN [id number]) as of 07/30/2019 10:38  Ref. Range 06/14/2019 15:44 06/24/2019 10:51 06/28/2019 09:51 07/22/2019 11:43 07/22/2019 12:26  CO2 Latest Ref Range: 22 - 32 mmol/L 35 (H) 35 (H)   37 (H)    OV 10/14/2019  Subjective:  Patient ID: [name], male , DOB: [birth year] , age 12 y.o. , MRN: [id number] , ADDRESS: [place] [address]   10/14/2019 -  follolwup pectus and chronic hypoxemic hypercapnic respiratory failure  HPI [name] [name] 40 y.o. -is [name] follow-up visit after undergoing redo pectus repair surgery in [place].  He says he had PLA bars and increased chest volume surgery was done with [name] redo repair.  He showed me his fresh scar.  It is healing well.  He had [name] video visit with the surgeon who is somewhat optimistic according to his history that he might be weaned off of oxygen and several months.  However at this point in time 1-2  month after the surgery he feels no subjective improvement.  He still on oxygen using trilogy ventilator.  He says his ECOG is now 3.  His dyspnea is class III.  He is not able to work.  He is asking for permanent disability application.  I agree with this.  He has not had [name] Covid vaccine asked him to apply for this through the state.  He had [name] chest x-ray done at [facility] in January 2021: I only reviewed the report.  He had an access to the image on his cell phone and he showed me the image which I visualized and agree with the formal interpretation and my own interpretation as well basically there is no change he is.  He might have increased chest volume.  There is generalized congestion.  Reviewed and interpreted recent blood work October 08, 2019: Creatinine 0.7 mg percent.  Carbon dioxide 37 which appears to be his baseline  His baseline blood gas shows CO2 between 60 and 88.    His most recent BNP was 23.     ROS - per HPI     has [name] past medical history of  Asthma, CHF (congestive heart failure) ([place]), Chronic hypercapnic respiratory failure ([place]), Pectus excavatum, and Restrictive lung disease.   OV 09/26/2020  Subjective:  Patient ID: [name], male , DOB: 04-14-1981 , age 16 y.o. , MRN: [id number] , ADDRESS: [address] [place] [address] PCP [name], PA-C Patient Care Team: Amie Critchley as PCP -  General (Family Medicine) Larey Dresser, MD as Consulting Physician (Cardiology) Clance, Armando Reichert, MD as Consulting Physician (Pulmonary Disease)  This Provider for this visit: Treatment Team:  Attending Provider: Brand Males, MD    09/26/2020 -   Chief Complaint  Patient presents with   Follow-up    Still staying SOB with exertion   Follow-up chronic hypoxemic and hypercapnic respiratory failure with pectus excavatum  HPI [name] [name] 40 y.o. -presents for follow-up.  Is almost [name] year since I last saw him.  In the interim he saw [name] nurse practitioner in November 2021.  In December 2020 when he had COVID despite being vaccinated with 2 doses of mRNA vaccine.  We are presuming this was OMICRON.  He got better fast.  He uses the same oxygen at rest with exam and exertion.  He also uses his trilogy ventilator at night.  Overall he feels stable.  His symptom score is stable.  He did see cardiologist Dr. Loralie Champagne in August 2021.  He was referred to weight loss program.  He wants to be referred because since then he has gained more weight.  Apparently initially in the summer 2020 when he did not qualify.  He continues to pulmonary rehabilitation.  His last blood gas was nearly [name] year ago.  He is willing to get this repeated.  Of note his surgeon in [place] Dr. Sabra Heck has moved to [place].  He is trying to get himself established with [name] surgeon in August.   CT Chest data  No results found.   04/13/2021- Interim hx  Patient presents today for hospital follow-up. He was admitted from  04/03/21-04/07/21 for acute respiratory failure with hypoxia and hypercapnia Brian/t probable pneumonia in the setting of restrictive lung disease and chronic diastolic heart failure. He was initially treated with BIPAP and started on empiric antibiotics. Weaned to baseline 5L and transitioned back to oral antibiotics.   He is doing well today, states that his breathing is better. He has chronic chest discomfort/chest tightness. He is using Trilogy at night as directed, wearing on average 8-10 hours [name] night. Baseline O2 5L pulsed. He would like to switch rehab programs, he is current attending [facility]. He is currently on disability. Completed additional paperwork today. Denies fever, chills, confusion, cough, wheezing, N/V/Brian.    OV 05/28/2021  Subjective:  Patient ID: [name], male , DOB: 1981-03-15 , age 26 y.o. , MRN: [id number] , ADDRESS: [place] [address] PCP [name], PA-C Patient Care Team: [name], PA-C as PCP - General (Family Medicine) Larey Dresser, MD as Consulting Physician (Cardiology) Clance, Armando Reichert, MD as Consulting Physician (Pulmonary Disease)  This Provider for this visit: Treatment Team:  Attending Provider: Brand Males, MD    05/28/2021 -   Chief Complaint  Patient presents with   Follow-up    Pt states that he has been doing okay since his last hospital stay.     Follow-up chronic hypoxemic and hypercapnic respiratory failure with pectus excavatum   HPI ARBY HATT [name] 40 y.o. -admitted July 2022. Now subjectively baseline. 5L [place]. Uses BiPAP /trilogy at night. BMI is 30 . HE feels visceral obesity impacting respiration. CXR bilateral pneumonia I nJuly. Personally visualized. Asking for dietary advice. We did discuss tracheostomy as last resort if things got bad        SYMPTOM SCALE - 05/25/2019  07/30/2019  09/26/2020   O2 use  2-3L at rest and 3-4 with exertion and TRiology  qhs Same o2 and triology use. Has gained weight  Shortness of Breath 0 -> 5 scale with 5 being worst (score 6 If unable to do)    At rest 0 2 2  Simple tasks - showers, clothes change, eating, shaving 0 3 3  Household (dishes, doing bed, laundry) '3 4 4  '$ Shopping '1 4 3  '$ Walking level at own pace '2 3 3  '$ Walking up Stairs '4 5 4  '$ Total (40 - 48) Dyspnea Score '10 21 20  '$ How bad is your cough? 0 n/[name] 0  How bad is your fatigue 0 3 2  nausea   0  vomit   0  diarrhea   0  anxiety   2  depression   2     Results for HIEP, RIPLEY (MRN [id number]) as of 09/26/2020 10:16  Ref. Range 04/26/2019 08:05 04/27/2019 08:35 05/14/2019 14:54 08/03/2019 12:55 10/25/2019 14:35  pH, Arterial Latest Ref Range: 7.350 - 7.450  7.231 (L) 7.316 (L) 7.351 7.289 (L) 7.328 (L)  pCO2 arterial Latest Ref Range: 32.0 - 48.0 mmHg 103 (HH) 88.1 (HH) 59.1 (H) 85.0 (HH) 81.2 (HH)  pO2, Arterial Latest Ref Range: 83.0 - 108.0 mmHg 91.4 95.3 86.8 116 (H) 76.3 (L)    PFT  PFT Results Latest Ref Rng & Units 08/21/2017  FVC-Pre L 1.02  FVC-Predicted Pre % 16  FVC-Post L 1.05  FVC-Predicted Post % 16  Pre FEV1/FVC % % 72  Post FEV1/FCV % % 71  FEV1-Pre L 0.73  FEV1-Predicted Pre % 14  FEV1-Post L 0.74  DLCO uncorrected ml/min/mmHg 21.18  DLCO UNC% % 54  DLCO corrected ml/min/mmHg 21.75  DLCO COR %Predicted % 55  DLVA Predicted % 163  TLC L 2.81  TLC % Predicted % 35  RV % Predicted % 83       has [name] past medical history of Asthma, CHF (congestive heart failure) (HCC), Chronic hypercapnic respiratory failure (HCC), Pectus excavatum, and Restrictive lung disease.   reports that he quit smoking about 14 years ago. His smoking use included cigarettes. He has [name] 0.10 pack-year smoking history. He has never used smokeless tobacco.  Past Surgical History:  Procedure Laterality Date   KNEE SURGERY     right   PECTUS EXCAVATUM REPAIR     RIGHT HEART CATH N/[name] 04/21/2019   Procedure: RIGHT HEART CATH;  Surgeon:  Larey Dresser, MD;  Location: [facility];  Service: Cardiovascular;  Laterality: N/[name];    Allergies  Allergen Reactions   Other Itching and Other (See Comments)    Pet Dander    Immunization History  Administered Date(s) Administered   Influenza Split 06/16/2012, 06/27/2012, 06/08/2013, 06/09/2014, 06/28/2014, 08/10/2015, 05/28/2018, 05/11/2019   Influenza Whole 06/09/2010, 06/10/2011   Influenza,inj,Quad PF,6+ Mos 06/08/2013, 08/10/2015, 05/28/2018, 05/11/2019   Influenza,inj,Quad PF,6-35 Mos 05/29/2020   Influenza,inj,quad, With Preservative 06/08/2013, 08/10/2015   Influenza-Unspecified 06/09/2014, 05/22/2020   Moderna Sars-Covid-2 Vaccination 11/26/2019, 12/27/2019   Tdap 09/10/2009    Family History  Problem Relation Age of Onset   Hypertension Mother    Heart attack Neg Hx    Stroke Neg Hx      Current Outpatient Medications:    acetaminophen (TYLENOL) 500 MG tablet, Take 1,000 mg by mouth every 6 (six) hours as needed for mild pain., Disp: , Rfl:    albuterol (PROAIR HFA) 108 (90 Base) MCG/ACT inhaler, Inhale 2 puffs into the lungs  every 6 (six) hours as needed for wheezing or shortness of breath., Disp: 8.5 each, Rfl: 6   bisoprolol (ZEBETA) 5 MG tablet, TAKE 1 TABLET BY MOUTH EVERY DAY, Disp: 90 tablet, Rfl: 3   busPIRone (BUSPAR) 15 MG tablet, Take 15 mg by mouth 3 (three) times daily., Disp: , Rfl:    cetirizine (ZYRTEC) 10 MG tablet, Take 1 tablet (10 mg total) by mouth daily., Disp: 30 tablet, Rfl: 2   dapagliflozin propanediol (FARXIGA) 10 MG TABS tablet, Take 1 tablet (10 mg total) by mouth daily before breakfast., Disp: 90 tablet, Rfl: 3   FLUoxetine (PROZAC) 20 MG capsule, Take 20 mg by mouth daily., Disp: , Rfl:    furosemide (LASIX) 20 MG tablet, TAKE 2 TABLETS EVERY MORNING AND 1 TABLET EVERY EVENING NEEDS DOCTOR APPOINTMENT, Disp: 270 tablet, Rfl: 1   isosorbide mononitrate (IMDUR) 30 MG 24 hr tablet, TAKE 1 TABLET (30 MG TOTAL) BY MOUTH DAILY.  NEED APPT FOR FUTURE REFILLS [phone], Disp: 90 tablet, Rfl: 3   Melatonin 10 MG TABS, Take 10 mg by mouth daily as needed (sleep)., Disp: , Rfl:    mirtazapine (REMERON) 15 MG tablet, Take 15 mg by mouth at bedtime., Disp: , Rfl:    omeprazole (PRILOSEC OTC) 20 MG tablet, Take 20 mg by mouth daily., Disp: , Rfl:    Potassium Chloride ER 20 MEQ TBCR, TAKE 2 TABLETS BY MOUTH EVERY DAY, Disp: 180 tablet, Rfl: 3   spironolactone (ALDACTONE) 25 MG tablet, TAKE 1 TABLET BY MOUTH EVERY DAY, Disp: 90 tablet, Rfl: 1      Objective:   Vitals:   05/28/21 1330  BP: 122/70  Pulse: 69  Temp: 98.1 Brian (36.7 C)  TempSrc: Oral  SpO2: 93%  Weight: 251 lb 9.6 oz (114.1 kg)  Height: '6\' 4"'$  (1.93 m)    Estimated body mass index is 30.63 kg/m as calculated from the following:   Height as of this encounter: '6\' 4"'$  (1.93 m).   Weight as of this encounter: 251 lb 9.6 oz (114.1 kg).  '@WEIGHTCHANGE'$ @  [employer]   05/28/21 1330  Weight: 251 lb 9.6 oz (114.1 kg)     Physical Exam  General: No distress. Looks well Neuro: Alert and Oriented x 3. GCS 15. Speech normal Psych: Pleasant Resp:  Barrel Chest - no.  Wheeze - no, Crackles - no, No overt respiratory distress CHEST - PECTUS CVS: Normal heart sounds. Murmurs - no Ext: Stigmata of Connective Tissue Disease - no HEENT: Normal upper airway. PEERL +. No post nasal drip        Assessment:       ICD-10-CM   1. Chronic respiratory failure with hypoxia and hypercapnia (HCC)  J96.11    J96.12     2. Community acquired pneumonia, unspecified laterality  J18.9     3. Pectus excavatum  Q67.6     4. Class 1 obesity with serious comorbidity and body mass index (BMI) of 30.0 to 30.9 in adult, unspecified obesity type  E66.9    Z68.30          Plan:     Patient Instructions     ICD-10-CM   1. Chronic respiratory failure with hypoxia and hypercapnia (HCC)  J96.11    J96.12     2. Community acquired pneumonia, unspecified  laterality  J18.9     3. Pectus excavatum  Q67.6     4. Class 1 obesity with serious comorbidity and body mass index (BMI) of  30.0 to 30.9 in adult, unspecified obesity type  E66.9    Z68.30       Do cxr today 05/28/2021 to document invprovement since July 2022 pneumonia Goal weight is 210 pounds  Followup  - will cal with xray results  - time abg based on xray - return in 3 months or sooner if needd - 15 min slot   Xxxxx  #WEight Management    - we discussed extensively about weight management   - follow low glycemic diet plan that I outlined for you after extensive discussion. Do not follow other plans - just eat foods in leff lane - download app called myfitness pal and set for 1-2 pound weight loss [name] month - run [name] slight calorie deficit plus low carb  - General  - drink lot of water  - avoid all moderate and high glycemic foods especially bad fruits, breads, pastas, fried foods, battered foods, sugary foods (these are the food in centerl and right lane that you have to avoid)  - make non-starchy vegetables your base in terms of volume you eat; 50% of what you see on the plate and goes into your mouth should be these vegetables (the vegetables in the left lane)  - always make sure you balance good carbs, good protein and good fat source  - good carbs are non-starchy vegetables, uncanned beans in the left column and low glycemic fruits in the left colum  - good protein source is egg white, beans, tofu, fish, chicken breast, fish, [place] and bison. Remember meat has to be skinless  - good healthy fat source is nuts, and fish   - focusing on eating right healthy foods (left lane) and avoiding unhealthy foods (middle and right lane) is better way to lose weight than to go hypo-caloric  - focus on staying full by eating right  - having [name] daily and weekly plan for what you will eat and where you will eat depending on your work, social life schedule is very important   - watch out for  misleading labels on grocery aisle: High Fiber and Low Fat labeled foods generally are high in bad carbs or sugar  - measure weight once  [name] week  - discipline and attitude is key. Do not care for anyone else's opinion or feelings. Only yours matters   - For breakfast  - most important meal of the day. So, eat daily breakfast. Do not skip   - recommend 1/2 to 1 cup steel cut oat meal or 1/2 to 1 cup fiber one 60 cal   Or  1 to 1.5 cups Kashi go-lean with non-fat plain milk or 60 Cal Silk Soy mild. Can add Berries. Can have egg at same time for breakfast  - For snacks  - recommend total 2-3 snacks per day  - snack should be light and filling  - best times are between breakfast and lunch, lunch and dinner and sometimes post-dinner snack  - Nut are great snacks. Stick to low glycemic nuts (less than 50gm per day) and eat only the nuts in the left lane like peanuts, pista, almond, walnut  - Low glycemic fruits are great snacks. Have 1-2 servings each day of fruits from the left lane   - If you like yogurt or cottage cheese - recommend Oikos or Fage 0% greek yogourt or Plan non-fat yogurt or Breakstone non-fat cottage cheese. Theyse have the least sugar. Do no exceed 100-200 gram per day. Fruit yogurts are  the worst  - Good Protein Shakes are good snacks:  EAS Abbot Whey Powder shake, EAS Myoplex lite, EAS Carb Control. Muscle Milk Shake  - For Lunch and dinner  - unlimited non-starchy vegetable (prefer raw fresh or roasted or grilled) with skinless chicken or fish  - Special Notes  - Nuts: Nut are great snacks and have heart benefits. Stick to low glycemic nuts (less than 50gm per day) and eat only the nuts in the left lane like peanuts, pista, almond, walnuts  -  Ok to eat above nuts daily but only < 50gm/day  - If you eat more than 50gm/day then you run risk of eating too many calories or saturated fat  - AVoid nuts glazed with sugar. Nuts have to be in salted/original form or roasted   -  Fruits: Eat 1-2 fresh fruit servings daily but fruits can be dangerous because of high sugar content. So, choose your fruits wisely. Eat only the low glycemic fruits (left lane). Eat them fresh.  Do not eat them canned  - Avoid all fresh juices except if you use the low glycemic fruits and make them yourself without adding extra sugar   - Dairy: Is optional. Eat zero fat or low fat, fruit free yogurts or cottage cheese but not more than 100-200g per day  - Restaurant  - all restaurants have bad and good choices. Even fast food restaurants offer you good choices  - at restaurants do no fall prey to social pressure.. One way to eat healthy at restaurant is to eat healthy snack or light healthy meal before you go to restaurant so that will prevent your cravings  - Restaurants with worst choices: [place] (except Chipotle, or [place]), [place], [place]. At these restaurants avoid the bread, curry, fried and battered foods and chips  - Restaurants with best choices: greek, mid-east, [place], [place] (again here avoid bread, deep fried stuffed and pasta)  - Restaurants with Ok choice: McDonald's, [employer], [employer] (again here avoid the bread, fried stuff, fried meat)  - Always ask for grilled meat or vegetables, and fresh salad choices (get your salad dressing as low fat and to the side)  - For Will Power  - Prepare, prepare, prepare: Plan your day and think of what you will eat and when you will eat and where you will eat.  - Snack good stuff to keep yourself full to avoid hunger and losing will power  - 1 minute fast walk when feeling cravings could  Help  -  Eat breakfast  - Berries are excellent to control sugar cravings  - Proteins can prevent cravings  - Fats like nuts keep you full and prevent cravings  - Avoid hunger; stay ahead by eating small amounts of the right food  - Avoid hunger; stay ahead by eating frequently  - Drink lot of water  - Eat slower       SIGNATURE    Dr.  Brand Males, M.Brian., Brian.C.C.P,  Pulmonary and Critical Care Medicine Staff Physician, [place] Director - Interstitial Lung Disease  Program  Pulmonary [place] at [place], [place], [address]  Pager: [phone], If no answer or between  15:00h - 7:00h: call [phone] Telephone: [phone]  1:52 PM 05/28/2021

## 2021-05-28 NOTE — Patient Instructions (Signed)
ICD-10-CM   1. Chronic respiratory failure with hypoxia and hypercapnia (HCC)  J96.11    J96.12     2. Community acquired pneumonia, unspecified laterality  J18.9     3. Pectus excavatum  Q67.6     4. Class 1 obesity with serious comorbidity and body mass index (BMI) of 30.0 to 30.9 in adult, unspecified obesity type  E66.9    Z68.30       Do cxr today 05/28/2021 to document invprovement since July 2022 pneumonia Goal weight is 210 pounds  Followup  - will cal with xray results  - time abg based on xray - return in 3 months or sooner if needd - 15 min slot   Xxxxx  #WEight Management    - we discussed extensively about weight management   - follow low glycemic diet plan that I outlined for you after extensive discussion. Do not follow other plans - just eat foods in leff lane - download app called myfitness pal and set for 1-2 pound weight loss a month - run a slight calorie deficit plus low carb  - General  - drink lot of water  - avoid all moderate and high glycemic foods especially bad fruits, breads, pastas, fried foods, battered foods, sugary foods (these are the food in centerl and right lane that you have to avoid)  - make non-starchy vegetables your base in terms of volume you eat; 50% of what you see on the plate and goes into your mouth should be these vegetables (the vegetables in the left lane)  - always make sure you balance good carbs, good protein and good fat source  - good carbs are non-starchy vegetables, uncanned beans in the left column and low glycemic fruits in the left colum  - good protein source is egg white, beans, tofu, fish, chicken breast, fish, [place] and bison. Remember meat has to be skinless  - good healthy fat source is nuts, and fish   - focusing on eating right healthy foods (left lane) and avoiding unhealthy foods (middle and right lane) is better way to lose weight than to go hypo-caloric  - focus on staying full by eating right  - having  a daily and weekly plan for what you will eat and where you will eat depending on your work, social life schedule is very important   - watch out for misleading labels on grocery aisle: High Fiber and Low Fat labeled foods generally are high in bad carbs or sugar  - measure weight once  a week  - discipline and attitude is key. Do not care for anyone else's opinion or feelings. Only yours matters   - For breakfast  - most important meal of the day. So, eat daily breakfast. Do not skip   - recommend 1/2 to 1 cup steel cut oat meal or 1/2 to 1 cup fiber one 60 cal   Or  1 to 1.5 cups Kashi go-lean with non-fat plain milk or 60 Cal Silk Soy mild. Can add Berries. Can have egg at same time for breakfast  - For snacks  - recommend total 2-3 snacks per day  - snack should be light and filling  - best times are between breakfast and lunch, lunch and dinner and sometimes post-dinner snack  - Nut are great snacks. Stick to low glycemic nuts (less than 50gm per day) and eat only the nuts in the left lane like peanuts, pista, almond, walnut  - Low glycemic fruits  are great snacks. Have 1-2 servings each day of fruits from the left lane   - If you like yogurt or cottage cheese - recommend Oikos or Fage 0% greek yogourt or Plan non-fat yogurt or Breakstone non-fat cottage cheese. Theyse have the least sugar. Do no exceed 100-200 gram per day. Fruit yogurts are the worst  - Good Protein Shakes are good snacks:  EAS Abbot Whey Powder shake, EAS Myoplex lite, EAS Carb Control. Muscle Milk Shake  - For Lunch and dinner  - unlimited non-starchy vegetable (prefer raw fresh or roasted or grilled) with skinless chicken or fish  - Special Notes  - Nuts: Nut are great snacks and have heart benefits. Stick to low glycemic nuts (less than 50gm per day) and eat only the nuts in the left lane like peanuts, pista, almond, walnuts  -  Ok to eat above nuts daily but only < 50gm/day  - If you eat more than 50gm/day then  you run risk of eating too many calories or saturated fat  - AVoid nuts glazed with sugar. Nuts have to be in salted/original form or roasted   - Fruits: Eat 1-2 fresh fruit servings daily but fruits can be dangerous because of high sugar content. So, choose your fruits wisely. Eat only the low glycemic fruits (left lane). Eat them fresh.  Do not eat them canned  - Avoid all fresh juices except if you use the low glycemic fruits and make them yourself without adding extra sugar   - Dairy: Is optional. Eat zero fat or low fat, fruit free yogurts or cottage cheese but not more than 100-200g per day  - Restaurant  - all restaurants have bad and good choices. Even fast food restaurants offer you good choices  - at restaurants do no fall prey to social pressure.. One way to eat healthy at restaurant is to eat healthy snack or light healthy meal before you go to restaurant so that will prevent your cravings  - Restaurants with worst choices: [place] (except Chipot[employer], or [employer]), [place], [place]. At these restaurants avoid the bread, curry, fried and battered foods and chips  - Restaurants with best choices: greek, mid-east, [place], [place] (again here avoid bread, deep fried stuffed and pasta)  - Restaurants with Ok choice: McDonald's, [employer], [employer] (again here avoid the bread, fried stuff, fried meat)  - Always ask for grilled meat or vegetables, and fresh salad choices (get your salad dressing as low fat and to the side)  - For Will Power  - Prepare, prepare, prepare: Plan your day and think of what you will eat and when you will eat and where you will eat.  - Snack good stuff to keep yourself full to avoid hunger and losing will power  - 1 minute fast walk when feeling cravings could  Help  -  Eat breakfast  - Berries are excellent to control sugar cravings  - Proteins can prevent cravings  - Fats like nuts keep you full and prevent cravings  - Avoid hunger; stay ahead by eating small  amounts of the right food  - Avoid hunger; stay ahead by eating frequently  - Drink lot of water  - Eat slower

## 2021-05-29 NOTE — Progress Notes (Signed)
You saw patient yesterday

## 2021-06-06 ENCOUNTER — Telehealth: Payer: Self-pay | Admitting: Internal Medicine

## 2021-06-06 DIAGNOSIS — J9612 Chronic respiratory failure with hypercapnia: Secondary | ICD-10-CM

## 2021-06-06 DIAGNOSIS — J9611 Chronic respiratory failure with hypoxia: Secondary | ICD-10-CM

## 2021-06-06 NOTE — Telephone Encounter (Signed)
ATC, left VM. 

## 2021-06-07 NOTE — Telephone Encounter (Signed)
Called and spoke with Carmell Austria from [facility] who is calling because she would like to know if patient needs to be in the pulmonary rehab program. She states that the patient was previously in their maintance program (open gym) but they have not seen him in quiet awhile. She states that he just randomly walked in to their clinic yesterday and said he wanted to come back and that he was hospitalized in July. Looks like there has been 2 orders placed for Pulmonary rehab already. Called and spoke with patient to get more information on exactly what he is looking for and if he is wanting to look into rehab in [place] or [place]. Patient states that he was looking into the one in [place] but they told him that he would be put on a wait list and they probably wouldn't be able to get him in till November. He states that he was looking to see if the [facility] location had anything different to offer than [place]. Patient states that he would like to go back to the [place] one since the [place] one is the same and its closer for him.   Dr. [name] please advise

## 2021-06-08 NOTE — Telephone Encounter (Signed)
Called [name] but she did not answer. Left message for her to call us back.

## 2021-06-08 NOTE — Telephone Encounter (Signed)
Yes should go to pulm rehab. [place] good place to go to

## 2021-06-11 NOTE — Telephone Encounter (Signed)
I have the order for [facility].  I called and verified they are doing rehab now.  I will fill out referral form and put it in MR's paperwork for him to complete when he returns to the office.  Will route message to triage so they can make pt aware thru [place].

## 2021-06-11 NOTE — Telephone Encounter (Signed)
Called and spoke with [name]. I explained that the patient has been trying to get into pulmonary rehab since July 2022 after his recent hospitalization. She stated that they will need an order since they currently do not have one. Advised her that I would go ahead and place the order.   Order has been placed. Nothing further needed at time of call.

## 2021-06-11 NOTE — Telephone Encounter (Signed)
Return call from [place]  [phone].Hillery Hunter

## 2021-06-11 NOTE — Telephone Encounter (Signed)
PCC's   can you guys sent the order for pulmonary rehab to [facility]?    This is from the pt:  I need the rehab order to be sent to [facility]. They won't let me start rehabbing until they get the order from you guys. They should be reaching out to you today also but I wanted to message in case they got busy.    Thank you!

## 2021-06-12 ENCOUNTER — Other Ambulatory Visit (HOSPITAL_COMMUNITY): Payer: Self-pay | Admitting: Cardiology

## 2021-06-13 ENCOUNTER — Telehealth: Payer: Self-pay | Admitting: Internal Medicine

## 2021-06-13 NOTE — Telephone Encounter (Signed)
Will need to call Carmell Austria back tomorrow since it is after 5pm. Order was placed yesterday. Vallarie Mare has the form and will have MR to sign once he is back in the office on Friday.

## 2021-06-14 NOTE — Telephone Encounter (Signed)
Spoke with Carmell Austria letting her know that we did receive a form that will give to MR Friday, 10/7 when he returns to the office. Stated to her as soon as the form is filled out that we would get it sent back to her and she verbalized understanding.  Will keep encounter open until we know that the form has been taken care of.

## 2021-06-14 NOTE — Telephone Encounter (Signed)
Called [place] but she did not answer. Left message for her to call back.

## 2021-06-15 NOTE — Telephone Encounter (Signed)
Referral was signed by MR and given back to [facility]. Checked with Judeen Hammans to see if she saw this on her desk and she said she did and has faxed it back for pt.nothing further needed.

## 2021-06-19 NOTE — Telephone Encounter (Signed)
Called and spoke with pt in regards to [facility], pt stated he was able to into [facility].   Closed referral

## 2021-06-19 NOTE — Telephone Encounter (Signed)
Pt insurance is active and benefits verified through [place]. Co-pay $0.00, DED $1,250.00/$1,250.00 met, out of pocket $4,890.00/$4,890.00 met, co-insurance 20%. No pre-authorization required. Mary/[employer], 06/14/21, GRJ#[id number]   Will contact patient to see if he is interested in the Pulmonary Rehab Program.

## 2021-06-29 ENCOUNTER — Ambulatory Visit (HOSPITAL_COMMUNITY)
Admission: RE | Admit: 2021-06-29 | Discharge: 2021-06-29 | Disposition: A | Discharge status: Home / Self Care | Payer: BC Managed Care – PPO | Source: Ambulatory Visit | Attending: Cardiology | Admitting: Cardiology

## 2021-06-29 ENCOUNTER — Encounter (HOSPITAL_COMMUNITY): Payer: Self-pay | Admitting: Cardiology

## 2021-06-29 ENCOUNTER — Other Ambulatory Visit: Payer: Self-pay

## 2021-06-29 VITALS — BP 130/60 | HR 71 | Wt 251.6 lb

## 2021-06-29 DIAGNOSIS — K7460 Unspecified cirrhosis of liver: Secondary | ICD-10-CM | POA: Diagnosis not present

## 2021-06-29 DIAGNOSIS — Z8249 Family history of ischemic heart disease and other diseases of the circulatory system: Secondary | ICD-10-CM | POA: Diagnosis not present

## 2021-06-29 DIAGNOSIS — I5032 Chronic diastolic (congestive) heart failure: Secondary | ICD-10-CM | POA: Insufficient documentation

## 2021-06-29 DIAGNOSIS — Z8616 Personal history of COVID-19: Secondary | ICD-10-CM | POA: Insufficient documentation

## 2021-06-29 DIAGNOSIS — R55 Syncope and collapse: Secondary | ICD-10-CM | POA: Diagnosis not present

## 2021-06-29 DIAGNOSIS — J9612 Chronic respiratory failure with hypercapnia: Secondary | ICD-10-CM | POA: Diagnosis not present

## 2021-06-29 DIAGNOSIS — J45909 Unspecified asthma, uncomplicated: Secondary | ICD-10-CM | POA: Insufficient documentation

## 2021-06-29 DIAGNOSIS — Z7984 Long term (current) use of oral hypoglycemic drugs: Secondary | ICD-10-CM | POA: Diagnosis not present

## 2021-06-29 DIAGNOSIS — J9611 Chronic respiratory failure with hypoxia: Secondary | ICD-10-CM | POA: Insufficient documentation

## 2021-06-29 DIAGNOSIS — I272 Pulmonary hypertension, unspecified: Secondary | ICD-10-CM | POA: Insufficient documentation

## 2021-06-29 LAB — BASIC METABOLIC PANEL
Anion gap: 7 (ref 5–15)
BUN: 13 mg/dL (ref 6–20)
CO2: 42 mmol/L — ABNORMAL HIGH (ref 22–32)
Calcium: 8.8 mg/dL — ABNORMAL LOW (ref 8.9–10.3)
Chloride: 91 mmol/L — ABNORMAL LOW (ref 98–111)
Creatinine, Ser: 1.06 mg/dL (ref 0.61–1.24)
GFR, Estimated: >60 mL/min (ref >60)
Glucose, Bld: 110 mg/dL — ABNORMAL HIGH (ref 70–99)
Potassium: 4.2 mmol/L (ref 3.5–5.1)
Sodium: 140 mmol/L (ref 135–145)

## 2021-06-29 LAB — BRAIN NATRIURETIC PEPTIDE: B Natriuretic Peptide: 12 pg/mL (ref 0.0–100.0)

## 2021-06-29 NOTE — Patient Instructions (Signed)
You have been referred to there pharmacy clinic for weight loss. They will call you to schedule an appointment.   Labs today We will only contact you if something comes back abnormal or we need to make some changes. Otherwise no news is good news!  Your physician recommends that you schedule a follow-up appointment in: 4 months with Dr Aundra Dubin  Please call office at [phone] option 2 if you have any questions or concerns.    At the [facility], you and your health needs are our priority. As part of our continuing mission to provide you with exceptional heart care, we have created designated Provider Care Teams. These Care Teams include your primary Cardiologist (physician) and Advanced Practice Providers (APPs- Physician Assistants and Nurse Practitioners) who all work together to provide you with the care you need, when you need it.   You may see any of the following providers on your designated Care Team at your next follow up: Dr Glori Bickers Dr Haynes Kerns, NP [name], [place] [facility] [place], [place] Audry Riles, PharmD   Please be sure to bring in all your medications bottles to every appointment.

## 2021-06-30 NOTE — Progress Notes (Signed)
Patient ID: [name], male   DOB: May 22, 1981, 40 y.o.   MRN: [id number] PCP: Dr. Tollie Pizza Cardiology: Dr. Aundra Dubin  40 y.o. with history of pectus excavatum s/p repair in childhood and redo pectus repair in 6/11 returns to cardiology clinic for followup of RV failure. He has chronic hypercarbic and and nocturnal hypoxemic respiratory failure.  He has been wearing Bipap at night.  Oxygen saturation drops with heavy exercise, so he uses oxygen when he will be exercising.  Echo 5/16 showed normal LV EF but did not show the RV well.  I had him get a cardiac MRI in 6/16 for better view of the RV.  This showed moderate to severe RV dilation with mild to moderate RV systolic dysfunction, RV EF 37%.  This looked very similar to the 2010 MRI.   In 8/20, he was admitted to the hospital in [place] with acute hypercarbic respiratory failure.  He was intubated, eventually able to extubate. He was extensively diuresed.  Cardiac MRI showed markedly dilated RV with moderately decreased systolic function, D-shaped septum, and left-ward shift of the heart.  The left atrium was compressed between the ascending and descending aorta.  RHC showed elevated right and left heart filling pressures.  Echo bubble study was negative. Aspiration PNA was also a concern at this admission.    He had redo pectus surgery 08/10/19 in [place] with Dr. [name].    Echo 4/21 showed EF 55%, compressed LA, normal RV size with mildly decreased systolic function, D-shaped septum (mildly), CVP estimate 8, unable to estimate PASP.  Echo in 6/22 showed EF 50-55%, mildly dilated RV with normal systolic function, unable to estimate PASP.  Abdominal US in 6/22 showed normal-appearing liver, no ascites.   Symptomatically, he seems about the same as before the surgery, he has not seen much improvement.  He had repeat CT chest in [place] in 6/21, was told that he needs to lose weight.  He continues with pulmonary rehab in [place]. He was  admitted in 7/22 with PNA and hypoxemia, improved after antibiotics.  He is chronically short of breath after walking about 100-200 feet.  Using oxygen at all times (4L [place]), Bipap at night. Weight is up about 4 lbs.  No orthopnea/PND.    Labs (8/10): BNP 9, K 3.6, creatinine 0.7  Labs (2/14): K 3.8, creatinine 0.8, BNP not elevated Labs (2/16): K 3.4, creatinine 0.81 Labs (6/16): K 4, creatinine 0.73, BNP 12 Labs (11/18): K 4.1, creatinine 0.82, pro-BNP 6, hgb 15.2 Labs (7/20): pro-BNP 62, K 5, creatinine 0.85  Labs (8/20): K 4.6, creatinine 0.69 Labs (9/20): ABG 7.35/59/87  Labs (11/20): K 3.4, creatinine 0.75 Labs (1/21): BNP 23, K 4.2, creatinine 0.7 Labs (2/21): ABG 7.33/81/76 Labs (7/21): K 4.1, creatinine 0.89 Labs (8/21): K 4.2, creatinine 0.69, BNP 37 Labs (8/22): K 4.2, creatinine 0.8, hgb 12.9   Allergies (verified):  No Known Drug Allergies   Past Medical History:  1. Pectus excavatum status post repair in [place] in childhood and repeat repair at [facility] in 6/11. Borderline Marfanoid habitus.  - Pectus redo repair in [place] by Dr. [name] in 12/20.  2. Asthma.  3. CHF: Patient was admitted in 6/10 with CHF and volume overload. He was diuresed with IV lasix and felt better. Echo suggested that the RV was enlarged. RHC was done (after several doses of IV lasix) showing mean RA 13, RV 38/12, PA 42/23 mean 34, mean PCWP 15. PVR < 2 WU. Shunt run  showed no O2 step up. Patient was hypoxic and was started O2. Cardiac MRI (6/10) showed normal LV size and systolic function EF 86%, D-shaped IV septum, moderate to severe RV dilation with RV EF 37%. The pulmonic valve opened normally. The heart was shifted leftward (pectus) with compression of the LA between the ascending and descending aorta. MRA of the chest showed normal pulmonary veins. CHF was primarily right-sided with mild pulmonary HTN noted. He is hypoxic, especially at night, so hypoxic pulmonary vasoconstriction could be playing  a role. PCWP was only mildly elevated but I wonder if LA compression could be playing a role. Repeat echo after pectus surgery in 11/11 showed normal LV systolic function and size with a D-shaped interventricular septum. The LA was still compressed between ascending and descending aorta. The RV was moderately dilated with mild systolic dysfunction.  Echo (5/16) with EF 5-60%, aortic root 4.0 cm, RV poorly vsiualized.  Cardiac MRI (6/16) with pectus excavatum, left-shifted heart, LA compressed between ascending and descending aorta, LV EF 58% with D-shaped interventricular septum suggestive of RV pressure/volume overload, moderate to severe RV dilation with mild to moderate RV systolic dysfunction (RV EF 37%), dilated PA (this MRI is very similar to prior in 2010).  - RHC (8/20): mean RA 11, PA 51/30 mean 41, mean PCWP 18, PAPI 1.9, CVP/PCWP 0.61 - Limited echo for bubble study (8/20): Negative bubble study.  - Cardiac MRI (8/20): The left lung appears collapsed, compressed by leftwards shift of the heart and dilated RV.  Dilated main pulmonary artery. Normal LV size with mild diffuse hypokinesis, EF 52%. Markedly dilated RV with EF 31%, moderate hypokinesis. D-shaped interventricular septum, suggestive of RV pressure/volume overload. Small left atrium appears compressed between the aortic root/ascending aorta and the descending aorta/ventrebral column. Nonspecific LGE pattern at the RV insertion sites, suggestive of RV pressure/volume overload. - Echo (4/21): EF 55%, compressed LA, normal RV size with mildly decreased systolic function, D-shaped septum (mildly), CVP estimate 8, unable to estimate PASP.  - Echo (6/22): EF 50-55%, mildly dilated RV with normal systolic function, unable to estimate PASP.  4. 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  - High resolution CT chest (8/19): Bilateral pleuroparenchymal scarring, cirrhosis noted. No ILD.  - CT chest (8/20):  Severe pectus deformity, heart shifted left with extensive left lung compressive atelectasis.  5. 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 Patient is on Bipap.  6. ?Cirrhosis: Noted on RUQ [place] in 8/20.  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  - MRI liver in 10/20 showed hepatic hemangioma and lobulated liver contours concerning for cardiogenic hepatic congestion, no frank cirrhosis. - Abdominal US (6/22): No ascites, liver appeared normal.  7. COVID-19 in 1/22  Family History:  HTN-Mother   Social History:  The patient lives in [place] with his wife and son.  Patient currently out of work (prior in [employer]) => working towards disability.  He drinks 1-6 drinks per week, never having more than 3 drinks in 1 evening. No illicit drug use.  Patient states former smoker. Started at age 48. only smoked socially. Quit smoking at age 6.   Review of Systems  All systems reviewed and negative except as per HPI.  Current Outpatient Medications  Medication Sig Dispense Refill   acetaminophen (TYLENOL) 500 MG tablet Take 1,000 mg by mouth every 6 (six) hours as needed for mild pain.     albuterol (PROAIR HFA) 108 (90 Base) MCG/ACT inhaler Inhale 2 puffs  into the lungs every 6 (six) hours as needed for wheezing or shortness of breath. 8.5 each 6   bisoprolol (ZEBETA) 5 MG tablet TAKE 1 TABLET BY MOUTH EVERY DAY 90 tablet 3   busPIRone (BUSPAR) 15 MG tablet Take 15 mg by mouth 3 (three) times daily.     cetirizine (ZYRTEC) 10 MG tablet Take 1 tablet (10 mg total) by mouth daily. 30 tablet 2   dapagliflozin propanediol (FARXIGA) 10 MG TABS tablet Take 1 tablet (10 mg total) by mouth daily before breakfast. 90 tablet 3   FLUoxetine (PROZAC) 20 MG capsule Take 20 mg by mouth daily.     furosemide (LASIX) 20 MG tablet TAKE 2 TABLETS EVERY MORNING AND 1 TABLET EVERY EVENING NEEDS DOCTOR APPOINTMENT 270 tablet 1   isosorbide mononitrate (IMDUR) 30 MG 24 hr tablet TAKE 1 TABLET (30 MG TOTAL)  BY MOUTH DAILY. NEED APPT FOR FUTURE REFILLS [phone] 90 tablet 3   Melatonin 10 MG TABS Take 10 mg by mouth daily as needed (sleep).     mirtazapine (REMERON) 15 MG tablet Take 15 mg by mouth at bedtime.     omeprazole (PRILOSEC OTC) 20 MG tablet Take 20 mg by mouth daily.     Potassium Chloride ER 20 MEQ TBCR TAKE 2 TABLETS BY MOUTH EVERY DAY 180 tablet 3   spironolactone (ALDACTONE) 25 MG tablet TAKE 1 TABLET BY MOUTH EVERY DAY 90 tablet 1   No current facility-administered medications for this encounter.    BP 130/60   Pulse 71   Wt 114.1 kg (251 lb 9.6 oz)   SpO2 96% Comment: 4 liters N/C  BMI 30.63 kg/m  General: NAD Neck: No JVD, no thyromegaly or thyroid nodule.  Lungs: Clear to auscultation bilaterally with normal respiratory effort. CV: Nondisplaced PMI.  Heart regular S1/S2, no S3/S4, no murmur.  No peripheral edema.  No carotid bruit.  Normal pedal pulses.  Abdomen: Soft, nontender, no hepatosplenomegaly, no distention.  Skin: Intact without lesions or rashes.  Neurologic: Alert and oriented x 3.  Psych: Normal affect. Extremities: No clubbing or cyanosis.  HEENT: Normal.   Assessment/Plan: 1. Chronic hypercarbic/hypoxemic respiratory failure:  Last CTA chest showed collapsed left lung with compression from dilated RV, no PE, RLL infiltrate (aspiration versus PNA).  This appears to be primarily a mechanical problem with severe pectus and leftward shift of the heart with compression of the left lung.  He has now had a redo pectus repair (12/20) in [place] by Dr. [name].  He does not seem any better post-surgery, possibly worse.  He is on 4L home oxygen during the day, Bipap at night.  - Continue home oxygen and Bipap.  - He is now doing pulmonary rehab in [place].   - Weight loss should help with lung restriction (has significant abdominal obesity).  I will refer him to our pharmacy clinic for semaglutide as he is struggling to lose weight.   - ?Any other  options for treatment, consider referral to [facility] for evaluation, will discuss with pulmonary.  2. Chronic diastolic CHF with RV failure:  Patient presented with primarily right-sided CHF in 2010, likely due to pulmonary hypertension from a combination of left atrial compression and nocturnal hypoxemia. He is now using Bipap every night. He had a redo pectus surgery in 2011. I suspect the surgery relieved some of the compression on his lungs but followup cardiac MRI in 6/16 did show continued compression of the left atrium between the ascending and  descending aorta as well as a dilated RV with RV EF 37%.  Cardiac MRI in 6/16 was very similar to the MRI in 2010. RHC in 8/20 showed elevated right and left heart filling pressures with primarily pulmonary venous hypertension.  PAPI low but not markedly low, suggesting mild-moderate RV dysfunction. The MRI repeated in 8/20 showed left-shifted heart with marked RV enlargement and RVEF 31%, LV EF 52%.  The LA is compressed between ascending and descending aorta, which may explain elevated PCWP.  Echo in 4/21 showed EF 55% with compressed LA, D-shaped septum, normal RV size with mildly decreased systolic function. Echo in 6/22 with EF 50-55%, mildly dilated RV with normal systolic function, unable to estimate PASP.  NYHA class III symptoms likely pulmonary-related for the most part.  He is not volume overloaded on current Lasix dose.   - Continue Lasix 40 qam/20 qpm and KCl 40 daily.  BMET/BNP today.    - Continue spironolactone 25 mg daily.  - Continue Farxiga 10 mg daily.   - Continue Bipap at night and oxygen during the day to lower drive for hypoxemic pulmonary vasoconstriction.     2. 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  RHC  in 8/20 moderate pulmonary hypertension, PA pressure 51/30. Significant component of pulmonary venous hypertension.  There is unlikely to be a group 1 component to his PH, so unlikely that pulmonary vasodilators would be helpful.   3. Restrictive lung disease:  Lung disease from restriction due to pectus. He has had chronic hypercarbic respiratory failure. This is a major player in his chronic dyspnea. He has now had 3 operations for his pectus deformity. CT in 8/20 showed heart shifted left with compression of left lung. Most recent operation in [place] in 12/20 by Dr. [name].  He has not had significant improvement since surgery.  - Continue followup with Dr. [name].   - Weight loss should help decrease mechanical restriction => referral for semaglutide as above.  - ?Evaluation at [facility] center such as [facility].  4. Asthma: Chronic.   5. Cirrhosis: Abdominal US was concerning for cirrhosis.  He is not a heavy drinker.  My suspicion is that this may be related to RV failure. MRI liver in 10/20 showed hepatic hemangioma and lobulated liver contours concerning for cardiogenic hepatic congestion, no frank cirrhosis.  Abdominal US in 6/22 showed no ascites, normal-appearing liver.   Followup 4 months.    Loralie Champagne 06/30/2021

## 2021-07-05 ENCOUNTER — Telehealth: Payer: Self-pay | Admitting: Student-PharmD

## 2021-07-05 DIAGNOSIS — E6609 Other obesity due to excess calories: Secondary | ICD-10-CM

## 2021-07-05 NOTE — Telephone Encounter (Signed)
Received referral from Dr. Aundra Dubin to start semaglutide for weight loss for this patient. Patient meets criteria for semaglutide use as he has obesity with BMI 30.63 and has failed to lose weight with lifestyle interventions alone. Obesity is complicated by chronic medical conditions of heart failure, asthma, GERD, restrictive lung disease on 4L O2.   [place] plan typically covers [phone] (requires PA). Due to supply chain shortages with [facility], will need to start with Ozempic. State health plan covers Ozempic without a PA. Both are tier 2 medications.   Called the patient who is interested in starting semaglutide for weight loss. Confirmed that he does not have a personal or family history of medullary thyroid carcinoma. Will submit a PA for [place] to ensure it is approved. Then will send in [place] [facility] [place] and check on copay. Discussed dose titration and possible adverse effects.   He already has an appt with the [facility] on 11/2. I will follow up on the PA status and ensure that he is able to pick up his Ozempic in time for this appt or reschedule if needed.   PA submitted 10/27.

## 2021-07-06 MED ORDER — OZEMPIC (0.25 OR 0.5 MG/DOSE) 2 MG/1.5ML ~~LOC~~ SOPN: PEN_INJECTOR | SUBCUTANEOUS | 1 refills | Status: DC

## 2021-07-06 NOTE — Telephone Encounter (Signed)
Patient returned call and he is aware of the below. Instructed him to bring Ozempic to his appt next week so he can give his first injection in the office and he confirmed understanding.

## 2021-07-06 NOTE — Telephone Encounter (Signed)
PA for [facility] approved through 02/02/22. Sent Rx for Ozempic 0.25 mg weekly. Confirmed with pharmacy that prescription went through without a PA and is $0. He will need to pick this up and bring to his pharmacy clinic visit on 11/2.   Called patient to inform him of the above. Unable to reach, LVM requesting call back.

## 2021-07-11 ENCOUNTER — Ambulatory Visit (INDEPENDENT_AMBULATORY_CARE_PROVIDER_SITE_OTHER): Payer: BC Managed Care – PPO | Admitting: Pharmacist

## 2021-07-11 ENCOUNTER — Other Ambulatory Visit: Payer: Self-pay

## 2021-07-11 VITALS — BP 120/72 | Ht 76.0 in | Wt 251.6 lb

## 2021-07-11 DIAGNOSIS — E6609 Other obesity due to excess calories: Secondary | ICD-10-CM

## 2021-07-11 DIAGNOSIS — E669 Obesity, unspecified: Secondary | ICD-10-CM

## 2021-07-11 NOTE — Patient Instructions (Addendum)
Good resources:  Eat. Sleep. Move. Breath: The Beginner's Guide to Living A Healthy Lifestyle  Eat, Drink, and Be Healthy: The [employer] Guide to Healthy Eating (2007 version)  Zoe podcast  GLP-1 Receptor Agonist Counseling Points This medication reduces your appetite and may make you feel fuller longer.  Stop eating when your body tells you that you are full. This will likely happen sooner than you are used to. Store your medication in the fridge until you are ready to use it. Inject your medication in the fatty tissue of your lower abdominal area (2 inches away from belly button) or upper outer thigh. Rotate injection sites. Each pen will last you about 1 month (the first month it will last a few weeks longer). Use a different needle with each weekly injection. Common side effects include: nausea, diarrhea/constipation, and heartburn, and are more likely to occur if you overeat.  Dosing schedule: - Week 1: Inject 0.25 once weekly - Week 2: Inject 0.25 once weekly - Week 3: Inject 0.25 once weekly - Week 4: Inject 0.25 once weekly  Tips for living a healthier life  SUGAR  Sugar is a huge problem in the modern day diet. Sugar is a big contributor to heart disease, diabetes, high triglyceride levels, fatty liver disease and obesity. Sugar is hidden in almost all packaged foods/beverages. Added sugar is extra sugar that is added beyond what is naturally found and has no nutritional benefit for your body. The American Heart Association recommends limiting added sugars to no more than 25g for women and 36 grams for men per day. There are many names for sugar including maltose, sucrose (names ending in "ose"), high fructose corn syrup, molasses, cane sugar, corn sweetener, raw sugar, syrup, honey or fruit juice concentrate.   One of the best ways to limit your added sugars is to stop drinking sweetened beverages such as soda, sweet tea, and fruit juice. There is 65g of added  sugars in one 20oz bottle of Coke! That is equal to 6 donuts.   Pay attention and read all nutrition facts labels. Below is an examples of a nutrition facts label. The #1 is showing you the total sugars where the # 2 is showing you the added sugars. This one serving has almost the max amount of added sugars per day!     EXERCISE  Exercise is good. We've all heard that. In an ideal world, we would all have time and resources to get plenty of it. When you are active, your heart pumps more efficiently and you will feel better.  Multiple studies show that even walking regularly has benefits that include living a longer life. The American Heart Association recommends 150 minutes per week of exercise (30 minutes per day most days of the week). You can do this in any increment you wish. Nine or more 10-minute walks count. So does an hour-long exercise class. Break the time apart into what will work in your life. Some of the best things you can do include walking briskly, jogging, cycling or swimming laps. Not everyone is ready to "exercise." Sometimes we need to start with just getting active. Here are some easy ways to be more active throughout the day:  Take the stairs instead of the elevator  Go for a 10-15 minute walk during your lunch break (find a friend to make it more enjoyable)  When shopping, park at the back of the parking lot  If you take public transportation, get off one  stop early and walk the extra distance  Pace around while making phone calls  Check with your doctor if you aren't sure what your limitations may be. Always remember to drink plenty of water when doing any type of exercise. Don't feel like a failure if you're not getting the 90-150 minutes per week. If you started by being a couch potato, then just a 10-minute walk each day is a huge improvement. Start with little victories and work your way up.   HEALTHY EATING TIPS  When looking to improve your eating habits, whether to  lose weight, lower blood pressure or just be healthier, it helps to know what a serving size is.   Grains 1 slice of bread,  bagel,  cup pasta or rice  Vegetables 1 cup fresh or raw vegetables,  cup cooked or canned Fruits 1 piece of medium sized fruit,  cup canned,   Meats/Proteins  cup dried       1 oz meat, 1 egg,  cup cooked beans, nuts or seeds  Dairy        Fats Individual yogurt container, 1 cup (8oz)    1 teaspoon margarine/butter or vegetable  milk or milk alternative, 1 slice of cheese          oil; 1 tablespoon mayonnaise or salad dressing                  Plan ahead: make a menu of the meals for a week then create a grocery list to go with that menu. Consider meals that easily stretch into a night of leftovers, such as stews or casseroles. Or consider making two of your favorite meal and put one in the freezer for another night. Try a night or two each week that is "meatless" or "no cook" such as salads. When you get home from the grocery store wash and prepare your vegetables and fruits. Then when you need them they are ready to go.   Tips for going to the grocery store:  [employer] store or generic brands  Check the weekly ad from your store on-line or in their in-store flyer  Look at the unit price on the shelf tag to compare/contrast the costs of different items  Buy fruits/vegetables in season  Carrots, bananas and apples are low-cost, naturally healthy items  If meats or frozen vegetables are on sale, buy some extras and put in your freezer  Limit buying prepared or "ready to eat" items, even if they are pre-made salads or fruit snacks  Do not shop when you're hungry  Foods at eye level tend to be more expensive. Look on the high and low shelves for deals.  Consider shopping at the farmer's market for fresh foods in season.  Avoid the cookie and chip aisles (these are expensive, high in calories and low in nutritional value). Shop on the outside of the grocery  store.  Healthy food preparations:  If you can't get lean hamburger, be sure to drain the fat when cooking  Steam, saut (in olive oil), grill or bake foods  Experiment with different seasonings to avoid adding salt to your foods. Kosher salt, sea salt and Himalayan salt are all still salt and should be avoided. Try seasoning food with onion, garlic, thyme, rosemary, basil ect. Onion powder or garlic powder is ok. Avoid if it says salt (ie garlic salt).        Other resources: American Heart Association - InstantFinish.fi  Go to the Healthy Living tab to get more information American Diabetes Association - www.diabetes.org         You don't have to be diabetic - check out the Food and Fitness tab

## 2021-07-11 NOTE — Progress Notes (Signed)
Patient ID: [name]                 DOB: 12-31-1980                    MRN: [id number]     HPI: [name] is a 40 y.o. male patient referred to pharmacy clinic by Dr. Aundra Dubin to initiate weight loss therapy with GLP1-RA. 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 Most recent BMI 30.63.  Patient presents to clinic today. He is in a wheel chair, but can walk independently. Wearing portable O2. He states that he is not working right now. His wife is in grad school and works during the day so he is tasked with cooking dinner. He makes things that are easy. A lot of processed foods thrown in the air fryer. Drinks 2 sodas a day. Does typically avoid red meat. Completing pulmonary rehab.   Current weight management medications: none  Previously tried meds: none  Current meds that may affect weight: buspar  Baseline weight/BMI: 251lb/30.6 kg/m2  Insurance payor:  [employer] state health plan  Diet:  -Breakfast: egg (3) and cheese omelette, frost flakes -Lunch: lefts overs from dinner -Dinner: beef tacos, "probably a lot of things with preservatives" microwave mashed potatoes, something he can throw in air fryer (chicken nuggets/fries), does have frozen vegetables- chick fil A once a week -Snacks: cheese sticks, granola bars -Drinks: water, coke (2 per day), cool aid  Exercise: pulmonary rehab, walks dog around the block  Family History:   Social History: ETOH- 2 beers per week, former smoker, previous illict drug use  Labs: Lab Results  Component Value Date   HGBA1C 5.4 04/25/2019    Wt Readings from Last 1 Encounters:  06/29/21 251 lb 9.6 oz (114.1 kg)    BP Readings from Last 1 Encounters:  06/29/21 130/60   Pulse Readings from Last 1 Encounters:  06/29/21 71       Component Value Date/Time   CHOL 151 08/08/2017 1620   TRIG 116 08/08/2017  1620   HDL 42 08/08/2017 1620   CHOLHDL 3.6 08/08/2017 1620   VLDL 23 08/08/2017 1620   Central 86 08/08/2017 1620    Past Medical History:  Diagnosis Date   Asthma    CHF (congestive heart failure) (HCC)    Chronic hypercapnic respiratory failure (HCC)    nocturnal  hypoxemic resp failure, on bipap   Pectus excavatum    Restrictive lung disease     Current Outpatient Medications on File Prior to Visit  Medication Sig Dispense Refill   acetaminophen (TYLENOL) 500 MG tablet Take 1,000 mg by mouth every 6 (six) hours as needed for mild pain.     albuterol (PROAIR HFA) 108 (90 Base) MCG/ACT inhaler Inhale 2 puffs into the lungs every 6 (six) hours as needed for wheezing or shortness of breath. 8.5 each 6   bisoprolol (ZEBETA) 5 MG tablet TAKE 1 TABLET BY MOUTH EVERY DAY 90 tablet 3   busPIRone (BUSPAR) 15 MG tablet Take 15 mg by mouth 3 (three) times daily.     cetirizine (ZYRTEC) 10 MG tablet Take 1 tablet (10 mg total) by mouth daily. 30 tablet 2   dapagliflozin propanediol (FARXIGA) 10 MG TABS tablet Take 1 tablet (10 mg total) by mouth daily before breakfast. 90 tablet 3   FLUoxetine (PROZAC) 20 MG capsule Take 20 mg  by mouth daily.     furosemide (LASIX) 20 MG tablet TAKE 2 TABLETS EVERY MORNING AND 1 TABLET EVERY EVENING NEEDS DOCTOR APPOINTMENT 270 tablet 1   isosorbide mononitrate (IMDUR) 30 MG 24 hr tablet TAKE 1 TABLET (30 MG TOTAL) BY MOUTH DAILY. NEED APPT FOR FUTURE REFILLS [phone] 90 tablet 3   Melatonin 10 MG TABS Take 10 mg by mouth daily as needed (sleep).     mirtazapine (REMERON) 15 MG tablet Take 15 mg by mouth at bedtime.     omeprazole (PRILOSEC OTC) 20 MG tablet Take 20 mg by mouth daily.     Potassium Chloride ER 20 MEQ TBCR TAKE 2 TABLETS BY MOUTH EVERY DAY 180 tablet 3   Semaglutide,0.25 or 0.5MG /DOS, (OZEMPIC, 0.25 OR 0.5 MG/DOSE,) 2 MG/1.5ML SOPN Inject 0.25 mg into the abdomen once weekly. After 4 weeks, increase to 0.5 mg once weekly. 1.5 mL 1    spironolactone (ALDACTONE) 25 MG tablet TAKE 1 TABLET BY MOUTH EVERY DAY 90 tablet 1   No current facility-administered medications on file prior to visit.    Allergies  Allergen Reactions   Other Itching and Other (See Comments)    Pet Dander     Assessment/Plan:  1. Weight loss - Patient has not met goal of at least 5% of body weight loss with comprehensive lifestyle modifications alone in the past 3-6 months. Pharmacotherapy is appropriate to pursue as augmentation. Will start Ozempic and transition to [facility]. Confirmed patient not pregnant and no personal or family history of medullary thyroid carcinoma (MTC) or Multiple Endocrine Neoplasia syndrome type 2 (MEN 2).   Advised patient on common side effects including nausea, diarrhea, dyspepsia, decreased appetite, and fatigue. Counseled patient on reducing meal size and how to titrate medication to minimize side effects. Counseled patient to call if intolerable side effects or if experiencing dehydration, abdominal pain, or dizziness. Patient will adhere to dietary modifications and will target at least 150 minutes of moderate intensity exercise weekly.   Discussed the importance of decreasing soda/sugary drink consumption. Encouraged less processed foods. Recommended the book eat, drink and be healthy (has several recipes in the back) and other helpful resources. Encouraged walking at home as able and to continue with rehab.  Follow up in 3 months.

## 2021-07-12 LAB — HEMOGLOBIN A1C
Est. average glucose Bld gHb Est-mCnc: 105 mg/dL
Hgb A1c MFr Bld: 5.3 % (ref 4.8–5.6)

## 2021-07-12 LAB — LIPID PANEL
Chol/HDL Ratio: 3.8 ratio (ref 0.0–5.0)
Cholesterol, Total: 129 mg/dL (ref 100–199)
HDL: 34 mg/dL — ABNORMAL LOW (ref >39)
LDL Chol Calc (NIH): 74 mg/dL (ref 0–99)
Triglycerides: 112 mg/dL (ref 0–149)
VLDL Cholesterol Cal: 21 mg/dL (ref 5–40)

## 2021-08-15 ENCOUNTER — Ambulatory Visit (INDEPENDENT_AMBULATORY_CARE_PROVIDER_SITE_OTHER): Payer: BC Managed Care – PPO | Admitting: Primary Care

## 2021-08-15 ENCOUNTER — Other Ambulatory Visit: Payer: Self-pay

## 2021-08-15 ENCOUNTER — Encounter: Payer: Self-pay | Admitting: Primary Care

## 2021-08-15 DIAGNOSIS — J9611 Chronic respiratory failure with hypoxia: Secondary | ICD-10-CM

## 2021-08-15 DIAGNOSIS — I5032 Chronic diastolic (congestive) heart failure: Secondary | ICD-10-CM | POA: Diagnosis not present

## 2021-08-15 DIAGNOSIS — J0191 Acute recurrent sinusitis, unspecified: Secondary | ICD-10-CM | POA: Diagnosis not present

## 2021-08-15 DIAGNOSIS — J9612 Chronic respiratory failure with hypercapnia: Secondary | ICD-10-CM | POA: Diagnosis not present

## 2021-08-15 MED ORDER — PREDNISONE 10 MG PO TABS: ORAL_TABLET | ORAL | 0 refills | Status: DC

## 2021-08-15 MED ORDER — AMOXICILLIN-POT CLAVULANATE 875-125 MG PO TABS: 1.0000 | ORAL_TABLET | Freq: Two times a day (BID) | ORAL | 0 refills | Status: DC

## 2021-08-15 NOTE — Progress Notes (Signed)
$'@Patient'F$  ID: [name], male    DOB: 1981/01/05, 41 y.o.   MRN: [id number]  No chief complaint on file.   Referring provider: [name], PA-C  HPI: 40 year old male, former smoker quit 2008 (1 pack year history).  Past medical history significant for pectus excavatum, restrictive lung disease, chronic respiratory failure, diastolic CHF, pulmonary hypertension (group 2, group 3). Former patient of Dr. Lake Bells, now established with Dr. [name].    He has had 2 reconstructive surgeries for his pectus excavatum, first in childhood at the age of 14 (Dr. Lilia Pro in [place]) and again in 2011 with [facility].  He was reevaluated in 2019 for further surgical options at [facility] where it was determined that there were none.  He is maintained on continuous oxygen 24/7 at 3 L and Trilogy ventilator QHS.  Compliance with oxygen has always been a little bit of a struggle.  He also follows with heart failure clinic and is on oral Lasix.   Previous [facility] pulmonary encounter: 08/01/2020 Patient contacted today for video visit to renew oxygen. Patient called our office on 07/28/20 stating that his oxygen concentrator from a friend stopped working on 07/25/20. A new order was sent to his DME company but they never received it. Per [employer] he returned his oxygen equipment in June 2020 stating that he no longer needed it and signed an AMA. He needs OV today for O2 re-qualification to restart oxygen. He uses 3-4L oxygen continuously. He is wearing Triology ventilator at night. Confirmed O2 86% at rest by video. Continue pulmonary rehab.   08/08/2020 Patient presents today for 1 week follow-up chronic respiratory failure. He is doing well. He has a slight np cough, mostly related to PND symptoms. States that he was outside at a football game Friday 08/04/20. No hospitalizations since last December 2020 when he had redo pectus repair surgery in [place]. He is 100% compliant with  oxygen use and Trilogy ventilator. He uses 3-4L oxygen at rest and 4L on exertion. He will occasionally pump O2 up to 6L with heavy exercise. Patient clarified today that he is looking to purchase a portable oxygen concentrator. He received a new machine last week but needs order and qualifying walk today. Patient is looking to buy POC outright and not rent it as he has already met his deductible. He is scheduled for routine follow-up with Dr. [name] in January 2021. He needs ABGs prior.   04/13/2021 Patient presents today for hospital follow-up. He was admitted from 04/03/21-04/07/21 for acute respiratory failure with hypoxia and hypercapnia d/t probable pneumonia in the setting of restrictive lung disease and chronic diastolic heart failure. He was initially treated with BIPAP and started on empiric antibiotics. Weaned to baseline 5L and transitioned back to oral antibiotics.   He is doing well today, states that his breathing is better. He has chronic chest discomfort/chest tightness. He is using Trilogy at night as directed, wearing on average 8-10 hours a night. Baseline O2 5L pulsed. He would like to switch rehab programs, he is current attending [facility]. He is currently on disability. Completed additional paperwork today. Denies fever, chills, confusion, cough, wheezing, N/V/D.   05/28/2021 -   Chief Complaint  Patient presents with   Follow-up    Pt states that he has been doing okay since his last hospital stay.    Follow-up chronic hypoxemic and hypercapnic respiratory failure with pectus excavatum  [name] 40 y.o. -admitted July  2022. Now subjectively baseline. 5L [place]. Uses BiPAP /trilogy at night. BMI is 30 . HE feels visceral obesity impacting respiration. CXR bilateral pneumonia I nJuly. Personally visualized. Asking for dietary advice. We did discuss tracheostomy as last resort if things got bad   08/15/2021- Interim hx  Patient presents today for acute  OV for sinusitis. Hx chronic hypercarbibn/hypoxemic respiratory failure, chronic diastolic HF, pulmonary HTN. He developed sinus congestion and post nasal drip 3-4 days ago. Associated voice hoarseness and throat clearing. He has very slight chest tightness. He continues on 4-5L oxygen which is his baseline. He has not needed to use his rescue inhaler recently but has it available. He is wearing Trilogy ventilator at night and while napping with O2 blended in. He is compliant with lasix 40mg  in morning and 20mg  at bedtime; along with spironolactone 25mg  daily. Recently started ozempic in [place] to help with weight loss. He is still participating in pulmonary rehab. DME compant is [employer].    SYMPTOM SCALE - 05/25/2019  07/30/2019  09/26/2020  08/15/2021   O2 use  2-3L at rest and 3-4 with exertion and TRiology qhs Same o2 and triology use. Has gained weight Same O2 and trilogy at night  Shortness of Breath 0 -> 5 scale with 5 being worst (score 6 If unable to do)     At rest 0 2 2 1.5  Simple tasks - showers, clothes change, eating, shaving 0 3 3 3.5  Household (dishes, doing bed, laundry) 3 4 4 4   Shopping 1 4 3 4   Walking level at own pace 2 3 3 4   Walking up Stairs 4 5 4 5   Total (40 - 48) Dyspnea Score 10 21 20 22   How bad is your cough? 0 n/a 0 2.5  How bad is your fatigue 0 3 2 2   nausea   0 0  vomit   0 0  diarrhea   0 0  anxiety   2 2.5  depression   2 1.5      Allergies  Allergen Reactions   Other Itching and Other (See Comments)    Pet Dander    Immunization History  Administered Date(s) Administered   Influenza Split 06/16/2012, 06/27/2012, 06/08/2013, 06/09/2014, 06/28/2014, 08/10/2015, 05/28/2018, 05/11/2019   Influenza Whole 06/09/2010, 06/10/2011   Influenza,inj,Quad PF,6+ Mos 06/08/2013, 08/10/2015, 05/28/2018, 05/11/2019   Influenza,inj,Quad PF,6-35 Mos 05/29/2020   Influenza,inj,quad, With Preservative 06/08/2013, 08/10/2015   Influenza-Unspecified  06/09/2014, 05/22/2020   Moderna Sars-Covid-2 Vaccination 11/26/2019, 12/27/2019   Tdap 09/10/2009    Past Medical History:  Diagnosis Date   Asthma    CHF (congestive heart failure) (HCC)    Chronic hypercapnic respiratory failure (HCC)    nocturnal  hypoxemic resp failure, on bipap   Pectus excavatum    Restrictive lung disease     Tobacco History: Social History   Tobacco Use  Smoking Status Former   Packs/day: 0.10   Years: 1.00   Pack years: 0.10   Types: Cigarettes   Quit date: 09/09/2006   Years since quitting: 14.9  Smokeless Tobacco Never   Counseling given: Not Answered   Outpatient Medications Prior to Visit  Medication Sig Dispense Refill   acetaminophen (TYLENOL) 500 MG tablet Take 1,000 mg by mouth every 6 (six) hours as needed for mild pain.     albuterol (PROAIR HFA) 108 (90 Base) MCG/ACT inhaler Inhale 2 puffs into the lungs every 6 (six) hours as needed for wheezing or shortness of breath. 8.5 each 6  bisoprolol (ZEBETA) 5 MG tablet TAKE 1 TABLET BY MOUTH EVERY DAY 90 tablet 3   busPIRone (BUSPAR) 15 MG tablet Take 15 mg by mouth 3 (three) times daily.     cetirizine (ZYRTEC) 10 MG tablet Take 1 tablet (10 mg total) by mouth daily. 30 tablet 2   dapagliflozin propanediol (FARXIGA) 10 MG TABS tablet Take 1 tablet (10 mg total) by mouth daily before breakfast. 90 tablet 3   furosemide (LASIX) 20 MG tablet TAKE 2 TABLETS EVERY MORNING AND 1 TABLET EVERY EVENING NEEDS DOCTOR APPOINTMENT 270 tablet 1   isosorbide mononitrate (IMDUR) 30 MG 24 hr tablet TAKE 1 TABLET (30 MG TOTAL) BY MOUTH DAILY. NEED APPT FOR FUTURE REFILLS [phone] 90 tablet 3   Melatonin 10 MG TABS Take 10 mg by mouth daily as needed (sleep).     mirtazapine (REMERON) 15 MG tablet Take 15 mg by mouth at bedtime.     omeprazole (PRILOSEC OTC) 20 MG tablet Take 20 mg by mouth daily.     Potassium Chloride ER 20 MEQ TBCR TAKE 2 TABLETS BY MOUTH EVERY DAY 180 tablet 3   Semaglutide,0.25 or  0.$Rem'5MG'ssmE$ /DOS, (OZEMPIC, 0.25 OR 0.5 MG/DOSE,) 2 MG/1.5ML SOPN Inject 0.25 mg into the abdomen once weekly. After 4 weeks, increase to 0.5 mg once weekly. 1.5 mL 1   spironolactone (ALDACTONE) 25 MG tablet TAKE 1 TABLET BY MOUTH EVERY DAY 90 tablet 1   FLUoxetine (PROZAC) 20 MG capsule Take 20 mg by mouth daily. (Patient not taking: Reported on 08/15/2021)     No facility-administered medications prior to visit.    Review of Systems  Review of Systems  Constitutional: Negative.   HENT:  Positive for congestion, facial swelling and postnasal drip.   Respiratory:  Positive for cough and shortness of breath.   Cardiovascular: Negative.     Physical Exam  BP 110/60 (BP Location: Left Arm, Cuff Size: Large)   Pulse 89   Temp 98.4 F (36.9 C) (Oral)   Ht $R'6\' 4"'IY$  (1.93 m)   Wt 248 lb (112.5 kg)   SpO2 93%   BMI 30.19 kg/m  Physical Exam Constitutional:      Appearance: Normal appearance.  HENT:     Head: Normocephalic and atraumatic.     Mouth/Throat:     Comments: Mallampati class I; No angioedema Eyes:     Comments: Puffy eyes  Cardiovascular:     Rate and Rhythm: Normal rate and regular rhythm.     Comments: No leg swelling Pulmonary:     Effort: Pulmonary effort is normal.     Breath sounds: Normal breath sounds. No wheezing, rhonchi or rales.     Comments: CTA Musculoskeletal:        General: Normal range of motion.  Skin:    General: Skin is warm and dry.  Neurological:     General: No focal deficit present.     Mental Status: He is alert and oriented to person, place, and time. Mental status is at baseline.  Psychiatric:        Mood and Affect: Mood normal.        Behavior: Behavior normal.        Thought Content: Thought content normal.        Judgment: Judgment normal.     Lab Results:  CBC    Component Value Date/Time   WBC 7.2 04/04/2021 0336   RBC 4.14 (L) 04/04/2021 0336   HGB 12.1 (L) 04/04/2021 0336   HCT  40.5 04/04/2021 0336   PLT 143 (L) 04/04/2021  0336   MCV 97.8 04/04/2021 0336   MCH 29.2 04/04/2021 0336   MCHC 29.9 (L) 04/04/2021 0336   RDW 14.3 04/04/2021 0336   LYMPHSABS 1.0 04/27/2019 0522   MONOABS 0.6 04/27/2019 0522   EOSABS 0.0 04/27/2019 0522   BASOSABS 0.0 04/27/2019 0522    BMET    Component Value Date/Time   NA 140 06/29/2021 1205   K 4.2 06/29/2021 1205   CL 91 (L) 06/29/2021 1205   CO2 42 (H) 06/29/2021 1205   GLUCOSE 110 (H) 06/29/2021 1205   BUN 13 06/29/2021 1205   CREATININE 1.06 06/29/2021 1205   CALCIUM 8.8 (L) 06/29/2021 1205   GFRNONAA >60 06/29/2021 1205   GFRAA >60 04/18/2020 1244    BNP    Component Value Date/Time   BNP 12.0 06/29/2021 1205    ProBNP    Component Value Date/Time   PROBNP 62.0 03/22/2019 1012    Imaging: No results found.   Assessment & Plan:   Acute sinusitis - Patient developed sinus congestion and PND symptoms 3-4 days ago. Associated voice hoarseness, facial/eye swelling and chest tightness. Lungs were clear on exam. Throat was clear without angioedema. Sending in RX Augmentin 1 tab twice daily x 7 days and prednisone $RemoveBefore'20mg'eAmkJdfUaCHXY$  x 5 days;$Remove'10mg'tqFXIkm$  x 5 days. Advised he start flonase nasal spray and continue Zyrtec daily. FU if symptoms do not improve or worse.   Chronic respiratory failure with hypoxia and hypercapnia (HCC) - Patient is maintained on 5L oxygen at baseline, no new requirements - Continue Triology ventilator at night with O2. DME is [employer]   Chronic diastolic CHF (congestive heart failure) (HCC) - No evidence of volume overload on exam - Continue lasix $RemoveBefore'40mg'BeNBUEQqIxRBJ$  qa'20mg'$  qpm; Spirolactone $RemoveBeforeDEI'25mg'xJktbdtEWQmdfrcv$  daily   - Following with Dr. Aundra Dubin with heart failure clinic    Martyn Ehrich, NP 08/15/2021

## 2021-08-15 NOTE — Patient Instructions (Addendum)
Recommendations: - Use flonase daily for 1-2 weeks  - Continue Zyrtec 10mg  daily  - Make sure you are taking lasix 40mg  (two 20mg  tablets) in morning/ 20mg  (1 tablet) in the evening   Rx: - Prednisone 20mg  x 5 days; 10mg  x 5 days - Augmentin 1 tablet twice daily x 7 days  Follow-up: - 2-3 months with Dr. [name]

## 2021-08-15 NOTE — Assessment & Plan Note (Signed)
-   Patient developed sinus congestion and PND symptoms 3-4 days ago. Associated voice hoarseness, facial/eye swelling and chest tightness. Lungs were clear on exam. Throat was clear without angioedema. Sending in RX Augmentin 1 tab twice daily x 7 days and prednisone 20mg  x 5 days;10mg  x 5 days. Advised he start flonase nasal spray and continue Zyrtec daily. FU if symptoms do not improve or worse.

## 2021-08-15 NOTE — Assessment & Plan Note (Addendum)
-   No evidence of volume overload on exam - Continue lasix 40mg  qam/20mg  qpm; Spirolactone 25mg  daily   - Following with Dr. Aundra Dubin with heart failure clinic

## 2021-08-15 NOTE — Assessment & Plan Note (Signed)
-   Patient is maintained on 5L oxygen at baseline, no new requirements - Continue Triology ventilator at night with O2. DME is [employer]

## 2021-08-16 ENCOUNTER — Telehealth: Payer: Self-pay | Admitting: Internal Medicine

## 2021-08-16 ENCOUNTER — Telehealth: Payer: Self-pay | Admitting: Primary Care

## 2021-08-16 ENCOUNTER — Other Ambulatory Visit: Payer: Self-pay | Admitting: Internal Medicine

## 2021-08-16 ENCOUNTER — Other Ambulatory Visit (HOSPITAL_COMMUNITY): Payer: Self-pay | Admitting: Cardiology

## 2021-08-16 MED ORDER — HYDROCODONE BIT-HOMATROP MBR 5-1.5 MG/5ML PO SOLN: 5.0000 mL | Freq: Every day | ORAL | 0 refills | Status: DC | PRN

## 2021-08-16 MED ORDER — HYDROCODONE BIT-HOMATROP MBR 5-1.5 MG/5ML PO SOLN
5.0000 mL | Freq: Every day | ORAL | 0 refills | Status: DC | PRN
Start: 2021-08-16 — End: 2022-02-12

## 2021-08-16 NOTE — Telephone Encounter (Signed)
done

## 2021-08-16 NOTE — Telephone Encounter (Signed)
Instructions   Return in about 3 months (around 11/13/2021). Recommendations: - Use flonase daily for 1-2 weeks  - Continue Zyrtec 10mg  daily  - Make sure you are taking lasix 40mg  (two 20mg  tablets) in morning/ 20mg  (1 tablet) in the evening    Rx: - Prednisone 20mg  x 5 days; 10mg  x 5 days - Augmentin 1 tablet twice daily x 7 days   Follow-up: - 2-3 months with Dr. Vincent Peyer with the pt. He states that he was under the impression that Beth was sending rx for cough syrup. I do not see mention of this in the ov notes, and I advised pt will have to check with [facility] about this and call him back. Pt verbalized understanding. Please advise, thanks!

## 2021-08-16 NOTE — Telephone Encounter (Signed)
On call- patient asked Hycodan script be redirected to [facility]. Other store was out. Hycodan script redirected to [facility].

## 2021-08-16 NOTE — Telephone Encounter (Signed)
If he wants Hycodan cough syrup 5 mL once daily you can send an electronic prescription.  I can cosign it and use fingerprint sign.  I am here till 5:40 PM today which is another 20 minutes

## 2021-08-17 ENCOUNTER — Ambulatory Visit: Payer: BC Managed Care – PPO | Admitting: Nurse Practitioner

## 2021-08-24 MED ORDER — OSELTAMIVIR PHOSPHATE 75 MG PO CAPS: 75.0000 mg | ORAL_CAPSULE | Freq: Two times a day (BID) | ORAL | 0 refills | Status: DC

## 2021-08-24 NOTE — Telephone Encounter (Signed)
LOV 12/7/22Eustaquio Maize, NP  Message routed to [facility], NP to advise  I just took my son to the doc and he has the flu his doctor thought I should reach out to you to see if I should get some tamiflu to help fight off his flu since Ive been the one taking care of him.    Thank you

## 2021-08-24 NOTE — Telephone Encounter (Signed)
I have [facility] [place]

## 2021-09-20 ENCOUNTER — Other Ambulatory Visit (HOSPITAL_COMMUNITY): Payer: Self-pay | Admitting: Cardiology

## 2021-09-20 DIAGNOSIS — J9612 Chronic respiratory failure with hypercapnia: Secondary | ICD-10-CM

## 2021-09-20 DIAGNOSIS — I50812 Chronic right heart failure: Secondary | ICD-10-CM

## 2021-09-20 DIAGNOSIS — I272 Pulmonary hypertension, unspecified: Secondary | ICD-10-CM

## 2021-09-20 DIAGNOSIS — E6609 Other obesity due to excess calories: Secondary | ICD-10-CM

## 2021-09-20 DIAGNOSIS — I509 Heart failure, unspecified: Secondary | ICD-10-CM

## 2021-09-20 DIAGNOSIS — J9611 Chronic respiratory failure with hypoxia: Secondary | ICD-10-CM

## 2021-09-21 ENCOUNTER — Telehealth: Payer: Self-pay | Admitting: Internal Medicine

## 2021-09-21 NOTE — Telephone Encounter (Signed)
Mel Almond, did you receive the request for medical records? Thanks!

## 2021-09-24 ENCOUNTER — Telehealth: Payer: Self-pay | Admitting: Pharmacist

## 2021-09-24 MED ORDER — SEMAGLUTIDE-WEIGHT MANAGEMENT 2.4 MG/0.75ML ~~LOC~~ SOAJ: 2.4000 mg | SUBCUTANEOUS | 11 refills | Status: DC

## 2021-09-24 MED ORDER — SEMAGLUTIDE-WEIGHT MANAGEMENT 1 MG/0.5ML ~~LOC~~ SOAJ: 1.0000 mg | SUBCUTANEOUS | 0 refills | Status: DC

## 2021-09-24 MED ORDER — SEMAGLUTIDE-WEIGHT MANAGEMENT 1.7 MG/0.75ML ~~LOC~~ SOAJ: 1.7000 mg | SUBCUTANEOUS | 0 refills | Status: DC

## 2021-09-24 NOTE — Telephone Encounter (Signed)
Patient called back. He missed his dose last wed bc he didn't realize he was out. Will resume as soon as possible. His pharmacy did not have wegovy 1mg  but they were going to try to find a pharmacy that had it.  I advised he call back if he needs [place] to switch I back to the ozempic 1mg . I will call pt in 4 weeks before he increased to 1.7mg .

## 2021-09-24 NOTE — Telephone Encounter (Signed)
I did not receive a medical record request, but I did receive a long term disability claim from [place]. I will send back to [facility] for signature today.

## 2021-09-24 NOTE — Telephone Encounter (Signed)
Patient due to increase to Ozempic 1mg  weekly. [name] should now be available. I sent in Rx for Wegovy 1mg  (PA already approved).  Called pt and LVM for him to call back to discuss

## 2021-09-25 NOTE — Telephone Encounter (Signed)
Form signed and notes faxed to insurance company.

## 2021-10-08 NOTE — Progress Notes (Signed)
Patient ID: [name]                 DOB: [birth year]                    MRN: [id number]     HPI: [name] is a 41 y.o. male patient referred to pharmacy clinic by Dr. Aundra Dubin to initiate weight loss therapy with GLP1-RA. 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 Baseline BMI 30.63.  Today, patient arrives in good spirits walking independently and wearing portable O2. He took his third dose of Wegovy 1 mg today (injects on Tuesdays now) and next week will be his 4th dose. Denies any adverse effects with Wegovy. Missed 1 dose but resumed the week after. He has been working on improving his diet and cooking more at home but reports he needs to be better about exercise. Has lost 2.2% of body weight from baseline.   Current weight management medications: Wegovy 1 mg weekly  Previously tried meds: none  Current meds that may affect weight: buspirone  Baseline weight/BMI: 251lb/30.6 kg/m2  Insurance payor:  [employer] state health plan. Wegovy PA approved through 02/02/22 and cost is $0.   Diet:  -Breakfast: cheese toast or cereal -Lunch: leftovers from dinner -Dinner: cooking more at home now, grilled chicken or pork chops, at least 1 vegetable, 1 starch like potatoes or rice. Goes out 1-2 times per week -Snacks: apple sauce, banana, cheese stick -Drinks: water, coke (~1 per day)  Exercise: pulmonary rehab, silver sneakers, walks dog around the block  Social History: ETOH- 2 beers per week, former smoker, previous illict drug use  Labs: Lab Results  Component Value Date   HGBA1C 5.3 07/11/2021    Wt Readings from Last 1 Encounters:  08/15/21 248 lb (112.5 kg)    BP Readings from Last 1 Encounters:  08/15/21 110/60   Pulse Readings from Last 1 Encounters:  08/15/21 89       Component Value Date/Time   CHOL 129 07/11/2021 1003   TRIG 112  07/11/2021 1003   HDL 34 (L) 07/11/2021 1003   CHOLHDL 3.8 07/11/2021 1003   CHOLHDL 3.6 08/08/2017 1620   VLDL 23 08/08/2017 1620   LDLCALC 74 07/11/2021 1003    Past Medical History:  Diagnosis Date   Asthma    CHF (congestive heart failure) (HCC)    Chronic hypercapnic respiratory failure (HCC)    nocturnal  hypoxemic resp failure, on bipap   Pectus excavatum    Restrictive lung disease     Current Outpatient Medications on File Prior to Visit  Medication Sig Dispense Refill   acetaminophen (TYLENOL) 500 MG tablet Take 1,000 mg by mouth every 6 (six) hours as needed for mild pain.     albuterol (PROAIR HFA) 108 (90 Base) MCG/ACT inhaler Inhale 2 puffs into the lungs every 6 (six) hours as needed for wheezing or shortness of breath. 8.5 each 6   amoxicillin-clavulanate (AUGMENTIN) 875-125 MG tablet Take 1 tablet by mouth 2 (two) times daily. 14 tablet 0   bisoprolol (ZEBETA) 5 MG tablet TAKE 1 TABLET BY MOUTH EVERY DAY 90 tablet 0   busPIRone (BUSPAR) 15 MG tablet Take 15 mg by mouth 3 (three) times daily.     cetirizine (ZYRTEC) 10 MG tablet Take 1 tablet (10 mg total) by mouth daily. 30 tablet 2   dapagliflozin propanediol (FARXIGA)  10 MG TABS tablet Take 1 tablet (10 mg total) by mouth daily before breakfast. 90 tablet 3   FLUoxetine (PROZAC) 20 MG capsule Take 20 mg by mouth daily. (Patient not taking: Reported on 08/15/2021)     furosemide (LASIX) 20 MG tablet Take 2 tablets (40 mg total) by mouth every morning AND 1 tablet (20 mg total) every evening. 270 tablet 0   HYDROcodone bit-homatropine (HYCODAN) 5-1.5 MG/5ML syrup Take 5 mLs by mouth daily as needed for cough. 150 mL 0   isosorbide mononitrate (IMDUR) 30 MG 24 hr tablet TAKE 1 TABLET (30 MG TOTAL) BY MOUTH DAILY. NEED APPT FOR FUTURE REFILLS [id number] 90 tablet 3   Melatonin 10 MG TABS Take 10 mg by mouth daily as needed (sleep).     mirtazapine (REMERON) 15 MG tablet Take 15 mg by mouth at bedtime.     omeprazole  (PRILOSEC OTC) 20 MG tablet Take 20 mg by mouth daily.     oseltamivir (TAMIFLU) 75 MG capsule Take 1 capsule (75 mg total) by mouth 2 (two) times daily. 10 capsule 0   Potassium Chloride ER 20 MEQ TBCR TAKE 2 TABLETS BY MOUTH EVERY DAY 180 tablet 3   potassium chloride SA (KLOR-CON M20) 20 MEQ tablet Take 2 tablets (40 mEq total) by mouth daily. 180 tablet 3   predniSONE (DELTASONE) 10 MG tablet Take 2 tablets daily x 5 days; then 1 tablet daily x 5 days; then stop 15 tablet 0   [START ON 11/21/2021] Semaglutide-Weight Management 1 MG/0.5ML SOAJ Inject 1 mg into the skin once a week. 2 mL 0   [START ON 12/20/2021] Semaglutide-Weight Management 1.7 MG/0.75ML SOAJ Inject 1.7 mg into the skin once a week. 3 mL 0   [START ON 01/18/2022] Semaglutide-Weight Management 2.4 MG/0.75ML SOAJ Inject 2.4 mg into the skin once a week. 3 mL 11   spironolactone (ALDACTONE) 25 MG tablet Take 1 tablet (25 mg total) by mouth daily. 90 tablet 0   No current facility-administered medications on file prior to visit.    Allergies  Allergen Reactions   Other Itching and Other (See Comments)    Pet Dander   Assessment/Plan:  1. Weight loss - Patient has lost 2.2% of body weight since starting Ozempic 3 months ago. He is now on Wegovy 1 mg weekly. Will continue every 4 week titration until reaches max dose of 2.4 mg weekly to maximize weight loss.   Reminded patient of common side effects including nausea, diarrhea, dyspepsia, decreased appetite, and fatigue. Counseled patient on reducing meal size and how to titrate medication to minimize side effects. Counseled patient to call if intolerable side effects or if experiencing dehydration, abdominal pain, or dizziness. Patient will adhere to dietary modifications and will target at least 150 minutes of moderate intensity exercise weekly.   Follow up in 3 months for 6 month visit and labs. Lenna Sciara is planning to follow up with the patient in 1-2 weeks to discuss increasing  to [facility] 1.7 mg weekly. Will be due to increase to 1.7 mg on 10/23/21.   Rebbeca Paul, PharmD PGY2 Ambulatory Care Pharmacy Resident 10/09/2021 8:56 AM

## 2021-10-09 ENCOUNTER — Ambulatory Visit (INDEPENDENT_AMBULATORY_CARE_PROVIDER_SITE_OTHER): Payer: BC Managed Care – PPO | Admitting: Student-PharmD

## 2021-10-09 ENCOUNTER — Other Ambulatory Visit: Payer: Self-pay

## 2021-10-09 VITALS — BP 108/72 | HR 86 | Wt 245.6 lb

## 2021-10-09 DIAGNOSIS — E669 Obesity, unspecified: Secondary | ICD-10-CM

## 2021-10-09 NOTE — Patient Instructions (Signed)
Continue Wegovy 1 mg to complete 4 weeks. Melissa will touch base with you to discuss increasing to 1.7 mg.   Your 6 month appt is scheduled for 01/01/22. Try to come to this appt fasting because we will check your lab work.   Tips for living a healthier life    Building a Healthy and Balanced Diet Make most of your meal vegetables and fruits -  of your plate. Aim for color and variety, and remember that potatoes dont count as vegetables on the Healthy Eating Plate because of their negative impact on blood sugar.  Go for whole grains -  of your plate. Whole and intact grains--whole wheat, barley, wheat berries, quinoa, oats, brown rice, and foods made with them, such as whole wheat pasta--have a milder effect on blood sugar and insulin than white bread, white rice, and other refined grains.  Protein power -  of your plate. Fish, poultry, beans, and nuts are all healthy, versatile protein sources--they can be mixed into salads, and pair well with vegetables on a plate. Limit red meat, and avoid processed meats such as bacon and sausage.  Healthy plant oils - in moderation. Choose healthy vegetable oils like olive, canola, soy, corn, sunflower, peanut, and others, and avoid partially hydrogenated oils, which contain unhealthy trans fats. Remember that low-fat does not mean healthy.  Drink water, coffee, or tea. Skip sugary drinks, limit milk and dairy products to one to two servings per day, and limit juice to a small glass per day.  Stay active. The red figure running across the [place] is a reminder that staying active is also important in weight control.  The main message of the Healthy Eating Plate is to focus on diet quality:  The type of carbohydrate in the diet is more important than the amount of carbohydrate in the diet, because some sources of carbohydrate--like vegetables (other than potatoes), fruits, whole grains, and beans--are healthier than  others. The Healthy Eating Plate also advises consumers to avoid sugary beverages, a major source of calories--usually with little nutritional value--in the American diet. The Healthy Eating Plate encourages consumers to use healthy oils, and it does not set a maximum on the percentage of calories people should get each day from healthy sources of fat. In this way, the Healthy Eating Plate recommends the opposite of the low-fat message promoted for decades by the USDA.  [employer]kDistributor.no  SUGAR  Sugar is a huge problem in the modern day diet. Sugar is a big contributor to heart disease, diabetes, high triglyceride levels, fatty liver disease and obesity. Sugar is hidden in almost all packaged foods/beverages. Added sugar is extra sugar that is added beyond what is naturally found and has no nutritional benefit for your body. The American Heart Association recommends limiting added sugars to no more than 25g for women and 36 grams for men per day. There are many names for sugar including maltose, sucrose (names ending in "ose"), high fructose corn syrup, molasses, cane sugar, corn sweetener, raw sugar, syrup, honey or fruit juice concentrate.   One of the best ways to limit your added sugars is to stop drinking sweetened beverages such as soda, sweet tea, and fruit juice.  There is 65g of added sugars in one 20oz bottle of Coke! That is equal to 7.5 donuts.   Pay attention and read all nutrition facts labels. Below is an examples of a nutrition facts label. The #1 is showing you the total sugars where the #  2 is showing you the added sugars. This one serving has almost the max amount of added sugars per day!     20 oz Soda 65g Sugar = 7.5 Glazed Donuts  16oz Energy  Drink 54g Sugar = 6.5 Glazed Donuts  Large Sweet  Tea 38g Sugar = 4 Glazed Donuts  20oz Sports  Drink 34g Sugar = 3.5 Glazed Donuts  8oz Chocolate Milk 24g Sugar =2.5  Glazed Donuts  8oz Orange  Juice 21g Sugar = 2 Glazed Donuts  1 Juice Box 14g Sugar = 1.5 Glazed Donuts  16oz Water= NO SUGAR!!  EXERCISE  Exercise is good. Weve all heard that. In an ideal world, we would all have time and resources to get plenty of it. When you are active, your heart pumps more efficiently and you will feel better.  Multiple studies show that even walking regularly has benefits that include living a longer life. The American Heart Association recommends 150 minutes per week of exercise (30 minutes per day most days of the week). You can do this in any increment you wish. Nine or more 10-minute walks count. So does an hour-long exercise class. Break the time apart into what will work in your life. Some of the best things you can do include walking briskly, jogging, cycling or swimming laps. Not everyone is ready to exercise. Sometimes we need to start with just getting active. Here are some easy ways to be more active throughout the day:  Take the stairs instead of the elevator  Go for a 10-15 minute walk during your lunch break (find a friend to make it more enjoyable)  When shopping, park at the back of the parking lot  If you take public transportation, get off one stop early and walk the extra distance  Pace around while making phone calls  Check with your doctor if you arent sure what your limitations may be. Always remember to drink plenty of water when doing any type of exercise. Dont feel like a failure if youre not getting the 90-150 minutes per week. If you started by being a couch potato, then just a 10-minute walk each day is a huge improvement. Start with little victories and work your way up.   HEALTHY EATING TIPS  When looking to improve your eating habits, whether to lose weight, lower blood pressure or just be healthier, it helps to know what a serving size is.   Grains 1 slice of bread,  bagel,  cup pasta or rice  Vegetables 1 cup fresh or raw  vegetables,  cup cooked or canned Fruits 1 piece of medium sized fruit,  cup canned,   Meats/Proteins  cup dried       1 oz meat, 1 egg,  cup cooked beans, nuts or seeds  Dairy        Fats Individual yogurt container, 1 cup (8oz)    1 teaspoon margarine/butter or vegetable  milk or milk alternative, 1 slice of cheese          oil; 1 tablespoon mayonnaise or salad dressing                  Plan ahead: make a menu of the meals for a week then create a grocery list to go with that menu. Consider meals that easily stretch into a night of leftovers, such as stews or casseroles. Or consider making two of your favorite meal and put one in the freezer for another night. Try a night  or two each week that is meatless or no cook such as salads. When you get home from the grocery store wash and prepare your vegetables and fruits. Then when you need them they are ready to go.   Tips for going to the grocery store:  [place] store or generic brands  Check the weekly ad from your store on-line or in their in-store flyer  Look at the unit price on the shelf tag to compare/contrast the costs of different items  Buy fruits/vegetables in season  Carrots, bananas and apples are low-cost, naturally healthy items  If meats or frozen vegetables are on sale, buy some extras and put in your freezer  Limit buying prepared or ready to eat items, even if they are pre-made salads or fruit snacks  Do not shop when youre hungry  Foods at eye level tend to be more expensive. Look on the high and low shelves for deals.  Consider shopping at the farmers market for fresh foods in season.  Avoid the cookie and chip aisles (these are expensive, high in calories and low in nutritional value). Shop on the outside of the grocery store.  Healthy food preparations:  If you cant get lean hamburger, be sure to drain the fat when cooking  Steam, saut (in olive oil), grill or bake foods  Experiment with different seasonings  to avoid adding salt to your foods. Kosher salt, sea salt and Himalayan salt are all still salt and should be avoided. Try seasoning food with onion, garlic, thyme, rosemary, basil ect. Onion powder or garlic powder is ok. Avoid if it says salt (ie garlic salt).

## 2021-10-16 ENCOUNTER — Ambulatory Visit: Payer: BC Managed Care – PPO | Admitting: Internal Medicine

## 2021-10-22 ENCOUNTER — Telehealth: Payer: Self-pay | Admitting: Pharmacist

## 2021-10-22 NOTE — Telephone Encounter (Signed)
Called pt to see how he is going with [facility] 1mg  and remind patient that he is due to increase to 1.7mg  tomorrow.  LVM for patient to call back.

## 2021-10-22 NOTE — Telephone Encounter (Signed)
Spoke with patient. He has a little bit of nausea, but ready to increase to 1.7mg  weekly. Waiting for it to be ready at the pharmacy. Doing pulmonary rehab, not much activity outside of that. Working on portion control and eating more vegetables.

## 2021-10-23 IMAGING — CT CT CHEST W/ CM
2 of 3 series · 15 of 36 positions shown, 18 images · IV contrast (omnipaque)
Comparison: April 21, 2019

CLINICAL DATA: Headache and abnormal chest x-ray.

EXAM:
CT CHEST WITH CONTRAST
TECHNIQUE: Multidetector CT imaging of the chest was performed during
intravenous contrast administration.
CONTRAST:  75mL OMNIPAQUE IOHEXOL 300 MG/ML  SOLN

[Series 3: chest with 2mm st · axial · 0.98mm/px · z∈[+930,+1286]mm · 12 of 210 slices shown, 15 images]
[im 16/210  mediastinal]
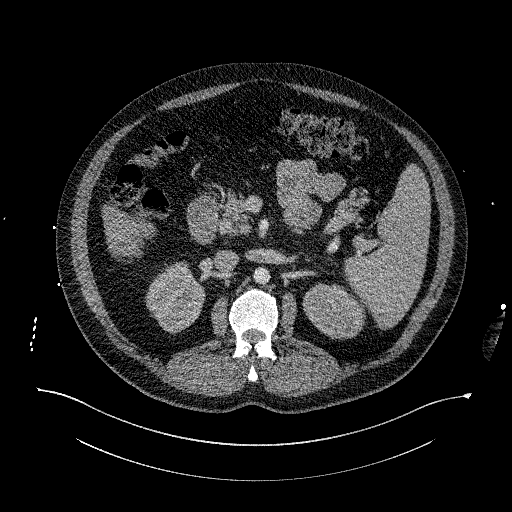
[im 16/210  lung]
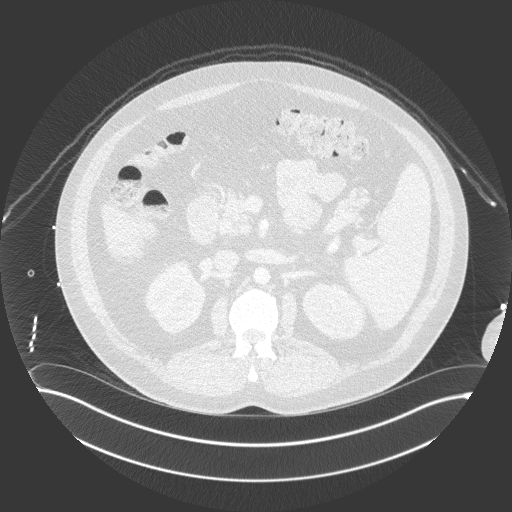
[im 31/210  lung]
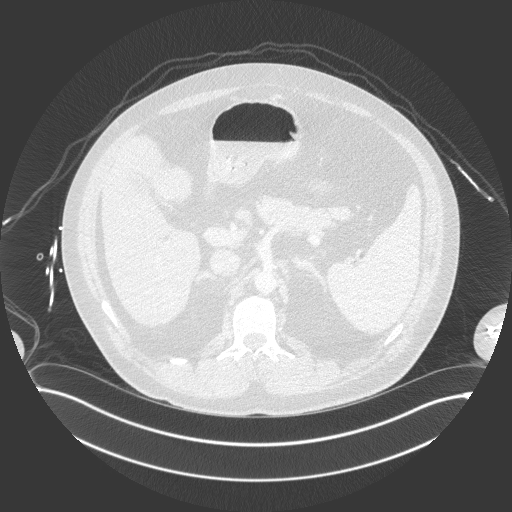
[im 47/210  lung]
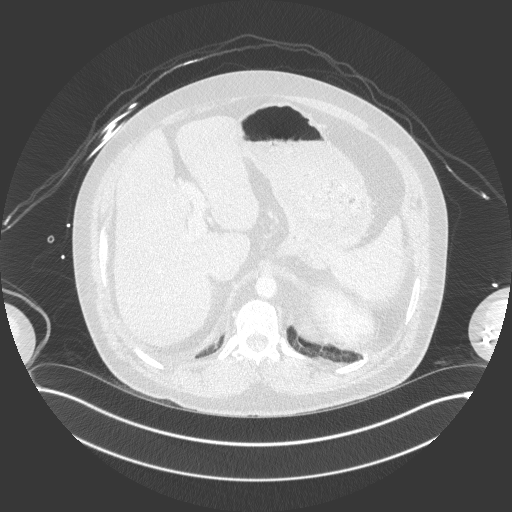
[im 62/210  lung]
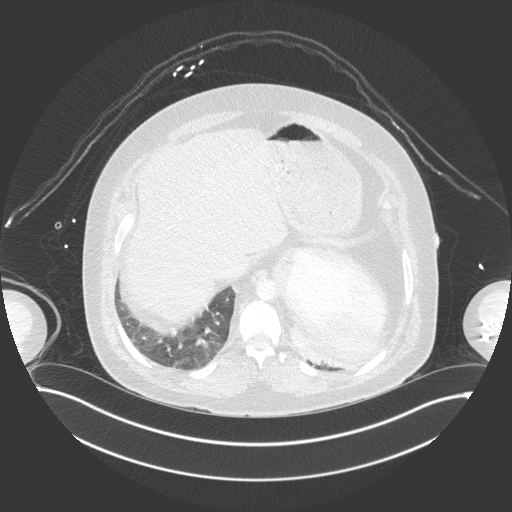
[im 78/210  mediastinal]
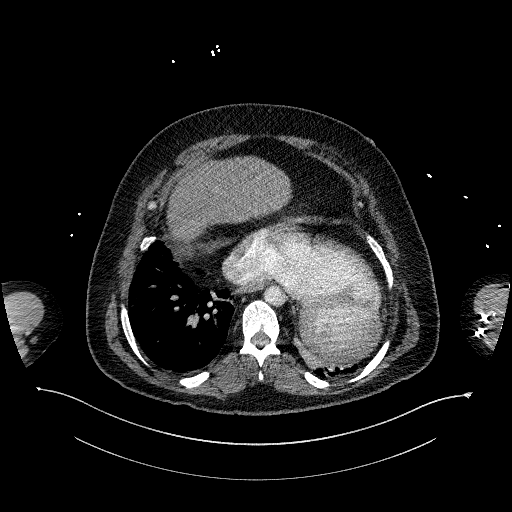
[im 78/210  lung]
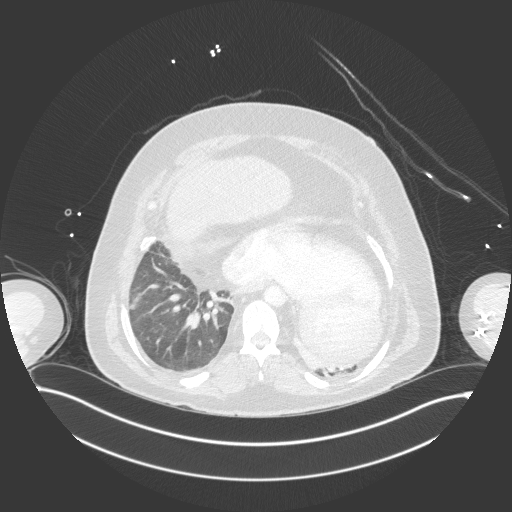
[im 93/210  lung]
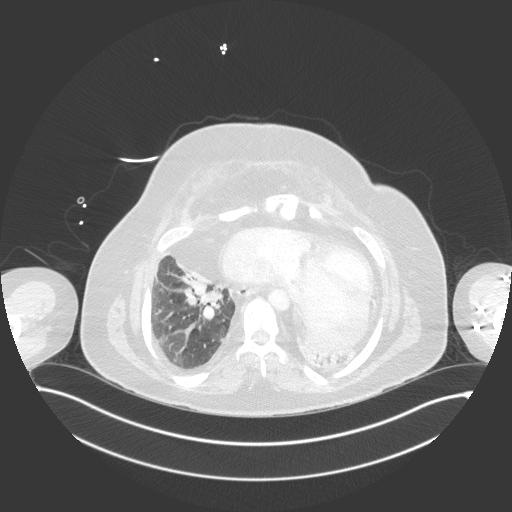
[im 117/210  lung]
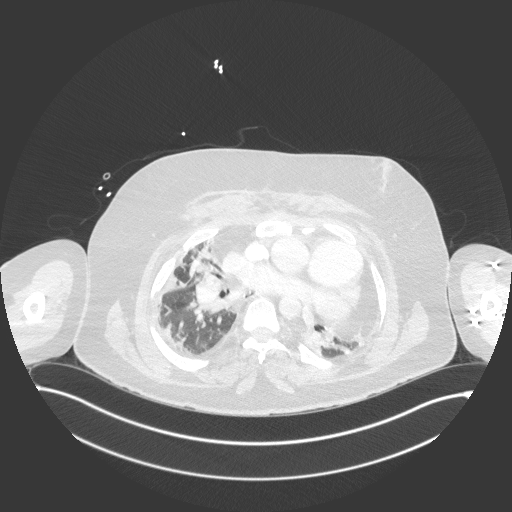
[im 132/210  lung]
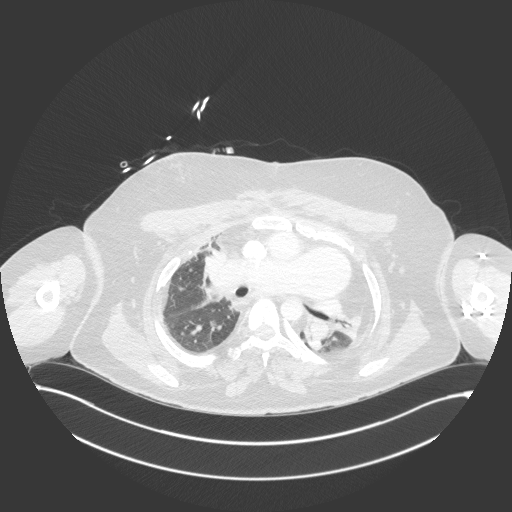
[im 148/210  mediastinal]
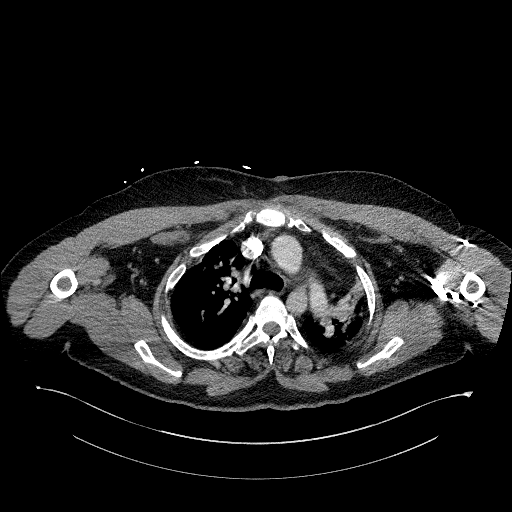
[im 148/210  lung]
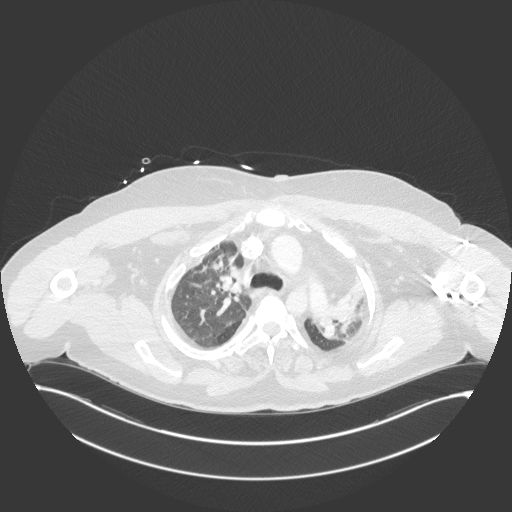
[im 163/210  lung]
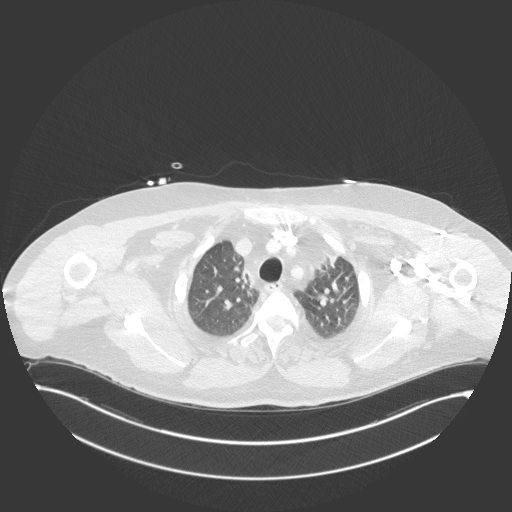
[im 179/210  lung]
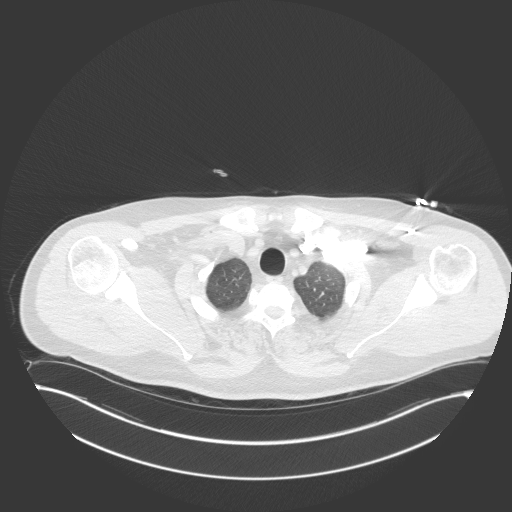
[im 194/210  lung]
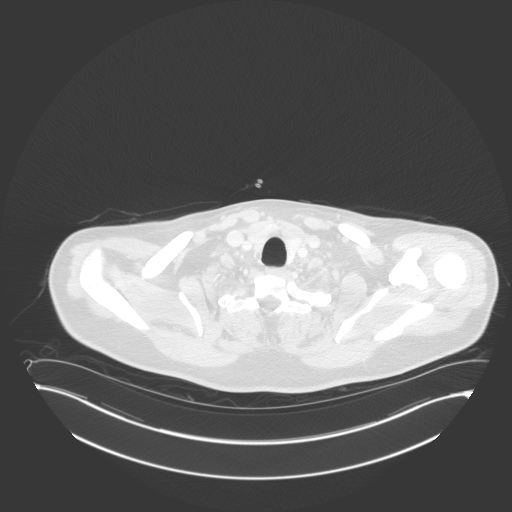

[Series 6: chest with 2mm st cor · coronal · 0.82mm/px · 3 of 181 slices shown]
[im 37/181  lung]
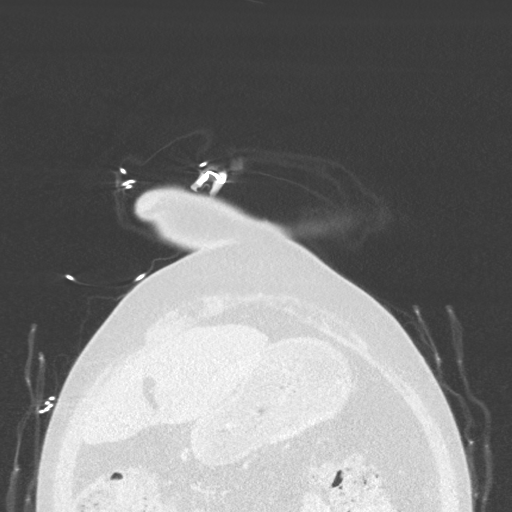
[im 73/181  lung]
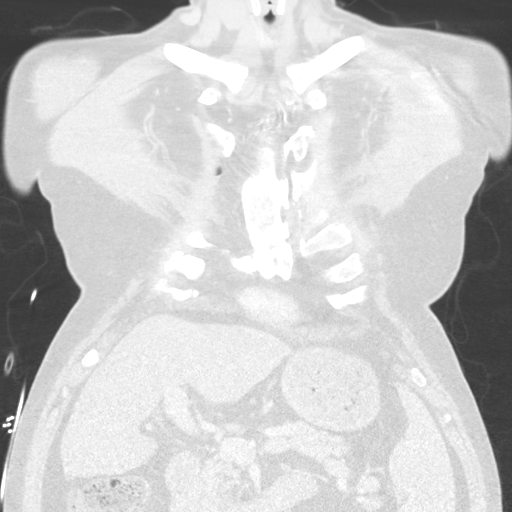
[im 109/181  lung]
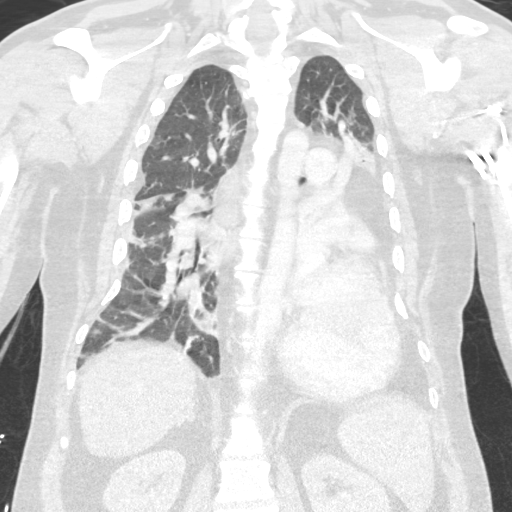

[15 of 36 positions shown; findings below may reference images not displayed]

FINDINGS: Cardiovascular: There is stable dilatation of the main pulmonary
artery (5.1 cm). The pulmonary arteries are limited in evaluation
secondary to suboptimal opacification with intravenous contrast. No
intraluminal filling defects are identified. There is marked
severity cardiomegaly. Mass effect from an existing pectus deformity
is again noted. No pericardial effusion.

Mediastinum/Nodes: No enlarged mediastinal, hilar, or axillary lymph
nodes. Thyroid gland, trachea, and esophagus demonstrate no
significant findings.

Lungs/Pleura: Stable moderate to marked severity areas of
consolidation and/or atelectasis are seen within the right middle
lobe, right lower lobe and throughout the left lung. Associated
bronchiectasis is again seen with stable left-sided volume loss.

A stable 7 mm noncalcified lung nodule is seen within the lateral
aspect of the right lower lobe.

There is no evidence of a pleural effusion or pneumothorax.

Upper Abdomen: No acute abnormality.

Musculoskeletal: A pectus deformity of the anterior chest wall is
seen.
IMPRESSION: 1. Stable moderate to marked severity areas of bilateral
consolidation and atelectasis, which may represent sequelae
associated with a chronic infectious or chronic inflammatory
process.
2. Stable 7 mm noncalcified right lower lobe lung nodule.
Non-contrast chest CT at 6-12 months is recommended. If the nodule
is stable at time of repeat CT, then future CT at 18-24 months (from
today's scan) is considered optional for low-risk patients, but is
recommended for high-risk patients. This recommendation follows the
consensus statement: Guidelines for Management of Incidental
Pulmonary Nodules Detected on CT Images: From the [HOSPITAL]
3. Pectus excavatum deformity with subsequent mass effect on the
heart.
4. Stable central pulmonary arterial enlargement consistent with
pulmonary arterial hypertension.

## 2021-10-25 NOTE — Telephone Encounter (Signed)
Patient called stating the 1.7 is causing his a lot of gas pain, hasn't had a BM since tues AM. Started 1.7mg  on Tuesday. Burp and feels better than comes back. Recommended trying simethicone and tums/alka seltzer to help, miralax to get bowels moving. If pain gets severe go to hospital Patient plans to continue with therapy.

## 2021-11-02 ENCOUNTER — Other Ambulatory Visit: Payer: Self-pay

## 2021-11-02 ENCOUNTER — Encounter (HOSPITAL_COMMUNITY): Payer: Self-pay | Admitting: Cardiology

## 2021-11-02 ENCOUNTER — Ambulatory Visit (HOSPITAL_COMMUNITY)
Admission: RE | Admit: 2021-11-02 | Discharge: 2021-11-02 | Disposition: A | Discharge status: Home / Self Care | Payer: BC Managed Care – PPO | Source: Ambulatory Visit | Attending: Cardiology | Admitting: Cardiology

## 2021-11-02 VITALS — BP 112/70 | HR 86 | Wt 240.8 lb

## 2021-11-02 DIAGNOSIS — Z79899 Other long term (current) drug therapy: Secondary | ICD-10-CM | POA: Diagnosis not present

## 2021-11-02 DIAGNOSIS — Z8249 Family history of ischemic heart disease and other diseases of the circulatory system: Secondary | ICD-10-CM | POA: Diagnosis not present

## 2021-11-02 DIAGNOSIS — J9611 Chronic respiratory failure with hypoxia: Secondary | ICD-10-CM | POA: Diagnosis not present

## 2021-11-02 DIAGNOSIS — I50812 Chronic right heart failure: Secondary | ICD-10-CM | POA: Diagnosis not present

## 2021-11-02 DIAGNOSIS — Z7984 Long term (current) use of oral hypoglycemic drugs: Secondary | ICD-10-CM | POA: Insufficient documentation

## 2021-11-02 DIAGNOSIS — G4736 Sleep related hypoventilation in conditions classified elsewhere: Secondary | ICD-10-CM | POA: Diagnosis not present

## 2021-11-02 DIAGNOSIS — Z7985 Long-term (current) use of injectable non-insulin antidiabetic drugs: Secondary | ICD-10-CM | POA: Diagnosis not present

## 2021-11-02 DIAGNOSIS — J9612 Chronic respiratory failure with hypercapnia: Secondary | ICD-10-CM | POA: Diagnosis not present

## 2021-11-02 DIAGNOSIS — J45909 Unspecified asthma, uncomplicated: Secondary | ICD-10-CM | POA: Insufficient documentation

## 2021-11-02 DIAGNOSIS — Q676 Pectus excavatum: Secondary | ICD-10-CM | POA: Diagnosis not present

## 2021-11-02 DIAGNOSIS — I2729 Other secondary pulmonary hypertension: Secondary | ICD-10-CM | POA: Insufficient documentation

## 2021-11-02 DIAGNOSIS — Z87891 Personal history of nicotine dependence: Secondary | ICD-10-CM | POA: Insufficient documentation

## 2021-11-02 DIAGNOSIS — Z9981 Dependence on supplemental oxygen: Secondary | ICD-10-CM | POA: Insufficient documentation

## 2021-11-02 DIAGNOSIS — J99 Respiratory disorders in diseases classified elsewhere: Secondary | ICD-10-CM | POA: Diagnosis not present

## 2021-11-02 DIAGNOSIS — I5032 Chronic diastolic (congestive) heart failure: Secondary | ICD-10-CM | POA: Diagnosis present

## 2021-11-02 DIAGNOSIS — I272 Pulmonary hypertension, unspecified: Secondary | ICD-10-CM

## 2021-11-02 LAB — BASIC METABOLIC PANEL
Anion gap: 9 (ref 5–15)
BUN: 13 mg/dL (ref 6–20)
CO2: 34 mmol/L — ABNORMAL HIGH (ref 22–32)
Calcium: 8.5 mg/dL — ABNORMAL LOW (ref 8.9–10.3)
Chloride: 95 mmol/L — ABNORMAL LOW (ref 98–111)
Creatinine, Ser: 0.86 mg/dL (ref 0.61–1.24)
GFR, Estimated: >60 mL/min (ref >60)
Glucose, Bld: 91 mg/dL (ref 70–99)
Potassium: 3.7 mmol/L (ref 3.5–5.1)
Sodium: 138 mmol/L (ref 135–145)

## 2021-11-02 LAB — BRAIN NATRIURETIC PEPTIDE: B Natriuretic Peptide: 8.9 pg/mL (ref 0.0–100.0)

## 2021-11-02 NOTE — Patient Instructions (Signed)
Thank you for your visit today.  Labs done today, your results will be available in MyChart, we will contact you for abnormal readings.  You provider is referring you to the [facility], they will contact you to arrange your appointment.  Your physician recommends that you schedule a follow-up appointment in: 6 months (August 20023) ** please call the office in June to arrange your follow up appointment **  If you have any questions or concerns before your next appointment please send [place] a message through [place] or call our office at [phone].    TO LEAVE A MESSAGE FOR THE NURSE SELECT OPTION 2, PLEASE LEAVE A MESSAGE INCLUDING: YOUR NAME DATE OF BIRTH CALL BACK NUMBER REASON FOR CALL**this is important as we prioritize the call backs  YOU WILL RECEIVE A CALL BACK THE SAME DAY AS LONG AS YOU CALL BEFORE 4:00 PM  At the [facility], you and your health needs are our priority. As part of our continuing mission to provide you with exceptional heart care, we have created designated Provider Care Teams. These Care Teams include your primary Cardiologist (physician) and Advanced Practice Providers (APPs- Physician Assistants and Nurse Practitioners) who all work together to provide you with the care you need, when you need it.   You may see any of the following providers on your designated Care Team at your next follow up: Dr Glori Bickers Dr Haynes Kerns, NP [name], [place] [facility] [place], [place] Audry Riles, PharmD   Please be sure to bring in all your medications bottles to every appointment.

## 2021-11-03 NOTE — Progress Notes (Signed)
Patient ID: [name], male   DOB: 05-23-1981, 41 y.o.   MRN: [id number] PCP: Dr. Tollie Pizza Cardiology: Dr. Aundra Dubin  41 y.o. with history of pectus excavatum s/p repair in childhood and redo pectus repair in 6/11 returns to cardiology clinic for followup of RV failure. He has chronic hypercarbic and and nocturnal hypoxemic respiratory failure.  He has been wearing Bipap at night.  Oxygen saturation drops with heavy exercise, so he uses oxygen when he will be exercising.  Echo 5/16 showed normal LV EF but did not show the RV well.  I had him get a cardiac MRI in 6/16 for better view of the RV.  This showed moderate to severe RV dilation with mild to moderate RV systolic dysfunction, RV EF 37%.  This looked very similar to the 2010 MRI.   In 8/20, he was admitted to the hospital in [place] with acute hypercarbic respiratory failure.  He was intubated, eventually able to extubate. He was extensively diuresed.  Cardiac MRI showed markedly dilated RV with moderately decreased systolic function, D-shaped septum, and left-ward shift of the heart.  The left atrium was compressed between the ascending and descending aorta.  RHC showed elevated right and left heart filling pressures.  Echo bubble study was negative. Aspiration PNA was also a concern at this admission.    He had redo pectus surgery 08/10/19 in [place] with Dr. [name].    Echo 4/21 showed EF 55%, compressed LA, normal RV size with mildly decreased systolic function, D-shaped septum (mildly), CVP estimate 8, unable to estimate PASP.  Echo in 6/22 showed EF 50-55%, mildly dilated RV with normal systolic function, unable to estimate PASP.  Abdominal US in 6/22 showed normal-appearing liver, no ascites.   He continues with pulmonary rehab in [place].  Using oxygen at all times (4L [place]), Bipap at night. He started on semaglutide and has lost about 14 lbs.  Breathing is a bit better with weight loss, he is able to walk on flat ground with his  oxygen without dyspnea.  Short of breath with inclines and if he walks fast.  No orthopnea/PND.  No chest pain.  No lightheadedness.    Labs (8/10): BNP 9, K 3.6, creatinine 0.7  Labs (2/14): K 3.8, creatinine 0.8, BNP not elevated Labs (2/16): K 3.4, creatinine 0.81 Labs (6/16): K 4, creatinine 0.73, BNP 12 Labs (11/18): K 4.1, creatinine 0.82, pro-BNP 6, hgb 15.2 Labs (7/20): pro-BNP 62, K 5, creatinine 0.85  Labs (8/20): K 4.6, creatinine 0.69 Labs (9/20): ABG 7.35/59/87  Labs (11/20): K 3.4, creatinine 0.75 Labs (1/21): BNP 23, K 4.2, creatinine 0.7 Labs (2/21): ABG 7.33/81/76 Labs (7/21): K 4.1, creatinine 0.89 Labs (8/21): K 4.2, creatinine 0.69, BNP 37 Labs (8/22): K 4.2, creatinine 0.8, hgb 12.9   Allergies (verified):  No Known Drug Allergies   Past Medical History:  1. Pectus excavatum status post repair in [place] in childhood and repeat repair at [facility] in 6/11. Borderline Marfanoid habitus.  - Pectus redo repair in [place] by Dr. [name] in 12/20.  2. Asthma.  3. CHF: Patient was admitted in 6/10 with CHF and volume overload. He was diuresed with IV lasix and felt better. Echo suggested that the RV was enlarged. RHC was done (after several doses of IV lasix) showing mean RA 13, RV 38/12, PA 42/23 mean 34, mean PCWP 15. PVR < 2 WU. Shunt run showed no O2 step up. Patient was hypoxic and was started O2. Cardiac MRI (6/10)  showed normal LV size and systolic function EF 35%, D-shaped IV septum, moderate to severe RV dilation with RV EF 37%. The pulmonic valve opened normally. The heart was shifted leftward (pectus) with compression of the LA between the ascending and descending aorta. MRA of the chest showed normal pulmonary veins. CHF was primarily right-sided with mild pulmonary HTN noted. He is hypoxic, especially at night, so hypoxic pulmonary vasoconstriction could be playing a role. PCWP was only mildly elevated but I wonder if LA compression could be playing a role. Repeat  echo after pectus surgery in 11/11 showed normal LV systolic function and size with a D-shaped interventricular septum. The LA was still compressed between ascending and descending aorta. The RV was moderately dilated with mild systolic dysfunction.  Echo (5/16) with EF 5-60%, aortic root 4.0 cm, RV poorly vsiualized.  Cardiac MRI (6/16) with pectus excavatum, left-shifted heart, LA compressed between ascending and descending aorta, LV EF 58% with D-shaped interventricular septum suggestive of RV pressure/volume overload, moderate to severe RV dilation with mild to moderate RV systolic dysfunction (RV EF 37%), dilated PA (this MRI is very similar to prior in 2010).  - RHC (8/20): mean RA 11, PA 51/30 mean 41, mean PCWP 18, PAPI 1.9, CVP/PCWP 0.61 - Limited echo for bubble study (8/20): Negative bubble study.  - Cardiac MRI (8/20): The left lung appears collapsed, compressed by leftwards shift of the heart and dilated RV.  Dilated main pulmonary artery. Normal LV size with mild diffuse hypokinesis, EF 52%. Markedly dilated RV with EF 31%, moderate hypokinesis. D-shaped interventricular septum, suggestive of RV pressure/volume overload. Small left atrium appears compressed between the aortic root/ascending aorta and the descending aorta/ventrebral column. Nonspecific LGE pattern at the RV insertion sites, suggestive of RV pressure/volume overload. - Echo (4/21): EF 55%, compressed LA, normal RV size with mildly decreased systolic function, D-shaped septum (mildly), CVP estimate 8, unable to estimate PASP.  - Echo (6/22): EF 50-55%, mildly dilated RV with normal systolic function, unable to estimate PASP.  4. 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  - High resolution CT chest (8/19): Bilateral pleuroparenchymal scarring, cirrhosis noted. No ILD.  - CT chest (8/20): Severe pectus deformity, heart shifted left with extensive left lung compressive atelectasis.  5.  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 Patient is on Bipap.  6. ?Cirrhosis: Noted on RUQ [place] in 8/20.  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  - MRI liver in 10/20 showed hepatic hemangioma and lobulated liver contours concerning for cardiogenic hepatic congestion, no frank cirrhosis. - Abdominal US (6/22): No ascites, liver appeared normal.  7. COVID-19 in 1/22  Family History:  HTN-Mother   Social History:  The patient lives in [place] with his wife and son.  Patient currently out of work (prior in [employer]) => working towards disability.  He drinks 1-6 drinks per week, never having more than 3 drinks in 1 evening. No illicit drug use.  Patient states former smoker. Started at age 54. only smoked socially. Quit smoking at age 57.   Review of Systems  All systems reviewed and negative except as per HPI.  Current Outpatient Medications  Medication Sig Dispense Refill   acetaminophen (TYLENOL) 500 MG tablet Take 1,000 mg by mouth every 6 (six) hours as needed for mild pain.     albuterol (PROAIR HFA) 108 (90 Base) MCG/ACT inhaler Inhale 2 puffs into the lungs every 6 (six) hours as needed for wheezing or shortness of breath.  8.5 each 6   bisoprolol (ZEBETA) 5 MG tablet TAKE 1 TABLET BY MOUTH EVERY DAY 90 tablet 0   busPIRone (BUSPAR) 15 MG tablet Take 15 mg by mouth 2 (two) times daily.     cetirizine (ZYRTEC) 10 MG tablet Take 1 tablet (10 mg total) by mouth daily. 30 tablet 2   dapagliflozin propanediol (FARXIGA) 10 MG TABS tablet Take 1 tablet (10 mg total) by mouth daily before breakfast. 90 tablet 3   furosemide (LASIX) 20 MG tablet Take 2 tablets (40 mg total) by mouth every morning AND 1 tablet (20 mg total) every evening. 270 tablet 0   HYDROcodone bit-homatropine (HYCODAN) 5-1.5 MG/5ML syrup Take 5 mLs by mouth daily as needed for cough. 150 mL 0   isosorbide mononitrate (IMDUR) 30 MG 24 hr tablet TAKE 1 TABLET (30 MG TOTAL) BY MOUTH DAILY. NEED APPT FOR FUTURE  REFILLS [phone] 90 tablet 3   Melatonin 10 MG TABS Take 10 mg by mouth daily as needed (sleep).     mirtazapine (REMERON) 15 MG tablet Take 15 mg by mouth at bedtime.     omeprazole (PRILOSEC OTC) 20 MG tablet Take 20 mg by mouth daily.     Potassium Chloride ER 20 MEQ TBCR TAKE 2 TABLETS BY MOUTH EVERY DAY 180 tablet 3   [START ON 12/20/2021] Semaglutide-Weight Management 1.7 MG/0.75ML SOAJ Inject 1.7 mg into the skin once a week. 3 mL 0   spironolactone (ALDACTONE) 25 MG tablet Take 1 tablet (25 mg total) by mouth daily. 90 tablet 0   [START ON 01/18/2022] Semaglutide-Weight Management 2.4 MG/0.75ML SOAJ Inject 2.4 mg into the skin once a week. (Patient not taking: Reported on 11/02/2021) 3 mL 11   No current facility-administered medications for this encounter.    BP 112/70    Pulse 86    Wt 109.2 kg (240 lb 12.8 oz)    SpO2 96% Comment: 4 l N/C   BMI 29.31 kg/m  General: NAD Neck: No JVD, no thyromegaly or thyroid nodule.  Lungs: Clear to auscultation bilaterally with normal respiratory effort. CV: Nondisplaced PMI.  Heart regular S1/S2, no S3/S4, no murmur.  No peripheral edema.  No carotid bruit.  Normal pedal pulses.  Abdomen: Soft, nontender, no hepatosplenomegaly, no distention.  Skin: Intact without lesions or rashes.  Neurologic: Alert and oriented x 3.  Psych: Normal affect. Extremities: No clubbing or cyanosis.  HEENT: Normal.  MSK: Pectus excavatum  Assessment/Plan: 1. Chronic hypercarbic/hypoxemic respiratory failure:  Last CTA chest showed collapsed left lung with compression from dilated RV, no PE, RLL infiltrate (aspiration versus PNA).  This appears to be primarily a mechanical problem with severe pectus and leftward shift of the heart with compression of the left lung.  He has now had a redo pectus repair (12/20) in [place] by Dr. [name].  He does not seem any better post-surgery, possibly worse.  He is on 4L home oxygen during the day, Bipap at night.  -  Continue home oxygen and Bipap.  - He is now doing pulmonary rehab in [place].   - Continue to work on weight loss as this could help diaphragmatic excursion.    - ?Any other options for treatment, will make referral to [facility] for evaluation.  2. Chronic diastolic CHF with RV failure:  Patient presented with primarily right-sided CHF in 2010, likely due to pulmonary hypertension from a combination of left atrial compression and nocturnal hypoxemia. He is now using Bipap every night. He had  a redo pectus surgery in 2011. I suspect the surgery relieved some of the compression on his lungs but followup cardiac MRI in 6/16 did show continued compression of the left atrium between the ascending and descending aorta as well as a dilated RV with RV EF 37%.  Cardiac MRI in 6/16 was very similar to the MRI in 2010. RHC in 8/20 showed elevated right and left heart filling pressures with primarily pulmonary venous hypertension.  PAPI low but not markedly low, suggesting mild-moderate RV dysfunction. The MRI repeated in 8/20 showed left-shifted heart with marked RV enlargement and RVEF 31%, LV EF 52%.  The LA is compressed between ascending and descending aorta, which may explain elevated PCWP.  Echo in 4/21 showed EF 55% with compressed LA, D-shaped septum, normal RV size with mildly decreased systolic function. Echo in 6/22 with EF 50-55%, mildly dilated RV with normal systolic function, unable to estimate PASP.  NYHA class III symptoms likely pulmonary-related for the most part.  He is not volume overloaded on current Lasix dose.   - Continue Lasix 40 qam/20 qpm and KCl 40 daily.  BMET/BNP today.    - Continue spironolactone 25 mg daily.  - Continue Farxiga 10 mg daily.   - Continue Bipap at night and oxygen during the day to lower drive for hypoxemic pulmonary vasoconstriction.     2. 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  RHC in 8/20 moderate pulmonary hypertension, PA pressure 51/30. Significant component of pulmonary venous hypertension.  There is unlikely to be a group 1 component to his PH, so unlikely that pulmonary vasodilators would be helpful.   3. Restrictive lung disease:  Lung disease from restriction due to pectus. He has had chronic hypercarbic respiratory failure. This is a major player in his chronic dyspnea. He has now had 3 operations for his pectus deformity. CT in 8/20 showed heart shifted left with compression of left lung. Most recent operation in [place] in 12/20 by Dr. [name].  He has not had significant improvement since surgery.  - Continue followup with Dr. [name].   - Continue efforts at weight loss with semaglutide, he is down 14 lbs.   - Refer for evaluation at the [facility].  4. Asthma: Chronic.   5. Cirrhosis: Abdominal US was concerning for cirrhosis.  He is not a heavy drinker.  My suspicion is that this may be related to RV failure. MRI liver in 10/20 showed hepatic hemangioma and lobulated liver contours concerning for cardiogenic hepatic congestion, no frank cirrhosis.  Abdominal US in 6/22 showed no ascites, normal-appearing liver.   Followup 6 months.    Loralie Champagne 11/03/2021

## 2021-11-05 ENCOUNTER — Telehealth (HOSPITAL_COMMUNITY): Payer: Self-pay

## 2021-11-05 NOTE — Telephone Encounter (Signed)
Faxed referral to the [facility] per Dr. Aundra Dubin request.

## 2021-11-13 MED ORDER — POTASSIUM CHLORIDE ER 20 MEQ PO TBCR: EXTENDED_RELEASE_TABLET | ORAL | 3 refills | Status: AC

## 2021-11-13 NOTE — Addendum Note (Signed)
Addended by: Marcelle Overlie D on: 11/13/2021 02:41 PM ? ? Modules accepted: Orders ? ?

## 2021-11-13 NOTE — Telephone Encounter (Signed)
Patient states he is doing better on Wegovy 1.7. Is ready to go up to 2.'4mg'$ .  ?We will see him in clinic 4/25 for his 6 month follow up. ?

## 2021-11-23 ENCOUNTER — Other Ambulatory Visit (HOSPITAL_COMMUNITY): Payer: Self-pay | Admitting: Cardiology

## 2021-12-26 ENCOUNTER — Telehealth: Payer: Self-pay | Admitting: Pharmacist

## 2021-12-26 NOTE — Telephone Encounter (Signed)
Pt called clinic asking about Wegovy refill. He states he no longer has the [place] insurance because he changed over to Medicare. He does not have a drug plan currently but will need to sign up for a Medicare plan. Advised him that Medicare plans do not cover Wegovy and that they would not cover other GLPs without a DM diagnosis (he does not have DM). Canceled 6 month Wegovy appt that was scheduled for next week. He will let us know when he does get prescription insurance again so we can double check on GLP coverage but he is aware there likely will not be any with his new plan. Weight had improved to 235 lbs at home. ?

## 2022-01-01 ENCOUNTER — Ambulatory Visit: Payer: BC Managed Care – PPO

## 2022-01-23 ENCOUNTER — Telehealth: Payer: Self-pay | Admitting: Primary Care

## 2022-01-23 NOTE — Telephone Encounter (Signed)
Lmtcb for pt.  

## 2022-01-23 NOTE — Telephone Encounter (Signed)
ATC patient, LMTCB 

## 2022-01-23 NOTE — Telephone Encounter (Signed)
Pt sent MyChart message to front desk pool. Pt is complaining of sinus pressue and headaches from the pressure. Wanted an appt with Beth and nothing available with her until 5/23. Pt did not want to wait that long. Would like anitbiotics called in. Please advise.  ?

## 2022-01-24 NOTE — Telephone Encounter (Signed)
Attempted to call pt but unable to reach. Left message for him to return call. Due to multiple attempts with trying to reach pt and unable to do so, per protocol encounter will be closed.

## 2022-01-29 ENCOUNTER — Ambulatory Visit: Payer: Self-pay | Admitting: Primary Care

## 2022-02-12 ENCOUNTER — Encounter: Payer: Self-pay | Admitting: Adult Health

## 2022-02-12 ENCOUNTER — Ambulatory Visit: Payer: Medicare PPO | Admitting: Adult Health

## 2022-02-12 ENCOUNTER — Ambulatory Visit (INDEPENDENT_AMBULATORY_CARE_PROVIDER_SITE_OTHER): Payer: Medicare PPO

## 2022-02-12 VITALS — BP 132/72 | HR 82 | Temp 98.6°F | Ht 76.0 in | Wt 247.0 lb

## 2022-02-12 DIAGNOSIS — J4540 Moderate persistent asthma, uncomplicated: Secondary | ICD-10-CM | POA: Diagnosis not present

## 2022-02-12 DIAGNOSIS — J984 Other disorders of lung: Secondary | ICD-10-CM

## 2022-02-12 DIAGNOSIS — J208 Acute bronchitis due to other specified organisms: Secondary | ICD-10-CM

## 2022-02-12 DIAGNOSIS — J9611 Chronic respiratory failure with hypoxia: Secondary | ICD-10-CM

## 2022-02-12 DIAGNOSIS — R0602 Shortness of breath: Secondary | ICD-10-CM | POA: Diagnosis not present

## 2022-02-12 DIAGNOSIS — J9612 Chronic respiratory failure with hypercapnia: Secondary | ICD-10-CM

## 2022-02-12 DIAGNOSIS — I5032 Chronic diastolic (congestive) heart failure: Secondary | ICD-10-CM

## 2022-02-12 LAB — BASIC METABOLIC PANEL
BUN: 12 mg/dL (ref 6–23)
CO2: 48 mEq/L — ABNORMAL HIGH (ref 19–32)
Calcium: 9.2 mg/dL (ref 8.4–10.5)
Chloride: 92 mEq/L — ABNORMAL LOW (ref 96–112)
Creatinine, Ser: 0.7 mg/dL (ref 0.40–1.50)
GFR: 114.06 mL/min (ref >60.00)
Glucose, Bld: 94 mg/dL (ref 70–99)
Potassium: 4.3 mEq/L (ref 3.5–5.1)
Sodium: 143 mEq/L (ref 135–145)

## 2022-02-12 LAB — BRAIN NATRIURETIC PEPTIDE: Pro B Natriuretic peptide (BNP): 10 pg/mL (ref 0.0–100.0)

## 2022-02-12 MED ORDER — ALBUTEROL SULFATE (2.5 MG/3ML) 0.083% IN NEBU: 2.5000 mg | INHALATION_SOLUTION | Freq: Four times a day (QID) | RESPIRATORY_TRACT | 5 refills | Status: AC | PRN

## 2022-02-12 MED ORDER — PREDNISONE 20 MG PO TABS: 20.0000 mg | ORAL_TABLET | Freq: Every day | ORAL | 0 refills | Status: DC

## 2022-02-12 NOTE — Assessment & Plan Note (Signed)
Slow to resolve asthmatic bronchitic exacerbation.  Hold on additional antibiotics this time.  Check chest x-ray today.  We will give a second course of steroids.  Patient is to increase his mucociliary clearance.  May use albuterol nebulizer as needed.  Plan  Patient Instructions  Chest xray today.  Prednisone '20mg'$  daily for 5 days  Albuterol inhaler or neb As needed   Continue on Lasix and aldactone  Continue on TRILOGY vent At bedtime  and naps. And as needed.  Continue on Oxygen 5l/m  Follow up with Dr. [name] in 6-8 weeks and As needed   Please contact office for sooner follow up if symptoms do not improve or worsen or seek emergency care

## 2022-02-12 NOTE — Assessment & Plan Note (Signed)
Continue on oxygen and nocturnal trilogy.  Advised may use trilogy device during daytime if napping

## 2022-02-12 NOTE — Progress Notes (Signed)
$'@Patient'U$  ID: [name], male    DOB: [birth year], 41 y.o.   MRN: [id number]  Chief Complaint  Patient presents with   Acute Visit    Referring provider: [name], PA-C  HPI: 41 year old male followed for restrictive lung disease secondary to severe pectus excavatum, chronic hypercarbic respiratory failure and asthma.  Patient remains on continuous oxygen 5 L and nocturnal trilogy ventilator at bedtime Patient is underwent pectus reconstruction  x2.  Medical history significant for diastolic heart failure   TEST/EVENTS :  2D echo February 28, 2021 showed EF at 50 to 55%, RV SF is normal, RV size is mildly enlarged.,  Right atrium mildly and left largest  PFTs December 2018 FEV1 15%, ratio 71, FVC 60%, no significant bronchodilator response, DLCO 54%  CT chest April 03, 2021 showed stable moderate to marked severity of bilateral consolidation atelectasis, 7 mm right lower lobe nodule mild pectus excavatum deformity with subsequent mass effect on the heart  FENO 18ppb   CT chest 12/11/2021 care everywhere, proximal left main bronchus severely compressed by diffusely enlarged pulmonary artery and overall pectus physiology, solid pulmonary nodules are stable measuring 7 mm in the right lower lobe, lower lung predominant mild interstitial edema,.  2D echo December 11, 2021 shows EF of 55%, normal right ventricular chamber size, normal systolic function.  02/12/2022 Acute OV : Cough  Presents for an acute office visit.  He complains over the last 2 weeks he has had increased cough, congestion, shortness of breath, and sinus pain and pressure.  Patient was seen by primary care and given a 10-day course of Augmentin and prednisone taper for suspected sinus infection.  Patient says his congestion did improve.  Has no longer discolored mucus everything is clear to white.  However he continues to have some cough increased shortness of breath.  Does feel that he is more winded than usual.   Also more fatigued.  With no energy.  He denies any hemoptysis, chest pain, orthopnea, leg swelling.  He remains on oxygen 5 L.  Denies any increased oxygen demands.  Remains on trilogy vent at bedtime  Has been seen at the [facility] with Dr. Jilda Roche for his severe restrictive lung disease.   Allergies  Allergen Reactions   Other Itching and Other (See Comments)    Pet Dander    Immunization History  Administered Date(s) Administered   Influenza Split 06/16/2012, 06/27/2012, 06/08/2013, 06/09/2014, 06/28/2014, 08/10/2015, 05/28/2018, 05/11/2019   Influenza Whole 06/09/2010, 06/10/2011   Influenza,inj,Quad PF,6+ Mos 06/08/2013, 08/10/2015, 05/28/2018, 05/11/2019   Influenza,inj,Quad PF,6-35 Mos 05/29/2020   Influenza,inj,quad, With Preservative 06/08/2013, 08/10/2015   Influenza-Unspecified 06/09/2014, 05/22/2020   Moderna Sars-Covid-2 Vaccination 11/26/2019, 12/27/2019   Tdap 09/10/2009    Past Medical History:  Diagnosis Date   Asthma    CHF (congestive heart failure) (HCC)    Chronic hypercapnic respiratory failure (HCC)    nocturnal  hypoxemic resp failure, on bipap   Pectus excavatum    Restrictive lung disease     Tobacco History: Social History   Tobacco Use  Smoking Status Former   Packs/day: 0.10   Years: 1.00   Pack years: 0.10   Types: Cigarettes   Quit date: 09/09/2006   Years since quitting: 15.4  Smokeless Tobacco Never   Counseling given: Not Answered   Outpatient Medications Prior to Visit  Medication Sig Dispense Refill   acetaminophen (TYLENOL) 500 MG tablet Take 1,000 mg by mouth every 6 (six) hours as needed for  mild pain.     albuterol (PROAIR HFA) 108 (90 Base) MCG/ACT inhaler Inhale 2 puffs into the lungs every 6 (six) hours as needed for wheezing or shortness of breath. 8.5 each 6   busPIRone (BUSPAR) 15 MG tablet Take 15 mg by mouth 2 (two) times daily.     cetirizine (ZYRTEC) 10 MG tablet Take 1 tablet (10 mg total) by mouth daily. 30  tablet 2   FARXIGA 10 MG TABS tablet TAKE 1 TABLET BY MOUTH DAILY BEFORE BREAKFAST. 90 tablet 3   furosemide (LASIX) 20 MG tablet Take 2 tablets (40 mg total) by mouth every morning AND 1 tablet (20 mg total) every evening. 270 tablet 0   isosorbide mononitrate (IMDUR) 30 MG 24 hr tablet TAKE 1 TABLET (30 MG TOTAL) BY MOUTH DAILY. NEED APPT FOR FUTURE REFILLS [phone] 90 tablet 3   Melatonin 10 MG TABS Take 10 mg by mouth daily as needed (sleep).     mirtazapine (REMERON) 15 MG tablet Take 15 mg by mouth at bedtime.     omeprazole (PRILOSEC OTC) 20 MG tablet Take 20 mg by mouth daily.     Potassium Chloride ER 20 MEQ TBCR TAKE 2 TABLETS BY MOUTH EVERY DAY 180 tablet 3   spironolactone (ALDACTONE) 25 MG tablet Take 1 tablet (25 mg total) by mouth daily. 90 tablet 0   bisoprolol (ZEBETA) 5 MG tablet TAKE 1 TABLET BY MOUTH EVERY DAY (Patient not taking: Reported on 02/12/2022) 90 tablet 0   HYDROcodone bit-homatropine (HYCODAN) 5-1.5 MG/5ML syrup Take 5 mLs by mouth daily as needed for cough. 150 mL 0   No facility-administered medications prior to visit.     Review of Systems:   Constitutional:   No  weight loss, night sweats,  Fevers, chills,  +fatigue, or  lassitude.  HEENT:   No headaches,  Difficulty swallowing,  Tooth/dental problems, or  Sore throat,                No sneezing, itching, ear ache, + nasal congestion, post nasal drip,   CV:  No chest pain,  Orthopnea, PND, swelling in lower extremities, anasarca, dizziness, palpitations, syncope.   GI  No heartburn, indigestion, abdominal pain, nausea, vomiting, diarrhea, change in bowel habits, loss of appetite, bloody stools.   Resp:   No chest wall deformity  Skin: no rash or lesions.  GU: no dysuria, change in color of urine, no urgency or frequency.  No flank pain, no hematuria   MS:  No joint pain or swelling.  No decreased range of motion.  No back pain.    Physical Exam  BP 132/72 (BP Location: Left Arm, Patient  Position: Sitting, Cuff Size: Normal)   Pulse 82   Temp 98.6 F (37 C) (Oral)   Ht '6\' 4"'$  (1.93 m)   Wt 247 lb (112 kg)   SpO2 99%   BMI 30.07 kg/m   GEN: A/Ox3; pleasant , NAD, well nourished    HEENT:  [place]/AT,   NOSE-clear drainage , THROAT-clear, no lesions, no postnasal drip or exudate noted.   NECK:  Supple w/ fair ROM; no JVD; normal carotid impulses w/o bruits; no thyromegaly or nodules palpated; no lymphadenopathy.    RESP  Clear  P & A; w/o, wheezes/ rales/ or rhonchi. no accessory muscle use, no dullness to percussion  CARD:  RRR, no m/r/g, tr  peripheral edema, pulses intact, no cyanosis or clubbing.  GI:   Soft & nt; nml bowel sounds; no  organomegaly or masses detected.   Musco: Warm bil, no deformities or joint swelling noted.   Neuro: alert, no focal deficits noted.    Skin: Warm, no lesions or rashes    Lab Results:      BNP   ProBNP   Imaging: No results found.       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  Hold on additional antibiotics this time.  Check chest x-ray today.  We will give a second course of steroids.  Patient is to increase his mucociliary clearance.  May use albuterol nebulizer as needed.  Plan  Patient Instructions  Chest xray today.  Prednisone '20mg'$  daily for 5 days  Albuterol inhaler or neb As needed   Continue  on Lasix and aldactone  Continue on TRILOGY vent At bedtime  and naps. And as needed.  Continue on Oxygen 5l/m  Follow up with Dr. [name] in 6-8 weeks and As needed   Please contact office for sooner follow up if symptoms do not improve or worsen or seek emergency care       Restrictive lung disease Continue on oxygen and nocturnal trilogy.  Advised may use trilogy device during daytime if napping   Chronic respiratory failure with hypoxia and hypercapnia (HCC) Continue on oxygen to maintain O2 saturations greater than 88 to 90%.   Plan  . Patient Instructions  Chest xray today.  Prednisone '20mg'$  daily for 5 days  Albuterol inhaler or neb As needed   Continue on Lasix and aldactone  Continue on TRILOGY vent At bedtime  and naps. And as needed.  Continue on Oxygen 5l/m  Follow up with Dr. [name] in 6-8 weeks and As needed   Please contact office for sooner follow up if symptoms do not improve or worsen or seek emergency care       Chronic diastolic CHF (congestive heart failure) ([place]) Appears compensated without evidence of volume overload.  Continue on current regimen continue follow-up with cardiology    I spent   41 minutes dedicated to the care of this patient on the date of this encounter to include pre-visit review of records, face-to-face time with the patient discussing conditions above, post visit ordering of testing, clinical documentation with the electronic health record, making appropriate referrals as documented, and communicating necessary findings to members of the patients care team.   Rexene Edison, NP 02/12/2022

## 2022-02-12 NOTE — Patient Instructions (Addendum)
Chest xray today.  Prednisone '20mg'$  daily for 5 days  Albuterol inhaler or neb As needed   Continue on Lasix and aldactone  Continue on TRILOGY vent At bedtime  and naps. And as needed.  Continue on Oxygen 5l/m  Follow up with Dr. [name] in 6-8 weeks and As needed   Please contact office for sooner follow up if symptoms do not improve or worsen or seek emergency care

## 2022-02-12 NOTE — Assessment & Plan Note (Signed)
Continue on oxygen to maintain O2 saturations greater than 88 to 90%.   Plan  . Patient Instructions  Chest xray today.  Prednisone '20mg'$  daily for 5 days  Albuterol inhaler or neb As needed   Continue on Lasix and aldactone  Continue on TRILOGY vent At bedtime  and naps. And as needed.  Continue on Oxygen 5l/m  Follow up with Dr. [name] in 6-8 weeks and As needed   Please contact office for sooner follow up if symptoms do not improve or worsen or seek emergency care

## 2022-02-12 NOTE — Assessment & Plan Note (Signed)
Appears compensated without evidence of volume overload.  Continue on current regimen continue follow-up with cardiology

## 2022-02-16 ENCOUNTER — Other Ambulatory Visit (HOSPITAL_COMMUNITY): Payer: Self-pay | Admitting: Cardiology

## 2022-02-16 DIAGNOSIS — I50812 Chronic right heart failure: Secondary | ICD-10-CM

## 2022-02-16 DIAGNOSIS — I272 Pulmonary hypertension, unspecified: Secondary | ICD-10-CM

## 2022-02-16 DIAGNOSIS — J9611 Chronic respiratory failure with hypoxia: Secondary | ICD-10-CM

## 2022-02-20 ENCOUNTER — Telehealth: Payer: Self-pay | Admitting: Adult Health

## 2022-02-22 ENCOUNTER — Telehealth: Payer: Self-pay | Admitting: *Deleted

## 2022-02-22 NOTE — Telephone Encounter (Signed)
Called [employer] and left my number with Asencion Partridge to get some information on what is needed and required for patient in regards to vent.   [employer] is stating he was denied. Asencion Partridge was on lunch and Celesta Aver is stating I need to speak with her.   Will await call from [place].

## 2022-02-22 NOTE — Telephone Encounter (Signed)
Called and spoke with patient regarding his Trilogy vent (NIV),he was contacted by [employer] and told that insurance did not want to pay for the Trilogy. I advised him that we were communicating with [employer] to find out what documentation we need to keep the vent.  Advised we would need a download, he stated they were just out at his home and got the download and he would send [place] the information via mychart.  Advised him we would be in touch regarding the outcome.  He verbalized understanding.

## 2022-02-22 NOTE — Telephone Encounter (Signed)
Called and spoke with Asencion Partridge from [place] and it was noted from [place] that per [place] new policies that they are denying everyone home health ventilator. Asencion Partridge stated that patient has to try Bipap and have proof of failed treatment.   Can we try to do a peer to peer on this choice. Patient has been on vent for years.   Can you ladies please check on this please  Thank you

## 2022-02-25 NOTE — Telephone Encounter (Signed)
We do not do peer to peers on DME equipment the authorizations all go through the dme companies

## 2022-02-25 NOTE — Telephone Encounter (Signed)
Called and spoke with Asencion Partridge from [place] and it was noted from [place] that per [place] new policies that they are denying everyone home health ventilator. Asencion Partridge stated that patient has to try Bipap and have proof of failed treatment. 04/13/2021 with Beth 04/27/2019 phone message saying patient was being discharged home and needed a Trilogy machine for home.  Joen Laura

## 2022-03-08 NOTE — Telephone Encounter (Signed)
Still waiting on a response from MR. Please advise.

## 2022-03-11 ENCOUNTER — Telehealth: Payer: Self-pay | Admitting: Internal Medicine

## 2022-03-13 ENCOUNTER — Other Ambulatory Visit (HOSPITAL_COMMUNITY): Payer: Self-pay | Admitting: Cardiology

## 2022-03-13 DIAGNOSIS — I509 Heart failure, unspecified: Secondary | ICD-10-CM

## 2022-03-13 NOTE — Telephone Encounter (Signed)
It has been a while since I saw him.  In addition time he has been seeing him.  Probably a good idea to at least do a video visit. First avail

## 2022-03-13 NOTE — Telephone Encounter (Signed)
Spoke with [employer] who states ventilator order has been denied r/t lack of reason stated in last OV as to why it is needed. Spoke with [employer] who states they have been trying to reach pt to f/u with appeal but have not been able to get into contact with pt. [employer] stated if pt wants to appeal more then likely an OV will be needed to talk about need for vent use. Spoke with pt who is questioning if OV is needed and weather a Bi-Pap will "do the job". Dr. [name] please advise as to weather pt needs to have OV to talk about concerns. Thank you

## 2022-03-14 NOTE — Telephone Encounter (Signed)
ATC x1.  LVM to return call. 

## 2022-03-14 NOTE — Telephone Encounter (Signed)
See other phone messag sent 03/13/22

## 2022-03-14 NOTE — Telephone Encounter (Signed)
Tried calling the pt and there was no answer and no option to leave msg. Will call back. Pt is scheduled for appt already on 03/26/22.

## 2022-03-14 NOTE — Telephone Encounter (Signed)
Patient has been scheduled to see Dr. [name]  Next Appt With Pulmonology Brand Males, MD) 03/26/2022 at 9:45 AM

## 2022-03-26 ENCOUNTER — Ambulatory Visit (INDEPENDENT_AMBULATORY_CARE_PROVIDER_SITE_OTHER): Payer: Medicare PPO | Admitting: Internal Medicine

## 2022-03-26 ENCOUNTER — Encounter: Payer: Self-pay | Admitting: Internal Medicine

## 2022-03-26 VITALS — BP 128/70 | HR 84 | Temp 98.4°F | Ht 76.0 in | Wt 253.6 lb

## 2022-03-26 DIAGNOSIS — J984 Other disorders of lung: Secondary | ICD-10-CM

## 2022-03-26 DIAGNOSIS — J9611 Chronic respiratory failure with hypoxia: Secondary | ICD-10-CM

## 2022-03-26 DIAGNOSIS — Q676 Pectus excavatum: Secondary | ICD-10-CM

## 2022-03-26 DIAGNOSIS — J988 Other specified respiratory disorders: Secondary | ICD-10-CM | POA: Diagnosis not present

## 2022-03-26 DIAGNOSIS — J9612 Chronic respiratory failure with hypercapnia: Secondary | ICD-10-CM

## 2022-03-26 NOTE — Progress Notes (Signed)
OV 05/25/2019  Subjective:  Patient ID: [name], male , DOB: 26-Jul-1981 , age 41 y.o. , MRN: [id number] , ADDRESS: [place] [address]   05/25/2019 -   Chief Complaint  Patient presents with   Restrictive Lung Disease    Feels a little better since last visit.     ICD-10-CM   1. Chronic respiratory failure with hypoxia and hypercapnia (HCC)  J96.11    J96.12   2. Pectus excavatum  Q67.6      HPI [name] 41 y.o. -presents for follow-up of the above issues.  He was hospitalized approximately just over a month ago with significant acute on chronic hypercapnic and hypoxemic respiratory failure.  PCO2 was greater than 100 at that time.  He ultimately got discharged.  In between he saw my nurse practitioner and his PCO2 had come down to 59.  He is on trilogy ventilator at night along with 3 L of oxygen at night.  He is also on 1-2 L of oxygen in the daytime.  He has quit working.  He might be applying for disability him not so sure.  Is up-to-date with his vaccination status.  We have been looking at connecting him with a pectus excavatum redo surgeon.  A month ago I spoke to the surgeon Dr. Hinton Lovely at [facility].  She does not do anymore redo surgeries.  She referred [place] to Dr. Linna Hoff in [place].  Nurse practitioner has made a referral but neither as an outpatient have heard from him.  We also sent his CD-ROM of the CT scan to Dr. Threasa Alpha at [facility] in [place].  Waiting to hear.  Patient is interested in a referral to the [facility] clinic because his family in the [place] area.  I have contacted Dr. West Carbo at the [facility] clinic to inquire about potential surgeon      OV 07/30/2019  Subjective:  Patient ID: [name], male , DOB: 04-17-1981 , age 41 y.o. , MRN: [id number] , ADDRESS: [place] [address]   07/30/2019 -   Chief Complaint  Patient presents with   Follow-up    Pt states he still has problems  with his breathing. Denies any problems with coughing. Pt is on oxygen and goes between 2-3L   Follow-up chronic hypoxemic and hypercapnic respiratory failure from severe pectus excavatum  HPI [name] 41 y.o. -last seen by myself mid September 2020.  In the interim he was able to establish contact with Dr. [name] surgeon in [place].  He saw them probably this month 2020.  He says he is being scheduled for surgery early December 2020.  He says he was extremely pleased with the surgeon's visit.  Redo surgery by placing bars and opening up 6 inches of space inside his thoracic cavity has been discussed.  He says he does not want to see another institution like [place] [facility] in [place] or [facility].  He is hopeful that he will have a good outcome after the surgery.  He says the surgery might be delayed because of the COVID-19 pandemic.  He is frustrated by this.  Overall he feels his health is stable.  He has gained some weight after stopping the Adderall.  His Lasix has been increased recently and this is helped his dyspnea.  He is on 3 L of oxygen and Trilegy ventilator at night.  He does not feel he  is hypercapnic.  He does not think he needs a blood gas.  But his symptom scores are worse compared to September 2020.   Venous CO2 was suggest that when the CO2 in the venous system is over 48 correlates with hypercapnia.  Currently it is 58 and still is a bit higher than before but it could also reflect metabolic alkalosis from Lasix.         Results for Brian, [name] (MRN [id number]) as of 05/25/2019 09:48  Ref. Range 04/25/2019 07:19 04/26/2019 05:26 04/26/2019 08:05 04/27/2019 08:35 05/14/2019 14:54  pH, Arterial Latest Ref Range: 7.350 - 7.450  7.193 (LL) 7.181 (LL) 7.231 (L) 7.316 (L) 7.351  pCO2 arterial Latest Ref Range: 32.0 - 48.0 mmHg 110 (HH) 119 (HH) 103 (HH) 88.1 (HH) 59.1 (H)  pO2, Arterial Latest Ref Range: 83.0 - 108.0 mmHg 74.2 (L) 85.7 91.4 95.3 86.8    Results for Brian, [name] (MRN [id number]) as of 07/30/2019 10:38  Ref. Range 06/14/2019 15:44 06/24/2019 10:51 06/28/2019 09:51 07/22/2019 11:43 07/22/2019 12:26  CO2 Latest Ref Range: 22 - 32 mmol/L 35 (H) 35 (H)   37 (H)    OV 10/14/2019  Subjective:  Patient ID: [name], male , DOB: 11-Jul-1981 , age 41 y.o. , MRN: [id number] , ADDRESS: [place] [address]   10/14/2019 -  follolwup pectus and chronic hypoxemic hypercapnic respiratory failure  HPI [name] 41 y.o. -is a follow-up visit after undergoing redo pectus repair surgery in [place].  He says he had PLA bars and increased chest volume surgery was done with a redo repair.  He showed me his fresh scar.  It is healing well.  He had a video visit with the surgeon who is somewhat optimistic according to his history that he might be weaned off of oxygen and several months.  However at this point in time 1-2  month after the surgery he feels no subjective improvement.  He still on oxygen using trilogy ventilator.  He says his ECOG is now 3.  His dyspnea is class III.  He is not able to work.  He is asking for permanent disability application.  I agree with this.  He has not had a Covid vaccine asked him to apply for this through the state.  He had a chest x-ray done at [facility] in January 2021: I only reviewed the report.  He had an access to the image on his cell phone and he showed me the image which I visualized and agree with the formal interpretation and my own interpretation as well basically there is no change he is.  He might have increased chest volume.  There is generalized congestion.  Reviewed and interpreted recent blood work October 08, 2019: Creatinine 0.7 mg percent.  Carbon dioxide 37 which appears to be his baseline  His baseline blood gas shows CO2 between 60 and 88.    His most recent BNP was 23.     ROS - per HPI     has a past medical history of Asthma, CHF  (congestive heart failure) ([place]), Chronic hypercapnic respiratory failure ([place]), Pectus excavatum, and Restrictive lung disease.   OV 09/26/2020  Subjective:  Patient ID: [name], male , DOB: [birth year] , age 41 y.o. , MRN: [id number] , ADDRESS: [address] [place] [address] PCP [name], PA-C Patient Care Team: [name], PA-C as PCP - General (Family Medicine)  Larey Dresser, MD as Consulting Physician (Cardiology) Clance, Armando Reichert, MD as Consulting Physician (Pulmonary Disease)  This Provider for this visit: Treatment Team:  Attending Provider: Brand Males, MD    09/26/2020 -   Chief Complaint  Patient presents with   Follow-up    Still staying SOB with exertion   Follow-up chronic hypoxemic and hypercapnic respiratory failure with pectus excavatum  HPI [name] 41 y.o. -presents for follow-up.  Is almost a year since I last saw him.  In the interim he saw a nurse practitioner in November 2021.  In December 2020 when he had COVID despite being vaccinated with 2 doses of mRNA vaccine.  We are presuming this was OMICRON.  He got better fast.  He uses the same oxygen at rest with exam and exertion.  He also uses his trilogy ventilator at night.  Overall he feels stable.  His symptom score is stable.  He did see cardiologist Dr. Loralie Champagne in August 2021.  He was referred to weight loss program.  He wants to be referred because since then he has gained more weight.  Apparently initially in the summer 2020 when he did not qualify.  He continues to pulmonary rehabilitation.  His last blood gas was nearly a year ago.  He is willing to get this repeated.  Of note his surgeon in [place] Dr. Sabra Heck has moved to August.  He is trying to get himself established with a surgeon in August.   CT Chest data  No results found.   04/13/2021- Interim hx  Patient presents today for hospital follow-up. He was admitted from 04/03/21-04/07/21 for  acute respiratory failure with hypoxia and hypercapnia d/t probable pneumonia in the setting of restrictive lung disease and chronic diastolic heart failure. He was initially treated with BIPAP and started on empiric antibiotics. Weaned to baseline 5L and transitioned back to oral antibiotics.   He is doing well today, states that his breathing is better. He has chronic chest discomfort/chest tightness. He is using Trilogy at night as directed, wearing on average 8-10 hours a night. Baseline O2 5L pulsed. He would like to switch rehab programs, he is current attending [facility]. He is currently on disability. Completed additional paperwork today. Denies fever, chills, confusion, cough, wheezing, N/V/D.    OV 05/28/2021  Subjective:  Patient ID: [name], male , DOB: Jul 16, 1981 , age 45 y.o. , MRN: [id number] , ADDRESS: [place] [address] PCP [name], PA-C Patient Care Team: [name], PA-C as PCP - General (Family Medicine) Larey Dresser, MD as Consulting Physician (Cardiology) Clance, Armando Reichert, MD as Consulting Physician (Pulmonary Disease)  This Provider for this visit: Treatment Team:  Attending Provider: Brand Males, MD    05/28/2021 -   Chief Complaint  Patient presents with   Follow-up    Pt states that he has been doing okay since his last hospital stay.     Follow-up chronic hypoxemic and hypercapnic respiratory failure with pectus excavatum   HPI [name] 41 y.o. -admitted July 2022. Now subjectively baseline. 5L [place]. Uses BiPAP /trilogy at night. BMI is 30 . HE feels visceral obesity impacting respiration. CXR bilateral pneumonia I nJuly. Personally visualized. Asking for dietary advice. We did discuss tracheostomy as last resort if things got bad      02/12/2022 Acute OV : Cough  41 year old male followed for restrictive lung disease secondary to severe pectus excavatum,  chronic  hypercarbic respiratory failure and asthma.  Patient remains on continuous oxygen 5 L and nocturnal trilogy ventilator at bedtime Patient is underwent pectus reconstruction  x2.  Medical history significant for diastolic heart failure   TEST/EVENTS :  2D echo February 28, 2021 showed EF at 50 to 55%, RV SF is normal, RV size is mildly enlarged.,  Right atrium mildly and left largest  PFTs December 2018 FEV1 15%, ratio 71, FVC 60%, no significant bronchodilator response, DLCO 54%  CT chest April 03, 2021 showed stable moderate to marked severity of bilateral consolidation atelectasis, 7 mm right lower lobe nodule mild pectus excavatum deformity with subsequent mass effect on the heart  FENO 18ppb   CT chest 12/11/2021 care everywhere, proximal left main bronchus severely compressed by diffusely enlarged pulmonary artery and overall pectus physiology, solid pulmonary nodules are stable measuring 7 mm in the right lower lobe, lower lung predominant mild interstitial edema,.  2D echo December 11, 2021 shows EF of 55%, normal right ventricular chamber size, normal systolic function.   Presents for an acute office visit.  He complains over the last 2 weeks he has had increased cough, congestion, shortness of breath, and sinus pain and pressure.  Patient was seen by primary care and given a 10-day course of Augmentin and prednisone taper for suspected sinus infection.  Patient says his congestion did improve.  Has no longer discolored mucus everything is clear to white.  However he continues to have some cough increased shortness of breath.  Does feel that he is more winded than usual.  Also more fatigued.  With no energy.  He denies any hemoptysis, chest pain, orthopnea, leg swelling.  He remains on oxygen 5 L.  Denies any increased oxygen demands.  Remains on trilogy vent at bedtime  OV 03/26/2022  Subjective:  Patient ID: [name], male , DOB: [birth year] , age 85 y.o. , MRN: [id number] , ADDRESS:  [place] [address] PCP [name], PA-C Patient Care Team: [name], PA-C as PCP - General (Family Medicine) Larey Dresser, MD as Consulting Physician (Cardiology) Clance, Armando Reichert, MD as Consulting Physician (Pulmonary Disease)  This Provider for this visit: Treatment Team:  Attending Provider: Brand Males, MD    03/26/2022 -   Chief Complaint  Patient presents with   Follow-up    Follow up for chronic resp failure with hypoxia. Pt states that he is doing well since last visit with TP. Pt states his sinus infection is better. Pt is still on 5L of oxygen. Pt finished course of prednisone from TP. Needs to talk about vent with [employer]    Chronic hypoxemic and hypercapnic respiratory failure with cor pulmonale secondary to pectus excavatum secondary to restrictive chest disease.  Failed surgery.  Left main bronchial occlusion   [name] -returns for follow-up.  It has been a while since I personally saw him.  In between he is seen the nurse practitioner.  He is also seen [facility].  The left bronchial airway is completely obstructed this because of extrinsic compression.  They contemplated stent but they abandoned it because of high risk of surgery.  He has further follow-up with them.  He also is seeing multidisciplinary cardiac and pulmonary teams there.  He has a virtual visit with him today.  Overall he says he is stable but it is obvious he is slowly deteriorating.  He sleeps 12 hours a day.  In the  daytime he is fatigued.  His ECOG is 3 he is mostly sedentary he does some ADLs like dishes and changes his clothes.  He continues on his noninvasive positive pressure ventilation.  He is no longer the trilogy machine but some of the machine.  But now insurance company wants to take him off that and just give him BiPAP.  He is frustrated by this.  When we instituted noninvasive positive pressure ventilation that is when his PCO2 went down  below 100 and he stopped getting repeated admissions.  But apparently the insurance company wants to get him off this and put him on BiPAP.  We are going to try to appeal and they wanted a face-to-face visit.  He is also beginning to develop pedal edema he says.  He has temporary extension of the noninvasive positive pressure ventilation through September 2023  Last blood gas and pulmonary function test was a few years ago.   SYMPTOM SCALE - 05/25/2019  07/30/2019  09/26/2020  03/26/2022   O2 use  2-3L at rest and 3-4 with exertion and TRiology qhs Same o2 and triology use. Has gained weight 5-7 L O2 use and night NIPPV  Shortness of Breath 0 -> 5 scale with 5 being worst (score 6 If unable to do)     At rest 0 '2 2 1  '$ Simple tasks - showers, clothes change, eating, shaving 0 '3 3 2  '$ Household (dishes, doing bed, laundry) '3 4 4 4  '$ Shopping '1 4 3 4  '$ Walking level at own pace '2 3 3 4  '$ Walking up Stairs '4 5 4 5  '$ Total (40 - 48) Dyspnea Score '10 21 20 19  '$ How bad is your cough? 0 n/a 0 0  How bad is your fatigue 0 '3 2 3  '$ nausea   0 0  vomit   0 0  diarrhea   0 0  anxiety   2 2  depression   2 1     Latest Reference Range & Units 02/15/09 09:32 04/21/19 19:50 04/22/19 12:47 04/24/19 11:40 04/25/19 07:19 04/26/19 05:26 04/26/19 08:05 04/27/19 08:35 05/14/19 14:54 08/03/19 12:55 10/25/19 14:35  pH, Arterial 7.350 - 7.450  7.460 (H) 7.315 (L) 7.348 (L) 7.228 (L) 7.193 (LL) 7.181 (LL) 7.231 (L) 7.316 (L) 7.351 7.289 (L) 7.328 (L)  pCO2 arterial 32.0 - 48.0 mmHg 58.4 (HH) 110 (HH) 84.6 (HH) 105 (HH) 110 (HH) 119 (HH) 103 (HH) 88.1 (HH) 59.1 (H) 85.0 (HH) 81.2 (HH)  pO2, Arterial 83.0 - 108.0 mmHg 51.0 (L) 95.5 75.4 (L) 112 (H) 74.2 (L) 85.7 91.4 95.3 86.8 116 (H) 76.3 (L)      Latest Reference Range & Units 02/13/09 14:45 02/14/09 04:25 02/14/09 20:42 02/24/09 00:00 05/10/09 09:30 11/06/12 16:13 10/27/14 12:08 03/07/15 12:28 10/05/15 16:27 10/27/15 12:30 07/29/17 17:09 03/22/19 10:12 04/21/19  02:35 04/22/19 03:37 04/22/19 14:15 04/23/19 02:47 04/24/19 01:57 04/25/19 02:38 04/26/19 02:28 04/27/19 05:22 06/14/19 15:44 06/24/19 10:51 07/22/19 12:26 07/30/19 10:54 10/08/19 12:54 12/14/19 12:23 03/14/20 12:42 04/18/20 12:44 02/14/21 11:51 02/28/21 11:48 04/03/21 12:20 04/04/21 03:36 04/05/21 12:08 04/06/21 02:29 04/07/21 00:40 06/29/21 12:05 11/02/21 12:08 02/12/22 16:06  CO2 19 - 32 mEq/L 35 (H) 39 (H) 40 (H) 35 (H) 35 (H) 42 (H) 39 (H) 35 (H) 30 29 45 (H) 51 (H) 45 (H) 43 (H) 42 (H) 40 (H) 41 (H) 35 (H) 38 (H) 38 (H) 35 (H) 35 (H) 37 (H) 46 (H) 37 (H) 43 (H) 44 (H) 42 (H) 43 (H) 42 (  H) 42 (H) 43 (H) 45 (H) 47 (H) 45 (H) 42 (H) 34 (H) 48 (H)  (H): Data is abnormally high     PFT     Latest Ref Rng & Units 08/21/2017    4:03 PM  PFT Results  FVC-Pre L 1.02   FVC-Predicted Pre % 16   FVC-Post L 1.05   FVC-Predicted Post % 16   Pre FEV1/FVC % % 72   Post FEV1/FCV % % 71   FEV1-Pre L 0.73   FEV1-Predicted Pre % 14   FEV1-Post L 0.74   DLCO uncorrected ml/min/mmHg 21.18   DLCO UNC% % 54   DLCO corrected ml/min/mmHg 21.75   DLCO COR %Predicted % 55   DLVA Predicted % 163   TLC L 2.81   TLC % Predicted % 35   RV % Predicted % 83        has a past medical history of Asthma, CHF (congestive heart failure) (HCC), Chronic hypercapnic respiratory failure (HCC), Pectus excavatum, and Restrictive lung disease.   reports that he quit smoking about 15 years ago. His smoking use included cigarettes. He has a 0.10 pack-year smoking history. He has never used smokeless tobacco.  Past Surgical History:  Procedure Laterality Date   KNEE SURGERY     right   PECTUS EXCAVATUM REPAIR     RIGHT HEART CATH N/A 04/21/2019   Procedure: RIGHT HEART CATH;  Surgeon: Larey Dresser, MD;  Location: [facility];  Service: Cardiovascular;  Laterality: N/A;    Allergies  Allergen Reactions   Other Itching and Other (See Comments)    Pet Dander    Immunization History  Administered  Date(s) Administered   Influenza Split 06/16/2012, 06/27/2012, 06/08/2013, 06/09/2014, 06/28/2014, 08/10/2015, 05/28/2018, 05/11/2019   Influenza Whole 06/09/2010, 06/10/2011   Influenza,inj,Quad PF,6+ Mos 06/08/2013, 08/10/2015, 05/28/2018, 05/11/2019   Influenza,inj,Quad PF,6-35 Mos 05/29/2020   Influenza,inj,quad, With Preservative 06/08/2013, 08/10/2015   Influenza-Unspecified 06/16/2012, 06/08/2013, 06/09/2014, 08/10/2015, 05/28/2018, 05/11/2019, 05/22/2020   Moderna Sars-Covid-2 Vaccination 11/26/2019, 12/27/2019   Tdap 09/10/2009    Family History  Problem Relation Age of Onset   Hypertension Mother    Heart attack Neg Hx    Stroke Neg Hx      Current Outpatient Medications:    acetaminophen (TYLENOL) 500 MG tablet, Take 1,000 mg by mouth every 6 (six) hours as needed for mild pain., Disp: , Rfl:    albuterol (PROAIR HFA) 108 (90 Base) MCG/ACT inhaler, Inhale 2 puffs into the lungs every 6 (six) hours as needed for wheezing or shortness of breath., Disp: 8.5 each, Rfl: 6   albuterol (PROVENTIL) (2.5 MG/3ML) 0.083% nebulizer solution, Take 3 mLs (2.5 mg total) by nebulization every 6 (six) hours as needed for wheezing or shortness of breath., Disp: 75 mL, Rfl: 5   bisoprolol (ZEBETA) 5 MG tablet, TAKE 1 TABLET BY MOUTH EVERY DAY, Disp: 90 tablet, Rfl: 3   busPIRone (BUSPAR) 15 MG tablet, Take 15 mg by mouth 2 (two) times daily., Disp: , Rfl:    cetirizine (ZYRTEC) 10 MG tablet, Take 1 tablet (10 mg total) by mouth daily., Disp: 30 tablet, Rfl: 2   furosemide (LASIX) 20 MG tablet, TAKE 2 TABLETS (40 MG TOTAL) BY MOUTH EVERY MORNING AND 1 TABLET (20 MG TOTAL) EVERY EVENING., Disp: 270 tablet, Rfl: 0   isosorbide mononitrate (IMDUR) 30 MG 24 hr tablet, TAKE 1 TABLET (30 MG TOTAL) BY MOUTH DAILY. NEED APPT FOR FUTURE REFILLS [phone], Disp: 90 tablet, Rfl:  3   Melatonin 10 MG TABS, Take 10 mg by mouth daily as needed (sleep)., Disp: , Rfl:    mirtazapine (REMERON) 15 MG tablet,  Take 15 mg by mouth at bedtime., Disp: , Rfl:    omeprazole (PRILOSEC OTC) 20 MG tablet, Take 20 mg by mouth daily., Disp: , Rfl:    Potassium Chloride ER 20 MEQ TBCR, TAKE 2 TABLETS BY MOUTH EVERY DAY, Disp: 180 tablet, Rfl: 3   spironolactone (ALDACTONE) 25 MG tablet, TAKE 1 TABLET (25 MG TOTAL) BY MOUTH DAILY., Disp: 90 tablet, Rfl: 0      Objective:   Vitals:   03/26/22 0945  BP: 128/70  Pulse: 84  Temp: 98.4 F (36.9 C)  TempSrc: Oral  SpO2: 98%  Weight: 253 lb 9.6 oz (115 kg)  Height: '6\' 4"'$  (1.93 m)    Estimated body mass index is 30.87 kg/m as calculated from the following:   Height as of this encounter: '6\' 4"'$  (1.93 m).   Weight as of this encounter: 253 lb 9.6 oz (115 kg).  '@WEIGHTCHANGE'$ @  [employer]   03/26/22 0945  Weight: 253 lb 9.6 oz (115 kg)     Physical Exam    General: No distress. Looks well. MIld perorbital edema Neuro: Alert and Oriented x 3. GCS 15. Speech normal Psych: Pleasant Resp:  Barrel Chest - PECTRUS.  Wheeze - no, Crackles - no, No overt respiratory distress CVS: Normal heart sounds. Murmurs - no Ext: Stigmata of Connective Tissue Disease - no HEENT: Normal upper airway. PEERL +. No post nasal drip        Assessment:       ICD-10-CM   1. Chronic respiratory failure with hypoxia and hypercapnia (HCC)  J96.11    J96.12     2. Pectus excavatum  Q67.6     3. Restrictive lung disease  J98.4     4. Airway obstruction, anatomic  J98.8          Plan:     Patient Instructions     ICD-10-CM   1. Chronic respiratory failure with hypoxia and hypercapnia (HCC)  J96.11    J96.12     2. Pectus excavatum  Q67.6     3. Restrictive lung disease  J98.4     4. Airway obstruction, anatomic  J98.8      You absolutely require noninvasive positive pressure ventilation at night.  -This is in view of your chronic hypercapnic respiratory failure and obstruction of the airways leading into the lung because of his severe restrictive  chest disease and pectus excavatum that is failed surgery  Starting noninvasive positive pressure ventilation few years ago is what has kept out of the hospital  I am worried that you are slowly declining  You definitely require ongoing continued noninvasive positive pressure ventilation at night  [facility] virtual appointment today -Check arterial blood gas  - (can do this at [facility] or [place] with the [facility] -Check spirometry and DLCO in the next few weeks  -Can be done at [facility] or [place] at the [facility] system  Followup  - Virtual visit or face-to-face visit with Patricia Nettle in the next 2 weeks  -Based on the above we will do letter to the DME company requiring you to continue to use noninvasive positive pressure ventilation  ( Level 05 visit: Estb 40-54 min   visit type: on-site physical face to visit  in total care time and counseling  or/and coordination of care by this undersigned MD - Dr Brand Males. This includes one or more of the following on this same day 03/26/2022: pre-charting, chart review, note writing, documentation discussion of test results, diagnostic or treatment recommendations, prognosis, risks and benefits of management options, instructions, education, compliance or risk-factor reduction. It excludes time spent by the [place] or office staff in the care of the patient. Actual time 40 min)   SIGNATURE    Dr. Brand Males, M.D., F.C.C.P,  Pulmonary and Critical Care Medicine Staff Physician, [place] Director - Interstitial Lung Disease  Program  [facility] [place] [facility] [place], [place], [address]  Pager: [phone], If no answer or between  15:00h - 7:00h: call [phone] Telephone: [phone]  10:19 AM 03/26/2022

## 2022-03-26 NOTE — Patient Instructions (Addendum)
ICD-10-CM   1. Chronic respiratory failure with hypoxia and hypercapnia (HCC)  J96.11    J96.12     2. Pectus excavatum  Q67.6     3. Restrictive lung disease  J98.4     4. Airway obstruction, anatomic  J98.8      You absolutely require noninvasive positive pressure ventilation at night.  -This is in view of your chronic hypercapnic respiratory failure and obstruction of the airways leading into the lung because of his severe restrictive chest disease and pectus excavatum that is failed surgery  Starting noninvasive positive pressure ventilation few years ago is what has kept out of the hospital  I am worried that you are slowly declining  You definitely require ongoing continued noninvasive positive pressure ventilation at night  [name] virtual appointment today -Check arterial blood gas next 1-2 weeks - (can do this at [facility] or [place] with the [facility] -I do not want delays in getting this test done -Check spirometry and DLCO in the next 1-2 weeks  -Can be done at [facility] or [place] at the [place] system  -I do not want delays in getting this test done  Followup  - Virtual visit or face-to-face visit with Patricia Nettle in the 1- 2 weeks  -  -Based on the above we will do letter to the DME company requiring you to continue to use noninvasive positive pressure ventilation

## 2022-03-29 ENCOUNTER — Telehealth: Payer: Self-pay | Admitting: Internal Medicine

## 2022-03-29 NOTE — Telephone Encounter (Signed)
Received the following message from patient:  "I just got call from [place] t and they can't get me in the office until 8/2. Also can you just look at the pft from April?  Is there anywhere else I can get the blood gas done before this appointment so it will be worthwhile.    Thank you    [name]"  I asked the patient if he would be willing to have it done here in [place] or if he would like for [place] to check to see if it could be done at [facility]. He responded that he would like to see if it could be done at [facility].   MR, please advise if you are ok with [place] placing the order for [facility] if possible. Thanks!

## 2022-04-01 NOTE — Telephone Encounter (Signed)
Called patient unable to lvm. Will try again

## 2022-04-01 NOTE — Telephone Encounter (Signed)
I prefer [facility] but if he cannot come this far then ok for [facility]

## 2022-04-03 ENCOUNTER — Other Ambulatory Visit: Payer: Self-pay | Admitting: *Deleted

## 2022-04-03 ENCOUNTER — Telehealth: Payer: Self-pay | Admitting: Internal Medicine

## 2022-04-03 DIAGNOSIS — J9611 Chronic respiratory failure with hypoxia: Secondary | ICD-10-CM

## 2022-04-03 NOTE — Telephone Encounter (Signed)
I called the lab at [facility] and verified that a copy of the labs would need to be faxed. I have faxed the ordered. The confirmation was received and patient was aware.Nothing further needed.

## 2022-04-03 NOTE — Telephone Encounter (Signed)
Patient called back regarding the blood gas that he needs drawn prior to his OV with TP on 7/31.  There is no availability in [place] at the hospital there to draw it until 8/2.  Advised I would call [facility] to see if they could draw it prior to 7/31.  I spoke with Hailey in [place] regarding ABG.  She stated that they do ABGs  M-F 7am-3:30 pm.  He can call [phone].  Called patient and provided above information.  He verbalized understanding.  Order updated.  Nothing further needed.

## 2022-04-04 ENCOUNTER — Encounter: Payer: Self-pay | Admitting: Internal Medicine

## 2022-04-04 ENCOUNTER — Telehealth: Payer: Self-pay | Admitting: Internal Medicine

## 2022-04-04 NOTE — Telephone Encounter (Signed)
  Respect his desire not to go to the [place]  He is usually hospitliasez when is > 100. He needs triloogy wheich the insurance wants to take away. His pectus is getting worse. When he sees Tammy 7/231/23-he can decide on elective admission versus time he can write a letter to maintain his trilogy or both.  In between if he gets worse he will have to go to the ER.  There is a possibility that if he takes any sedative or gets an infection he can dramatically decline.    Latest Reference Range & Units 02/15/09 09:32 04/21/19 19:50 04/22/19 12:47 04/24/19 11:40 04/25/19 07:19 04/26/19 05:26 04/26/19 08:05 04/27/19 08:35 05/14/19 14:54 08/03/19 12:55 10/25/19 14:35  pH, Arterial 7.350 - 7.450  7.460 (H) 7.315 (L) 7.348 (L) 7.228 (L) 7.193 (LL) 7.181 (LL) 7.231 (L) 7.316 (L) 7.351 7.289 (L) 7.328 (L)  pCO2 arterial 32.0 - 48.0 mmHg 58.4 (HH) 110 (HH) 84.6 (HH) 105 (HH) 110 (HH) 119 (HH) 103 (HH) 88.1 (HH) 59.1 (H) 85.0 (HH) 81.2 (HH)  pO2, Arterial 83.0 - 108.0 mmHg 51.0 (L) 95.5 75.4 (L) 112 (H) 74.2 (L) 85.7 91.4 95.3 86.8 116 (H) 76.3 (L)  (LL): Data is critically low (HH): Data is critically high (H): Data is abnormally high (L): Data is abnormally low

## 2022-04-04 NOTE — Telephone Encounter (Signed)
Called and spoke with Christian at [facility]. She called with critical ABG results. The results are below:  pH: 7.30 CO2: 97 (confirmed on 6L of O2) PO2: 132 Bicarb: 47.5  She did confirm that the patient was on 6L of O2. They did try to advise him to go to the ED but he stated that his breathing was at baseline.   MR, can you please advise? Thanks!

## 2022-04-04 NOTE — Telephone Encounter (Signed)
Spoke with pt and reviewed Dr. [name]. Pt stated understanding and stated if he had a decline he would go to the ED for evaluation. Nothing further needed at this time.

## 2022-04-05 NOTE — Telephone Encounter (Signed)
Dr. Silas Flood please advise on the following My Chart message as Dr. [name] is currently unavailable.

## 2022-04-05 NOTE — Telephone Encounter (Signed)
Blood gas is similar to recent values. There can be fluctuations from time to time in the CO2 and pH but overall the numbers are similar to recent values in our system dating back to 2021. It is presumed that the issue is largely related to the pectus excavatum. I do not have a solution to this. Other than to stress it is imperative that you use the vent any time you are sleeping - at night, napping, or otherwise. And it is imperative you avoid any sedating medications such as pain killers, opiates, benzodiazepines for anxiety, etc. See telephone note from Dr. [name] yesterday with similar advice.

## 2022-04-08 ENCOUNTER — Ambulatory Visit (INDEPENDENT_AMBULATORY_CARE_PROVIDER_SITE_OTHER): Payer: Medicare PPO | Admitting: Adult Health

## 2022-04-08 ENCOUNTER — Encounter: Payer: Self-pay | Admitting: Adult Health

## 2022-04-08 DIAGNOSIS — J984 Other disorders of lung: Secondary | ICD-10-CM | POA: Diagnosis not present

## 2022-04-08 DIAGNOSIS — J4540 Moderate persistent asthma, uncomplicated: Secondary | ICD-10-CM

## 2022-04-08 DIAGNOSIS — J9611 Chronic respiratory failure with hypoxia: Secondary | ICD-10-CM

## 2022-04-08 DIAGNOSIS — I5032 Chronic diastolic (congestive) heart failure: Secondary | ICD-10-CM

## 2022-04-08 DIAGNOSIS — J9612 Chronic respiratory failure with hypercapnia: Secondary | ICD-10-CM

## 2022-04-08 NOTE — Progress Notes (Signed)
$'@Patient'J$  ID: [name], male    DOB: 12-Jun-1981, 41 y.o.   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  Patient is on continuous oxygen 5 L and noninvasive ventilator at bedtime has underwent pectus reconstruction x2. Medical history significant for diastolic heart failure   TEST/EVENTS :  RV size is mildly enlarged.,  Right atrium mildly and left largest   PFTs December 2018 FEV1 15%, ratio 71, FVC 60%, no significant bronchodilator response, DLCO 54%   CT chest April 03, 2021 showed stable moderate to marked severity of bilateral consolidation atelectasis, 7 mm right lower lobe nodule mild pectus excavatum deformity with subsequent mass effect on the heart   FENO 18ppb    CT chest 12/11/2021 care everywhere, proximal left main bronchus severely compressed by diffusely enlarged pulmonary artery and overall pectus physiology, solid pulmonary nodules are stable measuring 7 mm in the right lower lobe, lower lung predominant mild interstitial edema,.   2D echo December 11, 2021 shows EF of 55%, normal right ventricular chamber size, normal systolic function.  04/08/2022 Follow up ; restrictive lung disease secondary to pectus deformity, chronic hypercarbic and hypoxic respiratory failure, asthma Patient presents for a 2-week follow-up.  Patient is followed for severe restrictive lung disease secondary to pectus deformity complicated by chronic hypoxic and hypercarbic respiratory failure and asthma.  Patient continues to have ongoing significant shortness of breath with minimum activity.  Patient is very high risk for worsening acute on chronic hypercarbic respiratory failure.  We have had to increase his use of noninvasive ventilation during the daytime for at  least 2 hours to try to help with his chronic hypercarbia and progressive respiratory decline.  He wears his trilogy device all night long for at least 8-9 or more hours.  Patient's insurance wants him to switch from noninvasive support to BiPAP which would absolutely be detrimental to his health.  Since last visit patient says he is doing about the same.  He is short of breath at rest.  Does get winded with heavy activity.  Activity tolerance is decreasing.  He denies any hemoptysis, chest pain, orthopnea.  Allergies  Allergen Reactions   Other Itching and Other (See Comments)    Pet Dander    Immunization History  Administered Date(s) Administered   Influenza Split 06/16/2012, 06/27/2012, 06/08/2013, 06/09/2014, 06/28/2014, 08/10/2015, 05/28/2018, 05/11/2019   Influenza Whole 06/09/2010, 06/10/2011   Influenza,inj,Quad PF,6+ Mos 06/08/2013, 08/10/2015, 05/28/2018, 05/11/2019   Influenza,inj,Quad PF,6-35 Mos 05/29/2020   Influenza,inj,quad, With Preservative 06/08/2013, 08/10/2015   Influenza-Unspecified 06/16/2012, 06/08/2013, 06/09/2014, 08/10/2015, 05/28/2018, 05/11/2019, 05/22/2020   Moderna Sars-Covid-2 Vaccination 11/26/2019, 12/27/2019   Tdap 09/10/2009    Past Medical History:  Diagnosis Date   Asthma    CHF (congestive heart failure) (HCC)    Chronic hypercapnic respiratory failure (HCC)    nocturnal  hypoxemic resp failure, on bipap   Pectus excavatum    Restrictive lung disease     Tobacco History: Social History   Tobacco Use  Smoking Status Former   Packs/day: 0.10   Years: 1.00   Total pack years: 0.10   Types: Cigarettes   Quit date: 09/09/2006   Years since quitting: 15.5  Smokeless Tobacco Never   Counseling given: Not Answered   Outpatient Medications Prior to Visit  Medication Sig Dispense  Refill   acetaminophen (TYLENOL) 500 MG tablet Take 1,000 mg by mouth every 6 (six) hours as needed for mild pain.     albuterol (PROAIR HFA) 108 (90 Base)  MCG/ACT inhaler Inhale 2 puffs into the lungs every 6 (six) hours as needed for wheezing or shortness of breath. 8.5 each 6   albuterol (PROVENTIL) (2.5 MG/3ML) 0.083% nebulizer solution Take 3 mLs (2.5 mg total) by nebulization every 6 (six) hours as needed for wheezing or shortness of breath. 75 mL 5   bisoprolol (ZEBETA) 5 MG tablet TAKE 1 TABLET BY MOUTH EVERY DAY 90 tablet 3   busPIRone (BUSPAR) 15 MG tablet Take 15 mg by mouth 2 (two) times daily.     cetirizine (ZYRTEC) 10 MG tablet Take 1 tablet (10 mg total) by mouth daily. 30 tablet 2   fluticasone (FLONASE) 50 MCG/ACT nasal spray Place 2 sprays into the nose as needed.     furosemide (LASIX) 20 MG tablet TAKE 2 TABLETS (40 MG TOTAL) BY MOUTH EVERY MORNING AND 1 TABLET (20 MG TOTAL) EVERY EVENING. 270 tablet 0   isosorbide mononitrate (IMDUR) 30 MG 24 hr tablet TAKE 1 TABLET (30 MG TOTAL) BY MOUTH DAILY. NEED APPT FOR FUTURE REFILLS [phone] 90 tablet 3   Melatonin 10 MG TABS Take 10 mg by mouth daily as needed (sleep).     mirtazapine (REMERON) 15 MG tablet Take 15 mg by mouth at bedtime.     omeprazole (PRILOSEC OTC) 20 MG tablet Take 20 mg by mouth daily.     Potassium Chloride ER 20 MEQ TBCR TAKE 2 TABLETS BY MOUTH EVERY DAY 180 tablet 3   spironolactone (ALDACTONE) 25 MG tablet TAKE 1 TABLET (25 MG TOTAL) BY MOUTH DAILY. 90 tablet 0   No facility-administered medications prior to visit.     Review of Systems:   Constitutional:   No  weight loss, night sweats,  Fevers, chills, + fatigue, or  lassitude.  HEENT:   No headaches,  Difficulty swallowing,  Tooth/dental problems, or  Sore throat,                No sneezing, itching, ear ache, nasal congestion, post nasal drip,   CV:  No chest pain,  Orthopnea, PND, swelling in lower extremities, anasarca, dizziness, palpitations, syncope.   GI  No heartburn, indigestion, abdominal pain, nausea, vomiting, diarrhea, change in bowel habits, loss of appetite, bloody stools.    Resp: .  No chest wall deformity  Skin: no rash or lesions.  GU: no dysuria, change in color of urine, no urgency or frequency.  No flank pain, no hematuria   MS:  No joint pain or swelling.  No decreased range of motion.  No back pain.    Physical Exam  BP 126/78 (BP Location: Left Arm, Patient Position: Sitting, Cuff Size: Normal)   Pulse 83   Temp 98.3 F (36.8 C) (Oral)   Ht '6\' 4"'$  (1.93 m)   Wt 251 lb 9.6 oz (114.1 kg)   SpO2 95% Comment: O2  BMI 30.63 kg/m   GEN: A/Ox3; pleasant , NAD, well nourished    HEENT:  Pray/AT,  NOSE-clear, THROAT-clear, no lesions, no postnasal drip or exudate noted.   NECK:  Supple w/ fair ROM; no JVD; normal carotid impulses w/o bruits; no thyromegaly or nodules palpated; no lymphadenopathy.    RESP diminished breath sounds in the bases.  W/o, wheezes/ rales/ or rhonchi. no accessory muscle use, no dullness to percussion, pectus deformity  noted  CARD:  RRR, no m/r/g, tr peripheral edema, pulses intact, no cyanosis or clubbing.  GI:   Soft & nt; nml bowel sounds; no organomegaly or masses detected.   Musco: Warm bil, no deformities or joint swelling noted.   Neuro: alert, no focal deficits noted.    Skin: Warm, no lesions or rashes    Lab Results:  CBC   BMET     Imaging: No results found.       Latest Ref Rng & Units 08/21/2017    4:03 PM  PFT Results  FVC-Pre L 1.02   FVC-Predicted Pre % 16   FVC-Post L 1.05   FVC-Predicted Post % 16   Pre FEV1/FVC % % 72   Post FEV1/FCV % % 71   FEV1-Pre L 0.73   FEV1-Predicted Pre % 14   FEV1-Post L 0.74   DLCO uncorrected ml/min/mmHg 21.18   DLCO UNC% % 54   DLCO corrected ml/min/mmHg 21.75   DLCO COR %Predicted % 55   DLVA Predicted % 163   TLC L 2.81   TLC % Predicted % 35   RV % Predicted % 83     No results found for: "NITRICOXIDE"      Assessment & Plan:   No problem-specific Assessment & Plan notes found for this encounter.     Rexene Edison,  NP 04/08/2022

## 2022-04-08 NOTE — Patient Instructions (Addendum)
Albuterol inhaler or neb As needed   Continue on Lasix and aldactone  Continue on TRILOGY vent At bedtime  and naps. And as needed. Need to wear for at least 2 hr in evening  Continue on Oxygen 5l/m.  Activity as tolerated.  Follow up with Dr. [name] in 3 months  and As needed   Please contact office for sooner follow up if symptoms do not improve or worsen or seek emergency care

## 2022-04-12 NOTE — Assessment & Plan Note (Signed)
Patient is continue on oxygen to maintain O2 saturations greater than 88 to 90%.  He is to continue on noninvasive support while sleeping.  Also have asked him to use this during the daytime to help with his chronic hypercarbia  Plan  Patient Instructions  Albuterol inhaler or neb As needed   Continue on Lasix and aldactone  Continue on TRILOGY vent At bedtime  and naps. And as needed. Need to wear for at least 2 hr in evening  Continue on Oxygen 5l/m.  Activity as tolerated.  Follow up with Dr. [name] in 3 months  and As needed   Please contact office for sooner follow up if symptoms do not improve or worsen or seek emergency care

## 2022-04-12 NOTE — Assessment & Plan Note (Signed)
Appears compensated without overt volume overload on exam.  Continue on diuretics

## 2022-04-12 NOTE — Assessment & Plan Note (Signed)
Severe restrictive lung disease secondary to pectus deformity-patient is continue on trilogy noninvasive vent at bedtime, with naps and at least 2 hours during the daytime to help with chronic hypoxic and hypercarbic respiratory failure  Plan  Patient Instructions  Albuterol inhaler or neb As needed   Continue on Lasix and aldactone  Continue on TRILOGY vent At bedtime  and naps. And as needed. Need to wear for at least 2 hr in evening  Continue on Oxygen 5l/m.  Activity as tolerated.  Follow up with Dr. [name] in 3 months  and As needed   Please contact office for sooner follow up if symptoms do not improve or worsen or seek emergency care

## 2022-04-12 NOTE — Assessment & Plan Note (Signed)
Recent flare now resolved.  Continue on current regimen

## 2022-04-17 ENCOUNTER — Other Ambulatory Visit (HOSPITAL_COMMUNITY): Payer: Self-pay | Admitting: Internal Medicine

## 2022-04-17 ENCOUNTER — Encounter (INDEPENDENT_AMBULATORY_CARE_PROVIDER_SITE_OTHER): Payer: Self-pay

## 2022-04-17 DIAGNOSIS — I509 Heart failure, unspecified: Secondary | ICD-10-CM

## 2022-05-10 DEATH — deceased
